# Patient Record
Sex: Female | Born: 1950
Health system: Southern US, Community
[De-identification: ages and names within clinical notes are randomized; demographics above are authoritative.]

## PROBLEM LIST (undated history)

## (undated) DIAGNOSIS — M810 Age-related osteoporosis without current pathological fracture: Secondary | ICD-10-CM

## (undated) DIAGNOSIS — M7740 Metatarsalgia, unspecified foot: Secondary | ICD-10-CM

## (undated) DIAGNOSIS — K219 Gastro-esophageal reflux disease without esophagitis: Secondary | ICD-10-CM

## (undated) DIAGNOSIS — M25559 Pain in unspecified hip: Secondary | ICD-10-CM

## (undated) DIAGNOSIS — G4733 Obstructive sleep apnea (adult) (pediatric): Secondary | ICD-10-CM

## (undated) DIAGNOSIS — Z9889 Other specified postprocedural states: Secondary | ICD-10-CM

## (undated) DIAGNOSIS — F32A Depression, unspecified: Secondary | ICD-10-CM

## (undated) DIAGNOSIS — F329 Major depressive disorder, single episode, unspecified: Secondary | ICD-10-CM

## (undated) DIAGNOSIS — L570 Actinic keratosis: Secondary | ICD-10-CM

## (undated) DIAGNOSIS — R0902 Hypoxemia: Secondary | ICD-10-CM

## (undated) DIAGNOSIS — F341 Dysthymic disorder: Secondary | ICD-10-CM

## (undated) DIAGNOSIS — R21 Rash and other nonspecific skin eruption: Secondary | ICD-10-CM

## (undated) DIAGNOSIS — Z9114 Patient's other noncompliance with medication regimen: Secondary | ICD-10-CM

## (undated) DIAGNOSIS — M25562 Pain in left knee: Secondary | ICD-10-CM

## (undated) DIAGNOSIS — M1990 Unspecified osteoarthritis, unspecified site: Secondary | ICD-10-CM

## (undated) DIAGNOSIS — M21612 Bunion of left foot: Secondary | ICD-10-CM

## (undated) DIAGNOSIS — R269 Unspecified abnormalities of gait and mobility: Secondary | ICD-10-CM

## (undated) DIAGNOSIS — M25561 Pain in right knee: Secondary | ICD-10-CM

## (undated) DIAGNOSIS — I499 Cardiac arrhythmia, unspecified: Secondary | ICD-10-CM

## (undated) DIAGNOSIS — I471 Supraventricular tachycardia: Secondary | ICD-10-CM

## (undated) DIAGNOSIS — F419 Anxiety disorder, unspecified: Secondary | ICD-10-CM

## (undated) DIAGNOSIS — C73 Malignant neoplasm of thyroid gland: Secondary | ICD-10-CM

## (undated) DIAGNOSIS — Z0000 Encounter for general adult medical examination without abnormal findings: Secondary | ICD-10-CM

## (undated) DIAGNOSIS — M5126 Other intervertebral disc displacement, lumbar region: Secondary | ICD-10-CM

## (undated) DIAGNOSIS — E079 Disorder of thyroid, unspecified: Secondary | ICD-10-CM

## (undated) DIAGNOSIS — R05 Cough: Secondary | ICD-10-CM

## (undated) DIAGNOSIS — C801 Malignant (primary) neoplasm, unspecified: Secondary | ICD-10-CM

## (undated) DIAGNOSIS — J309 Allergic rhinitis, unspecified: Secondary | ICD-10-CM

## (undated) DIAGNOSIS — R0683 Snoring: Secondary | ICD-10-CM

## (undated) DIAGNOSIS — Z833 Family history of diabetes mellitus: Secondary | ICD-10-CM

## (undated) DIAGNOSIS — R4189 Other symptoms and signs involving cognitive functions and awareness: Secondary | ICD-10-CM

## (undated) DIAGNOSIS — M999 Biomechanical lesion, unspecified: Secondary | ICD-10-CM

## (undated) DIAGNOSIS — Z9989 Dependence on other enabling machines and devices: Secondary | ICD-10-CM

## (undated) DIAGNOSIS — R112 Nausea with vomiting, unspecified: Secondary | ICD-10-CM

## (undated) DIAGNOSIS — T4145XA Adverse effect of unspecified anesthetic, initial encounter: Secondary | ICD-10-CM

## (undated) DIAGNOSIS — G8929 Other chronic pain: Secondary | ICD-10-CM

## (undated) DIAGNOSIS — R51 Headache: Secondary | ICD-10-CM

## (undated) DIAGNOSIS — L700 Acne vulgaris: Secondary | ICD-10-CM

## (undated) DIAGNOSIS — I498 Other specified cardiac arrhythmias: Secondary | ICD-10-CM

## (undated) DIAGNOSIS — M797 Fibromyalgia: Secondary | ICD-10-CM

## (undated) DIAGNOSIS — R519 Headache, unspecified: Secondary | ICD-10-CM

## (undated) DIAGNOSIS — R413 Other amnesia: Secondary | ICD-10-CM

## (undated) DIAGNOSIS — M87059 Idiopathic aseptic necrosis of unspecified femur: Secondary | ICD-10-CM

## (undated) DIAGNOSIS — R351 Nocturia: Secondary | ICD-10-CM

## (undated) DIAGNOSIS — T8859XA Other complications of anesthesia, initial encounter: Secondary | ICD-10-CM

## (undated) DIAGNOSIS — M21611 Bunion of right foot: Secondary | ICD-10-CM

## (undated) DIAGNOSIS — G4730 Sleep apnea, unspecified: Secondary | ICD-10-CM

## (undated) HISTORY — DX: Age-related osteoporosis without current pathological fracture: M81.0

## (undated) HISTORY — DX: Sleep apnea, unspecified: G47.30

## (undated) HISTORY — DX: Pain in left knee: M25.562

## (undated) HISTORY — DX: Bunion of left foot: M21.612

## (undated) HISTORY — PX: CATARACT EXTRACTION: SUR2

## (undated) HISTORY — DX: Malignant neoplasm of thyroid gland: C73

## (undated) HISTORY — DX: Obstructive sleep apnea (adult) (pediatric): G47.33

## (undated) HISTORY — PX: ABDOMINAL HYSTERECTOMY: SHX81

## (undated) HISTORY — DX: Other symptoms and signs involving cognitive functions and awareness: R41.89

## (undated) HISTORY — DX: Anxiety disorder, unspecified: F41.9

## (undated) HISTORY — DX: Biomechanical lesion, unspecified: M99.9

## (undated) HISTORY — DX: Hypoxemia: R09.02

## (undated) HISTORY — DX: Malignant (primary) neoplasm, unspecified: C80.1

## (undated) HISTORY — DX: Bunion of right foot: M21.611

## (undated) HISTORY — DX: Actinic keratosis: L57.0

## (undated) HISTORY — DX: Cough: R05

## (undated) HISTORY — DX: Idiopathic aseptic necrosis of unspecified femur: M87.059

## (undated) HISTORY — DX: Disorder of thyroid, unspecified: E07.9

## (undated) HISTORY — DX: Fibromyalgia: M79.7

## (undated) HISTORY — DX: Headache: R51

## (undated) HISTORY — DX: Nocturia: R35.1

## (undated) HISTORY — DX: Unspecified osteoarthritis, unspecified site: M19.90

## (undated) HISTORY — DX: Other specified postprocedural states: Z98.890

## (undated) HISTORY — DX: Pain in unspecified hip: M25.559

## (undated) HISTORY — DX: Major depressive disorder, single episode, unspecified: F32.9

## (undated) HISTORY — DX: Other amnesia: R41.3

## (undated) HISTORY — DX: Pain in right knee: M25.561

## (undated) HISTORY — DX: Depression, unspecified: F32.A

## (undated) HISTORY — PX: TOE SURGERY: SHX1073

## (undated) HISTORY — DX: Rash and other nonspecific skin eruption: R21

## (undated) HISTORY — PX: TOTAL HIP ARTHROPLASTY: SHX124

## (undated) HISTORY — DX: Patient's other noncompliance with medication regimen: Z91.14

## (undated) HISTORY — PX: CHOLECYSTECTOMY: SHX55

## (undated) HISTORY — DX: Encounter for general adult medical examination without abnormal findings: Z00.00

## (undated) HISTORY — DX: Dependence on other enabling machines and devices: Z99.89

## (undated) HISTORY — DX: Acne vulgaris: L70.0

## (undated) HISTORY — DX: Snoring: R06.83

## (undated) HISTORY — DX: Other chronic pain: G89.29

## (undated) HISTORY — DX: Dysthymic disorder: F34.1

## (undated) HISTORY — DX: Gastro-esophageal reflux disease without esophagitis: K21.9

## (undated) HISTORY — DX: Unspecified abnormalities of gait and mobility: R26.9

## (undated) HISTORY — PX: TUBAL LIGATION: SHX77

## (undated) HISTORY — DX: Headache, unspecified: R51.9

## (undated) HISTORY — PX: BREAST EXCISIONAL BIOPSY: SUR124

## (undated) HISTORY — DX: Other specified cardiac arrhythmias: I49.8

## (undated) HISTORY — DX: Nausea with vomiting, unspecified: R11.2

## (undated) HISTORY — DX: Supraventricular tachycardia: I47.1

## (undated) HISTORY — DX: Metatarsalgia, unspecified foot: M77.40

## (undated) HISTORY — DX: Other intervertebral disc displacement, lumbar region: M51.26

## (undated) HISTORY — DX: Family history of diabetes mellitus: Z83.3

## (undated) HISTORY — DX: Morbid (severe) obesity due to excess calories: E66.01

## (undated) HISTORY — DX: Allergic rhinitis, unspecified: J30.9

---

## 1998-08-25 ENCOUNTER — Ambulatory Visit (HOSPITAL_BASED_OUTPATIENT_CLINIC_OR_DEPARTMENT_OTHER): Admission: RE | Admit: 1998-08-25 | Discharge: 1998-08-25 | Payer: Self-pay | Admitting: Surgery

## 1998-08-25 ENCOUNTER — Encounter: Payer: Self-pay | Admitting: Surgery

## 1999-06-21 ENCOUNTER — Ambulatory Visit (HOSPITAL_BASED_OUTPATIENT_CLINIC_OR_DEPARTMENT_OTHER): Admission: RE | Admit: 1999-06-21 | Discharge: 1999-06-21 | Payer: Self-pay | Admitting: Orthopaedic Surgery

## 2001-09-14 ENCOUNTER — Other Ambulatory Visit: Admission: RE | Admit: 2001-09-14 | Discharge: 2001-09-14 | Payer: Self-pay | Admitting: *Deleted

## 2006-01-30 ENCOUNTER — Ambulatory Visit: Payer: Self-pay | Admitting: Family Medicine

## 2006-03-10 ENCOUNTER — Encounter: Admission: RE | Admit: 2006-03-10 | Discharge: 2006-03-10 | Payer: Self-pay | Admitting: Sports Medicine

## 2006-03-18 ENCOUNTER — Ambulatory Visit: Payer: Self-pay | Admitting: Family Medicine

## 2006-08-26 ENCOUNTER — Ambulatory Visit: Payer: Self-pay | Admitting: Sports Medicine

## 2006-10-16 ENCOUNTER — Ambulatory Visit: Payer: Self-pay | Admitting: Sports Medicine

## 2007-03-03 ENCOUNTER — Ambulatory Visit: Payer: Self-pay | Admitting: Sports Medicine

## 2007-04-10 ENCOUNTER — Encounter: Payer: Self-pay | Admitting: Internal Medicine

## 2007-04-15 ENCOUNTER — Encounter: Payer: Self-pay | Admitting: Internal Medicine

## 2007-04-30 ENCOUNTER — Encounter: Payer: Self-pay | Admitting: Internal Medicine

## 2007-05-28 ENCOUNTER — Encounter: Payer: Self-pay | Admitting: Family Medicine

## 2007-07-14 ENCOUNTER — Ambulatory Visit: Payer: Self-pay | Admitting: Family Medicine

## 2007-07-14 DIAGNOSIS — K219 Gastro-esophageal reflux disease without esophagitis: Secondary | ICD-10-CM

## 2007-07-14 DIAGNOSIS — IMO0001 Reserved for inherently not codable concepts without codable children: Secondary | ICD-10-CM | POA: Insufficient documentation

## 2007-07-14 HISTORY — DX: Gastro-esophageal reflux disease without esophagitis: K21.9

## 2007-07-16 LAB — CONVERTED CEMR LAB
ALT: 26 units/L (ref 0–35)
AST: 26 units/L (ref 0–37)
Albumin: 4.2 g/dL (ref 3.5–5.2)
Alkaline Phosphatase: 63 units/L (ref 39–117)
BUN: 13 mg/dL (ref 6–23)
CO2: 25 meq/L (ref 19–32)
Calcium: 8.9 mg/dL (ref 8.4–10.5)
Chloride: 104 meq/L (ref 96–112)
Creatinine, Ser: 0.69 mg/dL (ref 0.40–1.20)
Digitoxin Lvl: 0.9 ng/mL (ref 0.8–2.0)
Glucose, Bld: 97 mg/dL (ref 70–99)
HCT: 43.5 % (ref 36.0–46.0)
Hemoglobin: 13.7 g/dL (ref 12.0–15.0)
MCHC: 31.5 g/dL (ref 30.0–36.0)
MCV: 95 fL (ref 78.0–100.0)
Platelets: 304 10*3/uL (ref 150–400)
Potassium: 4.1 meq/L (ref 3.5–5.3)
RBC: 4.58 M/uL (ref 3.87–5.11)
RDW: 14.2 % — ABNORMAL HIGH (ref 11.5–14.0)
Sed Rate: 8 mm/hr (ref 0–22)
Sodium: 140 meq/L (ref 135–145)
TSH: 1.893 microintl units/mL (ref 0.350–5.50)
Total Bilirubin: 0.5 mg/dL (ref 0.3–1.2)
Total Protein: 6.9 g/dL (ref 6.0–8.3)
Vitamin B-12: 647 pg/mL (ref 211–911)
WBC: 6.8 10*3/uL (ref 4.0–10.5)

## 2007-08-03 ENCOUNTER — Encounter: Payer: Self-pay | Admitting: Family Medicine

## 2007-08-11 ENCOUNTER — Encounter (INDEPENDENT_AMBULATORY_CARE_PROVIDER_SITE_OTHER): Payer: Self-pay | Admitting: *Deleted

## 2007-08-11 ENCOUNTER — Encounter: Admission: RE | Admit: 2007-08-11 | Discharge: 2007-08-11 | Payer: Self-pay | Admitting: Family Medicine

## 2007-08-11 DIAGNOSIS — M87059 Idiopathic aseptic necrosis of unspecified femur: Secondary | ICD-10-CM

## 2007-08-11 HISTORY — DX: Idiopathic aseptic necrosis of unspecified femur: M87.059

## 2007-08-13 ENCOUNTER — Encounter: Admission: RE | Admit: 2007-08-13 | Discharge: 2007-08-13 | Payer: Self-pay | Admitting: Sports Medicine

## 2007-08-14 ENCOUNTER — Encounter: Payer: Self-pay | Admitting: Family Medicine

## 2007-08-14 ENCOUNTER — Encounter: Admission: RE | Admit: 2007-08-14 | Discharge: 2007-08-14 | Payer: Self-pay | Admitting: Internal Medicine

## 2007-08-14 ENCOUNTER — Encounter (INDEPENDENT_AMBULATORY_CARE_PROVIDER_SITE_OTHER): Payer: Self-pay | Admitting: Internal Medicine

## 2007-09-08 ENCOUNTER — Ambulatory Visit: Payer: Self-pay | Admitting: Sports Medicine

## 2007-09-30 ENCOUNTER — Encounter: Payer: Self-pay | Admitting: Sports Medicine

## 2007-10-28 ENCOUNTER — Encounter: Payer: Self-pay | Admitting: Family Medicine

## 2007-11-06 ENCOUNTER — Encounter: Admission: RE | Admit: 2007-11-06 | Discharge: 2007-11-06 | Payer: Self-pay | Admitting: Family Medicine

## 2007-11-09 ENCOUNTER — Encounter: Payer: Self-pay | Admitting: Family Medicine

## 2007-12-22 ENCOUNTER — Ambulatory Visit: Payer: Self-pay | Admitting: Sports Medicine

## 2008-01-20 ENCOUNTER — Encounter: Payer: Self-pay | Admitting: *Deleted

## 2008-02-02 ENCOUNTER — Encounter: Payer: Self-pay | Admitting: Family Medicine

## 2008-02-17 ENCOUNTER — Encounter: Payer: Self-pay | Admitting: Family Medicine

## 2008-03-07 ENCOUNTER — Encounter: Payer: Self-pay | Admitting: Family Medicine

## 2008-03-17 ENCOUNTER — Encounter: Payer: Self-pay | Admitting: Family Medicine

## 2008-03-21 ENCOUNTER — Encounter: Payer: Self-pay | Admitting: Family Medicine

## 2008-04-04 ENCOUNTER — Encounter: Payer: Self-pay | Admitting: *Deleted

## 2008-04-11 ENCOUNTER — Ambulatory Visit (HOSPITAL_COMMUNITY): Admission: RE | Admit: 2008-04-11 | Discharge: 2008-04-11 | Payer: Self-pay | Admitting: Family Medicine

## 2008-04-12 ENCOUNTER — Encounter: Payer: Self-pay | Admitting: Family Medicine

## 2008-04-14 ENCOUNTER — Encounter: Payer: Self-pay | Admitting: Family Medicine

## 2008-04-20 ENCOUNTER — Ambulatory Visit (HOSPITAL_COMMUNITY): Admission: RE | Admit: 2008-04-20 | Discharge: 2008-04-20 | Payer: Self-pay | Admitting: Orthopedic Surgery

## 2008-06-21 ENCOUNTER — Encounter: Payer: Self-pay | Admitting: Family Medicine

## 2008-06-22 ENCOUNTER — Encounter: Payer: Self-pay | Admitting: Family Medicine

## 2008-06-27 ENCOUNTER — Inpatient Hospital Stay (HOSPITAL_COMMUNITY): Admission: RE | Admit: 2008-06-27 | Discharge: 2008-06-30 | Payer: Self-pay | Admitting: Orthopedic Surgery

## 2008-08-11 ENCOUNTER — Encounter: Admission: RE | Admit: 2008-08-11 | Discharge: 2008-08-11 | Payer: Self-pay | Admitting: Family Medicine

## 2008-09-19 ENCOUNTER — Encounter: Payer: Self-pay | Admitting: Family Medicine

## 2008-09-19 ENCOUNTER — Encounter: Admission: RE | Admit: 2008-09-19 | Discharge: 2008-09-19 | Payer: Self-pay | Admitting: Family Medicine

## 2008-10-04 ENCOUNTER — Ambulatory Visit: Payer: Self-pay | Admitting: Family Medicine

## 2008-10-13 ENCOUNTER — Encounter: Payer: Self-pay | Admitting: Family Medicine

## 2008-10-14 ENCOUNTER — Encounter: Payer: Self-pay | Admitting: *Deleted

## 2008-11-23 ENCOUNTER — Encounter: Payer: Self-pay | Admitting: Family Medicine

## 2009-01-30 ENCOUNTER — Ambulatory Visit: Payer: Self-pay | Admitting: Family Medicine

## 2009-01-30 DIAGNOSIS — J309 Allergic rhinitis, unspecified: Secondary | ICD-10-CM

## 2009-01-30 HISTORY — DX: Allergic rhinitis, unspecified: J30.9

## 2009-01-30 LAB — CONVERTED CEMR LAB
IgE (Immunoglobulin E), Serum: 58.9 intl units/mL (ref 0.0–180.0)
TSH: 0.624 microintl units/mL (ref 0.350–4.500)

## 2009-02-14 ENCOUNTER — Ambulatory Visit: Payer: Self-pay | Admitting: Family Medicine

## 2009-02-14 DIAGNOSIS — G4459 Other complicated headache syndrome: Secondary | ICD-10-CM | POA: Insufficient documentation

## 2009-02-17 ENCOUNTER — Ambulatory Visit (HOSPITAL_COMMUNITY): Admission: RE | Admit: 2009-02-17 | Discharge: 2009-02-17 | Payer: Self-pay | Admitting: Family Medicine

## 2009-02-21 ENCOUNTER — Encounter: Payer: Self-pay | Admitting: Family Medicine

## 2009-02-21 ENCOUNTER — Ambulatory Visit: Payer: Self-pay | Admitting: Family Medicine

## 2009-02-21 ENCOUNTER — Ambulatory Visit (HOSPITAL_COMMUNITY): Admission: RE | Admit: 2009-02-21 | Discharge: 2009-02-21 | Payer: Self-pay | Admitting: Family Medicine

## 2009-02-21 LAB — CONVERTED CEMR LAB
Basophils Absolute: 0 10*3/uL (ref 0.0–0.1)
Basophils Relative: 0 % (ref 0–1)
Eosinophils Absolute: 0.1 10*3/uL (ref 0.0–0.7)
Eosinophils Relative: 2 % (ref 0–5)
HCT: 37.3 % (ref 36.0–46.0)
Hemoglobin: 12.7 g/dL (ref 12.0–15.0)
Lymphocytes Relative: 28 % (ref 12–46)
Lymphs Abs: 1.4 10*3/uL (ref 0.7–4.0)
MCHC: 34 g/dL (ref 30.0–36.0)
MCV: 87.1 fL (ref 78.0–100.0)
Monocytes Absolute: 0.8 10*3/uL (ref 0.1–1.0)
Monocytes Relative: 17 % — ABNORMAL HIGH (ref 3–12)
Neutro Abs: 2.7 10*3/uL (ref 1.7–7.7)
Neutrophils Relative %: 53 % (ref 43–77)
Platelets: 231 10*3/uL (ref 150–400)
RBC: 4.28 M/uL (ref 3.87–5.11)
RDW: 14.4 % (ref 11.5–15.5)
WBC: 5.1 10*3/uL (ref 4.0–10.5)

## 2009-02-22 ENCOUNTER — Ambulatory Visit: Payer: Self-pay | Admitting: Family Medicine

## 2009-02-22 ENCOUNTER — Encounter: Payer: Self-pay | Admitting: Family Medicine

## 2009-02-22 ENCOUNTER — Observation Stay (HOSPITAL_COMMUNITY): Admission: AD | Admit: 2009-02-22 | Discharge: 2009-02-23 | Payer: Self-pay | Admitting: Family Medicine

## 2009-02-28 ENCOUNTER — Encounter: Payer: Self-pay | Admitting: *Deleted

## 2009-03-03 ENCOUNTER — Encounter: Payer: Self-pay | Admitting: Family Medicine

## 2009-03-08 ENCOUNTER — Ambulatory Visit (HOSPITAL_COMMUNITY): Admission: RE | Admit: 2009-03-08 | Discharge: 2009-03-08 | Payer: Self-pay | Admitting: Otolaryngology

## 2009-03-15 ENCOUNTER — Ambulatory Visit (HOSPITAL_COMMUNITY): Admission: RE | Admit: 2009-03-15 | Discharge: 2009-03-15 | Payer: Self-pay | Admitting: Otolaryngology

## 2009-03-15 ENCOUNTER — Encounter (INDEPENDENT_AMBULATORY_CARE_PROVIDER_SITE_OTHER): Payer: Self-pay | Admitting: Diagnostic Radiology

## 2009-04-11 ENCOUNTER — Encounter: Payer: Self-pay | Admitting: Family Medicine

## 2009-04-23 HISTORY — PX: THYROIDECTOMY: SHX17

## 2009-04-26 ENCOUNTER — Encounter (INDEPENDENT_AMBULATORY_CARE_PROVIDER_SITE_OTHER): Payer: Self-pay | Admitting: Otolaryngology

## 2009-04-26 ENCOUNTER — Ambulatory Visit (HOSPITAL_COMMUNITY): Admission: RE | Admit: 2009-04-26 | Discharge: 2009-04-26 | Payer: Self-pay | Admitting: Otolaryngology

## 2009-04-27 ENCOUNTER — Encounter: Payer: Self-pay | Admitting: Family Medicine

## 2009-05-03 ENCOUNTER — Encounter: Payer: Self-pay | Admitting: Family Medicine

## 2009-05-08 ENCOUNTER — Encounter: Payer: Self-pay | Admitting: Family Medicine

## 2009-05-11 ENCOUNTER — Ambulatory Visit (HOSPITAL_COMMUNITY): Admission: RE | Admit: 2009-05-11 | Discharge: 2009-05-12 | Payer: Self-pay | Admitting: Otolaryngology

## 2009-05-11 ENCOUNTER — Encounter (INDEPENDENT_AMBULATORY_CARE_PROVIDER_SITE_OTHER): Payer: Self-pay | Admitting: Otolaryngology

## 2009-05-16 ENCOUNTER — Encounter: Payer: Self-pay | Admitting: Family Medicine

## 2009-05-17 DIAGNOSIS — C73 Malignant neoplasm of thyroid gland: Secondary | ICD-10-CM

## 2009-05-17 HISTORY — DX: Malignant neoplasm of thyroid gland: C73

## 2009-05-19 ENCOUNTER — Encounter: Payer: Self-pay | Admitting: Family Medicine

## 2009-06-12 ENCOUNTER — Encounter: Payer: Self-pay | Admitting: Family Medicine

## 2009-06-14 ENCOUNTER — Encounter: Payer: Self-pay | Admitting: Family Medicine

## 2009-06-28 ENCOUNTER — Encounter (HOSPITAL_COMMUNITY): Admission: RE | Admit: 2009-06-28 | Discharge: 2009-09-19 | Payer: Self-pay | Admitting: Internal Medicine

## 2009-07-28 ENCOUNTER — Ambulatory Visit (HOSPITAL_COMMUNITY): Admission: RE | Admit: 2009-07-28 | Discharge: 2009-07-28 | Payer: Self-pay | Admitting: Orthopedic Surgery

## 2009-08-08 ENCOUNTER — Ambulatory Visit (HOSPITAL_COMMUNITY): Admission: RE | Admit: 2009-08-08 | Discharge: 2009-08-08 | Payer: Self-pay | Admitting: Orthopedic Surgery

## 2009-08-23 ENCOUNTER — Encounter: Admission: RE | Admit: 2009-08-23 | Discharge: 2009-08-23 | Payer: Self-pay | Admitting: Family Medicine

## 2009-08-24 ENCOUNTER — Encounter: Payer: Self-pay | Admitting: Family Medicine

## 2009-08-25 ENCOUNTER — Encounter: Payer: Self-pay | Admitting: Family Medicine

## 2009-08-28 ENCOUNTER — Encounter: Payer: Self-pay | Admitting: Family Medicine

## 2009-08-28 LAB — CONVERTED CEMR LAB: CRP, High Sensitivity: 3.2 — ABNORMAL HIGH

## 2009-09-14 ENCOUNTER — Ambulatory Visit: Payer: Self-pay | Admitting: Family Medicine

## 2009-09-14 LAB — CONVERTED CEMR LAB: Digitoxin Lvl: 1.1 ng/mL (ref 0.8–2.0)

## 2009-09-20 ENCOUNTER — Encounter: Payer: Self-pay | Admitting: Internal Medicine

## 2009-09-20 ENCOUNTER — Encounter: Payer: Self-pay | Admitting: Family Medicine

## 2009-09-20 ENCOUNTER — Ambulatory Visit: Payer: Self-pay | Admitting: Family Medicine

## 2009-09-20 ENCOUNTER — Ambulatory Visit (HOSPITAL_COMMUNITY): Admission: RE | Admit: 2009-09-20 | Discharge: 2009-09-20 | Payer: Self-pay | Admitting: Family Medicine

## 2009-09-20 LAB — CONVERTED CEMR LAB
ALT: 16 units/L (ref 0–35)
AST: 19 units/L (ref 0–37)
Albumin: 4.4 g/dL (ref 3.5–5.2)
Alkaline Phosphatase: 70 units/L (ref 39–117)
BUN: 15 mg/dL (ref 6–23)
CO2: 26 meq/L (ref 19–32)
Calcium: 8.5 mg/dL (ref 8.4–10.5)
Chloride: 104 meq/L (ref 96–112)
Creatinine, Ser: 0.83 mg/dL (ref 0.40–1.20)
Glucose, Bld: 115 mg/dL — ABNORMAL HIGH (ref 70–99)
Potassium: 4.8 meq/L (ref 3.5–5.3)
Sodium: 141 meq/L (ref 135–145)
TSH: 0.185 microintl units/mL — ABNORMAL LOW (ref 0.350–4.500)
Total Bilirubin: 0.4 mg/dL (ref 0.3–1.2)
Total Protein: 6.9 g/dL (ref 6.0–8.3)

## 2009-09-29 ENCOUNTER — Encounter: Payer: Self-pay | Admitting: Family Medicine

## 2009-10-04 ENCOUNTER — Ambulatory Visit: Payer: Self-pay | Admitting: Internal Medicine

## 2009-10-04 ENCOUNTER — Ambulatory Visit: Payer: Self-pay | Admitting: Family Medicine

## 2009-10-11 ENCOUNTER — Encounter: Payer: Self-pay | Admitting: Family Medicine

## 2009-10-12 ENCOUNTER — Encounter: Payer: Self-pay | Admitting: Family Medicine

## 2009-10-12 LAB — CONVERTED CEMR LAB: Cortisol - AM: 6.4 ug/dL (ref 4.3–22.4)

## 2009-10-31 ENCOUNTER — Encounter: Payer: Self-pay | Admitting: Family Medicine

## 2009-11-01 ENCOUNTER — Encounter: Payer: Self-pay | Admitting: Family Medicine

## 2009-11-09 ENCOUNTER — Encounter: Payer: Self-pay | Admitting: Family Medicine

## 2009-11-09 LAB — CONVERTED CEMR LAB
TSH: 0.382 microintl units/mL (ref 0.350–4.500)
Thyroglobulin Ab: 30.8 (ref 0.0–60.0)

## 2009-11-13 ENCOUNTER — Encounter: Payer: Self-pay | Admitting: Family Medicine

## 2009-11-14 ENCOUNTER — Encounter: Payer: Self-pay | Admitting: Family Medicine

## 2009-11-14 ENCOUNTER — Ambulatory Visit: Payer: Self-pay | Admitting: Internal Medicine

## 2009-11-17 ENCOUNTER — Ambulatory Visit (HOSPITAL_COMMUNITY): Admission: RE | Admit: 2009-11-17 | Discharge: 2009-11-17 | Payer: Self-pay | Admitting: Endocrinology

## 2009-12-07 ENCOUNTER — Telehealth: Payer: Self-pay | Admitting: Internal Medicine

## 2009-12-11 ENCOUNTER — Encounter: Payer: Self-pay | Admitting: Internal Medicine

## 2010-01-03 ENCOUNTER — Ambulatory Visit: Payer: Self-pay | Admitting: Family Medicine

## 2010-01-08 LAB — CONVERTED CEMR LAB
TSH: 0.44 microintl units/mL (ref 0.350–4.500)
Thyroglobulin Ab: <30 (ref 0.0–60.0)
Thyroperoxidase Ab SerPl-aCnc: 287.5 — ABNORMAL HIGH (ref 0.0–60.0)

## 2010-01-09 ENCOUNTER — Encounter: Payer: Self-pay | Admitting: Family Medicine

## 2010-01-11 ENCOUNTER — Ambulatory Visit: Payer: Self-pay | Admitting: Internal Medicine

## 2010-01-15 ENCOUNTER — Telehealth (INDEPENDENT_AMBULATORY_CARE_PROVIDER_SITE_OTHER): Payer: Self-pay

## 2010-01-16 ENCOUNTER — Ambulatory Visit: Payer: Self-pay

## 2010-01-16 ENCOUNTER — Ambulatory Visit: Payer: Self-pay | Admitting: Cardiology

## 2010-01-16 ENCOUNTER — Encounter (INDEPENDENT_AMBULATORY_CARE_PROVIDER_SITE_OTHER): Payer: Self-pay | Admitting: *Deleted

## 2010-01-16 ENCOUNTER — Encounter (HOSPITAL_COMMUNITY): Admission: RE | Admit: 2010-01-16 | Discharge: 2010-03-23 | Payer: Self-pay | Admitting: Internal Medicine

## 2010-01-18 ENCOUNTER — Telehealth: Payer: Self-pay | Admitting: Internal Medicine

## 2010-01-18 ENCOUNTER — Encounter: Payer: Self-pay | Admitting: Internal Medicine

## 2010-01-18 ENCOUNTER — Ambulatory Visit: Payer: Self-pay | Admitting: Family Medicine

## 2010-01-18 LAB — CONVERTED CEMR LAB
HCT: 39.8 % (ref 36.0–46.0)
Hemoglobin: 12.9 g/dL (ref 12.0–15.0)
MCHC: 32.4 g/dL (ref 30.0–36.0)
MCV: 91.5 fL (ref 78.0–100.0)
Platelets: 233 10*3/uL (ref 150–400)
RBC: 4.35 M/uL (ref 3.87–5.11)
RDW: 14 % (ref 11.5–15.5)
Vitamin B-12: 612 pg/mL (ref 211–911)
WBC: 4.6 10*3/uL (ref 4.0–10.5)

## 2010-02-20 ENCOUNTER — Encounter (INDEPENDENT_AMBULATORY_CARE_PROVIDER_SITE_OTHER): Payer: Self-pay | Admitting: Family Medicine

## 2010-02-23 ENCOUNTER — Ambulatory Visit: Payer: Self-pay | Admitting: Family Medicine

## 2010-03-08 ENCOUNTER — Ambulatory Visit (HOSPITAL_COMMUNITY): Admission: RE | Admit: 2010-03-08 | Discharge: 2010-03-08 | Payer: Self-pay | Admitting: Orthopedic Surgery

## 2010-04-18 ENCOUNTER — Ambulatory Visit (HOSPITAL_COMMUNITY): Admission: RE | Admit: 2010-04-18 | Discharge: 2010-04-18 | Payer: Self-pay | Admitting: Orthopedic Surgery

## 2010-06-19 ENCOUNTER — Encounter: Payer: Self-pay | Admitting: Family Medicine

## 2010-06-25 ENCOUNTER — Encounter: Payer: Self-pay | Admitting: Family Medicine

## 2010-07-04 ENCOUNTER — Ambulatory Visit: Payer: Self-pay | Admitting: Family Medicine

## 2010-07-05 LAB — CONVERTED CEMR LAB
ALT: 17 units/L (ref 0–35)
AST: 19 units/L (ref 0–37)
Albumin: 4.1 g/dL (ref 3.5–5.2)
Alkaline Phosphatase: 51 units/L (ref 39–117)
BUN: 26 mg/dL — ABNORMAL HIGH (ref 6–23)
CO2: 27 meq/L (ref 19–32)
Calcium: 8.6 mg/dL (ref 8.4–10.5)
Chloride: 103 meq/L (ref 96–112)
Creatinine, Ser: 0.76 mg/dL (ref 0.40–1.20)
Direct LDL: 121 mg/dL — ABNORMAL HIGH
Glucose, Bld: 87 mg/dL (ref 70–99)
HCT: 34.8 % — ABNORMAL LOW (ref 36.0–46.0)
Hemoglobin: 11.2 g/dL — ABNORMAL LOW (ref 12.0–15.0)
MCHC: 32.2 g/dL (ref 30.0–36.0)
MCV: 92.6 fL (ref 78.0–100.0)
Platelets: 248 10*3/uL (ref 150–400)
Potassium: 5 meq/L (ref 3.5–5.3)
RBC: 3.76 M/uL — ABNORMAL LOW (ref 3.87–5.11)
RDW: 15.1 % (ref 11.5–15.5)
Sodium: 140 meq/L (ref 135–145)
TSH: 13.882 microintl units/mL — ABNORMAL HIGH (ref 0.350–4.500)
Total Bilirubin: 0.3 mg/dL (ref 0.3–1.2)
Total Protein: 6.3 g/dL (ref 6.0–8.3)
WBC: 4.9 10*3/uL (ref 4.0–10.5)

## 2010-07-09 ENCOUNTER — Encounter: Payer: Self-pay | Admitting: Family Medicine

## 2010-07-09 ENCOUNTER — Telehealth (INDEPENDENT_AMBULATORY_CARE_PROVIDER_SITE_OTHER): Payer: Self-pay | Admitting: *Deleted

## 2010-07-13 ENCOUNTER — Inpatient Hospital Stay (HOSPITAL_COMMUNITY): Admission: RE | Admit: 2010-07-13 | Discharge: 2010-07-17 | Payer: Self-pay | Admitting: Orthopedic Surgery

## 2010-07-13 ENCOUNTER — Encounter (INDEPENDENT_AMBULATORY_CARE_PROVIDER_SITE_OTHER): Payer: Self-pay | Admitting: Orthopedic Surgery

## 2010-08-28 ENCOUNTER — Encounter
Admission: RE | Admit: 2010-08-28 | Discharge: 2010-08-28 | Payer: Self-pay | Source: Home / Self Care | Admitting: Family Medicine

## 2010-09-03 ENCOUNTER — Ambulatory Visit: Payer: Self-pay | Admitting: Family Medicine

## 2010-09-03 DIAGNOSIS — F341 Dysthymic disorder: Secondary | ICD-10-CM

## 2010-09-03 HISTORY — DX: Dysthymic disorder: F34.1

## 2010-09-21 ENCOUNTER — Encounter: Payer: Self-pay | Admitting: Family Medicine

## 2010-09-21 ENCOUNTER — Ambulatory Visit (HOSPITAL_COMMUNITY)
Admission: RE | Admit: 2010-09-21 | Discharge: 2010-09-21 | Payer: Self-pay | Source: Home / Self Care | Attending: Family Medicine | Admitting: Family Medicine

## 2010-10-11 ENCOUNTER — Encounter: Payer: Self-pay | Admitting: Family Medicine

## 2010-10-14 ENCOUNTER — Encounter: Payer: Self-pay | Admitting: Orthopedic Surgery

## 2010-10-14 ENCOUNTER — Encounter: Payer: Self-pay | Admitting: Internal Medicine

## 2010-10-15 ENCOUNTER — Encounter: Payer: Self-pay | Admitting: Otolaryngology

## 2010-10-15 ENCOUNTER — Encounter: Payer: Self-pay | Admitting: Family Medicine

## 2010-10-23 ENCOUNTER — Encounter: Payer: Self-pay | Admitting: Family Medicine

## 2010-10-25 NOTE — Consult Note (Signed)
Summary: [facility]   Imported By: De Nurse 07/27/2009 11:40:39  _____________________________________________________________________  External Attachment:    Type:   Image     Comment:   External Document

## 2010-10-25 NOTE — Consult Note (Signed)
Summary: [facility]   Imported By: Clydell Hakim 12/12/2009 14:41:24  _____________________________________________________________________  External Attachment:    Type:   Image     Comment:   External Document

## 2010-10-25 NOTE — Miscellaneous (Signed)
  Clinical Lists Changes  Orders: Added new Test order of Digoxin-[facility] [phone]) - Signed    Had pre-syncopal episode at breakfast this am--was reading newspaper, seated, got dizzy and fell out of chair. Boyfriend  had difficulty finding pulse initially, then found one and it seemed irregular by report, maybe slow. She recovered in a few minutes and has no sequelae. Will get dig level, her TSH checked 2 weeks ago. She will call her cardiologist for appt asap. Precautions given--she did have oms short aura so I think Ok to drive but only as needed.  Complete Medication List: 1)  Allegra 180 Mg Tabs (Fexofenadine hcl) .... Take 1 tablet by mouth once a day 2)  Nexium 40 Mg Cpdr (Esomeprazole magnesium) .... Take 1 capsule once a day 3)  Lanoxin 0.25 Mg Tabs (Digoxin) .Marland Kitchen.. 1 by mouth once daily 4)  Celexa 40 Mg Tabs (Citalopram hydrobromide) .Marland Kitchen.. 1 by mouth qd 5)  Flexeril 10 Mg Tabs (Cyclobenzaprine hcl) .Marland Kitchen.. 1 by mouth two times a day or three times a day 6)  Trazodone Hcl 100 Mg Tabs (Trazodone hcl) .... 2 by mouth at bedtime 7)  Bayer Childrens Aspirin 81 Mg Chew (Aspirin) .... Once daily 8)  Relpax 40 Mg Tabs (Eletriptan hydrobromide) .Marland Kitchen.. 1 at beginniing of headache and may repeat once in 2 hours, max 80 mg in 24 hours 9)  Fosamax 70 Mg Tabs (Alendronate sodium) .Marland Kitchen.. 1 by mouth qweek 10)  Miralax Powd (Polyethylene glycol 3350) .Marland Kitchen.. 1 scoop once daily disp 1 month or 3 months 11)  Wellbutrin Xl 150 Mg Xr24h-tab (Bupropion hcl) .Marland Kitchen.. 1 by mouth qd

## 2010-10-25 NOTE — Consult Note (Signed)
Summary: [facility]  [facility]   Imported By: Clydell Hakim 03/06/2009 15:31:37  _____________________________________________________________________  External Attachment:    Type:   Image     Comment:   External Document

## 2010-10-25 NOTE — Consult Note (Signed)
Summary: [facility]  [facility]   Imported By: Clydell Hakim 04/28/2009 16:14:04  _____________________________________________________________________  External Attachment:    Type:   Image     Comment:   External Document

## 2010-10-25 NOTE — Assessment & Plan Note (Signed)
Summary: shortness of breath/ls    Complete Medication List: 1)  Allegra 180 Mg Tabs (Fexofenadine hcl) .... Take 1 tablet by mouth once a day 2)  Nexium 40 Mg Cpdr (Esomeprazole magnesium) .... Take 1 capsule once a day 3)  Lanoxin 0.25 Mg Tabs (Digoxin) .Marland Kitchen.. 1 by mouth once daily 4)  Celexa 40 Mg Tabs (Citalopram hydrobromide) .Marland Kitchen.. 1 by mouth qd 5)  Flexeril 10 Mg Tabs (Cyclobenzaprine hcl) .Marland Kitchen.. 1 by mouth two times a day or three times a day 6)  Trazodone Hcl 100 Mg Tabs (Trazodone hcl) .... 2 by mouth at bedtime 7)  Bayer Childrens Aspirin 81 Mg Chew (Aspirin) .... Once daily 8)  Relpax 40 Mg Tabs (Eletriptan hydrobromide) .Marland Kitchen.. 1 at beginniing of headache and may repeat once in 2 hours, max 80 mg in 24 hours 9)  Fosamax 70 Mg Tabs (Alendronate sodium) .Marland Kitchen.. 1 by mouth qweek 10)  Miralax Powd (Polyethylene glycol 3350) .Marland Kitchen.. 1 scoop once daily disp 1 month or 3 months 11)  Ventolin Hfa 108 (90 Base) Mcg/act Aers (Albuterol sulfate) .... 2 puffs three times a day as needed disp 1 mdi 12)  Azithromycin 250 Mg Tabs (Azithromycin) .Marland Kitchen.. 1 by mouth once daily for four days start june 2 13)  Prednisone 50 Mg Tabs (Prednisone) .Marland Kitchen.. 1 by mouth once daily for five days  Other Orders: Morphine Sulfate inj 10 mg (J2270) Lorazepam 2mg  Injection (E4540) Albuterol Sulfate Sol 1mg  unit dose (J8119) Atrovent 1mg  (Neb) (J4782)   Medication Administration  Injection # 1:    Medication: Morphine Sulfate inj 10 mg    Diagnosis: ACUTE BRONCHOSPASM (ICD-519.11)    Route: IM    Site: RUOQ gluteus    Exp Date: 04/23/2010    Lot #: [id number]    Mfr: hospira    Comments: pt given 1 mg    Patient tolerated injection without complications    Given by: Alphia Kava (February 22, 2009 3:37 PM)  Injection # 2:    Medication: Lorazepam 2mg  Injection    Diagnosis: ACUTE BRONCHOSPASM (ICD-519.11)    Route: IM    Site: LUOQ gluteus    Exp Date: 08/2010    Lot #: [id number]    Mfr: novaplus    Comments: pt given 1  mg    Patient tolerated injection without complications    Given by: Alphia Kava (February 22, 2009 3:38 PM)  Medication # 1:    Medication: Albuterol Sulfate Sol 1mg  unit dose    Diagnosis: ACUTE BRONCHOSPASM (ICD-519.11)    Dose: 2.5 mg    Route: inhaled    Exp Date: 07/2010    Lot #: [id number]    Mfr: nephron    Patient tolerated medication without complications    Given by: Alphia Kava (February 22, 2009 3:43 PM)  Medication # 2:    Medication: Atrovent 1mg  (Neb)    Diagnosis: ACUTE BRONCHOSPASM (ICD-519.11)    Dose: 0.02/0.5    Route: inhaled    Exp Date: 12/23/1999    Lot #: [id number]    Mfr: nephron    Patient tolerated medication without complications    Given by: Alphia Kava (February 22, 2009 3:45 PM)  Orders Added: 1)  Morphine Sulfate inj 10 mg [J2270] 2)  Lorazepam 2mg  Injection [J2060] 3)  Albuterol Sulfate Sol 1mg  unit dose [J7613] 4)  Atrovent 1mg  (Neb) [G4010]

## 2010-10-25 NOTE — Letter (Signed)
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  She has had progressive left hip pain and functional limitation. CT shows impinging spurs from acetabulum.  Procedures Requested: Evaluate as to whether she is a candidate for any hip remodeling or FAI type of surgery versus hip replacement.  Current Medical Problems: 1)  METATARSALGIA (ICD-726.70) 2)  HIP PAIN, LEFT (ICD-719.45) 3)  WELL ADULT EXAM (ICD-V70.0) 4)  GERD (ICD-530.81) 5)  DYSRHYTHMIA, CARDIAC NEC (ICD-427.89) 6)  FIBROMYALGIA (ICD-729.1) 7)  AFTERCARE, LONG-TERM USE, MEDICATIONS NEC (ICD-V58.69)   Current Medications: 1)  ALLEGRA 180 MG TABS (FEXOFENADINE HCL) Take 1 tablet by mouth once a day 2)  NEXIUM 40 MG CPDR (ESOMEPRAZOLE MAGNESIUM) Take 1 capsule once a day 3)  LANOXIN 0.25 MG  TABS (DIGOXIN) 1 by mouth once daily 4)  LEXAPRO 20 MG  TABS (ESCITALOPRAM OXALATE) 1 by mouth once daily 5)  FLEXERIL 10 MG  TABS (CYCLOBENZAPRINE HCL) 1 by mouth at bedtime 6)  TRAZODONE HCL 100 MG  TABS (TRAZODONE HCL) 2 by mouth at bedtime 7)  BAYER CHILDRENS ASPIRIN 81 MG  CHEW (ASPIRIN) once daily   Past Medical History: 1)  ulcer as teenager 2)  atrial dysrhythmia followed by cards---on digoxin 3)  fibromyalgia well controlled on flexeril, trazodone and lexapro   Thank you again for agreeing to see our patient; please contact us if you have any further questions or need additional information.  Sincerely, Vincent Gros MD

## 2010-10-25 NOTE — Assessment & Plan Note (Signed)
Summary: stomach pain, nausea, diarrhea x 5 days    Chief Complaint:  n/v/diarrhea; stomach pain.  History of Present Illness: 4 days ago had acute onset abdominal pain, diarrhea nausea and vomiting. Initially had some sweats. Sx contiue and now abd pain which is mostly epigastric is 8/10. Feels washed out. Multiple loose stools bwon and without blood or excessive mucous daily. Able to take Po liquids only.  Was well until this started. No tobacco,  only occ alcohol. Had stomach ulcer as teena ger but no problems in recent years w gerd sx. no new meds    Past Medical History:    ulcer as teenager  Past Surgical History:    Cholecystectomy    Hysterectomy      Physical Exam  Eyes:     No corneal or conjunctival inflammation noted. EOMI. Perrla. Funduscopic exam benign, without hemorrhages, exudates or papilledema. Vision grossly normal. Neck:     No deformities, masses, or tenderness noted. Lungs:     Normal respiratory effort, chest expands symmetrically. Lungs are clear to auscultation, no crackles or wheezes. Heart:     Normal rate and regular rhythm. S1 and S2 normal without gallop, murmur, click, rub or other extra sounds. Abdomen:     soft and bowel sounds hypoactive.  Mildly diffusely tender, no masses. Some mild to moderate guarding diffusely. No rebound BACK no CVA tenderness    Impression & Recommendations:  Problem # 1:  GASTRITIS, ACUTE (ICD-535.00) Assessment: New  Orders: [facility]- Est Level  3 ([address])    Patient Instructions: 1)  could have food poisoning component--will tx w cipro 250 two times a day 7 days. For sx relief  24 hrs of divided dose immodium, add sucralfate 1 gm qid for next 1-2 weeks as well as two times a day nexium 40 mg for 5 days. return if not improving significantly or if new or worsening sx 2)  Drink as much fluid as you can tolerate for the next few days.

## 2010-10-25 NOTE — Consult Note (Signed)
[facility]   Imported By: De Nurse 07/27/2009 11:39:50  _____________________________________________________________________  External Attachment:    Type:   Image     Comment:   External Document

## 2010-10-25 NOTE — Miscellaneous (Signed)
Summary: [facility]  [facility]   Imported By: Clydell Hakim 06/13/2009 15:43:18  _____________________________________________________________________  External Attachment:    Type:   Image     Comment:   External Document

## 2010-10-25 NOTE — Assessment & Plan Note (Signed)
Summary: syncope   Vital Signs:  Patient profile:   60 year old female Menstrual status:  hysterectomy Height:      68 inches Weight:      186 pounds BMI:     28.38 Temp:     98.1 degrees F Pulse rate:   80 / minute BP sitting:   103 / 64  Vitals Entered By: Gladstone Pih (September 20, 2009 9:43 AM)  Serial Vital Signs/Assessments:  Comments: 10:21 AM Ortho statics done laying 104/70 sitting 112/80 standing 108/80 By: Gladstone Pih   CC: syncope Is Patient Diabetic? No Pain Assessment Patient in pain? no        CC:  syncope.  History of Present Illness: Syncope Has has intermittent syncopal episodes for several years.  Underwent work up at [facility] cardiology with Dr Reyes Ivan approximately 3 years ago with what sounds to be echo, holters and stress test.  Was placed on digoxin.  Was symptom free for several years but had sudden syncopal episode last week without prodrome.  Today felt lightheadedness and slid down the wall with perhaps brief loss of consciosness.   Felt like her pulse was very slow after each episode.  Chronically has symptoms of lightheadedness when stands quickly and has always had low blood pressure.  Marland Kitchen  Has no chest pain or symptoms with exertion even with recent snow shoveling.    No incontinence or tongue biting with any episodes  No recent changes in medication.  Is on thyroid replacement after recent thyroid surgery for cancer.    Has chronic R leg swelling after hip replacement.   No orthopnea PND focal muscle weakness visual changes history of seizures   ROS - as above PMH - Medications reviewed and updated in medication list.  Smoking Status noted in VS form    Habits & Providers  Alcohol-Tobacco-Diet     Tobacco Status: never  Current Medications (verified): 1)  Nexium 40 Mg Cpdr (Esomeprazole Magnesium) .... Take 1 Capsule Once A Day 2)  Lanoxin 0.25 Mg  Tabs (Digoxin) .Marland Kitchen.. 1 By Mouth Once Daily 3)  Celexa 40 Mg  Tabs (Citalopram  Hydrobromide) .Marland Kitchen.. 1 By Mouth Qd 4)  Flexeril 10 Mg  Tabs (Cyclobenzaprine Hcl) .Marland Kitchen.. 1 By Mouth Two Times A Day or Three Times A Day Prn 5)  Trazodone Hcl 100 Mg  Tabs (Trazodone Hcl) .... 2 By Mouth At Bedtime 6)  Relpax 40 Mg  Tabs (Eletriptan Hydrobromide) .Marland Kitchen.. 1 At Beginniing of Headache and May Repeat Once in 2 Hours, Max 80 Mg in 24 Hours Prn 7)  Miralax  Powd (Polyethylene Glycol 3350) .Marland Kitchen.. 1 Scoop Once Daily Disp 1 Month or 3 Months 8)  Wellbutrin Xl 150 Mg Xr24h-Tab (Bupropion Hcl) .Marland Kitchen.. 1 By Mouth Qd 9)  Levothyroxine Sodium 150 Mcg Tabs (Levothyroxine Sodium) .... Per Dr Talmage Nap  Past History:  Past Medical History: ulcer as teenager atrial dysrhythmia followed by cards---on digoxin fibromyalgia well controlled on flexeril, trazodone and lexapro Hip Replacement Thyroid Cancer status post  resection on replacement Dr Talmage Nap  Social History: No  tobacco, illegal drugs. Drinks socially only. Exercises daily most of the time works as Engineer, civil (consulting) at [employer]Smoking Status:  never  Physical Exam  General:  Well-developed,well-nourished,in no acute distress; alert,appropriate and cooperative throughout examination Lungs:  Normal respiratory effort, chest expands symmetrically. Lungs are clear to auscultation, no crackles or wheezes. Heart:  Normal rate and regular rhythm. S1 and S2 normal without gallop, murmur, click, rub or other extra  sounds.   Orthostatic BP do not show significant changes Extremities:  1+ edema R lower leg  Neurologic:  alert & oriented X3.     Impression & Recommendations:  Problem # 1:  SYNCOPE (ICD-780.2) her symptoms seem most consistent with rhythm disturbance perhaps tachy-brady.  No symptoms/sx  of ischemia or neurological disease.  The episode of sudden syncope without prodrome is concerning.    Will check TSH given recent thyroid surgery and chemistries.  Recent dig level normal.  Will refer for cardiology evaluation.  She would prefer not to return to [facility]  cardiology since Dr Reyes Ivan has left and requests Dr Graciela Husbands if available.   Advised her not to engage in activities that would be dangerous if had another episode   ECG - NSR rate 67 without significant ST changes. Orders: EKG- [facility] (EKG) Comp Met-[facility] ([id number]) TSH-[facility] ([id number]) Cardiology Referral (Cardiology) [facility]- Est  Level 4 ([address])  Complete Medication List: 1)  Nexium 40 Mg Cpdr (Esomeprazole magnesium) .... Take 1 capsule once a day 2)  Lanoxin 0.25 Mg Tabs (Digoxin) .Marland Kitchen.. 1 by mouth once daily 3)  Celexa 40 Mg Tabs (Citalopram hydrobromide) .Marland Kitchen.. 1 by mouth qd 4)  Flexeril 10 Mg Tabs (Cyclobenzaprine hcl) .Marland Kitchen.. 1 by mouth two times a day or three times a day prn 5)  Trazodone Hcl 100 Mg Tabs (Trazodone hcl) .... 2 by mouth at bedtime 6)  Relpax 40 Mg Tabs (Eletriptan hydrobromide) .Marland Kitchen.. 1 at beginniing of headache and may repeat once in 2 hours, max 80 mg in 24 hours prn 7)  Miralax Powd (Polyethylene glycol 3350) .Marland Kitchen.. 1 scoop once daily disp 1 month or 3 months 8)  Wellbutrin Xl 150 Mg Xr24h-tab (Bupropion hcl) .Marland Kitchen.. 1 by mouth qd 9)  Levothyroxine Sodium 150 Mcg Tabs (Levothyroxine sodium) .... Per dr balan  Patient Instructions: 1)  Will make you an appointment with [facility] Cardiology 2)  If have sudden syncope again call 911 3)  Request your previous cardiologist records to be faxed to [place] 4)  Call if feeling worse [phone]

## 2010-10-25 NOTE — Miscellaneous (Signed)
Summary: flu vaccine given  Clinical Lists Changes       Influenza Vaccine    Vaccine Type: Fluvax 3+    Site: right deltoid    Mfr: GlaxoSmithKline    Dose: 0.5 ml    Route: IM    Given by: Theresia Lo RN    Exp. Date: 03/20/2011    Lot #: [id number]    VIS given: 04/17/10 version given June 25, 2010.  Flu Vaccine Consent Questions    Do you have a history of severe allergic reactions to this vaccine? no    Any prior history of allergic reactions to egg and/or gelatin? no    Do you have a sensitivity to the preservative Thimersol? no    Do you have a past history of Guillan-Barre Syndrome? no    Do you currently have an acute febrile illness? no    Have you ever had a severe reaction to latex? no    Vaccine information given and explained to patient? yes    Are you currently pregnant? no

## 2010-10-25 NOTE — Consult Note (Signed)
Summary: [facility]  [facility]   Imported By: Clydell Hakim 04/18/2009 16:39:31  _____________________________________________________________________  External Attachment:    Type:   Image     Comment:   External Document

## 2010-10-25 NOTE — Letter (Signed)
Summary: Clearance Letter  [facility], Main Office  [address]   [place], [place] [address]   Phone: [phone]  Fax: [phone]    January 18, 2010  Re:     [place] [address] Address:   [address]     [place], [place]  [address] DOB:     1951-01-12 MRN:     [id number]   Dear Dr Jennette Kettle,     Patient is at acceptable cardiac risk for surgery.  Please call with questions.         Sincerely,  Sherryl Manges, MD, [facility] Gypsy Balsam RN BSN

## 2010-10-25 NOTE — Consult Note (Signed)
Summary: [facility]  [facility]   Imported By: Knox Royalty 02/05/2008 09:00:21  _____________________________________________________________________  External Attachment:    Type:   Image     Comment:   External Document

## 2010-10-25 NOTE — Consult Note (Signed)
[facility]: [facility]   Imported By: De Nurse 06/15/2009 14:51:55  _____________________________________________________________________  External Attachment:    Type:   Image     Comment:   External Document

## 2010-10-25 NOTE — Miscellaneous (Signed)
Summary: [facility]  [facility]   Imported By: Clydell Hakim 06/13/2009 15:44:02  _____________________________________________________________________  External Attachment:    Type:   Image     Comment:   External Document

## 2010-10-25 NOTE — Letter (Signed)
Summary: Dexa Scan  Hardcopy in MD box.  Bone Density  Procedure date:  08/24/2007  Findings:       Lumbar Spine:  T Score > -1.0 Spine.  Location:  [facility].    Hip Total: T Score -2.5 to -1.0 Hip.      Comments:      very slight osteopenia at hip, normal lumbar   Bone Density  Procedure date:  08/24/2007  Findings:       Lumbar Spine:  T Score > -1.0 Spine.  Location:  [facility].    Hip Total: T Score -2.5 to -1.0 Hip.      Comments:      very slight osteopenia at hip, normal lumbar

## 2010-10-25 NOTE — Letter (Signed)
Summary: [facility] Office Note  [facility] Office Note   Imported By: Roderic Ovens 11/03/2009 15:05:40  _____________________________________________________________________  External Attachment:    Type:   Image     Comment:   External Document

## 2010-10-25 NOTE — Assessment & Plan Note (Signed)
Summary: hip & orthotics adjustment   Vitals Entered By: Lillia Pauls CMA (September 08, 2007 2:54 PM)                 Chief Complaint:  L HIP PAIN AND ADJ ORTHOTICS.  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 presents for f/u of L hip pain and OA changes with continued pain and also with pain on her L foot that has been ongoing for last 2 weeks. Foot pain on the forefoot and in the region of 2nd metatarsal head.  L hip is significantly altered her exercise pattern, now diminished from previously able to exercise 5 times a week walking vigorously.  denies specific trauma  Her for f/u on x-ray and Ct of hip.  PMH significant for fibromyalgia       Review of Systems       For overall ROS, please see HPI. patient denies fevers, chills, myalgias, chest pain, shortness of breath, and otherwise has no complaints other than those noted.    Physical Exam  General:     Well-developed,well-nourished,in no acute distress; alert,appropriate and cooperative throughout examination Head:     Normocephalic and atraumatic without obvious abnormalities. No apparent alopecia or balding. Lungs:     normal respiratory effort.   Msk:     Marked limitation on internal ROM at L hip, with approx. 15 degree difference on external rotation compared with right hip. pain with terminal internal and external ROM. Abduction at L hip wnl and addcution limited and causes pain.  Str 4/5 on L hip abduction and 4/5 on R hip flexion, otherwise all LE 5/5  Evidence of bilateral transverse arch collapse with 2nd MT heads dropped. L mildly TTP with squeeze test.  grossly non-antalgic gait.  Pulses:     R and L dorsalis pedis and posterior tibial pulses are full and equal bilaterally Neurologic:     Sensory and coordinative functions appear intact.   Hip Exam  General:    alert.       Impression & Recommendations:  Problem # 1:  HIP PAIN, LEFT (ICD-719.45) Moderate degenerative  changes noted with significant spurring seen on CT of hip on bone films.   Discussed exercise with patient and encouraged activitiy, no stress, light walking approx 3 times a week or bike, elliptical, swimming.  work on L hip abduction weakness and R hip flexion weakness with exercises shown in clinic.  consider surgical intervention when for degenerative joint disease of hip.  Also, patient taking Tramadol as needed.  Her updated medication list for this problem includes:    Flexeril 10 Mg Tabs (Cyclobenzaprine hcl) .Marland Kitchen... 1 by mouth at bedtime    Bayer Childrens Aspirin 81 Mg Chew (Aspirin) ..... Once daily  Orders: [facility]- Est Level  3 ([address])   Problem # 2:  METATARSALGIA (ICD-726.70) Placed a metataral pad on patient's orthotic, now patient with bilateral MT pads on her orthotics. Felt well to patient after placement. Orders: [facility]- Est Level  3 ([address]) Metatarsal pads- [facility] (U9811)   Complete Medication List: 1)  Allegra 180 Mg Tabs (Fexofenadine hcl) .... Take 1 tablet by mouth once a day 2)  Nexium 40 Mg Cpdr (Esomeprazole magnesium) .... Take 1 capsule once a day 3)  Lanoxin 0.25 Mg Tabs (Digoxin) .Marland Kitchen.. 1 by mouth once daily 4)  Lexapro 20 Mg Tabs (Escitalopram oxalate) .Marland Kitchen.. 1 by mouth once daily 5)  Flexeril 10 Mg Tabs (Cyclobenzaprine hcl) .Marland Kitchen.. 1 by mouth  at bedtime 6)  Trazodone Hcl 100 Mg Tabs (Trazodone hcl) .... 2 by mouth at bedtime 7)  Bayer Childrens Aspirin 81 Mg Chew (Aspirin) .... Once daily     ]

## 2010-10-25 NOTE — Letter (Signed)
Summary: [facility] and Rebecca Marshall  [facility] Rebecca Marshall,Rebecca Marshall   Imported By: Marily Memos 10/11/2010 11:58:55  _____________________________________________________________________  External Attachment:    Type:   Image     Comment:   External Document

## 2010-10-25 NOTE — Miscellaneous (Signed)
  Clinical Lists Changes Endocrinologist told Rebecca Marshall her labs looked great. Has f/u w her q 6 m. [name] having some fatigue and feels cold a lot. Will check B 12 (endocrines suggestion) and cbc. Problems: Added new problem of FATIGUE (ICD-780.79) Orders: Added new Test order of CBC w/Diff-[facility] [phone]) - Signed Added new Test order of B12-[facility] ([id number]) - Signed      Complete Medication List: 1)  Nexium 40 Mg Cpdr (Esomeprazole magnesium) .Marland Kitchen.. 1 tab two times a day 2)  Celexa 40 Mg Tabs (Citalopram hydrobromide) .Marland Kitchen.. 1 by mouth qd 3)  Flexeril 10 Mg Tabs (Cyclobenzaprine hcl) .... Take one tablet at bedtime 4)  Trazodone Hcl 100 Mg Tabs (Trazodone hcl) .... 2 by mouth at bedtime 5)  Relpax 40 Mg Tabs (Eletriptan hydrobromide) .Marland Kitchen.. 1 at beginniing of headache and may repeat once in 2 hours, max 80 mg in 24 hours prn 6)  Miralax Powd (Polyethylene glycol 3350) .Marland Kitchen.. 1 scoop once daily disp 1 month or 3 months 7)  Wellbutrin Xl 150 Mg Xr24h-tab (Bupropion hcl) .Marland Kitchen.. 1 by mouth qd 8)  Levothroid 137 Mcg Tabs (Levothyroxine sodium) .... Take one tablet once daily 9)  Valium 10 Mg Tabs (Diazepam) .... Soig 1/2 -1 tab by mouth once daily or two times a day as needed anxiety or back spasm 10)  Estrace 0.1 Mg/gm Crea (Estradiol) .Marland Kitchen.. 1 applicator full per vagina 3-4 x per week disp qs 3 m 11)  Tramadol Hcl 100 Mg Xr24h-tab (Tramadol hcl) .Marland Kitchen.. 1 tab two times a day 12)  Aleve 220 Mg Tabs (Naproxen sodium) .... As needed 13)  Flecainide Acetate 100 Mg Tabs (Flecainide acetate) .Marland Kitchen.. 1 tablet 2 times per day

## 2010-10-25 NOTE — Miscellaneous (Signed)
Summary: Solumedrol injection  Clinical Lists Changes  Verbal order received from Dr. Jennette Kettle for Solumedrol 40  mg IM stat for allergic reaction in eyes. Theresia Lo RN  February 23, 2010 5:32 PM     Medication Administration  Injection # 1:    Medication: Solumedrol up to 40mg     Diagnosis: ALLERGIC REACTION (ICD-995.3)    Route: IM    Site: L deltoid    Exp Date: 04/2011    Lot #: [id number]    Mfr: Pharmacia    Comments: Solumedrol 40 mg given IM    Patient tolerated injection without complications    Given by: Theresia Lo RN (February 23, 2010 5:31 PM)  Orders Added: 1)  Solumedrol up to 40mg  [[id number]]   Complete Medication List: 1)  Nexium 40 Mg Cpdr (Esomeprazole magnesium) .Marland Kitchen.. 1 tab two times a day 2)  Celexa 40 Mg Tabs (Citalopram hydrobromide) .Marland Kitchen.. 1 by mouth qd 3)  Flexeril 10 Mg Tabs (Cyclobenzaprine hcl) .... Take one tablet at bedtime 4)  Trazodone Hcl 100 Mg Tabs (Trazodone hcl) .... 2 by mouth at bedtime 5)  Relpax 40 Mg Tabs (Eletriptan hydrobromide) .Marland Kitchen.. 1 at beginniing of headache and may repeat once in 2 hours, max 80 mg in 24 hours prn 6)  Miralax Powd (Polyethylene glycol 3350) .Marland Kitchen.. 1 scoop once daily disp 1 month or 3 months 7)  Wellbutrin Xl 150 Mg Xr24h-tab (Bupropion hcl) .Marland Kitchen.. 1 by mouth qd 8)  Levothroid 137 Mcg Tabs (Levothyroxine sodium) .... Take one tablet once daily 9)  Valium 10 Mg Tabs (Diazepam) .... Soig 1/2 -1 tab by mouth once daily or two times a day as needed anxiety or back spasm 10)  Estrace 0.1 Mg/gm Crea (Estradiol) .Marland Kitchen.. 1 applicator full per vagina 3-4 x per week disp qs 3 m 11)  Tramadol Hcl 50 Mg Tabs (Tramadol hcl) .Marland Kitchen.. 1-2 tabs  q 4-6 hrs as needed hip pain with max of 8 tabs a day. 12)  Flecainide Acetate 100 Mg Tabs (Flecainide acetate) .Marland Kitchen.. 1 tablet 2 times per day

## 2010-10-25 NOTE — Progress Notes (Signed)
Summary: c/o fluttering --ask Dr.  Graciela Husbands 12-11-09  Phone Note Call from Patient Call back at Home Phone [phone]   Caller: Patient Reason for Call: Talk to Nurse Details for Reason: Per pt calling, still having fluttering , should she increase her dosage of FLECAINIDE ACETATE 50 MG TABS two times a day. Initial call taken by: Lorne Skeens,  December 07, 2009 2:51 PM  Follow-up for Phone Call        [facility] [id number] Katina Dung, RN, BSN  December 07, 2009 3:06 PM  talked with patient--started on Flecanide 50mg  twice a day 11-14-09--for the first few days she was on Flecanide she continued to have palpitations and then they improved--recently the palpitations have been more frequent(about every 2-3 days) although better than before she was started on Flecanide--she is asking if she should increase the Flecanide to three times a day-- next appt with Dr Graciela Husbands 01-11-10 -I will forward to Dr Graciela Husbands  for review--pt aware Dr Graciela Husbands out of office until 12-11-09     Appended Document: c/o fluttering --ask Dr.  Graciela Husbands 12-11-09 Discussed above with Dr.Jonavon Trieu...ok to increase Flecanide to 100mg  2 times per day.Marland KitchenMarland KitchenPatient is aware of this. She will call back if this does not work for her.

## 2010-10-25 NOTE — Miscellaneous (Signed)
  Clinical Lists Changes muscle spasm  neck hip pain will try toradol inj Orders: Added new Service order of EMR Misc Charge Code [facility]) - Signed      Complete Medication List: 1)  Nexium 40 Mg Cpdr (Esomeprazole magnesium) .... Take 1 capsule once a day 2)  Lanoxin 0.25 Mg Tabs (Digoxin) .Marland Kitchen.. 1 by mouth once daily 3)  Celexa 40 Mg Tabs (Citalopram hydrobromide) .Marland Kitchen.. 1 by mouth qd 4)  Flexeril 10 Mg Tabs (Cyclobenzaprine hcl) .Marland Kitchen.. 1 by mouth two times a day or three times a day prn 5)  Trazodone Hcl 100 Mg Tabs (Trazodone hcl) .... 2 by mouth at bedtime 6)  Relpax 40 Mg Tabs (Eletriptan hydrobromide) .Marland Kitchen.. 1 at beginniing of headache and may repeat once in 2 hours, max 80 mg in 24 hours prn 7)  Miralax Powd (Polyethylene glycol 3350) .Marland Kitchen.. 1 scoop once daily disp 1 month or 3 months 8)  Wellbutrin Xl 150 Mg Xr24h-tab (Bupropion hcl) .Marland Kitchen.. 1 by mouth qd 9)  Levothyroxine Sodium 150 Mcg Tabs (Levothyroxine sodium) .... Per dr Talmage Nap  Appended Document: toradol injection given    Clinical Lists Changes  Orders: Added new Service order of Ketorolac-Toradol 15mg  [phone]) - Signed       Medication Administration  Injection # 1:    Medication: Ketorolac-Toradol 15mg     Diagnosis: FIBROMYALGIA (ICD-729.1)    Route: IM    Site: RUOQ gluteus    Exp Date: 04/24/2011    Lot #: [id number]    Mfr: Novaplus    Comments: Toradol 60 mg given IM    Patient tolerated injection without complications    Given by: Theresia Lo RN (October 04, 2009 10:36 AM)  Orders Added: 1)  Ketorolac-Toradol 15mg  [[id number]]

## 2010-10-25 NOTE — Miscellaneous (Signed)
  Clinical Lists Changes  Medications: Removed medication of FLECAINIDE ACETATE 50 MG TABS (FLECAINIDE ACETATE) two times a day Added new medication of FLECAINIDE ACETATE 100 MG TABS (FLECAINIDE ACETATE) 1 tablet 2 times per day - Signed Rx of FLECAINIDE ACETATE 100 MG TABS (FLECAINIDE ACETATE) 1 tablet 2 times per day;  #60 x 6;  Signed;  Entered by: Layne Benton, RN, BSN;  Authorized by: Nathen May, MD, [facility];  Method used: Electronically to [facility]*, [address], [address] Shipping/mailing, [place], [place]  [address], Ph: [phone], Fax: [phone]    Prescriptions: FLECAINIDE ACETATE 100 MG TABS (FLECAINIDE ACETATE) 1 tablet 2 times per day  #60 x 6   Entered by:   Layne Benton, RN, BSN   Authorized by:   Nathen May, MD, [facility]   Signed by:   Layne Benton, RN, BSN on 12/11/2009   Method used:   Electronically to        [facility]* (retail)       [address]       [address] Shipping/mailing       [place], [place]  [address]       Ph: [phone]       Fax: [phone]   RxID:   [id number]

## 2010-10-25 NOTE — Assessment & Plan Note (Signed)
Summary: nutrition class,df            Complete Medication List: 1)  Allegra 180 Mg Tabs (Fexofenadine hcl) .... Take 1 tablet by mouth once a day 2)  Nexium 40 Mg Cpdr (Esomeprazole magnesium) .... Take 1 capsule once a day 3)  Lanoxin 0.25 Mg Tabs (Digoxin) .Marland Kitchen.. 1 by mouth once daily 4)  Celexa 40 Mg Tabs (Citalopram hydrobromide) .Marland Kitchen.. 1 by mouth qd 5)  Flexeril 10 Mg Tabs (Cyclobenzaprine hcl) .Marland Kitchen.. 1 by mouth two times a day or three times a day 6)  Trazodone Hcl 100 Mg Tabs (Trazodone hcl) .... 2 by mouth at bedtime 7)  Bayer Childrens Aspirin 81 Mg Chew (Aspirin) .... Once daily 8)  Relpax 40 Mg Tabs (Eletriptan hydrobromide) .Marland Kitchen.. 1 at beginniing of headache and may repeat once in 2 hours, max 80 mg in 24 hours 9)  Ultram 50 Mg Tabs (Tramadol hcl) .Marland Kitchen.. 1-2 by mouth three times a day as needed pain 10)  Fosamax 70 Mg Tabs (Alendronate sodium) .Marland Kitchen.. 1 by mouth qweek 11)  Miralax Powd (Polyethylene glycol 3350) .Marland Kitchen.. 1 scoop once daily disp 1 month or 3 months

## 2010-10-25 NOTE — Miscellaneous (Signed)
  Clinical Lists Changes recent surgery for thyroid mass now has to be repeated--frozen sections of mass did show cancer cells. Surgery sched this week. has started having some crying jags, feeling a little more depressed. No SI/HI. we discussed. She still has some valium left from a previous rx--will use as needed and for a few nights at bedtime for sleep--will also restart low dose wellbutrin and she will f/u w me latter part of this week, Medications: Added new medication of WELLBUTRIN XL 150 MG XR24H-TAB (BUPROPION HCL) 1 by mouth qd - Signed Rx of WELLBUTRIN XL 150 MG XR24H-TAB (BUPROPION HCL) 1 by mouth qd;  #90 x 1;  Signed;  Entered by: Denny Levy MD;  Authorized by: Denny Levy MD;  Method used: Electronically to [facility]*, [address], [address] Shipping/mailing, [place], [place]  [address], Ph: [phone], Fax: [phone]    Prescriptions: WELLBUTRIN XL 150 MG XR24H-TAB (BUPROPION HCL) 1 by mouth qd  #90 x 1   Entered and Authorized by:   Denny Levy MD   Signed by:   Denny Levy MD on 05/08/2009   Method used:   Electronically to        [facility]* (retail)       [address]       [address] Shipping/mailing       [place], [place]  [address]       Ph: [phone]       Fax: [phone]   RxID:   [id number]

## 2010-10-25 NOTE — Assessment & Plan Note (Signed)
Summary: per check out/sf   Primary Provider:  Jennette Kettle  CC:  follow up.  Pt needs letter of medical clearance for hip replacement.  .  History of Present Illness: [name] is seen in followup for palpitations in the setting of chronic hypotension recurrent presyncope associated with her palpitations.  we identified atrial tachycardia as a potential trigger and undertook therapy with flecainide initially at 50 and 100 mg twice daily. Since up titration she has been largely asymptomatic with out either palpitations or dizziness.  She is anticipating hip replacement surgery as she has one of the recalled hits. She is not able to walk on the treadmill at this time.  Current Medications (verified): 1)  Nexium 40 Mg Cpdr (Esomeprazole Magnesium) .Marland Kitchen.. 1 Tab Two Times A Day 2)  Celexa 40 Mg  Tabs (Citalopram Hydrobromide) .Marland Kitchen.. 1 By Mouth Qd 3)  Flexeril 10 Mg  Tabs (Cyclobenzaprine Hcl) .... Take One Tablet At Bedtime 4)  Trazodone Hcl 100 Mg  Tabs (Trazodone Hcl) .... 2 By Mouth At Bedtime 5)  Relpax 40 Mg  Tabs (Eletriptan Hydrobromide) .Marland Kitchen.. 1 At Beginniing of Headache and May Repeat Once in 2 Hours, Max 80 Mg in 24 Hours Prn 6)  Miralax  Powd (Polyethylene Glycol 3350) .Marland Kitchen.. 1 Scoop Once Daily Disp 1 Month or 3 Months 7)  Wellbutrin Xl 150 Mg Xr24h-Tab (Bupropion Hcl) .Marland Kitchen.. 1 By Mouth Qd 8)  Levothroid 137 Mcg Tabs (Levothyroxine Sodium) .... Take One Tablet Once Daily 9)  Valium 10 Mg Tabs (Diazepam) .... Soig 1/2 -1 Tab By Mouth Once Daily or Two Times A Day As Needed Anxiety or Back Spasm 10)  Estrace 0.1 Mg/gm Crea (Estradiol) .Marland Kitchen.. 1 Applicator Full Per Vagina 3-4 X Per Week Disp Qs 3 M 11)  Tramadol Hcl 100 Mg Xr24h-Tab (Tramadol Hcl) .Marland Kitchen.. 1 Tab Two Times A Day 12)  Flecainide Acetate 100 Mg Tabs (Flecainide Acetate) .Marland Kitchen.. 1 Tablet 2 Times Per Day  Allergies (verified): No Known Drug Allergies  Past History:  Past Medical History: Last updated: 10/04/2009 ulcer as teenager atrial  dysrhythmia followed by cards---on digoxin fibromyalgia well controlled on flexeril, trazodone and lexapro Hip Replacement Thyroid Cancer status post  resection on replacement Dr Talmage Nap Anxiety/depression GE reflux disease Arthritis  Past Surgical History: Last updated: 10/04/2009 Cholecystectomy Hysterectomy Thyroidectomy--papillary carcimao Dr Lazarus Salines 04/2009 Left hip replacement with question recall hip prosthesis Breast biopsy Tubal ligation Bone spur right great toe  Family History: Last updated: 10/04/2009 Family History of Cancer:   Social History: Last updated: 09/20/2009 No  tobacco, illegal drugs. Drinks socially only. Exercises daily most of the time works as Engineer, civil (consulting) at [employer]  Vital Signs:  Patient profile:   60 year old female Menstrual status:  hysterectomy Height:      68 inches Weight:      195 pounds BMI:     29.76 Pulse rate:   81 / minute Pulse rhythm:   regular BP sitting:   108 / 80  (left arm) Cuff size:   regular  Vitals Entered By: Judithe Modest CMA (January 11, 2010 1:32 PM)  Physical Exam  General:  The patient was alert and oriented in no acute distress. HEENT Normal.  Neck veins were flat, carotids were brisk.  Lungs were clear.  Heart sounds were regular without murmurs or gallops.  Abdomen was soft with active bowel sounds. There is no clubbing cyanosis or edema. Skin Warm and dry    EKG  Procedure date:  01/11/2010  Findings:      nsr 66 .20/.10/.42 rsr o/w normal   Impression & Recommendations:  Problem # 1:  ATRIAL TACHYCARDIA-NONSUSTAINED (ICD-427.0) For atrial arrhythmias and palpitations are largely quiet and suppressed by her flecainide. She is tolerating the medication without side effects. Her last stress test with some years ago. Assessment of coronary perfusion is essential to minimize the risk of pro arrhythmia. As she is unable to ambulate given her hip issues we'll need to undertake Myoview scanning.  Her  updated medication list for this problem includes:    Flecainide Acetate 100 Mg Tabs (Flecainide acetate) .Marland Kitchen... 1 tablet 2 times per day  Problem # 2:  HYPOTENSION, CHRONIC (ICD-458.1) her hypotension and her presyncope is largely abated. It seems that in large part it was being triggered by her atrial arrhythmia. That being suppressed by the flecainide has a limited to the secondary effects.  Problem # 3:  SYNCOPE (ICD-780.2) no recurrent syncope Her updated medication list for this problem includes:    Flecainide Acetate 100 Mg Tabs (Flecainide acetate) .Marland Kitchen... 1 tablet 2 times per day  Problem # 4:  ASEPTIC NECROSIS OF HEAD AND NECK OF FEMUR (ICD-733.42) she has anticipated need for hip replacement surgery. We will plan to undertake Myoview scanning for the flexion assessment this will also allow [place] to easily cleared her for her surgery.periprocedural data blockers might be of value.  Other Orders: Nuclear Stress Test (Nuc Stress Test)  Patient Instructions: 1)  Your physician has requested that you have an adenosine myoview.  For further information please visit [email or web address]  Please follow instruction sheet, as given.

## 2010-10-25 NOTE — Miscellaneous (Signed)
  Clinical Lists Changes  Medications: Added new medication of RELPAX 40 MG  TABS (ELETRIPTAN HYDROBROMIDE) 1 at beginniing of headache and may repeat once in 2 hours, max 80 mg in 24 hours - Signed Rx of RELPAX 40 MG  TABS (ELETRIPTAN HYDROBROMIDE) 1 at beginniing of headache and may repeat once in 2 hours, max 80 mg in 24 hours;  #18 x 3;  Signed;  Entered by: Denny Levy MD;  Authorized by: Denny Levy MD;  Method used: Print then Give to Patient    Prescriptions: RELPAX 40 MG  TABS (ELETRIPTAN HYDROBROMIDE) 1 at beginniing of headache and may repeat once in 2 hours, max 80 mg in 24 hours  #18 x 3   Entered and Authorized by:   Denny Levy MD   Signed by:   Denny Levy MD on 02/17/2008   Method used:   Print then Give to Patient   RxID:   [id number]

## 2010-10-25 NOTE — Assessment & Plan Note (Signed)
Summary: nep/syncope/jml notes in emr   Primary Provider:  Jennette Kettle  CC:  nep/sycope and palpitations.  Pt reports these happen every couple of weeks for the last 2 years..  History of Present Illness: Mrs. Elijah Birk is seen at the request of Dr. Jennette Kettle for recurrent syncope associated with and without palpitations. 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  She awakens on the floor with modest residual orthostatic intolerance, but unaccompanied by nausea. palps, diaphoresis or sweats.  These have occurred at work and her HR has been documented as low as 19 and as high as 200.  these have occurred both from the standing as well as the sitting position.  She has hurt herself on a number of occasions. Most of these spells occur in the a.m.Marland Kitchen  She showers in the morning, but denies significant symptoms in the shower.  She eats breakfast daily, though just peanut butter sandwich and yoghurt.    She was seen by [place] cards and workup included an echo-normal, holter-normal which were reviewed and a stress test reported as normal.   The symptoms have worsened of late.  She has a history of chronic depression which recently has been worsened by chronic pain related to hip surgery which is now apparently in need of re do because of "recall."  She also has a history of chronic low BP and has syncope when her BP falls below 85.  These spells are distinctly different,  characterized by nausea, diaphoresis pallor.     Current Medications (verified): 1)  Nexium 40 Mg Cpdr (Esomeprazole Magnesium) .... Take 1 Capsule Once A Day 2)  Lanoxin 0.25 Mg  Tabs (Digoxin) .Marland Kitchen.. 1 By Mouth Once Daily 3)  Celexa 40 Mg  Tabs (Citalopram Hydrobromide) .Marland Kitchen.. 1 By Mouth Qd 4)  Flexeril 10 Mg  Tabs (Cyclobenzaprine Hcl) .... Take One Tablet At Bedtime 5)  Trazodone Hcl 100 Mg  Tabs (Trazodone Hcl) .... 2 By Mouth  At Bedtime 6)  Relpax 40 Mg  Tabs (Eletriptan Hydrobromide) .Marland Kitchen.. 1 At Beginniing of Headache and May Repeat Once in 2 Hours, Max 80 Mg in 24 Hours Prn 7)  Miralax  Powd (Polyethylene Glycol 3350) .Marland Kitchen.. 1 Scoop Once Daily Disp 1 Month or 3 Months 8)  Wellbutrin Xl 150 Mg Xr24h-Tab (Bupropion Hcl) .Marland Kitchen.. 1 By Mouth Qd 9)  Levothroid 137 Mcg Tabs (Levothyroxine Sodium) .... Take One Tablet Once Daily 10)  Ibuprofen 600 Mg Tabs (Ibuprofen) .... Every 8 Hours As Needed 11)  Valium 5 Mg Tabs (Diazepam) .... Take One Tablet Once Daily  Allergies (verified): No Known Drug Allergies  Past History:  Family History: Last updated: 10/04/2009 Family History of Cancer:   Social History: Last updated: 09/20/2009 No  tobacco, illegal drugs. Drinks socially only. Exercises daily most of the time works as Engineer, civil (consulting) at [employer]  Past Medical History: ulcer as teenager atrial dysrhythmia followed by cards---on digoxin fibromyalgia well controlled on flexeril, trazodone and lexapro Hip Replacement Thyroid Cancer status post  resection on replacement Dr Talmage Nap Anxiety/depression GE reflux disease Arthritis  Past Surgical History: Cholecystectomy Hysterectomy Thyroidectomy--papillary carcimao Dr Lazarus Salines 04/2009 Left hip replacement with question recall hip prosthesis Breast biopsy Tubal ligation Bone spur right great toe  Family History: Family History of Cancer:   Review of Systems       full review of systems was negative apart  from a history of present illness and past medical history. \par Vital Signs:  Patient profile:   61 year old female Menstrual status:  hysterectomy Height:      68 inches Weight:      190 pounds BMI:     28.99 Pulse rate:   78 / minute Pulse (ortho):   83 / minute Pulse rhythm:   regular BP sitting:   118 / 74  (right arm) BP standing:   119 / 79 Cuff size:   regular  Vitals Entered By: Judithe Modest CMA (October 04, 2009 11:35 AM)  Serial Vital  Signs/Assessments:  Time      Position  BP       Pulse  Resp  Temp     By 12:07 PM  Lying RA  118/77   79                    Amanda Trulove CMA 12:07 PM  Sitting   125/85   82                    Amanda Trulove CMA 12:07 PM  Standing  119/79   83                    Amanda Trulove CMA  Comments: 12:07 PM 2 minutes-121/86 HR 85 3 minutes-116/81 HR 83 By: Judithe Modest CMA    Physical Exam  General:  Alert and oriented well-developed and well-nourished Caucasian female appearing her stated age in no acute distress. HEENT  normal . Neck veins were flat; carotids brisk and full without bruits. No lymphadenopathy.There is a thyroidectomy scar Back without kyphosis. Lungs clear. Heart sounds regular without murmurs or gallops. PMI nondisplaced. Abdomen soft with active bowel sounds without midline pulsation or hepatomegaly. Femoral pulses and distal pulses intact. Extremities were without clubbing cyanosis or edemaSkin warm and dry. Neurological exam grossly normal. affect was engagiung    Impression & Recommendations:  Problem # 1:  SYNCOPE (ICD-780.2) The patient has recurrent syncope of 2 types  the first occurs in the context of her chronic hypotension. She consents her blood pressure falling; when it gets below about 85 she is prone to passing out associated with a prodrome consistent with neurally mediated hypotension.  She also has a history of abrupt syncope. There is very little warning. These are occasionally accompanied by palpitations. She is herself a number of occasions. In the context of her left cardiogram be essentially normal( relative to conduction abnormalities) and her ultrasound being normal it is most likely that this represents a neurally mediated episode also. The fact that she has other symptoms consistent with this diagnosis is further reinforcing. The abruptness of the onset is however not dissuading. The question is, given her palpitations, is there any triggering  arrhythmia which might be amenable to primary therapy. I suspect that the answer to this is going to be no need to be clarified. Given the frequency of her episodes as well as the frequency of her palpitations an event recorder will be useful in this regard. We'll plan to see her back in 4-5 weeks' time to review the above data.  We also discussed the role of tilt table testing. I think would add relatively little to the clinical impression. It is important however given her chronic hypotension to make sure that she has had her cortisol assessed.  The other contributing factor is her anxiety/depression which is clearly worse of late.  There is clear interaction between depression and aggravation of neurally mediated symptoms. I think it is important that she take a very aggressive approach to remain deviation of this problem. I also note that she is on 5 or 6 psychotropic drugs. I wonder if there may not be some interaction. There has been some reports in the literature remotely of worsening of syncope with SSRIs. Orders: Event (Event)  Problem # 2:  PALPITATIONS, RECURRENT (ICD-785.1) please see the discussion above  Problem # 3:  HYPOTENSION, CHRONIC (ICD-458.1) please see the discussion above  Problem # 4:  ABNORMAL ELECTROCARDIOGRAM (ICD-794.31) I am inclined to consider Myoview scanning at her next visit given the change in her left cardiogram; we will repeat it at her visit.  Patient Instructions: 1)  Your physician has recommended that you wear an event monitor.  Event monitors are medical devices that record the heart's electrical activity. Doctors most often use these monitors to diagnose arrhythmias. Arrhythmias are problems with the speed or rhythm of the heartbeat. The monitor is a small, portable device. You can wear one while you do your normal daily activities. This is usually used to diagnose what is causing palpitations/syncope (passing out). 2)  Your physician has recommended you  make the following change in your medication: STOP DIGOXIN 3)  Your physician recommends that you schedule a follow-up appointment in: 5 WEEKS

## 2010-10-25 NOTE — Miscellaneous (Signed)
  Clinical Lists Changes  Orders: Added new Test order of Miscellaneous Lab Charge-[facility] [id number]) - Signed      Complete Medication List: 1)  Nexium 40 Mg Cpdr (Esomeprazole magnesium) .... Take 1 capsule once a day 2)  Celexa 40 Mg Tabs (Citalopram hydrobromide) .Marland Kitchen.. 1 by mouth qd 3)  Flexeril 10 Mg Tabs (Cyclobenzaprine hcl) .... Take one tablet at bedtime 4)  Trazodone Hcl 100 Mg Tabs (Trazodone hcl) .... 2 by mouth at bedtime 5)  Relpax 40 Mg Tabs (Eletriptan hydrobromide) .Marland Kitchen.. 1 at beginniing of headache and may repeat once in 2 hours, max 80 mg in 24 hours prn 6)  Miralax Powd (Polyethylene glycol 3350) .Marland Kitchen.. 1 scoop once daily disp 1 month or 3 months 7)  Wellbutrin Xl 150 Mg Xr24h-tab (Bupropion hcl) .Marland Kitchen.. 1 by mouth qd 8)  Levothroid 137 Mcg Tabs (Levothyroxine sodium) .... Take one tablet once daily 9)  Ibuprofen 600 Mg Tabs (Ibuprofen) .... Every 8 hours as needed 10)  Valium 5 Mg Tabs (Diazepam) .... Take one tablet once daily

## 2010-10-25 NOTE — Assessment & Plan Note (Signed)
Primary Care Provider:  Jennette Kettle   History of Present Illness: 1) f/u antidepressant change--we decreased celexa by half and she noticed more anxiety in about 3-4 days so she went back on original dose. back to baseline now.  2) cough--several weeks of cough, occasional fever. Mildly productive cough --tan sputum. Has been taking tessalon perles with little relief. Feels fatigued.  Had been feeling really good after her recent hip surgery  Current Medications (verified): 1)  Nexium 40 Mg Cpdr (Esomeprazole Magnesium) .Marland Kitchen.. 1 Tab Two Times A Day 2)  Celexa 40 Mg  Tabs (Citalopram Hydrobromide) .Marland Kitchen.. 1 By Mouth Qd 3)  Flexeril 10 Mg  Tabs (Cyclobenzaprine Hcl) .... Take One Tablet At Bedtime 4)  Trazodone Hcl 100 Mg  Tabs (Trazodone Hcl) .... 2 By Mouth At Bedtime 5)  Relpax 40 Mg  Tabs (Eletriptan Hydrobromide) .Marland Kitchen.. 1 At Beginniing of Headache and May Repeat Once in 2 Hours, Max 80 Mg in 24 Hours Prn 6)  Miralax  Powd (Polyethylene Glycol 3350) .Marland Kitchen.. 1 Scoop Once Daily Disp 1 Month or 3 Months 7)  Wellbutrin Xl 150 Mg Xr24h-Tab (Bupropion Hcl) .Marland Kitchen.. 1 By Mouth Qd 8)  Levothroid 137 Mcg Tabs (Levothyroxine Sodium) .... Take One Tablet Once Daily 9)  Valium 10 Mg Tabs (Diazepam) .... Sig 1/2 -1 Tab By Mouth Once Daily or Two Times A Day As Needed Anxiety or Back Spasm 10)  Tramadol Hcl 50 Mg Tabs (Tramadol Hcl) .Marland Kitchen.. 1-2 Tabs  Q 4-6 Hrs As Needed Hip Pain With Max of 8 Tabs A Day. 11)  Flecainide Acetate 100 Mg Tabs (Flecainide Acetate) .Marland Kitchen.. 1 Tablet 2 Times Per Day 12)  Avelox 400 Mg Tabs (Moxifloxacin Hcl) .Marland Kitchen.. 1 By Mouth Once Daily For 7 Days  Allergies: 1)  * Lexiscan (Technetium )  Physical Exam  General:  alert, well-developed, well-nourished, and well-hydrated.   Chest Wall:  nontender Lungs:  CTA B no wheeze Heart:  RRR   Impression & Recommendations:  Problem # 1:  COUGH (ICD-786.2)  Orders: CXR- 2view (CXR) recent surgery ---will do cxr. she is very worried so will  emoirically tx abx  Problem # 2:  ANXIETY DEPRESSION (ICD-300.4) agree w return to previous dose antidepressant  Complete Medication List: 1)  Nexium 40 Mg Cpdr (Esomeprazole magnesium) .Marland Kitchen.. 1 tab two times a day 2)  Celexa 40 Mg Tabs (Citalopram hydrobromide) .Marland Kitchen.. 1 by mouth qd 3)  Flexeril 10 Mg Tabs (Cyclobenzaprine hcl) .... Take one tablet at bedtime 4)  Trazodone Hcl 100 Mg Tabs (Trazodone hcl) .... 2 by mouth at bedtime 5)  Relpax 40 Mg Tabs (Eletriptan hydrobromide) .Marland Kitchen.. 1 at beginniing of headache and may repeat once in 2 hours, max 80 mg in 24 hours prn 6)  Miralax Powd (Polyethylene glycol 3350) .Marland Kitchen.. 1 scoop once daily disp 1 month or 3 months 7)  Wellbutrin Xl 150 Mg Xr24h-tab (Bupropion hcl) .Marland Kitchen.. 1 by mouth qd 8)  Levothroid 137 Mcg Tabs (Levothyroxine sodium) .... Take one tablet once daily 9)  Valium 10 Mg Tabs (Diazepam) .... Sig 1/2 -1 tab by mouth once daily or two times a day as needed anxiety or back spasm 10)  Tramadol Hcl 50 Mg Tabs (Tramadol hcl) .Marland Kitchen.. 1-2 tabs  q 4-6 hrs as needed hip pain with max of 8 tabs a day. 11)  Flecainide Acetate 100 Mg Tabs (Flecainide acetate) .Marland Kitchen.. 1 tablet 2 times per day 12)  Avelox 400 Mg Tabs (Moxifloxacin hcl) .Marland Kitchen.. 1 by mouth once  daily for 7 days CXR- 2view (CXR) Prescriptions: AVELOX 400 MG TABS (MOXIFLOXACIN HCL) 1 by mouth once daily for 7 days  #7 x 0   Entered and Authorized by:   Denny Levy MD   Signed by:   Denny Levy MD on 09/21/2010   Method used:   Electronically to        [facility]* (retail)       [address]       [address] Shipping/mailing       [place], [place]  [address]       Ph: [phone]       Fax: [phone]   RxID:   [id number]    Orders Added: 1)  CXR- 2view [CXR]     Impression & Recommendations:  Orders: CXR- 2view (CXR)   Complete Medication List: 1)  Nexium 40 Mg Cpdr (Esomeprazole magnesium) .Marland Kitchen.. 1 tab two times a day 2)  Celexa 40 Mg Tabs (Citalopram  hydrobromide) .Marland Kitchen.. 1 by mouth qd 3)  Flexeril 10 Mg Tabs (Cyclobenzaprine hcl) .... Take one tablet at bedtime 4)  Trazodone Hcl 100 Mg Tabs (Trazodone hcl) .... 2 by mouth at bedtime 5)  Relpax 40 Mg Tabs (Eletriptan hydrobromide) .Marland Kitchen.. 1 at beginniing of headache and may repeat once in 2 hours, max 80 mg in 24 hours prn 6)  Miralax Powd (Polyethylene glycol 3350) .Marland Kitchen.. 1 scoop once daily disp 1 month or 3 months 7)  Wellbutrin Xl 150 Mg Xr24h-tab (Bupropion hcl) .Marland Kitchen.. 1 by mouth qd 8)  Levothroid 137 Mcg Tabs (Levothyroxine sodium) .... Take one tablet once daily 9)  Valium 10 Mg Tabs (Diazepam) .... Sig 1/2 -1 tab by mouth once daily or two times a day as needed anxiety or back spasm 10)  Tramadol Hcl 50 Mg Tabs (Tramadol hcl) .Marland Kitchen.. 1-2 tabs  q 4-6 hrs as needed hip pain with max of 8 tabs a day. 11)  Flecainide Acetate 100 Mg Tabs (Flecainide acetate) .Marland Kitchen.. 1 tablet 2 times per day 12)  Avelox 400 Mg Tabs (Moxifloxacin hcl) .Marland Kitchen.. 1 by mouth once daily for 7 days     Prevention & Chronic Care Immunizations   Influenza vaccine: Not documented    Tetanus booster: Not documented    Pneumococcal vaccine: Not documented  Colorectal Screening   Hemoccult: Not documented    Colonoscopy: Not documented  Other Screening   Pap smear: Not documented    Mammogram: ASSESSMENT: Negative - BI-RADS 1^MM DIGITAL SCREENING  (08/28/2010)   Smoking status: never  (09/20/2009)  Lipids   Total Cholesterol: Not documented   LDL: Not documented   LDL Direct: 121  (07/04/2010)   HDL: Not documented   Triglycerides: Not documented

## 2010-10-25 NOTE — Letter (Signed)
Summary: Generic Letter  [facility]  [address]   [place], [place] [address]   Phone: [phone]  Fax: [phone]    06/19/2010  [facility] [address] [place], [place]  [address]  Dear Ms. CALDWELL,  Ms caldwell has a total left hip replacement. Please take this into consideration during airport screening procedures.         Sincerely,   Denny Levy MD

## 2010-10-25 NOTE — Progress Notes (Signed)
Summary: Nuc. Pre-Procedure  Phone Note Outgoing Call Call back at [facility] Phone [phone]   Call placed by: Irean Hong, RN,  January 15, 2010 2:01 PM Summary of Call: Reviewed information on Myoview Information Sheet (see scanned document for further details).  Spoke with patient.     Nuclear Med Background Indications for Stress Test: Evaluation for Ischemia, Surgical Clearance  Indications Comments: Pending THR (aseptic necrosis) by   History Comments: Hx.Atrial Tachycardia, and chronic hypotension.( symptoms improved on flecainide).  Symptoms: Palpitations    Nuclear Pre-Procedure Height (in): 68

## 2010-10-25 NOTE — Miscellaneous (Signed)
  Clinical Lists Changes  Medications: Changed medication from PREMARIN 0.625 MG/GM CREA (ESTROGENS, CONJUGATED) 1 applicator full per vagina 3-4 nigts a week as needed vaginal dryness disp 1 qs 1 month to ESTRACE 0.1 MG/GM CREA (ESTRADIOL) 1 applicator full per vagina 3-4 x per week disp qs 3 m - Signed Rx of ESTRACE 0.1 MG/GM CREA (ESTRADIOL) 1 applicator full per vagina 3-4 x per week disp qs 3 m;  #3 x 3;  Signed;  Entered by: Denny Levy MD;  Authorized by: Denny Levy MD;  Method used: Electronically to [facility]*, [address] Shipping/mailing, [place], [place]  [address], Ph: [phone], Fax: [phone] Rx of WELLBUTRIN XL 150 MG XR24H-TAB (BUPROPION HCL) 1 by mouth qd;  #90 Tablet x 3;  Signed;  Entered by: Denny Levy MD;  Authorized by: Denny Levy MD;  Method used: Electronically to [facility]*, [address] Shipping/mailing, [place], [place]  [address], Ph: [phone], Fax: [phone]    Prescriptions: WELLBUTRIN XL 150 MG XR24H-TAB (BUPROPION HCL) 1 by mouth qd  #90 Tablet x 3   Entered and Authorized by:   Denny Levy MD   Signed by:   Denny Levy MD on 11/01/2009   Method used:   Electronically to        [facility]* (retail)       [address]       [address] Shipping/mailing       [place], [place]  [address]       Ph: [phone]       Fax: [phone]   RxID:   [id number] ESTRACE 0.1 MG/GM CREA (ESTRADIOL) 1 applicator full per vagina 3-4 x per week disp qs 3 m  #3 x 3   Entered and Authorized by:   Denny Levy MD   Signed by:   Denny Levy MD on 11/01/2009   Method used:   Electronically to        [facility]* (retail)       [address]       [address] Shipping/mailing       [place], [place]  [address]       Ph: [phone]       Fax: [phone]   RxID:   [id number]

## 2010-10-25 NOTE — Assessment & Plan Note (Signed)
Summary: PER [facility]    History of Present Illness: c/o 2 1/2 weeks of fatigue with muscles feeling crampy and tired,particularly her lower extremity and buttock musc les. No fevers sweats or chills. Only new med in last few months is digoxin and has been on that for afluttr-er or afib followed by cards. They have not changed her dose or checked level recently. No other new meds or OTC or herbals or vitamins. Had some back pain a few months ago and was on neurontin for a while--that seemed to really help--she has tapered off that. Still has intermittent sciatic pain down her buttock but not into her leg. Has been able to get back to daily exercise program but last 2 weeks she has not felt like doing that, but continued. Muscles feel a little sore.  No recent stressors--things going really well at home and work. Sleeps Ok and appetite is normal--she is trying to eat a little better and  has lost 4-5 lbs last 2 m. No chest pains or SOB; pulse was in low 60s when she first started her digoxin, no she thinks it is running in 80-90 range.    Past Medical History:    ulcer as teenager    atrial dysrhythmia followed by cards---on digoxin    fibromyalgia well controlled on flexeril, trazodone and lexapro  Past Surgical History:    Reviewed history from 03/03/2007 and no changes required:       Cholecystectomy       Hysterectomy   Social History:    denies tobaccor, illegal drugs. Drinks  socially only.    Exercises daily most of the time    works as Engineer, civil (consulting) at [employer]   Risk Factors:  Exercise:  yes    Physical Exam  General:     alert.   Neck:     supple, full ROM, and no masses.   Lungs:     normal respiratory effort.   Heart:     normal rate, regular rhythm, and no murmur.   Abdomen:     soft.   Msk:     normal ROM, no joint tenderness, and no joint swelling.   Neurologic:     alert & oriented X3, strength normal in all extremities, and gait normal.      Impression &  Recommendations:  Problem # 1:  FATIGUE, ACUTE (ICD-780.79) Assessment: New  Orders: [facility]- Est Level  3 (81191) Comp Met-[facility] (47829-56213) Digoxin-[facility] (08657-84696) B12-[facility] (29528-41324) TSH-[facility] (40102-72536) Sed Rate (ESR)-[facility] (64403) CBC-[facility] (47425)   Problem # 2:  MYALGIA/MYOSITIS NOS (ICD-729.1) Assessment: New  Orders: [facility]- Est Level  3 (95638)  Her updated medication list for this problem includes:    Flexeril 10 Mg Tabs (Cyclobenzaprine hcl) .Marland Kitchen... 1 by mouth at bedtime    Bayer Childrens Aspirin 81 Mg Chew (Aspirin) ..... Once daily   Complete Medication List: 1)  Allegra 180 Mg Tabs (Fexofenadine hcl) .... Take 1 tablet by mouth once a day 2)  Nexium 40 Mg Cpdr (Esomeprazole magnesium) .... Take 1 capsule once a day 3)  Lanoxin 0.25 Mg Tabs (Digoxin) .Marland Kitchen.. 1 by mouth once daily 4)  Lexapro 20 Mg Tabs (Escitalopram oxalate) .Marland Kitchen.. 1 by mouth once daily 5)  Flexeril 10 Mg Tabs (Cyclobenzaprine hcl) .Marland Kitchen.. 1 by mouth at bedtime 6)  Trazodone Hcl 100 Mg Tabs (Trazodone hcl) .... 2 by mouth at bedtime 7)  Bayer Childrens Aspirin 81 Mg Chew (Aspirin) .... Once daily     ]  Appended  Document: PER [facility]     Vital Signs:  Patient Profile:   60 Years Old Female Weight:      185 pounds Pulse rate:   80 / minute Pulse rhythm:   regular BP sitting:   105 / 70                         Complete Medication List: 1)  Allegra 180 Mg Tabs (Fexofenadine hcl) .... Take 1 tablet by mouth once a day 2)  Nexium 40 Mg Cpdr (Esomeprazole magnesium) .... Take 1 capsule once a day 3)  Lanoxin 0.25 Mg Tabs (Digoxin) .Marland Kitchen.. 1 by mouth once daily 4)  Lexapro 20 Mg Tabs (Escitalopram oxalate) .Marland Kitchen.. 1 by mouth once daily 5)  Flexeril 10 Mg Tabs (Cyclobenzaprine hcl) .Marland Kitchen.. 1 by mouth at bedtime 6)  Trazodone Hcl 100 Mg Tabs (Trazodone hcl) .... 2 by mouth at bedtime 7)  Bayer Childrens Aspirin 81 Mg Chew (Aspirin) .... Once daily     ]  Appended Document: PER  [facility] also c/o some left hip pain radiating to groin. will follow

## 2010-10-25 NOTE — Letter (Signed)
Summary: Outpatient Coinsurance Notice  Outpatient Coinsurance Notice   Imported By: Marylou Mccoy 01/19/2010 16:17:26  _____________________________________________________________________  External Attachment:    Type:   Image     Comment:   External Document

## 2010-10-25 NOTE — Consult Note (Signed)
Summary: [facility]  [facility]   Imported By: Knox Royalty 07/27/2010 09:35:22  _____________________________________________________________________  External Attachment:    Type:   Image     Comment:   External Document

## 2010-10-25 NOTE — Miscellaneous (Signed)
  Clinical Lists Changes labs for Dr [name] fax to [phone] Orders: Added new Test order of TSH-[facility] [id number]) - Signed Added new Test order of Miscellaneous Lab Charge-[facility] [id number]) - Signed      Complete Medication List: 1)  Allegra 180 Mg Tabs (Fexofenadine hcl) .... Take 1 tablet by mouth once a day 2)  Nexium 40 Mg Cpdr (Esomeprazole magnesium) .... Take 1 capsule once a day 3)  Lanoxin 0.25 Mg Tabs (Digoxin) .Marland Kitchen.. 1 by mouth once daily 4)  Celexa 40 Mg Tabs (Citalopram hydrobromide) .Marland Kitchen.. 1 by mouth qd 5)  Flexeril 10 Mg Tabs (Cyclobenzaprine hcl) .Marland Kitchen.. 1 by mouth two times a day or three times a day 6)  Trazodone Hcl 100 Mg Tabs (Trazodone hcl) .... 2 by mouth at bedtime 7)  Bayer Childrens Aspirin 81 Mg Chew (Aspirin) .... Once daily 8)  Relpax 40 Mg Tabs (Eletriptan hydrobromide) .Marland Kitchen.. 1 at beginniing of headache and may repeat once in 2 hours, max 80 mg in 24 hours 9)  Fosamax 70 Mg Tabs (Alendronate sodium) .Marland Kitchen.. 1 by mouth qweek 10)  Miralax Powd (Polyethylene glycol 3350) .Marland Kitchen.. 1 scoop once daily disp 1 month or 3 months 11)  Wellbutrin Xl 150 Mg Xr24h-tab (Bupropion hcl) .Marland Kitchen.. 1 by mouth qd

## 2010-10-25 NOTE — Letter (Signed)
Summary: [facility] New Pt Evaluation  [facility] New Pt Evaluation   Imported By: Roderic Ovens 11/03/2009 15:06:45  _____________________________________________________________________  External Attachment:    Type:   Image     Comment:   External Document

## 2010-10-25 NOTE — Miscellaneous (Signed)
  Clinical Lists Changes Long discussion w Devony today re her multiple issues--she is still having horrible hip pain but cannot successfullly take narcotics much as they give her a terrible headache and she does not want to take them at work anyway. She has started 800 ibuprofen but not always taking it q 8, so we will either move her to 2 otc naprosyn two times a day or schedule 800 ibu every 8. May be easier to do the two times a day dose of naprosyn. Will add 50-100 mg toradol on top of that--a dose at least at at bedtime and in am. Will continue her current antidepressants and advise 1/2 -1 diazepam most nights as she is terribly anxious and having a lot of muscle spasm in her hip and this will help both. She does admit to occasional suicidal thoughts, never any sincee desire to follow through---moe a helpless feeling taht she is going to have to deal with all of these things chronically and never get better. She contracts for safety with me, will let me know if any worsening of this or the beginning of intent or plan.  Is being followed by endocrine for her thyroid resection for cancer. Is holding on doing anything about replacing her THR as she just does NOT want another big surgery right now. Has appt with cardiology today re her recent syncope (new cardiologist).

## 2010-10-25 NOTE — Miscellaneous (Signed)
Summary: Sinusitis symptoms  Clinical Lists Changes Reign and I discussed cold symptoms for 9 days.  Cough, facial pain.  Hx of allergies and takes daily zyrtec.  Afebrile.  Lungs clear.  TMs normal.  Throat injected.  No significant nodes.  Does have fullness of thyroid on Lt wing.  Discussed possible allergy testing.  Will rx as sinusitis. Problems: Added new problem of ALLERGIC RHINITIS (ICD-477.9) - Signed Added new problem of SINUSITIS, ACUTE (ICD-461.9) - Signed Added new problem of FATIGUE (ICD-780.79) - Signed Medications: Added new medication of AMOXICILLIN-POT CLAVULANATE 875-125 MG TABS (AMOXICILLIN-POT CLAVULANATE) one by mouth two times a day - Signed Rx of AMOXICILLIN-POT CLAVULANATE 875-125 MG TABS (AMOXICILLIN-POT CLAVULANATE) one by mouth two times a day;  #14 x 0;  Signed;  Entered by: Doralee Albino MD;  Authorized by: Doralee Albino MD;  Method used: Electronically to [facility]*, [address], [address] Shipping/mailing, [place], [place]  [address], Ph: [phone], Fax: [phone] Orders: Added new Test order of TSH-[facility] [phone]) - Signed Added new Test order of Miscellaneous Lab Charge-[facility] [id number]) - Signed    Prescriptions: AMOXICILLIN-POT CLAVULANATE 875-125 MG TABS (AMOXICILLIN-POT CLAVULANATE) one by mouth two times a day  #14 x 0   Entered and Authorized by:   Doralee Albino MD   Signed by:   Doralee Albino MD on 01/30/2009   Method used:   Electronically to        [facility]* (retail)       [address]       [address] [place] [address]       [place], [place]  [address]       Ph: [phone]       Fax: [phone]   RxID:   [id number]

## 2010-10-25 NOTE — Assessment & Plan Note (Signed)
Summary: Cardiology Nuclear Study  Nuclear Med Background Indications for Stress Test: Evaluation for Ischemia, Surgical Clearance  Indications Comments: Pending THR (aseptic necrosis) by   History Comments: Hx.Atrial Tachycardia, and chronic hypotension.( symptoms improved on flecainide).  Symptoms: DOE, Near Syncope, Palpitations, SOB    Nuclear Pre-Procedure Caffeine/Decaff Intake: None NPO After: 8:00 PM Lungs: clear IV 0.9% NS with Angio Cath: 20g     IV Site: (R) AC IV Started by: Irean Hong RN Chest Size (in) 38     Cup Size D     Height (in): 68 Weight (lb): 194 BMI: 29.60 Tech Comments: Tylenol requested by patient, 500mg  by mouth given for headache, and herbal tea post lexiscan.  Nuclear Med Study 1 or 2 day study:  1 day     Stress Test Type:  Eugenie Birks Reading MD:  Marca Ancona, MD     Referring MD:  S.Klein Resting Radionuclide:  Technetium 84m Tetrofosmin     Resting Radionuclide Dose:  11 mCi  Stress Radionuclide:  Technetium 38m Tetrofosmin     Stress Radionuclide Dose:  33 mCi   Stress Protocol   Lexiscan: 0.4 mg   Stress Test Technologist:  Milana Na EMT-P     Nuclear Technologist:  Burna Mortimer Deal RT-N  Rest Procedure  Myocardial perfusion imaging was performed at rest 45 minutes following the intravenous administration of Myoview Technetium 7m Tetrofosmin.  Stress Procedure  The patient received IV Lexiscan 0.4 mg over 15-seconds.  Myoview injected at 30-seconds.  There were no significant changes with infusion.  Quantitative spect images were obtained after a 45 minute delay.  QPS Raw Data Images:  Normal; no motion artifact; normal heart/lung ratio. Stress Images:  Small apical perfusion defect.  Rest Images:  Small apical perfusion defect.  Subtraction (SDS):  No evidence of ischemia. Transient Ischemic Dilatation:  .95  (Normal <1.22)  Lung/Heart Ratio:  .32  (Normal <0.45)  Quantitative Gated Spect Images QGS EDV:  100 ml QGS ESV:   25 ml QGS EF:  75 % QGS cine images:  Normal wall motion.    Overall Impression  Exercise Capacity: Lexiscan study  BP Response: Normal blood pressure response. Clinical Symptoms: Chest pressure ECG Impression: No significant ST segment change suggestive of ischemia. Overall Impression: Small fixed apical defect is likely apical thinning.  No evidence for ischemia.  Normal study.

## 2010-10-25 NOTE — Miscellaneous (Signed)
  Clinical Lists Changes pt having increased anxiety re upcoming thr--her family is making this worse--she ha living will and hpoa all fixed up. Feeling sob and chest tightness at times esp in evening--sometimes can talk herself out of it other times kind of stays low level anxiety we discussd and I wil give her samll amt valium--take only when needed and not at work. Medications: Added new medication of VALIUM 5 MG TABS (DIAZEPAM) 1 by mouth two times a day prn - Signed Rx of VALIUM 5 MG TABS (DIAZEPAM) 1 by mouth two times a day prn;  #20 x 0;  Signed;  Entered by: Denny Levy MD;  Authorized by: Denny Levy MD;  Method used: Handwritten    Prescriptions: VALIUM 5 MG TABS (DIAZEPAM) 1 by mouth two times a day prn  #20 x 0   Entered and Authorized by:   Denny Levy MD   Signed by:   Denny Levy MD on 06/21/2008   Method used:   Handwritten   RxID:   [id number]

## 2010-10-25 NOTE — Miscellaneous (Signed)
Summary: PNA vaccine  Clinical Lists Changes given pneumoccal vaccine before discharge from hospital on 02/23/09.Golden Circle RN  February 28, 2009 11:57 AM

## 2010-10-25 NOTE — Miscellaneous (Signed)
  Clinical Lists Changes  Orders: Added new Test order of MRI (MRI) - Signed  continued worsening of hip pain.Ready to move forward w MRI and second opinion. Given the susp[icion of labral tear, will order hip arthrogram.  DEAR RED TEAM:  please set up as above. Note this is an MRI-ARTHROGRAM not a plain MRI Thanks  SARA NEAL MD  April 04, 2008 10:08 AM  appt 7/17 at 2:45pm..Nadea CALDWELL RN  April 04, 2008 10:57 AM

## 2010-10-25 NOTE — Assessment & Plan Note (Signed)
Summary: ? atshma    Complete Medication List: 1)  Allegra 180 Mg Tabs (Fexofenadine hcl) .... Take 1 tablet by mouth once a day 2)  Nexium 40 Mg Cpdr (Esomeprazole magnesium) .... Take 1 capsule once a day 3)  Lanoxin 0.25 Mg Tabs (Digoxin) .Marland Kitchen.. 1 by mouth once daily 4)  Celexa 40 Mg Tabs (Citalopram hydrobromide) .Marland Kitchen.. 1 by mouth qd 5)  Flexeril 10 Mg Tabs (Cyclobenzaprine hcl) .Marland Kitchen.. 1 by mouth two times a day or three times a day 6)  Trazodone Hcl 100 Mg Tabs (Trazodone hcl) .... 2 by mouth at bedtime 7)  Bayer Childrens Aspirin 81 Mg Chew (Aspirin) .... Once daily 8)  Relpax 40 Mg Tabs (Eletriptan hydrobromide) .Marland Kitchen.. 1 at beginniing of headache and may repeat once in 2 hours, max 80 mg in 24 hours 9)  Fosamax 70 Mg Tabs (Alendronate sodium) .Marland Kitchen.. 1 by mouth qweek 10)  Miralax Powd (Polyethylene glycol 3350) .Marland Kitchen.. 1 scoop once daily disp 1 month or 3 months 11)  Ventolin Hfa 108 (90 Base) Mcg/act Aers (Albuterol sulfate) .... 2 puffs three times a day as needed disp 1 mdi 12)  Azithromycin 250 Mg Tabs (Azithromycin) .Marland Kitchen.. 1 by mouth once daily for four days start june 2 13)  Prednisone 50 Mg Tabs (Prednisone) .Marland Kitchen.. 1 by mouth once daily for five days  Other Orders: ENT Referral (ENT)

## 2010-10-25 NOTE — Miscellaneous (Signed)
Summary: serum cobalt and chromium  Clinical Lists Changes  Observations: Added new observation of RESULTS MISC: chromium 9.0 (1.0) ppb cobalt 11 (normal 1.0 ppb) serum levels following hip replacement followed by Dr Luiz Blare (10/04/2009 11:30)      MISC. Report  Procedure date:  10/04/2009  Findings:      chromium 9.0 (1.0) ppb cobalt 11 (normal 1.0 ppb) serum levels following hip replacement followed by Dr Luiz Blare

## 2010-10-25 NOTE — Letter (Signed)
Summary: Generic Letter  [facility]  [address]   [place], [place] [address]   Phone: [phone]  Fax: [phone]    03/21/2008 TO: Sung Amab[facility] [address] [place], [place] [address]   AMPARO DONALSON [address] [place], [place]  [address]  Dear Ms Omelia Blackwater,  MS Elijah Birk has developed arthritis in her hip with a possible labral tear. She also has a right rotator cuff injury in her shoulder.She will be unable to participate in weight training and in any cardiovascular exercise requiring lower extremity work or shoulder motion. The injury is most likely permanent and the start date was March 10,2009. I send you this information at her request as I understand she is withdrawing her membership from your fitness center. Her withdrawal is for medical reasons.         Sincerely,   Denny Levy MD [facility]

## 2010-10-25 NOTE — Miscellaneous (Signed)
  Clinical Lists Changes left lobe thyroidectomy--follicular cyst and thyroiditis. Neg for malignancy. Medications: Removed medication of PREDNISONE 50 MG TABS (PREDNISONE) 1 by mouth once daily for five days Removed medication of AZITHROMYCIN 250 MG TABS (AZITHROMYCIN) 1 by mouth once daily for four days start June 2 Removed medication of VENTOLIN HFA 108 (90 BASE) MCG/ACT AERS (ALBUTEROL SULFATE) 2 puffs three times a day as needed disp 1 mdi Observations: Added new observation of PAST SURG HX: Cholecystectomy Hysterectomy Left lobe thyroidectomy--follicular cyst/thyroiditis--no malignancy 04/2009 Dr Lazarus Salines (04/27/2009 8:52)      Complete Medication List: 1)  Allegra 180 Mg Tabs (Fexofenadine hcl) .... Take 1 tablet by mouth once a day 2)  Nexium 40 Mg Cpdr (Esomeprazole magnesium) .... Take 1 capsule once a day 3)  Lanoxin 0.25 Mg Tabs (Digoxin) .Marland Kitchen.. 1 by mouth once daily 4)  Celexa 40 Mg Tabs (Citalopram hydrobromide) .Marland Kitchen.. 1 by mouth qd 5)  Flexeril 10 Mg Tabs (Cyclobenzaprine hcl) .Marland Kitchen.. 1 by mouth two times a day or three times a day 6)  Trazodone Hcl 100 Mg Tabs (Trazodone hcl) .... 2 by mouth at bedtime 7)  Bayer Childrens Aspirin 81 Mg Chew (Aspirin) .... Once daily 8)  Relpax 40 Mg Tabs (Eletriptan hydrobromide) .Marland Kitchen.. 1 at beginniing of headache and may repeat once in 2 hours, max 80 mg in 24 hours 9)  Fosamax 70 Mg Tabs (Alendronate sodium) .Marland Kitchen.. 1 by mouth qweek 10)  Miralax Powd (Polyethylene glycol 3350) .Marland Kitchen.. 1 scoop once daily disp 1 month or 3 months   Past History:  Past Surgical History: Cholecystectomy Hysterectomy Left lobe thyroidectomy--follicular cyst/thyroiditis--no malignancy 04/2009 Dr Lazarus Salines

## 2010-10-25 NOTE — Miscellaneous (Signed)
  Clinical Lists Changes  Medications: Removed medication of ESTRACE 0.1 MG/GM CREA (ESTRADIOL) 1 applicator full per vagina 3-4 x per week disp qs 3 m Changed medication from VALIUM 10 MG TABS (DIAZEPAM) soig 1/2 -1 tab by mouth once daily or two times a day as needed anxiety or back spasm to VALIUM 10 MG TABS (DIAZEPAM) sig 1/2 -1 tab by mouth once daily or two times a day as needed anxiety or back spasm Orders: Added new Test order of Comp Met-[facility] [phone]) - Signed Added new Test order of CBC-[facility] ([id number]) - Signed Added new Test order of TSH-[facility] [id number]) - Signed Added new Test order of Direct LDL-[facility] ([id number]) - Signed Observations: Added new observation of DM PROGRESS: N/A (07/04/2010 14:56) Added new observation of DM FSREVIEW: N/A (07/04/2010 14:56) Added new observation of HTN PROGRESS: N/A (07/04/2010 14:56) Added new observation of HTN FSREVIEW: N/A (07/04/2010 14:56) Added new observation of LIPID PROGRS: N/A (07/04/2010 14:56) Added new observation of LIPID FSREVW: N/A (07/04/2010 14:56)      Complete Medication List: 1)  Nexium 40 Mg Cpdr (Esomeprazole magnesium) .Marland Kitchen.. 1 tab two times a day 2)  Celexa 40 Mg Tabs (Citalopram hydrobromide) .Marland Kitchen.. 1 by mouth qd 3)  Flexeril 10 Mg Tabs (Cyclobenzaprine hcl) .... Take one tablet at bedtime 4)  Trazodone Hcl 100 Mg Tabs (Trazodone hcl) .... 2 by mouth at bedtime 5)  Relpax 40 Mg Tabs (Eletriptan hydrobromide) .Marland Kitchen.. 1 at beginniing of headache and may repeat once in 2 hours, max 80 mg in 24 hours prn 6)  Miralax Powd (Polyethylene glycol 3350) .Marland Kitchen.. 1 scoop once daily disp 1 month or 3 months 7)  Wellbutrin Xl 150 Mg Xr24h-tab (Bupropion hcl) .Marland Kitchen.. 1 by mouth qd 8)  Levothroid 137 Mcg Tabs (Levothyroxine sodium) .... Take one tablet once daily 9)  Valium 10 Mg Tabs (Diazepam) .... Sig 1/2 -1 tab by mouth once daily or two times a day as needed anxiety or back spasm 10)  Tramadol Hcl 50 Mg Tabs (Tramadol hcl) .Marland Kitchen.. 1-2 tabs   q 4-6 hrs as needed hip pain with max of 8 tabs a day. 11)  Flecainide Acetate 100 Mg Tabs (Flecainide acetate) .Marland Kitchen.. 1 tablet 2 times per day       Prevention & Chronic Care Immunizations   Influenza vaccine: Not documented    Tetanus booster: Not documented    Pneumococcal vaccine: Not documented  Colorectal Screening   Hemoccult: Not documented    Colonoscopy: Not documented  Other Screening   Pap smear: Not documented    Mammogram: ASSESSMENT: Negative - BI-RADS 1^MM DIGITAL SCREENING  (08/23/2009)   Smoking status: never  (09/20/2009)  Lipids   Total Cholesterol: Not documented   LDL: Not documented   LDL Direct: Not documented   HDL: Not documented   Triglycerides: Not documented

## 2010-10-25 NOTE — Assessment & Plan Note (Signed)
Summary: foot pain   Vital Signs:  Patient Profile:   61 Years Old Female BP sitting:   100 / 62  Vitals Entered By: Lillia Pauls CMA (December 22, 2007 11:25 AM)                 Chief Complaint:  R FOOT PAIN UNDER BALLS OF FEET.  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 [employer] c/o increase in Rt ant foot pain orhthotic made in fall 2008 these did give significant relief of foot pain adjusted with MT pads in 12/08 and this helped with increased left forefoot pain and metatarsalgia that would flare with minor exercise more recently RT foot shows more tenderness over MT heads 3 and 4 pain under MT head 2 is gone/ toes are lossing ability to flex easily comes to see if we can adjust orthotics to lessen pain        Physical Exam  General:     Well-developed,well-nourished,in no acute distress; alert,appropriate and cooperative throughout examination Head:     Normocephalic and atraumatic without obvious abnormalities. No apparent alopecia or balding. Eyes:     wears glasses Msk:     Bilat transverse arch collapse On RT foot there is callusing over MT heads 2, 3 and 4 MT 3 and 4 appear to be ruptured through capsule so that toes 3 and 4 are flexed at MTP joint slight splaying  pad on rt orthotic is small MT pad and is worn down left reveals small MT pad better shape - no pain on this foot though  RT 3rd and 4th MT shafts somewhat tender    Impression & Recommendations:  Problem # 1:  METATARSALGIA (ICD-726.70) Assessment: Deteriorated removed MT pad on Rt orthotic replaced with a customized MT cookie to give broader support feels better at least initially wear this and we can adjust the padding if this is not fully relieving pain issues Orders: [facility]- Est Level  2 ([id number])   Complete Medication List: 1)  Allegra 180 Mg Tabs (Fexofenadine hcl) .... Take 1 tablet by mouth once a day 2)  Nexium 40 Mg Cpdr (Esomeprazole magnesium) .... Take 1 capsule once a  day 3)  Lanoxin 0.25 Mg Tabs (Digoxin) .Marland Kitchen.. 1 by mouth once daily 4)  Lexapro 20 Mg Tabs (Escitalopram oxalate) .Marland Kitchen.. 1 by mouth once daily 5)  Flexeril 10 Mg Tabs (Cyclobenzaprine hcl) .Marland Kitchen.. 1 by mouth at bedtime 6)  Trazodone Hcl 100 Mg Tabs (Trazodone hcl) .... 2 by mouth at bedtime 7)  Bayer Childrens Aspirin 81 Mg Chew (Aspirin) .... Once daily     ]

## 2010-10-25 NOTE — Miscellaneous (Signed)
  Clinical Lists Changes  Medications: Changed medication from TRAMADOL HCL 100 MG XR24H-TAB (TRAMADOL HCL) 1 tab three or four times a day as needed hip pain to TRAMADOL HCL 50 MG TABS (TRAMADOL HCL) 1-2 tabs  q 4-6 hrs as needed hip pain with max of 8 tabs a day. - Signed Rx of TRAMADOL HCL 50 MG TABS (TRAMADOL HCL) 1-2 tabs  q 4-6 hrs as needed hip pain with max of 8 tabs a day.;  #240 x 5;  Signed;  Entered by: Denny Levy MD;  Authorized by: Denny Levy MD;  Method used: Telephoned to [facility]*, [address] Shipping/mailing, [place], [place]  [address], Ph: [phone], Fax: [phone]    Prescriptions: TRAMADOL HCL 50 MG TABS (TRAMADOL HCL) 1-2 tabs  q 4-6 hrs as needed hip pain with max of 8 tabs a day.  #240 x 5   Entered and Authorized by:   Denny Levy MD   Signed by:   Denny Levy MD on 02/20/2010   Method used:   Telephoned to ...       [facility]* (retail)       [address]       [address] [place] [address]       [place], [place]  [address]       Ph: [phone]       Fax: [phone]   RxID:   [id number]   DEAR Shade Rivenbark TEAM Ok I wrote this wrong the first time--I sent the xr which is NOT what I wanted to do---can u call this and in and clarify Thanks!  Denny Levy MD  Feb 20, 2010 3:56 PM  Rx called into pharm and clarified changes from Rx called in earlier.Gladstone Pih  Feb 20, 2010 4:20 PM

## 2010-10-25 NOTE — Progress Notes (Signed)
Summary: myoview results  Phone Note Outgoing Call Call back at [facility] Phone [phone]   Call placed by: Gypsy Balsam RN BSN,  January 18, 2010 9:58 AM Summary of Call: Called patient and left message on machine to call about myoview results.  Was normal, would be at acceptable cardiac risk for surgery.  Need to see who orthopedic surgeon is to fax clearance letter to. Gypsy Balsam RN BSN  January 18, 2010 9:58 AM   Follow-up for Phone Call        PLEASE CALL PT AT [phone] THIS IS HER WORK NUMBER BUT HAVE THEM FIND HER Jamie Little  January 18, 2010 10:21 AM  Additional Follow-up for Phone Call Additional follow up Details #1::        Pt notified.  Clearance letter sent to Dr Denny Levy. Gypsy Balsam RN BSN  January 18, 2010 10:46 AM

## 2010-10-25 NOTE — Consult Note (Signed)
Summary: [facility]  [facility]   Imported By: Clydell Hakim 05/04/2009 16:24:43  _____________________________________________________________________  External Attachment:    Type:   Image     Comment:   External Document

## 2010-10-25 NOTE — Miscellaneous (Signed)
Summary: MECLIZINE/TS  Clinical Lists Changes CALLED IN MECLIZINE 25MG  three times a day as needed VERTIGO TO [facility] PER S.SAXON .Gloriajean Okun CMA,  October 14, 2008 2:29 PM   Appended Document: MECLIZINE/TS c/o vertigo when turning head to right.  Attempted Epply positioning but had problems on table with new hip and muscles going into spasm.  Instructed to do on own at home on bigger surface, meclizine as needed.

## 2010-10-25 NOTE — Letter (Signed)
Summary: *Referral Letter: ORTHOPEDICS  [facility]  [address]   [place], [place] [address]   Phone: [phone]  Fax: [phone]    04/12/2008 Dr Jodi Geralds [facility] Thank you in advance for agreeing to see my patient:  Clemmie Buelna [address] [place], [place]  [address]  Phone: [phone]  Reason for Referral: LEFT HIP PAIN   Fairly acute increase in hip pain over last 6-8 months. Has had multiple imaging studies , reports included. My concern is teh rather rapid progression to severe hip pain  also with MRI suggestive of AVN although they read more likely oasteoporosis. Appreciate eval/tx   Current Medical Problems: 1)  BREAST MASS, RIGHT (ICD-611.72) 2)  METATARSALGIA (ICD-726.70) 3)  HIP PAIN, LEFT (ICD-719.45) 4)  WELL ADULT EXAM (ICD-V70.0) 5)  GERD (ICD-530.81) 6)  DYSRHYTHMIA, CARDIAC NEC (ICD-427.89) 7)  FIBROMYALGIA (ICD-729.1) 8)  AFTERCARE, LONG-TERM USE, MEDICATIONS NEC (ICD-V58.69)   Current Medications: 1)  ALLEGRA 180 MG TABS (FEXOFENADINE HCL) Take 1 tablet by mouth once a day 2)  NEXIUM 40 MG CPDR (ESOMEPRAZOLE MAGNESIUM) Take 1 capsule once a day 3)  LANOXIN 0.25 MG  TABS (DIGOXIN) 1 by mouth once daily 4)  LEXAPRO 20 MG  TABS (ESCITALOPRAM OXALATE) 1 by mouth once daily 5)  FLEXERIL 10 MG  TABS (CYCLOBENZAPRINE HCL) 1 by mouth at bedtime 6)  TRAZODONE HCL 100 MG  TABS (TRAZODONE HCL) 2 by mouth at bedtime 7)  BAYER CHILDRENS ASPIRIN 81 MG  CHEW (ASPIRIN) once daily 8)  RELPAX 40 MG  TABS (ELETRIPTAN HYDROBROMIDE) 1 at beginniing of headache and may repeat once in 2 hours, max 80 mg in 24 hours 9)  ULTRAM 50 MG  TABS (TRAMADOL HCL) 1-2 by mouth three times a day as needed pain 10)  WELLBUTRIN XL 150 MG  TB24 (BUPROPION HCL) 1 by mouth once daily   Past Medical History: 1)  ulcer as teenager 2)  atrial dysrhythmia followed by cards---on digoxin 3)  fibromyalgia well controlled on flexeril, trazodone and  lexapro      Pertinent Labs: dexxa attached   Thank you again for agreeing to see our patient; please contact us if you have any further questions or need additional information.  Sincerely,  Denny Levy MD

## 2010-10-25 NOTE — Consult Note (Signed)
Summary: [facility]   Imported By: Knox Royalty 07/06/2008 14:58:23  _____________________________________________________________________  External Attachment:    Type:   Image     Comment:   External Document

## 2010-10-25 NOTE — Progress Notes (Signed)
Summary: Records Request  Faxed EKG, Stress & OV to Delice Bison at [facility] ([phone]). Debby Freiberg  July 09, 2010 12:32 PM

## 2010-10-25 NOTE — Assessment & Plan Note (Signed)
Summary: 1 month rov/sl   Visit Type:  Follow-up Primary Provider:  Jennette Kettle  CC:  pt contiues to have palps.  History of Present Illness: [name] is seen in followup for palpitations in the setting of chronic hypotension recurrent presyncope associated with her palpitations. Isn't recording was undertaken. She had some symptoms during the event recorder and some of the recordings were obtained when she was asymptomatic.  She continues to struggle with orthostatic intolerance with blood pressures in the 90 range. She continues salt and water repletion.  Current Medications (verified): 1)  Nexium 40 Mg Cpdr (Esomeprazole Magnesium) .Marland Kitchen.. 1 Tab Two Times A Day 2)  Celexa 40 Mg  Tabs (Citalopram Hydrobromide) .Marland Kitchen.. 1 By Mouth Qd 3)  Flexeril 10 Mg  Tabs (Cyclobenzaprine Hcl) .... Take One Tablet At Bedtime 4)  Trazodone Hcl 100 Mg  Tabs (Trazodone Hcl) .... 2 By Mouth At Bedtime 5)  Relpax 40 Mg  Tabs (Eletriptan Hydrobromide) .Marland Kitchen.. 1 At Beginniing of Headache and May Repeat Once in 2 Hours, Max 80 Mg in 24 Hours Prn 6)  Miralax  Powd (Polyethylene Glycol 3350) .Marland Kitchen.. 1 Scoop Once Daily Disp 1 Month or 3 Months 7)  Wellbutrin Xl 150 Mg Xr24h-Tab (Bupropion Hcl) .Marland Kitchen.. 1 By Mouth Qd 8)  Levothroid 137 Mcg Tabs (Levothyroxine Sodium) .... Take One Tablet Once Daily 9)  Valium 5 Mg Tabs (Diazepam) .... Take One Tablet Once Daily and May Take Second Tablet Once Daily On Occasion 10)  Estrace 0.1 Mg/gm Crea (Estradiol) .Marland Kitchen.. 1 Applicator Full Per Vagina 3-4 X Per Week Disp Qs 3 M 11)  Tramadol Hcl 100 Mg Xr24h-Tab (Tramadol Hcl) .Marland Kitchen.. 1 Tab Two Times A Day 12)  Aleve 220 Mg Tabs (Naproxen Sodium) .... As Needed  Allergies (verified): No Known Drug Allergies  Past History:  Past Medical History: Last updated: 10/04/2009 ulcer as teenager atrial dysrhythmia followed by cards---on digoxin fibromyalgia well controlled on flexeril, trazodone and lexapro Hip Replacement Thyroid Cancer status post   resection on replacement Dr Talmage Nap Anxiety/depression GE reflux disease Arthritis  Past Surgical History: Last updated: 10/04/2009 Cholecystectomy Hysterectomy Thyroidectomy--papillary carcimao Dr Lazarus Salines 04/2009 Left hip replacement with question recall hip prosthesis Breast biopsy Tubal ligation Bone spur right great toe  Family History: Last updated: 10/04/2009 Family History of Cancer:   Social History: Last updated: 09/20/2009 No  tobacco, illegal drugs. Drinks socially only. Exercises daily most of the time works as Engineer, civil (consulting) at [employer]  Risk Factors: Alcohol Use: <1 (02/14/2009) Exercise: yes (02/14/2009)  Risk Factors: Smoking Status: never (09/20/2009)  Vital Signs:  Patient profile:   60 year old female Menstrual status:  hysterectomy Height:      68 inches Weight:      194 pounds BMI:     29.60 Pulse rate:   83 / minute BP sitting:   100 / 64  (left arm) Cuff size:   regular  Vitals Entered By: Burnett Kanaris, CNA (November 14, 2009 9:12 AM)  Physical Exam  General:  The patient was alert and oriented in no acute distress. HEENT Normal.  Neck veins were flat, carotids were brisk.  Lungs were clear.  Heart sounds were regular without murmurs or gallops.  Abdomen was soft with active bowel sounds. There is no clubbing cyanosis or edema. Skin Warm and dry    Event Monitor  Procedure date:  11/14/2009  Findings:      shas 3 episodes recorded. He has a number of atrial tachycardias identified on her monitoring.  One on 2/11 was asymptomatic one on 1/22 preceded activation of the monitor that was associated with presyncope.  Impression & Recommendations:  Problem # 1:  HYPOTENSION, CHRONIC (ICD-458.1) Orthostatic hypotension he remains an issue. We described the use of pressure support stockings. She is uninterested in doing that. She is also a burst to trying calcium blockers and/or beta blockers for her rhythm as she is concerned that they will  aggravate her hypotension.  Problem # 2:  PALPITATIONS, RECURRENT (ICD-785.1) recurrent palpitations seem to correlate with atrial tachycardia. This may also be a trigger for neurally mediated reflexes. We have discussed the use of flecainide as an antiarrhythmic drug with its potential for proarrhythmia notwithstanding we will utilize it to see if we can't control palpitations as well as decrease the likelihood of recurrent syncope and or presyncope. We will review this in 3 months. Her updated medication list for this problem includes:    Flecainide Acetate 50 Mg Tabs (Flecainide acetate) .Marland Kitchen..Marland Kitchen Two times a day  Problem # 3:  SYNCOPE (ICD-780.2) C. the above Her updated medication list for this problem includes:    Flecainide Acetate 50 Mg Tabs (Flecainide acetate) .Marland Kitchen..Marland Kitchen Two times a day  Patient Instructions: 1)  Your physician has recommended you make the following change in your medication: START FLECAINIDE 50MG  two times a day  2)  Your physician recommends that you schedule a follow-up appointment in: 3 MONTHS Prescriptions: FLECAINIDE ACETATE 50 MG TABS (FLECAINIDE ACETATE) two times a day  #60 x 6   Entered by:   Duncan Dull, RN, BSN   Authorized by:   Nathen May, MD, [facility]   Signed by:   Duncan Dull, RN, BSN on 11/14/2009   Method used:   Electronically to        [facility]* (retail)       [address]       [address] Shipping/mailing       [place], [place]  [address]       Ph: [phone]       Fax: [phone]   RxID:   [id number]

## 2010-10-25 NOTE — Miscellaneous (Signed)
  Clinical Lists Changes Saw ortho--she has AVN of hip. they want her on crutches, will start fossamax and ultimately will need THR fairly soon.  The dilaudid we tried made her goofy and sort of hallucinate but did really NOTHINg for pain. Dr Luiz Blare gave gher some percosette 7.5 / 500 but the pharmacy is out of that. Iwil write percocette 7.5/325 #30 no refill and see how that does. She has f/u w Dr Luiz Blare in 1 m. Problems: Changed problem from HIP PAIN, LEFT (ICD-719.45) to ASEPTIC NECROSIS OF HEAD AND NECK OF FEMUR (ICD-733.42) Medications: Changed medication from DILAUDID 2 MG  TABS (HYDROMORPHONE HCL) 1-2 by mouth at bedtime as needed hip pain to PERCOCET 7.5-325 MG  TABS (OXYCODONE-ACETAMINOPHEN) 1-2 by mouth q 6 hrs as needed hip pain Added new medication of FOSAMAX 70 MG  TABS (ALENDRONATE SODIUM) 1 by mouth qweek

## 2010-10-25 NOTE — Consult Note (Signed)
Summary: ENDOCRINE  [facility]   Imported By: Clydell Hakim 09/04/2009 10:45:23  _____________________________________________________________________  External Attachment:    Type:   Image     Comment:   External Document

## 2010-10-25 NOTE — Miscellaneous (Signed)
Summary: Living Will  Living Will   Imported By: Denny Peon LEVAN 08/14/2007 10:42:41  _____________________________________________________________________  External Attachment:    Type:   Image     Comment:   External Document

## 2010-10-25 NOTE — Letter (Signed)
Summary: Medical release from fitness center. Letter  [facility]  [address]   [place], [place] [address]   Phone: [phone]  Fax: [phone]    03/21/2008 TO: [facility] [address] [place], [place] [address]   MW:UXLKG [address] [place], [place]  [address]  Dear Rebecca Blackwater,  MS Rebecca Marshall has developed arthritis in her hip with a possible labral tear. She will be unable to participate in weight training and in any cardiovascular exercise requiring lower extremity work. This injury is most likely permanent and the start date was March 10,2009. I send you this information at her request as I understand she is withdrawing her membership from your fitness center. Her withdrawal is for medical reasons.           Sincerely,   Denny Levy MD [facility]

## 2010-10-25 NOTE — Consult Note (Signed)
Summary: [facility]  [facility]   Imported By: Haydee Salter 10/19/2008 10:08:51  _____________________________________________________________________  External Attachment:    Type:   Image     Comment:   External Document

## 2010-10-25 NOTE — Consult Note (Signed)
Summary: [facility]  [facility]   Imported By: Clydell Hakim 09/13/2009 12:30:29  _____________________________________________________________________  External Attachment:    Type:   Image     Comment:   External Document

## 2010-10-25 NOTE — Consult Note (Signed)
Summary: [facility]  [facility]   Imported By: Clydell Hakim 06/07/2009 12:15:32  _____________________________________________________________________  External Attachment:    Type:   Image     Comment:   External Document

## 2010-10-25 NOTE — Assessment & Plan Note (Signed)
    History of Present Illness: r breast lump 1 month --at site of excosion of mass (fibroadenoma) years ago. no nipple d/c. not really painful.        Physical Exam  Breasts:     Left breast normal with some mild fibrocystic changes. R breast has mod severe fibrocystic change with u=increased fullnedd r upper outer quaddrant although a definitive mass can not be described as no edges are palpable.Mobile, noon tender. Breast look symmetrical, no skin or nipple change. Well healed small scar r breast ruo quadrant    Impression & Recommendations:  Problem # 1:  BREAST MASS, RIGHT (ICD-611.72)  Orders: Mammogram (Diagnostic) (Mammo)   Complete Medication List: 1)  Allegra 180 Mg Tabs (Fexofenadine hcl) .... Take 1 tablet by mouth once a day 2)  Nexium 40 Mg Cpdr (Esomeprazole magnesium) .... Take 1 capsule once a day 3)  Lanoxin 0.25 Mg Tabs (Digoxin) .Marland Kitchen.. 1 by mouth once daily 4)  Lexapro 20 Mg Tabs (Escitalopram oxalate) .Marland Kitchen.. 1 by mouth once daily 5)  Flexeril 10 Mg Tabs (Cyclobenzaprine hcl) .Marland Kitchen.. 1 by mouth at bedtime 6)  Trazodone Hcl 100 Mg Tabs (Trazodone hcl) .... 2 by mouth at bedtime 7)  Bayer Childrens Aspirin 81 Mg Chew (Aspirin) .... Once daily     ]

## 2010-10-25 NOTE — Miscellaneous (Signed)
  Clinical Lists Changes  Medications: Added new medication of PREMARIN 0.625 MG/GM CREA (ESTROGENS, CONJUGATED) 1 applicator full per vagina 3-4 nigts a week as needed vaginal dryness disp 1 qs 1 month - Signed Rx of PREMARIN 0.625 MG/GM CREA (ESTROGENS, CONJUGATED) 1 applicator full per vagina 3-4 nigts a week as needed vaginal dryness disp 1 qs 1 month;  #1 x 12;  Signed;  Entered by: Denny Levy MD;  Authorized by: Denny Levy MD;  Method used: Electronically to [facility]*, [address] Shipping/mailing, [place], [place]  [address], Ph: [phone], Fax: [phone]    Prescriptions: PREMARIN 0.625 MG/GM CREA (ESTROGENS, CONJUGATED) 1 applicator full per vagina 3-4 nigts a week as needed vaginal dryness disp 1 qs 1 month  #1 x 12   Entered and Authorized by:   Denny Levy MD   Signed by:   Denny Levy MD on 10/31/2009   Method used:   Electronically to        [facility]* (retail)       [address]       [address] Shipping/mailing       [place], [place]  [address]       Ph: [phone]       Fax: [phone]   RxID:   [id number]

## 2010-10-25 NOTE — Miscellaneous (Signed)
Summary: ORDER:  DEXXA  Clinical Lists Changes  Problems: Added new problem of WELL ADULT EXAM (ICD-V70.0) Assessed WELL ADULT EXAM as unchanged Orders: Added new Test order of Dexa scan (Dexa scan) - Signed       Impression & Recommendations:  Problem # 1:  WELL ADULT EXAM (ICD-V70.0) Assessment: Unchanged  Orders: Dexa scan (Dexa scan)   Complete Medication List: 1)  Allegra 180 Mg Tabs (Fexofenadine hcl) .... Take 1 tablet by mouth once a day 2)  Nexium 40 Mg Cpdr (Esomeprazole magnesium) .... Take 1 capsule once a day 3)  Lanoxin 0.25 Mg Tabs (Digoxin) .Marland Kitchen.. 1 by mouth once daily 4)  Lexapro 20 Mg Tabs (Escitalopram oxalate) .Marland Kitchen.. 1 by mouth once daily 5)  Flexeril 10 Mg Tabs (Cyclobenzaprine hcl) .Marland Kitchen.. 1 by mouth at bedtime 6)  Trazodone Hcl 100 Mg Tabs (Trazodone hcl) .... 2 by mouth at bedtime 7)  Bayer Childrens Aspirin 81 Mg Chew (Aspirin) .... Once daily

## 2010-10-25 NOTE — Assessment & Plan Note (Signed)
Vital Signs:  Patient profile:   60 year old female Menstrual status:  hysterectomy O2 Sat:      95 % on Room air Temp:     98.3 degrees F oral Pulse rate:   85 / minute Pulse rhythm:   regular BP sitting:   117 / 72  (left arm) Cuff size:   large  Vitals Entered By: Arlyss Repress CMA, (February 21, 2009 9:12 AM)  O2 Flow:  Room air  History of Present Illness: cough worsened over last 3 days--had some fever at home w some sweats. taking ibuprofen for that. Cough not productive but having coughing "spasms" where she has difficulty stopping. Feels very fatigued. Feels like she has Pneumonia--just like when she had it several years ago. Difficulty catching her breath if trying to get dressed or walking.  Denies sore throat, abdominal pain.  Physical Exam  General:  ill appearing Mouth:  pharynx pink and moist.   Lungs:  rhonchourous coarse breath sounds heard throughout all lung fields. no wheeze --no area of dullness, no crackles or wheeze. Heart:  normal rate and no murmur.   Abdomen:  soft and non-tender.     Impression & Recommendations:  Problem # 1:  ACUTE BRONCHOSPASM (ICD-519.11)  Orders: CXR- 2view (CXR) Albuterol Sulfate Sol 1mg  unit dose (Z6109) Atrovent 1mg  (Neb) [phone]) Rocephin  250mg  (U9811) concern for atypical pneumonia  breathing tx seemed to help. will start steroids, coverinitiallly w rocephin although this does not appear to be bacterial, tx for atypicals w azithromycin first dose given in clinic, Steroid burst as this may be just bronchospasm--she did improve some from teh breathing tx but the mdi I gac=ve her last week has not seemed to make much difference. She will f/u by phone in am. Go to ED w worseing signs if after hours.  Complete Medication List: 1)  Allegra 180 Mg Tabs (Fexofenadine hcl) .... Take 1 tablet by mouth once a day 2)  Nexium 40 Mg Cpdr (Esomeprazole magnesium) .... Take 1 capsule once a day 3)  Lanoxin 0.25 Mg Tabs (Digoxin) .Marland Kitchen.. 1  by mouth once daily 4)  Celexa 40 Mg Tabs (Citalopram hydrobromide) .Marland Kitchen.. 1 by mouth qd 5)  Flexeril 10 Mg Tabs (Cyclobenzaprine hcl) .Marland Kitchen.. 1 by mouth two times a day or three times a day 6)  Trazodone Hcl 100 Mg Tabs (Trazodone hcl) .... 2 by mouth at bedtime 7)  Bayer Childrens Aspirin 81 Mg Chew (Aspirin) .... Once daily 8)  Relpax 40 Mg Tabs (Eletriptan hydrobromide) .Marland Kitchen.. 1 at beginniing of headache and may repeat once in 2 hours, max 80 mg in 24 hours 9)  Fosamax 70 Mg Tabs (Alendronate sodium) .Marland Kitchen.. 1 by mouth qweek 10)  Miralax Powd (Polyethylene glycol 3350) .Marland Kitchen.. 1 scoop once daily disp 1 month or 3 months 11)  Ventolin Hfa 108 (90 Base) Mcg/act Aers (Albuterol sulfate) .... 2 puffs three times a day as needed disp 1 mdi 12)  Azithromycin 250 Mg Tabs (Azithromycin) .Marland Kitchen.. 1 by mouth once daily for four days start june 2 13)  Prednisone 50 Mg Tabs (Prednisone) .Marland Kitchen.. 1 by mouth once daily for five days  Other Orders: CBC w/Diff-[facility] (91478) Miscellaneous Lab Charge-[facility] (29562) Solumedrol up to 40mg  (Z3086) Azithromycin oral (Q0144) [facility]- Est  Level 4 (57846)  Prescriptions: PREDNISONE 50 MG TABS (PREDNISONE) 1 by mouth once daily for five days  #5 x 0   Entered and Authorized by:   Denny Levy MD   Signed  by:   Denny Levy MD on 02/21/2009   Method used:   Electronically to        [facility]* (retail)       [address]       [address] Shipping/mailing       [place], [place]  [address]       Ph: [phone]       Fax: [phone]   RxID:   [id number] AZITHROMYCIN 250 MG TABS (AZITHROMYCIN) 1 by mouth once daily for four days start June 2  #4 x 0   Entered and Authorized by:   Denny Levy MD   Signed by:   Denny Levy MD on 02/21/2009   Method used:   Electronically to        [facility]* (retail)       [address]       [address] Shipping/mailing       [place], [place]  [address]       Ph: [phone]       Fax: [phone]   RxID:    [id number]    Medication Administration  Injection # 1:    Medication: Rocephin  250mg     Diagnosis: ACUTE BRONCHOSPASM (ICD-519.11)    Route: IM    Site: RUOQ gluteus    Exp Date: 04/24/2011    Lot #: [id number]    Mfr: Sandoz    Comments: Rocephin 1 gram given per order Dr. Jennette Kettle.    Patient tolerated injection without complications    Given by: Modesta Messing LPN (February 21, 629 9:17 AM)  Injection # 2:    Medication: Solumedrol up to 40mg     Diagnosis: ACUTE BRONCHOSPASM (ICD-519.11)    Route: IM    Site: LUOQ gluteus    Exp Date: 01/2010    Lot #: [id number]    Mfr: Pharmacia    Patient tolerated injection without complications    Given by: Alphia Kava (February 21, 2009 10:09 AM)  Medication # 1:    Medication: Albuterol Sulfate Sol 1mg  unit dose    Diagnosis: ACUTE BRONCHOSPASM (ICD-519.11)    Dose: 2.5mg /28ml    Route: inhaled    Exp Date: 07/24/2010    Lot #: [id number]    Mfr: nephron    Patient tolerated medication without complications    Given by: Arlyss Repress CMA, (February 21, 2009 9:13 AM)  Medication # 2:    Medication: Atrovent 1mg  (Neb)    Diagnosis: ACUTE BRONCHOSPASM (ICD-519.11)    Dose: 0.02%/0.5mg     Route: inhaled    Exp Date: 12/22/2009    Lot #: [id number]    Mfr: nephron    Patient tolerated medication without complications    Given by: Arlyss Repress CMA, (February 21, 2009 9:15 AM)  Medication # 3:    Medication: Azithromycin oral    Diagnosis: ACUTE BRONCHOSPASM (ICD-519.11)    Dose: 7.5 mg    Route: po    Exp Date: 03/11    Lot #: [id number]    Mfr: greenstone    Patient tolerated medication without complications    Given by: Alphia Kava (February 21, 2009 10:10 AM)  Orders Added: 1)  CXR- 2view [CXR] 2)  Albuterol Sulfate Sol 1mg  unit dose [J7613] 3)  Atrovent 1mg  (Neb) [Y7062] 4)  CBC w/Diff-[facility] [85025] 5)  Rocephin  250mg  [J0696] 6)  Miscellaneous Lab Charge-[facility] [99999] 7)  Solumedrol up to 40mg  [J2920]  8)  Azithromycin oral [Q0144] 9)  [facility]- Est   Level 4 [[address]]   Medication Administration  Injection # 1:    Medication: Rocephin  250mg     Diagnosis: ACUTE BRONCHOSPASM (ICD-519.11)    Route: IM    Site: RUOQ gluteus    Exp Date: 04/24/2011    Lot #: [id number]    Mfr: Sandoz    Comments: Rocephin 1 gram given per order Dr. Jennette Kettle.    Patient tolerated injection without complications    Given by: Modesta Messing LPN (February 21, 9810 9:17 AM)  Injection # 2:    Medication: Solumedrol up to 40mg     Diagnosis: ACUTE BRONCHOSPASM (ICD-519.11)    Route: IM    Site: LUOQ gluteus    Exp Date: 01/2010    Lot #: [id number]    Mfr: Pharmacia    Patient tolerated injection without complications    Given by: Alphia Kava (February 21, 2009 10:09 AM)  Medication # 1:    Medication: Albuterol Sulfate Sol 1mg  unit dose    Diagnosis: ACUTE BRONCHOSPASM (ICD-519.11)    Dose: 2.5mg /1ml    Route: inhaled    Exp Date: 07/24/2010    Lot #: [id number]    Mfr: nephron    Patient tolerated medication without complications    Given by: Arlyss Repress CMA, (February 21, 2009 9:13 AM)  Medication # 2:    Medication: Atrovent 1mg  (Neb)    Diagnosis: ACUTE BRONCHOSPASM (ICD-519.11)    Dose: 0.02%/0.5mg     Route: inhaled    Exp Date: 12/22/2009    Lot #: [id number]    Mfr: nephron    Patient tolerated medication without complications    Given by: Arlyss Repress CMA, (February 21, 2009 9:15 AM)  Medication # 3:    Medication: Azithromycin oral    Diagnosis: ACUTE BRONCHOSPASM (ICD-519.11)    Dose: 7.5 mg    Route: po    Exp Date: 03/11    Lot #: [id number]    Mfr: greenstone    Patient tolerated medication without complications    Given by: Alphia Kava (February 21, 2009 10:10 AM)  Orders Added: 1)  CXR- 2view [CXR] 2)  Albuterol Sulfate Sol 1mg  unit dose [J7613] 3)  Atrovent 1mg  (Neb) [O9629] 4)  CBC w/Diff-[facility] [85025] 5)  Rocephin  250mg  [J0696] 6)  Miscellaneous Lab Charge-[facility] [99999] 7)  Solumedrol up to 40mg  [J2920] 8)  Azithromycin oral [Q0144] 9)  [facility]- Est   Level 4 [52841]

## 2010-10-25 NOTE — Consult Note (Signed)
Summary: [facility]  [facility]   Imported By: Rebecca Marshall 05/23/2009 16:32:04  _____________________________________________________________________  External Attachment:    Type:   Image     Comment:   External Document

## 2010-10-25 NOTE — Miscellaneous (Signed)
  Clinical Lists Changes Reviewed MRI w Caeli--her course of hip pain has been rapidly progressive.m The MRI does show some labral fraying but no tear. Some edema also noted in femoral head, read by radiology as most likely consistent with osteoarthritis although AVN was mentioned. She is havong severe pain that awakens her several times a night---the ultram helps but does not treat it adequately enough for her to be comfortable. She gets headaches and nausea from the vicodin--really wants to try something different, something stronger. Also ready for second opinion. will try her on dilaudid at night. Set up second ortho opinion--she did not like the last doctor at all. Medications: Added new medication of DILAUDID 2 MG  TABS (HYDROMORPHONE HCL) 1-2 by mouth at bedtime as needed hip pain - Signed Rx of DILAUDID 2 MG  TABS (HYDROMORPHONE HCL) 1-2 by mouth at bedtime as needed hip pain;  #60 x 0;  Signed;  Entered by: Denny Levy MD;  Authorized by: Denny Levy MD;  Method used: Handwritten    Prescriptions: DILAUDID 2 MG  TABS (HYDROMORPHONE HCL) 1-2 by mouth at bedtime as needed hip pain  #60 x 0   Entered and Authorized by:   Denny Levy MD   Signed by:   Denny Levy MD on 04/12/2008   Method used:   Handwritten   RxID:   [id number]   Appended Document:     Clinical Lists Changes  Orders: Added new Referral order of Orthopedic Surgeon Referral (Ortho Surgeon) - Signed

## 2010-10-25 NOTE — Miscellaneous (Signed)
  Clinical Lists Changes  Orders: Added new Test order of TSH-[facility] [phone]) - Signed Added new Test order of Miscellaneous Lab Charge-[facility] [phone]) - Signed      Complete Medication List: 1)  Nexium 40 Mg Cpdr (Esomeprazole magnesium) .Marland Kitchen.. 1 tab two times a day 2)  Celexa 40 Mg Tabs (Citalopram hydrobromide) .Marland Kitchen.. 1 by mouth qd 3)  Flexeril 10 Mg Tabs (Cyclobenzaprine hcl) .... Take one tablet at bedtime 4)  Trazodone Hcl 100 Mg Tabs (Trazodone hcl) .... 2 by mouth at bedtime 5)  Relpax 40 Mg Tabs (Eletriptan hydrobromide) .Marland Kitchen.. 1 at beginniing of headache and may repeat once in 2 hours, max 80 mg in 24 hours prn 6)  Miralax Powd (Polyethylene glycol 3350) .Marland Kitchen.. 1 scoop once daily disp 1 month or 3 months 7)  Wellbutrin Xl 150 Mg Xr24h-tab (Bupropion hcl) .Marland Kitchen.. 1 by mouth qd 8)  Levothroid 137 Mcg Tabs (Levothyroxine sodium) .... Take one tablet once daily 9)  Valium 5 Mg Tabs (Diazepam) .... Take one tablet once daily and may take second tablet once daily on occasion 10)  Estrace 0.1 Mg/gm Crea (Estradiol) .Marland Kitchen.. 1 applicator full per vagina 3-4 x per week disp qs 3 m 11)  Tramadol Hcl 100 Mg Xr24h-tab (Tramadol hcl) .Marland Kitchen.. 1 tab two times a day 12)  Aleve 220 Mg Tabs (Naproxen sodium) .... As needed 13)  Flecainide Acetate 100 Mg Tabs (Flecainide acetate) .Marland Kitchen.. 1 tablet 2 times per day

## 2010-10-25 NOTE — Assessment & Plan Note (Signed)
Summary: ATTENDING HOSPITAL ADMISSION   History of Present Illness: worsening bronchospasm--feels like she cannot get any air in at all. Feels like she is going to "die".   Was seen yesterday for similar issues and we started her on abx (azithromycin and one dose ceftriaxone) as well as steroids (5 day burst 50 once daily), received 2 breathing tx in office yest and felt some better. Checked in w me via phone this am and was about 20% better. As afternoon moved on she became more short of breath and ultimately very panicky. Came o office where sat was 97% on RA, pulse was 110.     Past History:  Past Medical History: Last updated: 07/14/2007 ulcer as teenager atrial dysrhythmia followed by cards---on digoxin fibromyalgia well controlled on flexeril, trazodone and lexapro  Past Surgical History: Last updated: 03/03/2007 Cholecystectomy Hysterectomy  Social History: Last updated: 07/14/2007 denies tobaccor, illegal drugs. Drinks  socially only. Exercises daily most of the time works as Engineer, civil (consulting) at [employer]  Review of Systems       The patient complains of dyspnea on exertion and headaches.  The patient denies anorexia, weight loss, weight gain, chest pain, syncope, peripheral edema, abdominal pain, severe indigestion/heartburn, muscle weakness, suspicious skin lesions, difficulty walking, and depression.    Physical Exam  General:  anxious, pale with rapid respiratory rate Eyes:  vision grossly intact, pupils equal, pupils round, and pupils reactive to light.   Neck:  supple, full ROM, no masses, no thyromegaly, and no JVD.   Lungs:  increased work of breathing, hyperventillating. coarse breath sounds but no crackles, no rhonchi and no wheeze. her repiratory effort improved significantly after 1 mg im morphine, a neb tx and 1 mg ativan. Heart:  initially tachy, now rrr, no murmur Abdomen:  soft, non-tender, normal bowel sounds, and no distention.   Msk:  normal ROM.   Extremities:  no  edema Neurologic:  alert & oriented X3, strength normal in all extremities, and gait normal.   Skin:  no rash Psych:  Oriented X3 and memory intact for recent and remote.     Impression & Recommendations:  Problem # 1:  ACUTE BRONCHOSPASM (ICD-519.11) Assessment Deteriorated will admit for observation overnight, Oxygen support if needed. Have already given her low dose of morphine 1mg  and ativan 1 mg as well as neb here in office. Will monitor on telemtry and use nebs as needed. This may be vocal cord dysfuntion--possibly from recent viral infection followed by prolonged (3 week) post infectious tussive syndrome.  Complete Medication List: 1)  Allegra 180 Mg Tabs (Fexofenadine hcl) .... Take 1 tablet by mouth once a day 2)  Nexium 40 Mg Cpdr (Esomeprazole magnesium) .... Take 1 capsule once a day 3)  Lanoxin 0.25 Mg Tabs (Digoxin) .Marland Kitchen.. 1 by mouth once daily 4)  Celexa 40 Mg Tabs (Citalopram hydrobromide) .Marland Kitchen.. 1 by mouth qd 5)  Flexeril 10 Mg Tabs (Cyclobenzaprine hcl) .Marland Kitchen.. 1 by mouth two times a day or three times a day 6)  Trazodone Hcl 100 Mg Tabs (Trazodone hcl) .... 2 by mouth at bedtime 7)  Bayer Childrens Aspirin 81 Mg Chew (Aspirin) .... Once daily 8)  Relpax 40 Mg Tabs (Eletriptan hydrobromide) .Marland Kitchen.. 1 at beginniing of headache and may repeat once in 2 hours, max 80 mg in 24 hours 9)  Fosamax 70 Mg Tabs (Alendronate sodium) .Marland Kitchen.. 1 by mouth qweek 10)  Miralax Powd (Polyethylene glycol 3350) .Marland Kitchen.. 1 scoop once daily disp 1 month or 3 months  11)  Ventolin Hfa 108 (90 Base) Mcg/act Aers (Albuterol sulfate) .... 2 puffs three times a day as needed disp 1 mdi 12)  Azithromycin 250 Mg Tabs (Azithromycin) .Marland Kitchen.. 1 by mouth once daily for four days start june 2 13)  Prednisone 50 Mg Tabs (Prednisone) .Marland Kitchen.. 1 by mouth once daily for five days

## 2010-10-25 NOTE — Assessment & Plan Note (Signed)
Summary: cough x 1 month & HA   Vital Signs:  Patient profile:   60 year old female Menstrual status:  hysterectomy Height:      68 inches Weight:      181 pounds Temp:     98.7 degrees F oral Pulse rate:   81 / minute BP sitting:   110 / 73  Vitals Entered By: Alphia Kava (Feb 14, 2009 11:04 AM)  History of Present Illness: 1: occiital headacjhe on awakening about 3 days a week in last 3-4 weeks. Initially it was only a time or two and now more frequent. "worst headache I have ever had" type of HA--she has to take ibuprofen and sit still for about an  hour and then it improves, taking several hours to totally go away. Not similar in location or symptoms to her typical migraines but this is occasionally associated w nause--no emesis. Has also noted in last few weeks that she will have  episodes where she smelss a strog odor--like oranges, or pipe tobacco or spicy food--and there will be no source for that.   2: was tx for sinusitis/bronchitis a few weeks ago and felt initially some better. Now she is having a lot of non-productive coughing with an intermittent sore throat. Cough particularly triggered by perfumes, scented candles etc.Sore throat worse in am and then at bedtime. No fever sweats or chills  Habits & Providers     Alcohol drinks/day: <1     Alcohol Counseling: not indicated; use of alcohol is not excessive or problematic     Alcohol type: wine     Tobacco Status: quit > 6 months     Diet Counseling: not indicated; diet is assessed to be healthy     Does Patient Exercise: yes     Exercise (avg: min/session): 30-60     Times/week: 5     Have you felt down or hopeless? no     Have you felt little pleasure in things? no     Depression Counseling: not indicated; screening negative for depression     STD Risk: never     Drug Use: never     Seat Belt Use: always     Sun Exposure: rarely  Social History:    Smoking Status:  quit > 6 months    STD Risk:  never    Drug Use:   never    Risk analyst Use:  always    Sun Exposure-Excessive:  rarely  Review of Systems       The patient complains of hoarseness, prolonged cough, and headaches.  The patient denies anorexia, fever, weight loss, weight gain, chest pain, syncope, dyspnea on exertion, peripheral edema, abdominal pain, melena, severe indigestion/heartburn, muscle weakness, suspicious skin lesions, transient blindness, difficulty walking, depression, and abnormal bleeding.    Physical Exam  General:  alert.   Eyes:  vision grossly intact, pupils equal, pupils round, and pupils reactive to light.   Mouth:  good dentition and pharynx pink and moist.   Neck:  supple, full ROM, no masses, and no carotid bruits.   Lungs:  normal respiratory effort, normal breath sounds, no dullness, and no wheezes.   Heart:  normal rate, regular rhythm, and no murmur.   Neurologic:  alert & oriented X3, cranial nerves II-XII intact, strength normal in all extremities, and gait normal.     Impression & Recommendations:  Problem # 1:  OTHER COMPLICATED HEADACHE SYNDROME (ICD-339.44)  Orders: MRI (MRI) [facility]- Est  Level 4 (99214) odd symptom combination of new occipital headache that is severe coupled with the "smelss"--will get MRI. f/u after  Problem # 2:  ACUTE BRONCHOSPASM (ICD-519.11)  Orders: [facility]- Est  Level 4 ([address]) this could be some post infectious bronchospasm but vocal cord dysfuntion also a possibilityu. Will try albuterol MDI--also discussed vical cord 'rest"  rtc if not improving in next 2 weeks  Complete Medication List: 1)  Allegra 180 Mg Tabs (Fexofenadine hcl) .... Take 1 tablet by mouth once a day 2)  Nexium 40 Mg Cpdr (Esomeprazole magnesium) .... Take 1 capsule once a day 3)  Lanoxin 0.25 Mg Tabs (Digoxin) .Marland Kitchen.. 1 by mouth once daily 4)  Celexa 40 Mg Tabs (Citalopram hydrobromide) .Marland Kitchen.. 1 by mouth qd 5)  Flexeril 10 Mg Tabs (Cyclobenzaprine hcl) .Marland Kitchen.. 1 by mouth two times a day or three times a day 6)   Trazodone Hcl 100 Mg Tabs (Trazodone hcl) .... 2 by mouth at bedtime 7)  Bayer Childrens Aspirin 81 Mg Chew (Aspirin) .... Once daily 8)  Relpax 40 Mg Tabs (Eletriptan hydrobromide) .Marland Kitchen.. 1 at beginniing of headache and may repeat once in 2 hours, max 80 mg in 24 hours 9)  Fosamax 70 Mg Tabs (Alendronate sodium) .Marland Kitchen.. 1 by mouth qweek 10)  Miralax Powd (Polyethylene glycol 3350) .Marland Kitchen.. 1 scoop once daily disp 1 month or 3 months 11)  Ventolin Hfa 108 (90 Base) Mcg/act Aers (Albuterol sulfate) .... 2 puffs three times a day as needed disp 1 mdi Prescriptions: VENTOLIN HFA 108 (90 BASE) MCG/ACT AERS (ALBUTEROL SULFATE) 2 puffs three times a day as needed disp 1 mdi  #1 x 2   Entered and Authorized by:   Denny Levy MD   Signed by:   Denny Levy MD on 02/14/2009   Method used:   Electronically to        [facility]* (retail)       [address]       [address] Shipping/mailing       [place], [place]  [address]       Ph: [phone]       Fax: [phone]   RxID:   [id number]

## 2010-10-25 NOTE — Miscellaneous (Signed)
Summary: BRIEF OV  Medications Added ALLEGRA 180 MG TABS (FEXOFENADINE HCL) Take 1 tablet by mouth once a day NEXIUM 40 MG CPDR (ESOMEPRAZOLE MAGNESIUM) Take 1 capsule once a day NEURONTIN 300 MG  CAPS (GABAPENTIN) 1 by mouth at bedtime for 2 weeks then 1 by mouth two times a day       Clinical Lists Changes Rebecca Marshall saw me briefly for acute onset low back pain w radiation to buttock and burning sensation of her sole of her right foot. Started when she was brushing her teeth, leaned forward to spit and felt pop w acute onset pain. Will start her on gabapentin for her burning sole as I suspect she has irritated her nerve root. She has some left over pain meds at home. Rec she go home and take it easy rest of today and let me know how she is tommorrow. Medications: Added new medication of ALLEGRA 180 MG TABS (FEXOFENADINE HCL) Take 1 tablet by mouth once a day Added new medication of NEXIUM 40 MG CPDR (ESOMEPRAZOLE MAGNESIUM) Take 1 capsule once a day Added new medication of NEURONTIN 300 MG  CAPS (GABAPENTIN) 1 by mouth at bedtime for 2 weeks then 1 by mouth two times a day - Signed Rx of NEURONTIN 300 MG  CAPS (GABAPENTIN) 1 by mouth at bedtime for 2 weeks then 1 by mouth two times a day;  #60 x 2;  Signed;  Entered by: Denny Levy MD;  Authorized by: Denny Levy MD;  Method used: Historical Observations: Added new observation of LLIMPORTMEDS: completed (05/28/2007 10:44)    Prescriptions: NEURONTIN 300 MG  CAPS (GABAPENTIN) 1 by mouth at bedtime for 2 weeks then 1 by mouth two times a day  #60 x 2   Entered and Authorized by:   Denny Levy MD   Signed by:   Denny Levy MD on 05/28/2007   Method used:   Historical   RxID:   [id number]

## 2010-10-25 NOTE — Assessment & Plan Note (Signed)
Summary: RT SHOULDER PAIN,MC   Vital Signs:  Patient profile:   60 year old female Menstrual status:  hysterectomy BP sitting:   112 / 74  Primary Care Provider:  Jennette Kettle   History of Present Illness: RIGHT shoulder pain. had an injection about a year ago and has had no problem untillast few weeks. Notably she is going through reghab for her redo of her THR. Hip is doing extremenly well, much betterthan 1sttime.  Shoudler pain with alteral raise above shoulder level, any attempt to sleep on it and it is awakening her from sleep. No numbness or tinglin in her handor fingers, noa rm weakness. Pain is sharp and at times aching, centered in shoulder area.  Current Medications (verified): 1)  Nexium 40 Mg Cpdr (Esomeprazole Magnesium) .Marland Kitchen.. 1 Tab Two Times A Day 2)  Celexa 40 Mg  Tabs (Citalopram Hydrobromide) .Marland Kitchen.. 1 By Mouth Qd 3)  Flexeril 10 Mg  Tabs (Cyclobenzaprine Hcl) .... Take One Tablet At Bedtime 4)  Trazodone Hcl 100 Mg  Tabs (Trazodone Hcl) .... 2 By Mouth At Bedtime 5)  Relpax 40 Mg  Tabs (Eletriptan Hydrobromide) .Marland Kitchen.. 1 At Beginniing of Headache and May Repeat Once in 2 Hours, Max 80 Mg in 24 Hours Prn 6)  Miralax  Powd (Polyethylene Glycol 3350) .Marland Kitchen.. 1 Scoop Once Daily Disp 1 Month or 3 Months 7)  Wellbutrin Xl 150 Mg Xr24h-Tab (Bupropion Hcl) .Marland Kitchen.. 1 By Mouth Qd 8)  Levothroid 137 Mcg Tabs (Levothyroxine Sodium) .... Take One Tablet Once Daily 9)  Valium 10 Mg Tabs (Diazepam) .... Sig 1/2 -1 Tab By Mouth Once Daily or Two Times A Day As Needed Anxiety or Back Spasm 10)  Tramadol Hcl 50 Mg Tabs (Tramadol Hcl) .Marland Kitchen.. 1-2 Tabs  Q 4-6 Hrs As Needed Hip Pain With Max of 8 Tabs A Day. 11)  Flecainide Acetate 100 Mg Tabs (Flecainide Acetate) .Marland Kitchen.. 1 Tablet 2 Times Per Day  Allergies: 1)  * Lexiscan (Technetium )  Past History:  Past Surgical History: Cholecystectomy Hysterectomy Thyroidectomy--papillary carcimao Dr Lazarus Salines 04/2009--2 surgeries Left hip replacement with question  recall hip prosthesis: re don of THR 07/2010 Dr Luiz Blare. Breast biopsy Tubal ligation Bone spur right great toe  Review of Systems  The patient denies fever, weight loss, and weight gain.         Please see HPI for additional ROS.   Physical Exam  General:  alert, well-developed, well-nourished, and well-hydrated.   Msk:  RIGHT shoulder FROM, intact strength in allplanes rotator cuff. pain with supraspinatus testing and with IR. Additional Exam:  Patient given informed consent for injection. Discussed possible complications of infection, bleeding or skin atrophy at site of injection. Possible side effect of avascular necrosis (focal area of bone death) due to steroid use.Appropriate verbal time out taken Are cleaned and prepped in usual sterile fashion. A --1-- cc kennalog plus ---4-cc 1% lidocaine without epinephrine was injected into the-right subacromial bursa--. Patient tolerated procedure well with no complications.     Impression & Recommendations:  Problem # 1:  SHOULDER PAIN, RIGHT (ICD-719.41)  injection today gave her handout and theraband for [facility] HEP explained rtc prn  Orders: Joint Aspirate / Injection, Large (20610) Kenalog 10 mg inj (Z6109)  Problem # 2:  ANXIETY DEPRESSION (ICD-300.4) she is feeling so much better after her hip surgery she wants to try and taper off some of her antidepressants. We will decrease SSRI by half dose, continue wellbutrin at same dose and f/u 3-4 weeks.  Complete Medication List: 1)  Nexium 40 Mg Cpdr (Esomeprazole magnesium) .Marland Kitchen.. 1 tab two times a day 2)  Celexa 40 Mg Tabs (Citalopram hydrobromide) .... 1/2 by mouth once daily 3)  Flexeril 10 Mg Tabs (Cyclobenzaprine hcl) .... Take one tablet at bedtime 4)  Trazodone Hcl 100 Mg Tabs (Trazodone hcl) .... 2 by mouth at bedtime 5)  Relpax 40 Mg Tabs (Eletriptan hydrobromide) .Marland Kitchen.. 1 at beginniing of headache and may repeat once in 2 hours, max 80 mg in 24 hours prn 6)  Miralax Powd  (Polyethylene glycol 3350) .Marland Kitchen.. 1 scoop once daily disp 1 month or 3 months 7)  Wellbutrin Xl 150 Mg Xr24h-tab (Bupropion hcl) .Marland Kitchen.. 1 by mouth qd 8)  Levothroid 137 Mcg Tabs (Levothyroxine sodium) .... Take one tablet once daily 9)  Valium 10 Mg Tabs (Diazepam) .... Sig 1/2 -1 tab by mouth once daily or two times a day as needed anxiety or back spasm 10)  Tramadol Hcl 50 Mg Tabs (Tramadol hcl) .Marland Kitchen.. 1-2 tabs  q 4-6 hrs as needed hip pain with max of 8 tabs a day. 11)  Flecainide Acetate 100 Mg Tabs (Flecainide acetate) .Marland Kitchen.. 1 tablet 2 times per day   Orders Added: 1)  Est. Patient Level III [[address]] 2)  Joint Aspirate / Injection, Large [20610] 3)  Kenalog 10 mg inj [J3301]

## 2010-10-25 NOTE — Miscellaneous (Signed)
Summary: DIAGNOSTIC MAMMO/US  Clinical Lists Changes Pt had diagnostic mammo and [place] of R breat which showed cyst. Reverts back to original 1 y f/u of mammo for B breasts Observations: Added new observation of MAMMOGRAM: R breast diagnostic mammo and US--normal w breast cyst (11/09/2007 14:13)       Mammogram  Procedure date:  11/09/2007  Findings:      R breast diagnostic mammo and US--normal w breast cyst   Mammogram  Procedure date:  11/09/2007  Findings:      R breast diagnostic mammo and US--normal w breast cyst

## 2010-10-25 NOTE — Miscellaneous (Signed)
Summary: AP FILMS ORDER  Clinical Lists Changes  Problems: Added new problem of HIP PAIN, LEFT (ICD-719.45) Orders: Added new Test order of Radiology other (Radiology Other) - Signed        Complete Medication List: 1)  Allegra 180 Mg Tabs (Fexofenadine hcl) .... Take 1 tablet by mouth once a day 2)  Nexium 40 Mg Cpdr (Esomeprazole magnesium) .... Take 1 capsule once a day 3)  Lanoxin 0.25 Mg Tabs (Digoxin) .Marland Kitchen.. 1 by mouth once daily 4)  Lexapro 20 Mg Tabs (Escitalopram oxalate) .Marland Kitchen.. 1 by mouth once daily 5)  Flexeril 10 Mg Tabs (Cyclobenzaprine hcl) .Marland Kitchen.. 1 by mouth at bedtime 6)  Trazodone Hcl 100 Mg Tabs (Trazodone hcl) .... 2 by mouth at bedtime 7)  Bayer Childrens Aspirin 81 Mg Chew (Aspirin) .... Once daily   Appended Document: AP FILMS ORDER    Complete Medication List: 1)  Allegra 180 Mg Tabs (Fexofenadine hcl) .... Take 1 tablet by mouth once a day 2)  Nexium 40 Mg Cpdr (Esomeprazole magnesium) .... Take 1 capsule once a day 3)  Lanoxin 0.25 Mg Tabs (Digoxin) .Marland Kitchen.. 1 by mouth once daily 4)  Lexapro 20 Mg Tabs (Escitalopram oxalate) .Marland Kitchen.. 1 by mouth once daily 5)  Flexeril 10 Mg Tabs (Cyclobenzaprine hcl) .Marland Kitchen.. 1 by mouth at bedtime 6)  Trazodone Hcl 100 Mg Tabs (Trazodone hcl) .... 2 by mouth at bedtime 7)  Bayer Childrens Aspirin 81 Mg Chew (Aspirin) .... Once daily

## 2010-10-25 NOTE — Miscellaneous (Signed)
Summary: Orders Update  Clinical Lists Changes  Orders: Added new Test order of CT (CT) - Signed

## 2010-10-25 NOTE — Miscellaneous (Signed)
  Clinical Lists Changes  Medications: Changed medication from [facility] ER 100 MG  TB24 (TRAMADOL HCL) 1/2 -1 by mouth q 8 hrs as needed pain to ULTRAM 50 MG  TABS (TRAMADOL HCL) 1-2 by mouth three times a day as needed pain - Signed Rx of ULTRAM 50 MG  TABS (TRAMADOL HCL) 1-2 by mouth three times a day as needed pain;  #540 x 1;  Signed;  Entered by: Denny Levy MD;  Authorized by: Denny Levy MD;  Method used: Telephoned to [facility]*, [address] Shipping/mailing, [place], [place]  [address], Ph: [phone], Fax: [phone]    Prescriptions: ULTRAM 50 MG  TABS (TRAMADOL HCL) 1-2 by mouth three times a day as needed pain  #540 x 1   Entered and Authorized by:   Denny Levy MD   Signed by:   Denny Levy MD on 03/17/2008   Method used:   Telephoned to ...       [facility]*       [address]       [address] Shipping/mailing       [place], [place]  [address]       Ph: [phone]       Fax: [phone]   RxID:   [id number]

## 2010-10-25 NOTE — Miscellaneous (Signed)
  Clinical Lists Changes  Medications: Added new medication of MIRALAX  POWD (POLYETHYLENE GLYCOL 3350) 1 scoop once daily disp 1 month or 3 months - Signed Rx of MIRALAX  POWD (POLYETHYLENE GLYCOL 3350) 1 scoop once daily disp 1 month or 3 months;  #1 x 12;  Signed;  Entered by: Denny Levy MD;  Authorized by: Denny Levy MD;  Method used: Electronically to [facility]*, [address] Shipping/mailing, [place], [place]  [address], Ph: [phone], Fax: [phone]    Prescriptions: MIRALAX  POWD (POLYETHYLENE GLYCOL 3350) 1 scoop once daily disp 1 month or 3 months  #1 x 12   Entered and Authorized by:   Denny Levy MD   Signed by:   Denny Levy MD on 06/22/2008   Method used:   Electronically to        [facility]* (retail)       [address]       [address] Shipping/mailing       [place], [place]  [address]       Ph: [phone]       Fax: [phone]   RxID:   [id number]

## 2010-10-31 NOTE — Miscellaneous (Signed)
Summary: ROI  ROI   Imported By: De Nurse 10/23/2010 19:18:35  _____________________________________________________________________  External Attachment:    Type:   Image     Comment:   External Document

## 2010-11-02 ENCOUNTER — Telehealth (INDEPENDENT_AMBULATORY_CARE_PROVIDER_SITE_OTHER): Payer: Self-pay | Admitting: *Deleted

## 2010-11-06 ENCOUNTER — Telehealth (INDEPENDENT_AMBULATORY_CARE_PROVIDER_SITE_OTHER): Payer: Self-pay | Admitting: *Deleted

## 2010-11-08 NOTE — Progress Notes (Signed)
  Request received from [facility] sent to [facility]  November 02, 2010 11:52 AM

## 2010-11-14 NOTE — Progress Notes (Signed)
  Phone Note Other Incoming   Request: Send information Summary of Call: Request for records received from [facility]Request forwarded to [facility].  Records/data for the past 10 yrs.Verified-No imaging data available.

## 2010-11-28 ENCOUNTER — Other Ambulatory Visit: Payer: Self-pay | Admitting: Family Medicine

## 2010-11-28 MED ORDER — LIDOCAINE 5 % EX PTCH: 1.0000 | MEDICATED_PATCH | Freq: Two times a day (BID) | CUTANEOUS | Status: DC

## 2010-11-30 ENCOUNTER — Other Ambulatory Visit: Payer: Self-pay | Admitting: Family Medicine

## 2010-11-30 MED ORDER — LIDOCAINE 5 % EX PTCH: 1.0000 | MEDICATED_PATCH | Freq: Two times a day (BID) | CUTANEOUS | Status: DC

## 2010-11-30 NOTE — Telephone Encounter (Signed)
Repeat Rx sent because recent one not received

## 2010-12-05 LAB — BASIC METABOLIC PANEL
BUN: 4 mg/dL — ABNORMAL LOW (ref 6–23)
BUN: 4 mg/dL — ABNORMAL LOW (ref 6–23)
BUN: 7 mg/dL (ref 6–23)
BUN: 7 mg/dL (ref 6–23)
CO2: 28 mEq/L (ref 19–32)
CO2: 29 mEq/L (ref 19–32)
CO2: 31 mEq/L (ref 19–32)
CO2: 31 mEq/L (ref 19–32)
Calcium: 7.4 mg/dL — ABNORMAL LOW (ref 8.4–10.5)
Calcium: 7.8 mg/dL — ABNORMAL LOW (ref 8.4–10.5)
Calcium: 7.9 mg/dL — ABNORMAL LOW (ref 8.4–10.5)
Calcium: 7.9 mg/dL — ABNORMAL LOW (ref 8.4–10.5)
Chloride: 102 mEq/L (ref 96–112)
Chloride: 106 mEq/L (ref 96–112)
Chloride: 106 mEq/L (ref 96–112)
Chloride: 106 mEq/L (ref 96–112)
Creatinine, Ser: 0.62 mg/dL (ref 0.4–1.2)
Creatinine, Ser: 0.65 mg/dL (ref 0.4–1.2)
Creatinine, Ser: 0.72 mg/dL (ref 0.4–1.2)
Creatinine, Ser: 0.75 mg/dL (ref 0.4–1.2)
GFR calc Af Amer: >60 mL/min (ref >60)
GFR calc Af Amer: >60 mL/min (ref >60)
GFR calc Af Amer: >60 mL/min (ref >60)
GFR calc Af Amer: >60 mL/min (ref >60)
GFR calc non Af Amer: >60 mL/min (ref >60)
GFR calc non Af Amer: >60 mL/min (ref >60)
GFR calc non Af Amer: >60 mL/min (ref >60)
GFR calc non Af Amer: >60 mL/min (ref >60)
Glucose, Bld: 107 mg/dL — ABNORMAL HIGH (ref 70–99)
Glucose, Bld: 119 mg/dL — ABNORMAL HIGH (ref 70–99)
Glucose, Bld: 95 mg/dL (ref 70–99)
Glucose, Bld: 98 mg/dL (ref 70–99)
Potassium: 3.3 mEq/L — ABNORMAL LOW (ref 3.5–5.1)
Potassium: 3.6 mEq/L (ref 3.5–5.1)
Potassium: 3.8 mEq/L (ref 3.5–5.1)
Potassium: 4.5 mEq/L (ref 3.5–5.1)
Sodium: 137 mEq/L (ref 135–145)
Sodium: 139 mEq/L (ref 135–145)
Sodium: 141 mEq/L (ref 135–145)
Sodium: 143 mEq/L (ref 135–145)

## 2010-12-05 LAB — TYPE AND SCREEN
ABO/RH(D): O POS
ABO/RH(D): O POS
Antibody Screen: POSITIVE
Antibody Screen: POSITIVE
DAT, IgG: NEGATIVE
Donor AG Type: NEGATIVE
Donor AG Type: NEGATIVE
Unit division: 0
Unit division: 0

## 2010-12-05 LAB — DIFFERENTIAL
Basophils Absolute: 0 10*3/uL (ref 0.0–0.1)
Basophils Absolute: 0 10*3/uL (ref 0.0–0.1)
Basophils Relative: 1 % (ref 0–1)
Basophils Relative: 1 % (ref 0–1)
Eosinophils Absolute: 0 10*3/uL (ref 0.0–0.7)
Eosinophils Absolute: 0.1 10*3/uL (ref 0.0–0.7)
Eosinophils Relative: 1 % (ref 0–5)
Eosinophils Relative: 2 % (ref 0–5)
Lymphocytes Relative: 21 % (ref 12–46)
Lymphocytes Relative: 30 % (ref 12–46)
Lymphs Abs: 1.1 10*3/uL (ref 0.7–4.0)
Lymphs Abs: 1.3 10*3/uL (ref 0.7–4.0)
Monocytes Absolute: 0.5 10*3/uL (ref 0.1–1.0)
Monocytes Absolute: 0.7 10*3/uL (ref 0.1–1.0)
Monocytes Relative: 12 % (ref 3–12)
Monocytes Relative: 13 % — ABNORMAL HIGH (ref 3–12)
Neutro Abs: 2.4 10*3/uL (ref 1.7–7.7)
Neutro Abs: 3.5 10*3/uL (ref 1.7–7.7)
Neutrophils Relative %: 55 % (ref 43–77)
Neutrophils Relative %: 65 % (ref 43–77)

## 2010-12-05 LAB — URINE MICROSCOPIC-ADD ON

## 2010-12-05 LAB — ANAEROBIC CULTURE: Gram Stain: NONE SEEN

## 2010-12-05 LAB — CBC
HCT: 25.5 % — ABNORMAL LOW (ref 36.0–46.0)
HCT: 27.5 % — ABNORMAL LOW (ref 36.0–46.0)
HCT: 29.3 % — ABNORMAL LOW (ref 36.0–46.0)
HCT: 35.5 % — ABNORMAL LOW (ref 36.0–46.0)
Hemoglobin: 11.8 g/dL — ABNORMAL LOW (ref 12.0–15.0)
Hemoglobin: 8.2 g/dL — ABNORMAL LOW (ref 12.0–15.0)
Hemoglobin: 9.1 g/dL — ABNORMAL LOW (ref 12.0–15.0)
Hemoglobin: 9.7 g/dL — ABNORMAL LOW (ref 12.0–15.0)
MCH: 29.7 pg (ref 26.0–34.0)
MCH: 29.9 pg (ref 26.0–34.0)
MCH: 30.5 pg (ref 26.0–34.0)
MCH: 30.8 pg (ref 26.0–34.0)
MCHC: 32.2 g/dL (ref 30.0–36.0)
MCHC: 33.1 g/dL (ref 30.0–36.0)
MCHC: 33.1 g/dL (ref 30.0–36.0)
MCHC: 33.2 g/dL (ref 30.0–36.0)
MCV: 90.5 fL (ref 78.0–100.0)
MCV: 92.1 fL (ref 78.0–100.0)
MCV: 92.4 fL (ref 78.0–100.0)
MCV: 92.7 fL (ref 78.0–100.0)
Platelets: 163 10*3/uL (ref 150–400)
Platelets: 168 10*3/uL (ref 150–400)
Platelets: 178 10*3/uL (ref 150–400)
Platelets: 234 10*3/uL (ref 150–400)
RBC: 2.76 MIL/uL — ABNORMAL LOW (ref 3.87–5.11)
RBC: 3.04 MIL/uL — ABNORMAL LOW (ref 3.87–5.11)
RBC: 3.18 MIL/uL — ABNORMAL LOW (ref 3.87–5.11)
RBC: 3.83 MIL/uL — ABNORMAL LOW (ref 3.87–5.11)
RDW: 14.4 % (ref 11.5–15.5)
RDW: 14.5 % (ref 11.5–15.5)
RDW: 14.5 % (ref 11.5–15.5)
RDW: 14.8 % (ref 11.5–15.5)
WBC: 4.4 10*3/uL (ref 4.0–10.5)
WBC: 5.4 10*3/uL (ref 4.0–10.5)
WBC: 5.4 10*3/uL (ref 4.0–10.5)
WBC: 5.6 10*3/uL (ref 4.0–10.5)

## 2010-12-05 LAB — COMPREHENSIVE METABOLIC PANEL
ALT: 21 U/L (ref 0–35)
AST: 27 U/L (ref 0–37)
Albumin: 3.9 g/dL (ref 3.5–5.2)
Alkaline Phosphatase: 56 U/L (ref 39–117)
BUN: 21 mg/dL (ref 6–23)
CO2: 32 mEq/L (ref 19–32)
Calcium: 8.6 mg/dL (ref 8.4–10.5)
Chloride: 103 mEq/L (ref 96–112)
Creatinine, Ser: 0.79 mg/dL (ref 0.4–1.2)
GFR calc Af Amer: >60 mL/min (ref >60)
GFR calc non Af Amer: >60 mL/min (ref >60)
Glucose, Bld: 116 mg/dL — ABNORMAL HIGH (ref 70–99)
Potassium: 4.2 mEq/L (ref 3.5–5.1)
Sodium: 139 mEq/L (ref 135–145)
Total Bilirubin: 0.7 mg/dL (ref 0.3–1.2)
Total Protein: 6.5 g/dL (ref 6.0–8.3)

## 2010-12-05 LAB — URINALYSIS, ROUTINE W REFLEX MICROSCOPIC
Bilirubin Urine: NEGATIVE
Glucose, UA: NEGATIVE mg/dL
Ketones, ur: NEGATIVE mg/dL
Leukocytes, UA: NEGATIVE
Nitrite: NEGATIVE
Protein, ur: NEGATIVE mg/dL
Specific Gravity, Urine: 1.023 (ref 1.005–1.030)
Urobilinogen, UA: 0.2 mg/dL (ref 0.0–1.0)
pH: 6 (ref 5.0–8.0)

## 2010-12-05 LAB — SURGICAL PCR SCREEN
MRSA, PCR: NEGATIVE
Staphylococcus aureus: NEGATIVE

## 2010-12-05 LAB — PROTIME-INR
INR: 1.04 (ref 0.00–1.49)
INR: 1.16 (ref 0.00–1.49)
INR: 1.3 (ref 0.00–1.49)
INR: 1.74 — ABNORMAL HIGH (ref 0.00–1.49)
INR: 1.93 — ABNORMAL HIGH (ref 0.00–1.49)
Prothrombin Time: 13.8 seconds (ref 11.6–15.2)
Prothrombin Time: 15 seconds (ref 11.6–15.2)
Prothrombin Time: 16.4 seconds — ABNORMAL HIGH (ref 11.6–15.2)
Prothrombin Time: 20.5 seconds — ABNORMAL HIGH (ref 11.6–15.2)
Prothrombin Time: 22.2 seconds — ABNORMAL HIGH (ref 11.6–15.2)

## 2010-12-05 LAB — BODY FLUID CULTURE
Culture: NO GROWTH
Gram Stain: NONE SEEN

## 2010-12-05 LAB — HEMOGLOBIN AND HEMATOCRIT, BLOOD
HCT: 30 % — ABNORMAL LOW (ref 36.0–46.0)
Hemoglobin: 10 g/dL — ABNORMAL LOW (ref 12.0–15.0)

## 2010-12-05 LAB — APTT: aPTT: 28 seconds (ref 24–37)

## 2010-12-11 ENCOUNTER — Encounter: Payer: Self-pay | Admitting: Family Medicine

## 2010-12-18 ENCOUNTER — Encounter: Payer: Self-pay | Admitting: Sports Medicine

## 2010-12-18 ENCOUNTER — Ambulatory Visit (INDEPENDENT_AMBULATORY_CARE_PROVIDER_SITE_OTHER): Payer: Commercial Managed Care - PPO | Admitting: Sports Medicine

## 2010-12-18 VITALS — BP 110/70 | Ht 68.0 in | Wt 190.0 lb

## 2010-12-18 DIAGNOSIS — M21611 Bunion of right foot: Secondary | ICD-10-CM | POA: Insufficient documentation

## 2010-12-18 DIAGNOSIS — M7740 Metatarsalgia, unspecified foot: Secondary | ICD-10-CM

## 2010-12-18 DIAGNOSIS — M21619 Bunion of unspecified foot: Secondary | ICD-10-CM

## 2010-12-18 DIAGNOSIS — R269 Unspecified abnormalities of gait and mobility: Secondary | ICD-10-CM

## 2010-12-18 DIAGNOSIS — M7750 Other enthesopathy of unspecified foot: Secondary | ICD-10-CM

## 2010-12-18 HISTORY — DX: Bunion of right foot: M21.611

## 2010-12-18 HISTORY — DX: Metatarsalgia, unspecified foot: M77.40

## 2010-12-18 HISTORY — DX: Unspecified abnormalities of gait and mobility: R26.9

## 2010-12-18 NOTE — Assessment & Plan Note (Addendum)
Patient was fitted for a standard, cushioned, semi-rigid orthotic.  The orthotic was heated and the patient stood on the orthotic blank positioned on the orthotic stand. The patient was positioned in subtalar neutral position and 10 degrees of ankle dorsiflexion in a weight bearing stance. After molding, a stable Fast-Tech EVA base was applied to the orthotic blank.   The blank was ground to a stable position for weight bearing. Size: 10 blue swirl base: white EVA posting: first MTP pad RT additional orthotic padding: MT  Time 35 mins  Use orthotics for all activity

## 2010-12-18 NOTE — Progress Notes (Signed)
  Subjective:    Patient ID: Rebecca Marshall, female    DOB: 1951-03-01, 60 y.o.   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 This pain is centered over the right great toe. But she also gets pain in both feet toward the forefoot if she does not have her orthotics in place. These were originally made for her 5 years ago. Since that time she has had 2 hip replacements. She had the metal Depuy  Hip N. Had a serious reaction to that on the first surgery. Before the second surgery she had a significant leg length difference and some scoliosis. Some correction for the leg length differences was made at surgery.   Review of Systems     Objective:   Physical Exam     Patient demonstrates flattening of the forefoot bilaterally. There is essentially complete loss of the transverse arch. There are bunions bilaterally. The great toe crosses  Under the second toes bilaterally there is some degenerative change at the first MTP on the right. Motion is limited bilaterally. She still gets about 30 of extension.   Gait reveals that she walks with a slight shift to the right but no true Trendelenburg today   Lying leg lengths are not equal. Standing she has slight scoliosis to the right which shiftsher trunk to the right of midline.    Assessment & Plan:

## 2010-12-18 NOTE — Assessment & Plan Note (Signed)
Try a supination shift in orthotic to lessen stress as well as floating 1st mtp RT

## 2010-12-18 NOTE — Assessment & Plan Note (Signed)
Small MT pads added bilat  Recheck prn

## 2010-12-20 NOTE — Miscellaneous (Signed)
  Clinical Lists Changes  Observations: Added new observation of DM PROGRESS: N/A (12/11/2010 16:23) Added new observation of DM FSREVIEW: N/A (12/11/2010 16:23) Added new observation of HTN PROGRESS: N/A (12/11/2010 16:23) Added new observation of HTN FSREVIEW: N/A (12/11/2010 16:23) Added new observation of LIPID PROGRS: N/A (12/11/2010 16:23) Added new observation of LIPID FSREVW: N/A (12/11/2010 16:23)      Complete Medication List: 1)  Nexium 40 Mg Cpdr (Esomeprazole magnesium) .Marland Kitchen.. 1 tab two times a day 2)  Celexa 40 Mg Tabs (Citalopram hydrobromide) .Marland Kitchen.. 1 by mouth qd 3)  Flexeril 10 Mg Tabs (Cyclobenzaprine hcl) .... Take one tablet at bedtime 4)  Trazodone Hcl 100 Mg Tabs (Trazodone hcl) .... 2 by mouth at bedtime 5)  Relpax 40 Mg Tabs (Eletriptan hydrobromide) .Marland Kitchen.. 1 at beginniing of headache and may repeat once in 2 hours, max 80 mg in 24 hours prn 6)  Miralax Powd (Polyethylene glycol 3350) .Marland Kitchen.. 1 scoop once daily disp 1 month or 3 months 7)  Wellbutrin Xl 150 Mg Xr24h-tab (Bupropion hcl) .Marland Kitchen.. 1 by mouth qd 8)  Levothroid 137 Mcg Tabs (Levothyroxine sodium) .... Take one tablet once daily 9)  Valium 10 Mg Tabs (Diazepam) .... Sig 1/2 -1 tab by mouth once daily or two times a day as needed anxiety or back spasm 10)  Tramadol Hcl 50 Mg Tabs (Tramadol hcl) .Marland Kitchen.. 1-2 tabs  q 4-6 hrs as needed hip pain with max of 8 tabs a day. 11)  Flecainide Acetate 100 Mg Tabs (Flecainide acetate) .Marland Kitchen.. 1 tablet 2 times per day 12)  Avelox 400 Mg Tabs (Moxifloxacin hcl) .Marland Kitchen.. 1 by mouth once daily for 7 days   Past History:  Past Medical History: Last updated: 10/04/2009 ulcer as teenager atrial dysrhythmia followed by cards---on digoxin fibromyalgia well controlled on flexeril, trazodone and lexapro Hip Replacement Thyroid Cancer status post  resection on replacement Dr Talmage Nap Anxiety/depression GE reflux disease Arthritis  Past Surgical History: Last updated:  09/03/2010 Cholecystectomy Hysterectomy Thyroidectomy--papillary carcimao Dr Lazarus Salines 04/2009--2 surgeries Left hip replacement with question recall hip prosthesis: re don of THR 07/2010 Dr Luiz Blare. Breast biopsy Tubal ligation Bone spur right great toe  Family History: Last updated: 10/04/2009 Family History of Cancer:   Social History: Last updated: 09/20/2009 No  tobacco, illegal drugs. Drinks socially only. Exercises daily most of the time works as Engineer, civil (consulting) at [employer]    Prevention & Chronic Care Immunizations   Influenza vaccine: Not documented    Tetanus booster: Not documented    Pneumococcal vaccine: Not documented  Colorectal Screening   Hemoccult: Not documented    Colonoscopy: Not documented  Other Screening   Pap smear: Not documented    Mammogram: ASSESSMENT: Negative - BI-RADS 1^MM DIGITAL SCREENING  (08/28/2010)   Smoking status: never  (09/20/2009)  Lipids   Total Cholesterol: Not documented   LDL: Not documented   LDL Direct: 121  (07/04/2010)   HDL: Not documented   Triglycerides: Not documented

## 2010-12-29 LAB — CBC
HCT: 36.4 % (ref 36.0–46.0)
Hemoglobin: 12.2 g/dL (ref 12.0–15.0)
MCHC: 33.6 g/dL (ref 30.0–36.0)
MCV: 86.7 fL (ref 78.0–100.0)
Platelets: 276 10*3/uL (ref 150–400)
RBC: 4.2 MIL/uL (ref 3.87–5.11)
RDW: 14.6 % (ref 11.5–15.5)
WBC: 6.4 10*3/uL (ref 4.0–10.5)

## 2010-12-29 LAB — COMPREHENSIVE METABOLIC PANEL
ALT: 26 U/L (ref 0–35)
AST: 28 U/L (ref 0–37)
Albumin: 3.8 g/dL (ref 3.5–5.2)
Alkaline Phosphatase: 60 U/L (ref 39–117)
BUN: 10 mg/dL (ref 6–23)
CO2: 28 mEq/L (ref 19–32)
Calcium: 8.9 mg/dL (ref 8.4–10.5)
Chloride: 106 mEq/L (ref 96–112)
Creatinine, Ser: 0.67 mg/dL (ref 0.4–1.2)
GFR calc Af Amer: >60 mL/min (ref >60)
GFR calc non Af Amer: >60 mL/min (ref >60)
Glucose, Bld: 105 mg/dL — ABNORMAL HIGH (ref 70–99)
Potassium: 4.1 mEq/L (ref 3.5–5.1)
Sodium: 140 mEq/L (ref 135–145)
Total Bilirubin: 0.6 mg/dL (ref 0.3–1.2)
Total Protein: 6.6 g/dL (ref 6.0–8.3)

## 2010-12-29 LAB — CALCIUM
Calcium: 8 mg/dL — ABNORMAL LOW (ref 8.4–10.5)
Calcium: 8.1 mg/dL — ABNORMAL LOW (ref 8.4–10.5)
Calcium: 8.7 mg/dL (ref 8.4–10.5)

## 2010-12-30 LAB — COMPREHENSIVE METABOLIC PANEL
ALT: 19 U/L (ref 0–35)
AST: 26 U/L (ref 0–37)
Albumin: 3.7 g/dL (ref 3.5–5.2)
Alkaline Phosphatase: 58 U/L (ref 39–117)
BUN: 10 mg/dL (ref 6–23)
CO2: 28 mEq/L (ref 19–32)
Calcium: 8.9 mg/dL (ref 8.4–10.5)
Chloride: 104 mEq/L (ref 96–112)
Creatinine, Ser: 0.63 mg/dL (ref 0.4–1.2)
GFR calc Af Amer: >60 mL/min (ref >60)
GFR calc non Af Amer: >60 mL/min (ref >60)
Glucose, Bld: 98 mg/dL (ref 70–99)
Potassium: 4.5 mEq/L (ref 3.5–5.1)
Sodium: 137 mEq/L (ref 135–145)
Total Bilirubin: 0.7 mg/dL (ref 0.3–1.2)
Total Protein: 6.4 g/dL (ref 6.0–8.3)

## 2010-12-30 LAB — CBC
HCT: 37 % (ref 36.0–46.0)
Hemoglobin: 12.6 g/dL (ref 12.0–15.0)
MCHC: 34.2 g/dL (ref 30.0–36.0)
MCV: 87.6 fL (ref 78.0–100.0)
Platelets: 232 10*3/uL (ref 150–400)
RBC: 4.23 MIL/uL (ref 3.87–5.11)
RDW: 15.7 % — ABNORMAL HIGH (ref 11.5–15.5)
WBC: 5.2 10*3/uL (ref 4.0–10.5)

## 2011-01-16 ENCOUNTER — Other Ambulatory Visit (HOSPITAL_COMMUNITY): Payer: Self-pay | Admitting: Endocrinology

## 2011-01-16 DIAGNOSIS — C73 Malignant neoplasm of thyroid gland: Secondary | ICD-10-CM

## 2011-02-01 ENCOUNTER — Other Ambulatory Visit: Payer: Self-pay | Admitting: Family Medicine

## 2011-02-01 ENCOUNTER — Encounter: Payer: Self-pay | Admitting: Family Medicine

## 2011-02-01 DIAGNOSIS — M25559 Pain in unspecified hip: Secondary | ICD-10-CM

## 2011-02-04 ENCOUNTER — Encounter: Payer: Self-pay | Admitting: Family Medicine

## 2011-02-04 ENCOUNTER — Encounter (HOSPITAL_COMMUNITY)
Admission: RE | Admit: 2011-02-04 | Discharge: 2011-02-04 | Disposition: A | Discharge status: Home / Self Care | Payer: 59 | Source: Ambulatory Visit | Attending: Endocrinology | Admitting: Endocrinology

## 2011-02-04 DIAGNOSIS — Z09 Encounter for follow-up examination after completed treatment for conditions other than malignant neoplasm: Secondary | ICD-10-CM | POA: Insufficient documentation

## 2011-02-04 DIAGNOSIS — C73 Malignant neoplasm of thyroid gland: Secondary | ICD-10-CM | POA: Insufficient documentation

## 2011-02-05 ENCOUNTER — Encounter (HOSPITAL_COMMUNITY)
Admission: RE | Admit: 2011-02-05 | Discharge: 2011-02-05 | Disposition: A | Discharge status: Home / Self Care | Payer: 59 | Source: Ambulatory Visit | Attending: Endocrinology | Admitting: Endocrinology

## 2011-02-05 NOTE — Discharge Summary (Signed)
[name], [name]              ACCOUNT NO.:  [id number]   MEDICAL RECORD NO.:  [id number]          PATIENT TYPE:  INP   LOCATION:  5005                         FACILITY:  [facility]   PHYSICIAN:  Harvie Junior, M.D.   DATE OF BIRTH:  Sep 08, 1951   DATE OF ADMISSION:  06/27/2008  DATE OF DISCHARGE:  06/30/2008                               DISCHARGE SUMMARY   ADMITTING DIAGNOSES:  1. End-stage degenerative joint disease, left hip.  2. Chronic fibromyalgia.  3. Chronic low back pain.  4. Depression.  5. Gastroesophageal reflux disease.   DISCHARGE DIAGNOSES:  1. End-stage degenerative joint disease, left hip.  2. Chronic fibromyalgia.  3. Chronic low back pain.  4. Depression.  5. Gastroesophageal reflux disease.  6. Acute blood loss anemia.   PROCEDURES IN THE HOSPITAL:  Left total hip arthroplasty, Jodi Geralds,  MD, June 27, 2008.   CONSULTATIONS IN HOSPITAL:  None.   BRIEF HISTORY:  Rebecca Marshall is a 60 year old female who has long standing  complaints of left hip pain and limited motion.  She had significant  pain and left hip to the point where she required significant narcotic  usage for management and modification of her activity.  She works as a  Engineer, civil (consulting) at the [employer] and has a significant  difficulty over time doing this job.  She got no relief with  conservative treatment including use of medication and modification of  activity.  Based upon her radiographic findings which showed that she  had bone-on-bone DJD of the left hip and the clinical findings, she is  felt to be a candidate for a left total hip arthroplasty.  She is  admitted for this.   PERTINENT LABORATORY STUDIES:  Her EKG on June 22, 2008, showed  normal sinus rhythm, normal EKG.  Postoperative x-ray of the pelvis and  left hip showed expected appearance after left total hip arthroplasty.  Hemoglobin on admission was 12.5 with hematocrit of 36.6, WBC 4.2,  indices within normal  limits.  On postop day #1, hemoglobin was 9.4.  Postop day #2, 9.4.  Postop day #3 9.2.  Pro time on admission was 13.5  seconds with an INR of 1.0 and a PTT of 25.  On the day of discharge on  Coumadin antithrombotic therapy, her pro time was 29 seconds with an INR  of 2.5.  Her CMET on admission showed no abnormalities other than very  slightly elevated glucose of 100.  On postop day #1, her BMET was within  normal limits.  Urinalysis on admission showed a small amount of  hemoglobin with rare bacteria and 0-2 wbc's per high-powered field.  As  of note that 1 unit of autologous blood was transfused on June 28, 2008.   HOSPITAL COURSE:  Rebecca Marshall was brought to the operating room after she had  been given 2 g of Ancef and 80 mg IV of gentamicin prophylactically.  She was brought to the operating room where she underwent left total hip  arthroplasty as well described in Dr. Luiz Blare' operative note.  Postoperatively, she was given 1 g  of Ancef q.8 h. x3 doses, started on  Coumadin antithrombotic therapy per pharmacy protocol and was given a  PCA morphine pump with only IV fluids.  Physical Therapy was ordered for  walker ambulation.  Weightbearing as tolerated on the left using hip  precautions.  Postop day #1, she complained of left hip pain.  She is  taking fluids without difficulty.  Her Foley catheter was in place, at  the time of surgery was intact.  Her O2 sats were 100% on 2 liters of  oxygen.  Her postoperative x-rays showed good position of her  prosthesis.  Her hemoglobin was 9.4 and her BMET was within normal  limits.  Her INR was 1.1 and 1 unit of autologous blood which was given  preoperatively was transfused as we felt this would increase her rehab  potential.  We felt that her hemoglobin would probably drift down if she  is not transfused.  She was continued on a PCA morphine pump, IV fluids,  and physical therapy got her out of the bed into the chair.  We used  Dilaudid  orally for pain as well.  Her Foley catheter was in place at  the time of surgery.  This was removed after her transfusion.  She made  good progress with physical therapy and did have significant pain.  On  postop day #2, her vital signs were stable.  She is afebrile.  Her  hemoglobin was 9.4.  Her BUN was stable.  INR was 1.8.  We did not feel  she was safe to be home at this point, but needed additional day  physical therapy.  PCA pump was discontinued and on postop day #3, she  was without complaints other than mild to moderate left hip pain.  She  is taking fluids and voiding without difficulty.  She had a fever of  100.1.  Her lungs were clear.  Her vital signs were stable.  Her left  hip dressing was changed.  Her wound was benign.  She had normal  neurovascular status of the  left lower extremity and a soft calf.  Her  hemoglobin was 9.2.  Her INR was 2.5.  She was discharged home in  improved condition on a regular diet.  She was given Rx for Dilaudid 2-  mg tablets one to two q.6 h. p.r.n. pain, Robaxin 750 mg one q.8 h.  p.r.n. spasm, and Coumadin for DVT prophylaxis x1 month postop.  She  will be weightbearing as tolerated on the left with a walker using hip  precautions.  Home health physical therapy and home health RN for pro  times and Coumadin management.  We will keep on Coumadin again for 1  month postop.  She will follow up with Dr. Luiz Blare in the office in 10-14  days or sooner if any problems occur.      Marshia Ly, P.A.      Harvie Junior, M.D.  Electronically Signed    JB/MEDQ  D:  09/28/2008  T:  09/29/2008  Job:  [id number]   cc:   [name], MD

## 2011-02-05 NOTE — Op Note (Signed)
Rebecca Marshall, Rebecca Marshall              ACCOUNT NO.:  [id number]   MEDICAL RECORD NO.:  [id number]          PATIENT TYPE:  OIB   LOCATION:  5123                         FACILITY:  [facility]   PHYSICIAN:  Zola Button T. Lazarus Salines, M.D. DATE OF BIRTH:  11/08/1950   DATE OF PROCEDURE:  04/26/2009  DATE OF DISCHARGE:  04/26/2009                               OPERATIVE REPORT   PREOPERATIVE DIAGNOSIS:  Left thyroid mass.   POSTOPERATIVE DIAGNOSIS:  Left thyroid follicular lesion.   PROCEDURE PERFORMED:  Left thyroid lobectomy.   SURGEON:  Gloris Manchester. Lazarus Salines, MD   ASSISTANT:  Suzanna Obey, MD   ANESTHESIA:  General orotracheal.   BLOOD LOSS:  Minimal.   COMPLICATIONS:  None.   FINDINGS:  A soft 4-cm nodule coming off the lower lobe of the left  thyroid gland.  Generalized nodularity of both thyroid lobes.  No  identified lymphadenopathy.  By frozen section, a follicular lesion of  the left thyroid with lymphocytic thyroiditis-type changes.   PROCEDURE:  With the patient in a comfortable supine position, general  orotracheal anesthesia was induced without difficulty.  At an  appropriate level, the patient was placed in a slight reverse  Trendelenburg.  A shoulder roll was placed and the neck was extended and  the head supported in the standard fashion.  The lower neck was palpated  with the findings as described above.  A previously identified skin  wrinkle was infiltrated with 1% Xylocaine with 1:100,000 epinephrine,  4.5 mL total for intraoperative hemostasis.  Several minutes were  allowed for this to take effect.  A sterile preparation and draping of  the low neck was accomplished in the standard fashion.   The orienting initials were identified.  Time-out was carried out in the  standard fashion.  An 8-cm transverse incision was marked in the  preexisting skin wrinkle and crosshatched in the midline.  This was  sharply executed and carried down through the skin and into the  subcutaneous fat.   Cautery was used for hemostasis.  The platysma muscle  was lysed on both sides and subplatysmal planes were raised superiorly  and inferiorly.  The Mahorner retractor was placed in the standard  fashion.   The midline raphe of the strap muscles was divided in several layers and  muscles were peeled away from the anterior surface of the thyroid gland.  There was a palpable nodule just to the right of the midline on the  lower aspect of the thyroid isthmus.  We elected to include this in the  surgical specimen.  Working above and below the isthmus, vessels were  controlled with clips and silk ligature.  The isthmus was raised just to  the right of midline and the thyroid was isolated between hemostats and  divided.  The right side was suture ligated.  The left side was  cauterized.   Working down the inferior edge of the thyroid gland, along the superior  pole, on medial surface, the thyroid was gradually raised.  Vessels were  controlled with silk ligature or Ligaclips.  Bovie cautery was used to  lyse soft  tissues.  Working around the inferior pole, the lobe was  mobilized medially and then the attention was turned to the posterior  surface of the superior pole and then finally anterointerior pole as the  gland was rolled medially.  The recurrent laryngeal nerve was identified  and not disturbed.  It was carefully dissected up to its entry point  including what appeared to be one external branch which was protected.  Upon identifying and protecting the nerve, the remainder of the gland  was dissected away from Lawn ligament sharply.  A small amount of  bipolar cautery was used for hemostasis in the vicinity of the nerve.  The specimen was sent for frozen section interpretation.  The wound was  thoroughly irrigated and hemostasis was observed.  Valsalva was applied  and again hemostasis was observed.   A 10-French Silastic rubber round drain was placed into the left thyroid  bed and  brought out to the midline of the incision and secured to the  skin with a 3-0 nylon stitch.  The raphe of the strap muscles was  reapproximated using interrupted 4-0 chromic sutures.  The platysma  plane was reapproximated using 4-0 chromic suture.  At this point, the  frozen section returned as a follicular lesion with lymphocytic  thyroiditis-type changes.  No evidence of cancer.  No completion  thyroidectomy was felt indicated.  Hemostasis was again observed.  The  skin was closed with a running subcuticular 5-0 Ethilon in a cosmetic  fashion.  Again, hemostasis was observed.  A drain was placed to suction  and observed to be functioning adequately.  At this point, the procedure  was completed.  The patient was undraped and the wound area was cleaned  and then a small amount of bacitracin ointment was applied to the wound  itself.  The patient was returned to Anesthesia, awakened, extubated,  and transferred to Recovery in stable condition.   COMMENTS:  A 60 year old white female with an incidental finding of a  thyroid mass led to a workup which included an ultrasound showing a 4.4-  cm mass and a fine-needle aspiration was suggestive some Hurthle cell-  type changes, hence indication for today's procedure.  Anticipated  routine postoperative recovery with attention to ice, elevation, suction  drainage x24 hours, and analgesia.      Gloris Manchester. Lazarus Salines, M.D.  Electronically Signed     KTW/MEDQ  D:  04/26/2009  T:  04/26/2009  Job:  [id number]   cc:   [name], MD

## 2011-02-05 NOTE — Discharge Summary (Signed)
NAMEJODETTE, Rebecca Marshall              ACCOUNT NO.:  [id number]   MEDICAL RECORD NO.:  [id number]          PATIENT TYPE:  OBV   LOCATION:  3715                         FACILITY:  [facility]   PHYSICIAN:  [name], MD        DATE OF BIRTH:  [birth year]   DATE OF ADMISSION:  02/22/2009  DATE OF DISCHARGE:  02/23/2009                               DISCHARGE SUMMARY   PRIMARY CARE PHYSICIAN:  [name], MD of the [facility].   DISCHARGE DIAGNOSES:  1. Postviral acute bronchospasm.  2. Chronic fibromyalgia.  3. Gastroesophageal reflux disease.   DISCHARGE MEDICATIONS:  1. Azithromycin 250 mg by mouth daily, take for 3 days.  2. Prednisone 10 mg daily, take for 3 days.  3. Ativan 1.0 mg by mouth twice daily as needed for      anxiety/bronchospasm.  4. Allegra 180 mg by mouth daily.  5. Nexium 40 mg by mouth daily.  6. Digoxin 0.25 mg by mouth daily.  7. Celexa 40  mg by mouth daily.  8. Flexeril 10 mg by mouth 2-3 times a day.   1. Aspirin 81 mg by mouth daily.  2. Relpax 40 mg with instructions for headache.  3. Fosamax 70 mg by mouth each week.  4. Albuterol 2 puffs every 4-6 hours as needed shortness of breath.   IMAGING:  Chest x-ray on February 23, 2009, impression, no active disease.   BRIEF HOSPITAL COURSE:  Ms. Rebecca Marshall is a very pleasant 60-year-  old lady that was admitted for an acute bronchospasm following a recent  viral infection.  Please see H and P for further details.  1. Acute bronchospasm.  Ms. Rebecca Marshall was admitted for observation      overnight.  She did have one episode during which time she had      short of breath and very panicky.  Her O2 saturations maintained      above 95% during this time.  She was treated with Ativan and      morphine with resolution of her shortness of breath.  She was also      monitored on telemetry with no events.  Albuterol and neb was      provided throughout her stay.  It is thought that the patient's      acute  shortness of breath is possibly transient vocal cord      dysfunction from her recent prolonged viral infection.  She was      discharged the following morning with instructions to continue      previously prescribed antibiotics, azithromycin for 2 more days,      and prednisone burst.  She was given a prescription for Ativan 1.0      mg by mouth twice daily as needed for anxiety and bronchospasm.      She will follow up with her primary care physician during the next      week.  Dr. Jennette Kettle, her primary care physician followed her throughout      the night and is aware of her entire course.  2. Fibromyalgia.  The patient's home medications were continued      throughout her stay.  Follow up the patient as the patient works at      [employer], she wished to make her own      followup appointment with Dr. Jennette Kettle.      Helane Rima, MD  Electronically Signed      [name], MD  Electronically Signed    EW/MEDQ  D:  02/26/2009  T:  02/27/2009  Job:  [id number]

## 2011-02-05 NOTE — Op Note (Signed)
NAMEMARINE, LEZOTTE              ACCOUNT NO.:  [id number]   MEDICAL RECORD NO.:  [id number]          PATIENT TYPE:  OIB   LOCATION:  5118                         FACILITY:  [facility]   PHYSICIAN:  Zola Button T. Lazarus Salines, M.D. DATE OF BIRTH:  Nov 09, 1950   DATE OF PROCEDURE:  05/11/2009  DATE OF DISCHARGE:                               OPERATIVE REPORT   PREOPERATIVE DIAGNOSIS:  Papillary carcinoma of the thyroid.   POSTOPERATIVE DIAGNOSIS:  Papillary carcinoma of the thyroid.   PROCEDURE PERFORMED:  Completion right thyroidectomy.   SURGEON:  Gloris Manchester. Lazarus Salines, MD   ASSISTANT:  Suzanna Obey, MD   ANESTHESIA:  General orotracheal.   BLOOD LOSS:  Minimal.   COMPLICATIONS:  None.   FINDINGS:  Fibrosis consistent with a prior surgery.  A moderately firm  but normal-excised right thyroid lobe with no discrete masses.  No  palpable lymphadenopathy in either side of the neck.  Recurrent  laryngeal nerve readily identified and preserved.   PROCEDURE:  With the patient in a comfortable supine position, general  orotracheal anesthesia was induced without difficulty.  At an  appropriate level, the patient was placed in a slight reverse  Trendelenburg position.  A shoulder roll was applied and the neck was  extended and the head supported in the standard fashion.  Low neck was  palpated with the findings as described above.  The previous incision  was identified and infiltrated with 1% Xylocaine with 1:100,000  epinephrine, 7 mL total.  Several minutes were allowed for this to take  effect.  A sterile preparation and draping of the low neck was  accomplished in the standard fashion.   The previous incision was identified.  Midline was marked and then  crosshatched.  Leaving approximately 1-2 mm margin, the previous  incision was excised with a total incision length of approximately 8 cm.  Dissection was carried down through the subcutaneous fat on platysma  layer.  The previous surgical sutures  were identified and the wound was  readily opened.  A subplatysmal planes were raised inferiorly and  superiorly.  The Mahorner retractor was applied in the standard fashion.   Fibrosis in the midline was identified and opened sharply and bluntly.  Dissection was carried down to the anterior face of the trachea.  There  was fibrosis of the sternothyroid muscle on top of the thyroid gland  part of which was allowed to remain with the gland.  Dissection was  finally carried out laterally to fresh undisturbed planes and the  dissection was worked around the inferior pole directly on the capsule  taking care to ligate vessels as identified either with silk ligature or  with Ligaclips.  Dissection was carried around the superior pole in the  same fashion.  The isthmus was elevated from the anterior face of the  trachea.  Working around the lateral aspect of the gland, the gland was  rolled medially and vessels were identified and controlled.  The  recurrent laryngeal nerve was readily identified and followed into the  insertion at the cricothyroid junction.  This was carefully protected.  The remainder  of the gland was dissected away from the nerve and finally  through very dense Berry ligament and the lobe was delivered.  A small  amount of bipolar cautery was required for hemostasis.  The lobe was  palpated with the findings as described above.  The nerve was easily  protected, but no parathyroid glands were directly identified.  The lobe  was sent for permanent interpretation.  At this point, hemostasis was  observed.  The wound was thoroughly irrigated.  The patient was given  Valsalva and no other bleeding was identified.   A 10-French round drain was placed in the right thyroid bed and secured  at the skin edge with a 3-0 Ethilon stitch.  The midline raphe of the  strap muscles was closed with interrupted 4-0 chromic suture.  The  platysma layer of the wound was closed with interrupted  3-0 chromic  suture.  Finally, the skin was closed in a cosmetic fashion with a  running subcuticular 5-0 Ethilon stitch.  The drain was observed to be  functional and hemostasis was observed.  At this point, the procedure  was completed.  A shoulder roll had been removed before closing the neck  incision.  The wound was cleaned and a small amount of antibiotic  ointment was applied, bacitracin.  The patient was returned to  Anesthesia, awakened, extubated, and transferred to the Recovery in  stable condition.   COMMENT:  A 60 year old white female status post a left thyroid  lobectomy for a 4-cm mass roughly 3 weeks ago.  The 4-cm mass was  benign, but a smaller lesion at the isthmus was identified as follicular  variant papillary carcinoma indicating the need for completion lobectomy  in anticipation of radioactive thyroid iodine ablation.  Anticipate  routine postoperative recovery with attention to ice, elevation,  analgesia, and suction drainage.  We will observe her calcium for 24  hours to make sure that this is stable.  We will keep her on Synthroid  be placed for several weeks and then discussed removing the Synthroid or  using Thyrogen stimulation for the radioactive iodine ablation.      Gloris Manchester. Lazarus Salines, M.D.  Electronically Signed     KTW/MEDQ  D:  05/11/2009  T:  05/11/2009  Job:  [id number]   cc:   [name], MD  Tonita Cong, M.D.

## 2011-02-05 NOTE — Op Note (Signed)
NAMESORCHA, [name]              ACCOUNT NO.:  [id number]   MEDICAL RECORD NO.:  [id number]          PATIENT TYPE:  INP   LOCATION:  5005                         FACILITY:  [facility]   PHYSICIAN:  Rebecca Marshall, M.D.   DATE OF BIRTH:  06/17/1951   DATE OF PROCEDURE:  06/27/2008  DATE OF DISCHARGE:                               OPERATIVE REPORT   PREOPERATIVE DIAGNOSIS:  End-stage degenerative joint disease, left hip.   POSTOPERATIVE DIAGNOSIS:  End-stage degenerative joint disease, left  hip.   PROCEDURE:  Left total hip replacement with a S-ROM system 20D small  cone, a 36 +8 offset stem with a +3 46-mm ball corresponding to a 52-mm  ASR cup.   SURGEON:  Rebecca Junior, MD   ASSISTANT:  Rebecca Ly, PA   ANESTHESIA:  General.   BRIEF HISTORY:  Rebecca Marshall is a 60 year old female with a long history  of having a significant left hip pain.  We treated conservatively for a  period of time with anti-inflammatory medication, activity modification.  MRI showed that she had vascular changes and plain x-rays showed  narrowing of the hip socket.  She had failed all conservative care  because of continued complaints of hip pain.  She was taken to the  operating room for a total hip replacement.   PROCEDURE:  The patient was taken to the operating room.  After adequate  anesthesia obtained with general anesthetic, the patient was placed  supine on the operating table.  The left hip was prepped and draped in  the usual sterile fashion.  Following this, an incision was made for  posterior approach to the hips.  Subcutaneous tissue was taken down to  the level of the tensor fascia.  First of all initially, the patient was  placed into the right lateral decubitus position, all bony prominences  were padded, and an axillary roll was put in place.  The patient was  then prepped and draped as outlined above, and once that was  accomplished, an incision was made for posterior approach to  the hips.  Subcutaneous tissue was taken down to the level of the abductors.  The  tensor fascia was divided in line with its fibers as well as the gluteus  maximus fascia, which was then finger fractured and a Charnley retractor  was put in place.  The piriformis and short external rotators were taken  down along with the posterior capsule in a group, and once this was  completed, the labrectomy was performed and the acetabulum was exposed  and sequentially reamed to a level of 51 mm.  A 52 ASR cup was then  hammered into place after trials were used to previously appropriate  size.  We put this in 45 degrees of the lateral opening with 30 degrees  of anteversion.  Once this was completed, attention was then turned  towards the stem side, where she was sequentially reamed to a level of  15 and a 15.5 reamer was taken half way down.  A 20 x 15 stem was then  used to drill for the cone,  which was a 18D small cone.  Once this was  completed, attention was turned towards the placement of the trial cone  20D small cone, which seated nicely.  The neck was then cut to this  level of stem.  Once this was completed, attention was turned towards  the placement of a trial stem.  We used a 36 +8 offset 20 x 15 stem,  neutral version with a +0 ball.  It really gave pretty reasonable range  of motion and alignment.  At this point, we did see a tiny bit of  subsidence with the small cone.  Took a large cone and trialed that, but  really that was too prominent, so the small cone we just allowed to go  down and then cut the bone to this level and then used a +3 ball and  that felt like excellent range of motion and stability.  Put 10 degrees  of anteversion and that seemed to really complete the range of motion  and stability.  At this point, the wound was copiously and thoroughly  irrigated, suctioned dry.  The trials were removed.  A 20D small cone  was placed, a 20 x 15 stem with 10 degrees of  anteversion was then  placed through this, and following this, a +3 ball was used.  Excellent  range of motion and stability was achieved, and at this point, the short  external rotators, piriformis were attached to the posterior  intertrochanteric line in a group, and then once that was completed,  again there was irrigation undertaken and then the tensor fascia was  closed with one Vicryl running and the skin with 0 and 2-0 Vicryl and  then ultimately skin staples.  A sterile compressive dressing was  applied, and the patient was taken to recovery room.  She was noted to  be in satisfactory condition.   ESTIMATED BLOOD LOSS:  400 mL.       Rebecca Marshall, M.D.  Electronically Signed     JLG/MEDQ  D:  06/27/2008  T:  06/28/2008  Job:  [id number]

## 2011-02-06 ENCOUNTER — Encounter (HOSPITAL_COMMUNITY)
Admission: RE | Admit: 2011-02-06 | Discharge: 2011-02-06 | Disposition: A | Discharge status: Home / Self Care | Payer: 59 | Source: Ambulatory Visit | Attending: Endocrinology | Admitting: Endocrinology

## 2011-02-08 ENCOUNTER — Encounter (HOSPITAL_COMMUNITY)
Admission: RE | Admit: 2011-02-08 | Discharge: 2011-02-08 | Disposition: A | Discharge status: Home / Self Care | Payer: 59 | Source: Ambulatory Visit | Attending: Endocrinology | Admitting: Endocrinology

## 2011-02-08 DIAGNOSIS — C73 Malignant neoplasm of thyroid gland: Secondary | ICD-10-CM | POA: Insufficient documentation

## 2011-02-08 MED ORDER — SODIUM IODIDE I 131 CAPSULE
4.0000 | Freq: Once | INTRAVENOUS | Status: AC | PRN
  Administered 2011-02-08: 4 via ORAL

## 2011-02-12 ENCOUNTER — Telehealth: Payer: Self-pay | Admitting: *Deleted

## 2011-02-12 NOTE — Telephone Encounter (Signed)
Dr. Jennette Kettle orders for  [facility] referral. Referral was sent 02/07/2011.  Appointment has been scheduled for 02/20/2011 and patient was notified.

## 2011-02-20 ENCOUNTER — Ambulatory Visit: Payer: Commercial Managed Care - PPO | Admitting: Physical Medicine & Rehabilitation

## 2011-03-29 ENCOUNTER — Other Ambulatory Visit (HOSPITAL_COMMUNITY): Payer: Self-pay | Admitting: Orthopedic Surgery

## 2011-03-29 DIAGNOSIS — M545 Low back pain, unspecified: Secondary | ICD-10-CM

## 2011-03-31 ENCOUNTER — Other Ambulatory Visit (HOSPITAL_COMMUNITY): Payer: Self-pay | Admitting: Orthopedic Surgery

## 2011-03-31 ENCOUNTER — Ambulatory Visit (HOSPITAL_COMMUNITY)
Admission: RE | Admit: 2011-03-31 | Discharge: 2011-03-31 | Disposition: A | Discharge status: Home / Self Care | Payer: 59 | Source: Ambulatory Visit | Attending: Orthopedic Surgery | Admitting: Orthopedic Surgery

## 2011-03-31 DIAGNOSIS — M545 Low back pain, unspecified: Secondary | ICD-10-CM

## 2011-03-31 DIAGNOSIS — Q619 Cystic kidney disease, unspecified: Secondary | ICD-10-CM | POA: Insufficient documentation

## 2011-03-31 DIAGNOSIS — M25559 Pain in unspecified hip: Secondary | ICD-10-CM | POA: Insufficient documentation

## 2011-03-31 DIAGNOSIS — M79609 Pain in unspecified limb: Secondary | ICD-10-CM | POA: Insufficient documentation

## 2011-04-24 HISTORY — PX: COLONOSCOPY: SHX174

## 2011-05-06 ENCOUNTER — Encounter: Payer: Self-pay | Admitting: Family Medicine

## 2011-05-06 ENCOUNTER — Other Ambulatory Visit: Payer: Self-pay | Admitting: Family Medicine

## 2011-05-06 ENCOUNTER — Ambulatory Visit (INDEPENDENT_AMBULATORY_CARE_PROVIDER_SITE_OTHER): Payer: 59 | Admitting: *Deleted

## 2011-05-06 DIAGNOSIS — IMO0002 Reserved for concepts with insufficient information to code with codable children: Secondary | ICD-10-CM

## 2011-05-06 DIAGNOSIS — T148XXA Other injury of unspecified body region, initial encounter: Secondary | ICD-10-CM

## 2011-05-06 MED ORDER — ESCITALOPRAM OXALATE 20 MG PO TABS: 20.0000 mg | ORAL_TABLET | Freq: Every day | ORAL | Status: DC

## 2011-05-06 MED ORDER — TETANUS-DIPHTH-ACELL PERTUSSIS 5-2-15.5 LF-MCG/0.5 IM SUSP: 0.5000 mL | Freq: Once | INTRAMUSCULAR | Status: DC

## 2011-05-06 NOTE — Progress Notes (Signed)
More depressed. Denies SI / HI but has some pretty down days--feels like she does not want to do anything, wonders why she is even still living. Increased appetite. We discussed--will switch to lexapro, continue wellbutrin, f/u 2-3 w Also she injured her foot w pitchfork--will get teteanus shot booster today--left heel--small wound, no sign of infection. Superficial

## 2011-05-06 NOTE — Progress Notes (Signed)
Patient stuck a pitch fork in her left outer heel this weekend.  Comes in to day for a Tdap.

## 2011-05-09 ENCOUNTER — Other Ambulatory Visit: Payer: Self-pay | Admitting: Family Medicine

## 2011-05-09 ENCOUNTER — Other Ambulatory Visit: Payer: Self-pay | Admitting: Adult Health

## 2011-05-09 DIAGNOSIS — G4459 Other complicated headache syndrome: Secondary | ICD-10-CM

## 2011-05-09 MED ORDER — ELETRIPTAN HYDROBROMIDE 40 MG PO TABS: 40.0000 mg | ORAL_TABLET | ORAL | Status: DC | PRN

## 2011-05-20 ENCOUNTER — Other Ambulatory Visit: Payer: Self-pay | Admitting: Gastroenterology

## 2011-05-24 ENCOUNTER — Other Ambulatory Visit: Payer: Self-pay | Admitting: Family Medicine

## 2011-05-24 MED ORDER — CYCLOBENZAPRINE HCL 10 MG PO TABS: ORAL_TABLET | ORAL | Status: DC

## 2011-05-24 MED ORDER — TRAZODONE HCL 100 MG PO TABS: ORAL_TABLET | ORAL | Status: DC

## 2011-05-29 ENCOUNTER — Other Ambulatory Visit: Payer: Self-pay | Admitting: Family Medicine

## 2011-05-29 MED ORDER — ONDANSETRON HCL 4 MG PO TABS: 4.0000 mg | ORAL_TABLET | Freq: Three times a day (TID) | ORAL | Status: AC | PRN

## 2011-05-29 MED ORDER — ESCITALOPRAM OXALATE 20 MG PO TABS: 20.0000 mg | ORAL_TABLET | Freq: Every day | ORAL | Status: DC

## 2011-05-29 NOTE — Telephone Encounter (Signed)
Recd request from pharmacy wanting 3 m lexapro--will fill  Dear Cliffton Asters Team Plz call Kennon Rounds--- I received request from pharmacy for zofran--  8 mg zofran daily--I do not know why Dellia would need that--plz tell her I sent in a rx for 320--if she is having daily nausea I need to see her. THANKS! Denny Levy

## 2011-05-30 NOTE — Telephone Encounter (Signed)
Spoke with Kennon Rounds, she is going to set up an appointment to see Dr. Jennette Kettle

## 2011-06-05 ENCOUNTER — Ambulatory Visit (INDEPENDENT_AMBULATORY_CARE_PROVIDER_SITE_OTHER): Payer: 59 | Admitting: Family Medicine

## 2011-06-05 ENCOUNTER — Ambulatory Visit: Payer: 59 | Admitting: Family Medicine

## 2011-06-05 ENCOUNTER — Telehealth: Payer: Self-pay | Admitting: *Deleted

## 2011-06-05 VITALS — BP 116/80 | HR 80 | Temp 98.3°F | Ht 68.0 in | Wt 205.0 lb

## 2011-06-05 DIAGNOSIS — I498 Other specified cardiac arrhythmias: Secondary | ICD-10-CM

## 2011-06-05 DIAGNOSIS — R519 Headache, unspecified: Secondary | ICD-10-CM

## 2011-06-05 DIAGNOSIS — I4719 Other supraventricular tachycardia: Secondary | ICD-10-CM

## 2011-06-05 DIAGNOSIS — Z0000 Encounter for general adult medical examination without abnormal findings: Secondary | ICD-10-CM | POA: Insufficient documentation

## 2011-06-05 DIAGNOSIS — I471 Supraventricular tachycardia: Secondary | ICD-10-CM

## 2011-06-05 DIAGNOSIS — C73 Malignant neoplasm of thyroid gland: Secondary | ICD-10-CM

## 2011-06-05 DIAGNOSIS — R112 Nausea with vomiting, unspecified: Secondary | ICD-10-CM

## 2011-06-05 DIAGNOSIS — R51 Headache: Secondary | ICD-10-CM

## 2011-06-05 HISTORY — DX: Supraventricular tachycardia: I47.1

## 2011-06-05 HISTORY — DX: Encounter for general adult medical examination without abnormal findings: Z00.00

## 2011-06-05 HISTORY — DX: Nausea with vomiting, unspecified: R11.2

## 2011-06-05 HISTORY — DX: Headache, unspecified: R51.9

## 2011-06-05 HISTORY — DX: Other supraventricular tachycardia: I47.19

## 2011-06-05 LAB — TSH: TSH: 0.383 u[IU]/mL (ref 0.350–4.500)

## 2011-06-05 LAB — COMPREHENSIVE METABOLIC PANEL
ALT: 13 U/L (ref 0–35)
AST: 18 U/L (ref 0–37)
Albumin: 4.2 g/dL (ref 3.5–5.2)
Alkaline Phosphatase: 63 U/L (ref 39–117)
BUN: 21 mg/dL (ref 6–23)
CO2: 25 mEq/L (ref 19–32)
Calcium: 9 mg/dL (ref 8.4–10.5)
Chloride: 105 mEq/L (ref 96–112)
Creat: 0.72 mg/dL (ref 0.50–1.10)
Glucose, Bld: 99 mg/dL (ref 70–99)
Potassium: 4.3 mEq/L (ref 3.5–5.3)
Sodium: 142 mEq/L (ref 135–145)
Total Bilirubin: 0.5 mg/dL (ref 0.3–1.2)
Total Protein: 6.7 g/dL (ref 6.0–8.3)

## 2011-06-05 LAB — CBC
HCT: 39.4 % (ref 36.0–46.0)
Hemoglobin: 13.1 g/dL (ref 12.0–15.0)
MCH: 30.9 pg (ref 26.0–34.0)
MCHC: 33.2 g/dL (ref 30.0–36.0)
MCV: 92.9 fL (ref 78.0–100.0)
Platelets: 247 10*3/uL (ref 150–400)
RBC: 4.24 MIL/uL (ref 3.87–5.11)
RDW: 14.2 % (ref 11.5–15.5)
WBC: 4.4 10*3/uL (ref 4.0–10.5)

## 2011-06-05 NOTE — Progress Notes (Signed)
appt made for pt to have Head CT done on Friday 9.14.2012 @ 7 AM at [facility] radiology.   appt made with Dr. Matthias Hughs for  Friday 9.14.2012 @ 1030 am @ [facility].

## 2011-06-05 NOTE — Telephone Encounter (Signed)
Called Dr. Buccini's ofc he has no openings until next month. Lurena Joiner is sending a note back to his nurse to see if pt can be seen any earlier.    appt made for Friday 9.14.2012 @ 7 am at [facility] for head CT.Laureen Ochs, Viann Shove

## 2011-06-05 NOTE — Telephone Encounter (Signed)
Spoke with nurse at Dr. Jaynie Crum[facility] ofc and appt has been made for Friday September 14,2012 @ 1030 AM.  Called and informed pt about appt she stated that she wasn't sure if she was going to be able to keep it. I informed her that she will need to call and reschedule this appt [phone], pt agreed.Laureen Ochs, Viann Shove

## 2011-06-07 ENCOUNTER — Encounter (HOSPITAL_COMMUNITY): Payer: Self-pay

## 2011-06-07 ENCOUNTER — Ambulatory Visit (HOSPITAL_COMMUNITY)
Admission: RE | Admit: 2011-06-07 | Discharge: 2011-06-07 | Disposition: A | Discharge status: Home / Self Care | Payer: 59 | Source: Ambulatory Visit | Attending: Family Medicine | Admitting: Family Medicine

## 2011-06-07 DIAGNOSIS — R42 Dizziness and giddiness: Secondary | ICD-10-CM | POA: Insufficient documentation

## 2011-06-07 DIAGNOSIS — R519 Headache, unspecified: Secondary | ICD-10-CM

## 2011-06-07 DIAGNOSIS — R112 Nausea with vomiting, unspecified: Secondary | ICD-10-CM | POA: Insufficient documentation

## 2011-06-07 DIAGNOSIS — R51 Headache: Secondary | ICD-10-CM | POA: Insufficient documentation

## 2011-06-07 NOTE — Progress Notes (Signed)
  Subjective:    Patient ID: Rebecca Marshall, female    DOB: [birth year], 60 y.o.   MRN: [id number]  HPI Initially scheduled for health maintenance check. She actually has several issues.   2 weeks of nausea with some vomiting. She wakes up about 4:30 in the morning with a headache and nausea and vomits. If she has nausea the rest of the day. She's been using some Zofran. She is under a lot of stress at work. Shows quite depressed. She denies suicidal or homicidal ideation. She thinks her depression is gradually getting better over the last month. She has a lot of episodes of anxiety mostly related to her job. She recently had her colonoscopy and was reportedly negative except for some polyps. She has gained weight in the last 6-12 months and she thinks it's related to her unhappiness with her job. She's not been exercising.  Denies stomach pain or heartburn. Appetite seems okay except when she is she just gets nauseated. Her bowel movements have been normal. She vomits in the morning there is no blood in it. She has some chronic fatigue but that seems essentially unchanged.  Review of Systems    see history of present illness. Objective:   Physical Exam   GENERAL: Well-developed female in no acute distress. HEENT: Pupils equal round reactive to light and accommodation the discs seem flat. NECK: No lymphadenopathy no thyromegaly. There is a well-healed surgical scar. No carotid bruits are noted. CV: Regular rate and rhythm no murmur gallop or rub. LUNGS: Clear to auscultation bilaterally. ABDOMEN: Soft. Positive bowel sounds in all 4 quadrants. No rebound or guarding. I note no mass although exam is mildly limited by her habitus. There is some very slight tenderness in the right upper epigastric area. There is a well-healed cholecystectomy scar.      Assessment & Plan:  #1. Headache on awakening. She says this is entirely different from her typical migraines. Will get CT scan. #2. Nausea  daily. CT scan will be helpful to rule out intracranial causes. I'll send her back to her gastroenterologist for further workup of her fairly recent onset nausea. I'll see her back in 4 weeks. #3. Anxiety depression. Her sugar this is contributing to her nausea. Again urged her to get  into counseling.

## 2011-06-10 ENCOUNTER — Encounter: Payer: Self-pay | Admitting: Family Medicine

## 2011-06-19 ENCOUNTER — Encounter: Payer: Self-pay | Admitting: Family Medicine

## 2011-06-24 LAB — TYPE AND SCREEN
ABO/RH(D): O POS
Antibody Screen: POSITIVE
DAT, IgG: NEGATIVE
Donor AG Type: NEGATIVE

## 2011-06-24 LAB — URINE MICROSCOPIC-ADD ON

## 2011-06-24 LAB — CBC
HCT: 36.6
Hemoglobin: 12.5
MCHC: 34.3
MCV: 90.7
Platelets: 232
RBC: 4.04
RDW: 14.4
WBC: 4.2

## 2011-06-24 LAB — PROTIME-INR
INR: 1
Prothrombin Time: 13.5

## 2011-06-24 LAB — COMPREHENSIVE METABOLIC PANEL
ALT: 35
AST: 31
Albumin: 3.8
Alkaline Phosphatase: 61
BUN: 12
CO2: 28
Calcium: 8.5
Chloride: 105
Creatinine, Ser: 0.69
GFR calc Af Amer: >60
GFR calc non Af Amer: >60
Glucose, Bld: 100 — ABNORMAL HIGH
Potassium: 4.5
Sodium: 137
Total Bilirubin: 0.8
Total Protein: 6.3

## 2011-06-24 LAB — URINALYSIS, ROUTINE W REFLEX MICROSCOPIC
Bilirubin Urine: NEGATIVE
Glucose, UA: NEGATIVE
Ketones, ur: NEGATIVE
Leukocytes, UA: NEGATIVE
Nitrite: NEGATIVE
Protein, ur: NEGATIVE
Specific Gravity, Urine: 1.015
Urobilinogen, UA: 0.2
pH: 7.5

## 2011-06-24 LAB — DIFFERENTIAL
Basophils Absolute: 0.1
Basophils Relative: 1
Eosinophils Absolute: 0.1
Eosinophils Relative: 2
Lymphocytes Relative: 23
Lymphs Abs: 1
Monocytes Absolute: 0.7
Monocytes Relative: 16 — ABNORMAL HIGH
Neutro Abs: 2.4
Neutrophils Relative %: 58

## 2011-06-24 LAB — APTT: aPTT: 25

## 2011-06-25 LAB — BASIC METABOLIC PANEL
BUN: 5 — ABNORMAL LOW
CO2: 26
Calcium: 7.2 — ABNORMAL LOW
Chloride: 100
Creatinine, Ser: 0.7
GFR calc Af Amer: >60
GFR calc non Af Amer: >60
Glucose, Bld: 145 — ABNORMAL HIGH
Potassium: 4.4
Sodium: 131 — ABNORMAL LOW

## 2011-06-25 LAB — CBC
HCT: 27.1 — ABNORMAL LOW
HCT: 27.5 — ABNORMAL LOW
HCT: 27.7 — ABNORMAL LOW
Hemoglobin: 9.2 — ABNORMAL LOW
Hemoglobin: 9.4 — ABNORMAL LOW
Hemoglobin: 9.4 — ABNORMAL LOW
MCHC: 33.8
MCHC: 33.9
MCHC: 34.1
MCV: 89.7
MCV: 91.4
MCV: 91.8
Platelets: 199
Platelets: 217
Platelets: 217
RBC: 2.97 — ABNORMAL LOW
RBC: 3.02 — ABNORMAL LOW
RBC: 3.06 — ABNORMAL LOW
RDW: 14.2
RDW: 14.7
RDW: 14.9
WBC: 10.1
WBC: 10.1
WBC: 11.2 — ABNORMAL HIGH

## 2011-06-25 LAB — PROTIME-INR
INR: 1.1
INR: 1.8 — ABNORMAL HIGH
INR: 2.5 — ABNORMAL HIGH
Prothrombin Time: 14.9
Prothrombin Time: 21.4 — ABNORMAL HIGH
Prothrombin Time: 29 — ABNORMAL HIGH

## 2011-06-25 LAB — TYPE AND SCREEN
ABO/RH(D): O POS
Antibody Screen: POSITIVE

## 2011-06-28 ENCOUNTER — Other Ambulatory Visit: Payer: Self-pay | Admitting: Family Medicine

## 2011-06-28 NOTE — Telephone Encounter (Signed)
Refill request

## 2011-07-15 ENCOUNTER — Other Ambulatory Visit: Payer: Self-pay | Admitting: Family Medicine

## 2011-07-15 DIAGNOSIS — Z1231 Encounter for screening mammogram for malignant neoplasm of breast: Secondary | ICD-10-CM

## 2011-07-29 ENCOUNTER — Encounter: Payer: Self-pay | Admitting: Family Medicine

## 2011-07-29 ENCOUNTER — Ambulatory Visit (INDEPENDENT_AMBULATORY_CARE_PROVIDER_SITE_OTHER): Payer: 59 | Admitting: Family Medicine

## 2011-07-29 VITALS — BP 122/84 | Temp 98.1°F | Ht 68.0 in | Wt 195.0 lb

## 2011-07-29 DIAGNOSIS — F341 Dysthymic disorder: Secondary | ICD-10-CM

## 2011-08-01 NOTE — Progress Notes (Signed)
Increased stressors at work. Worsening sadness and depression. No suicidal or homicidal ideation. Wants to discuss options for counseling with me. Psychiatry vs counseling etc. I rec EAP and she agreed to set up asap. Will f/u w me in next 2 weeks. Sooner with worsening of sx

## 2011-08-05 ENCOUNTER — Telehealth: Payer: Self-pay | Admitting: Internal Medicine

## 2011-08-05 MED ORDER — FLECAINIDE ACETATE 100 MG PO TABS: 100.0000 mg | ORAL_TABLET | Freq: Two times a day (BID) | ORAL | Status: DC

## 2011-08-05 NOTE — Telephone Encounter (Signed)
Pt wants refill of flecainide 90 day supply called to [facility] [phone] please call when done

## 2011-08-07 ENCOUNTER — Other Ambulatory Visit: Payer: Self-pay | Admitting: Family Medicine

## 2011-08-07 MED ORDER — CYCLOBENZAPRINE HCL 10 MG PO TABS: ORAL_TABLET | ORAL | Status: DC

## 2011-09-02 ENCOUNTER — Ambulatory Visit: Payer: 59

## 2011-09-12 ENCOUNTER — Ambulatory Visit
Admission: RE | Admit: 2011-09-12 | Discharge: 2011-09-12 | Disposition: A | Discharge status: Home / Self Care | Payer: 59 | Source: Ambulatory Visit | Attending: Family Medicine | Admitting: Family Medicine

## 2011-09-12 DIAGNOSIS — Z1231 Encounter for screening mammogram for malignant neoplasm of breast: Secondary | ICD-10-CM

## 2011-09-27 ENCOUNTER — Ambulatory Visit: Payer: 59

## 2011-10-28 ENCOUNTER — Other Ambulatory Visit: Payer: Self-pay | Admitting: Family Medicine

## 2011-10-28 NOTE — Telephone Encounter (Signed)
Refill request

## 2011-11-18 ENCOUNTER — Other Ambulatory Visit: Payer: Self-pay | Admitting: Infectious Disease

## 2011-11-19 LAB — OTHER SOLSTAS TEST
Anti Nuclear Antibody(ANA): NEGATIVE
Rhuematoid fact SerPl-aCnc: <10 IU/mL (ref <=14)
Sed Rate: 5 mm/hr (ref 0–22)
Uric Acid, Serum: 4.4 mg/dL (ref 2.4–7.0)

## 2011-11-26 ENCOUNTER — Ambulatory Visit (INDEPENDENT_AMBULATORY_CARE_PROVIDER_SITE_OTHER): Payer: 59 | Admitting: Family Medicine

## 2011-11-26 ENCOUNTER — Encounter: Payer: Self-pay | Admitting: Family Medicine

## 2011-11-26 VITALS — BP 96/52 | HR 66 | Temp 98.6°F | Ht 68.0 in | Wt 209.0 lb

## 2011-11-26 DIAGNOSIS — M999 Biomechanical lesion, unspecified: Secondary | ICD-10-CM | POA: Insufficient documentation

## 2011-11-26 DIAGNOSIS — L700 Acne vulgaris: Secondary | ICD-10-CM

## 2011-11-26 DIAGNOSIS — L708 Other acne: Secondary | ICD-10-CM

## 2011-11-26 DIAGNOSIS — M9981 Other biomechanical lesions of cervical region: Secondary | ICD-10-CM

## 2011-11-26 DIAGNOSIS — G4459 Other complicated headache syndrome: Secondary | ICD-10-CM

## 2011-11-26 HISTORY — DX: Acne vulgaris: L70.0

## 2011-11-26 HISTORY — DX: Biomechanical lesion, unspecified: M99.9

## 2011-11-26 MED ORDER — ELETRIPTAN HYDROBROMIDE 40 MG PO TABS: 40.0000 mg | ORAL_TABLET | ORAL | Status: DC | PRN

## 2011-11-26 MED ORDER — DOXYCYCLINE HYCLATE 100 MG PO TABS: 100.0000 mg | ORAL_TABLET | Freq: Two times a day (BID) | ORAL | Status: AC

## 2011-11-26 NOTE — Assessment & Plan Note (Signed)
After verbal consent pt did have HVLA, with marked improvement.  Gave side effects to look out for and can take anti inflammatories in the acute time frame.  Given exercises as well as taping 2 tennis balls together to help with muscle spasm of the occipital region.

## 2011-11-26 NOTE — Assessment & Plan Note (Signed)
After verbal consent pt did have HVLA, with marked improvement.  Gave side effects to look out for and can take anti inflammatories in the acute time frame.  Gave patient technique for proper lifting techniques.

## 2011-11-26 NOTE — Progress Notes (Signed)
  Subjective:    Patient ID: Rebecca Marshall, female    DOB: [birth year], 61 y.o.   MRN: [id number]  HPI 61 year old female coming in with acute onset of lower back pain and neck pain. Patient states approximately one week ago was lifting things in her garden bent down to pick something up and had severe pain in the right lower back. Patient states this started in the mid back radiating down to the sacral region. Patient states that the lower back has improved but still having some tenderness of the sacral region. Patient denies any radiation of pain down her leg or any bowel or bladder incontinence. Patient still able to do most activities of daily living without any impedance.  Patient is also complaining of neck pain she has had this vague pain that occurs from time to time. Patient states that she gets very bad migraines and usually starts in the occipital region on the right side and seems to stay there was some radiation towards the ice. Patient denies any numbness of the extremities denies any nausea or vomiting denies any aura. Patient has attempted to try to do some massage on it but it has not seemed to improved.  Patient is here today for manipulation therapy for the above mentioned muscle skeletal complaints.  In addition this patient also has had some bumps on her face. Patient states that they come up are very tender usually lasts about a week or 2 and then resolves on its own. Patient states that it is a fairly large but they never come to the skin and never had any discharge. No fevers or chills easily kroner face nowhere else on her body. No recent change in medications or travel history.   Review of Systems As stated above in history of present illness Past medical and surgical history reviewed    Objective:   Physical Exam General: No apparent distress Skin exam: On face patient does have a remnant of a likely a subcutaneous Comedem, resolving subcutaneous acne.  OMT  Findings: Cervical: CT who flexed rotated and side bent right, C4 rotated and side bent left in flexion Thoracic T5 rotated and side bent right and extension Lumbar: L2 flexed rotated and side bent right Sacrum: Left on left sacrum    Assessment & Plan:

## 2011-11-26 NOTE — Assessment & Plan Note (Signed)
Discussed with patient patient was wondering if antibiotics would be of use told her we can do doxycycline for a week when they do occur to see if that decreases inflammation and decreases the amount of time. Also discussed the potential of topicals which patient declined. Discussed wearing sunscreen when out in the sun.

## 2011-11-26 NOTE — Patient Instructions (Signed)
Patient given verbal instructions 

## 2012-01-10 ENCOUNTER — Ambulatory Visit: Payer: 59 | Admitting: Family Medicine

## 2012-02-13 ENCOUNTER — Other Ambulatory Visit: Payer: Self-pay | Admitting: Family Medicine

## 2012-02-13 DIAGNOSIS — K219 Gastro-esophageal reflux disease without esophagitis: Secondary | ICD-10-CM

## 2012-02-28 ENCOUNTER — Other Ambulatory Visit: Payer: 59

## 2012-02-28 ENCOUNTER — Encounter: Payer: Self-pay | Admitting: Family Medicine

## 2012-02-28 ENCOUNTER — Ambulatory Visit (INDEPENDENT_AMBULATORY_CARE_PROVIDER_SITE_OTHER): Payer: 59 | Admitting: Family Medicine

## 2012-02-28 VITALS — BP 123/80 | HR 93 | Ht 68.0 in | Wt 203.0 lb

## 2012-02-28 DIAGNOSIS — M87059 Idiopathic aseptic necrosis of unspecified femur: Secondary | ICD-10-CM

## 2012-02-28 DIAGNOSIS — I498 Other specified cardiac arrhythmias: Secondary | ICD-10-CM

## 2012-02-28 DIAGNOSIS — M25559 Pain in unspecified hip: Secondary | ICD-10-CM

## 2012-02-28 DIAGNOSIS — R5381 Other malaise: Secondary | ICD-10-CM

## 2012-02-28 DIAGNOSIS — F341 Dysthymic disorder: Secondary | ICD-10-CM

## 2012-02-28 DIAGNOSIS — C73 Malignant neoplasm of thyroid gland: Secondary | ICD-10-CM

## 2012-02-28 DIAGNOSIS — M25551 Pain in right hip: Secondary | ICD-10-CM

## 2012-02-28 DIAGNOSIS — R5383 Other fatigue: Secondary | ICD-10-CM

## 2012-02-28 LAB — TSH: TSH: 0.318 u[IU]/mL — ABNORMAL LOW (ref 0.350–4.500)

## 2012-02-28 NOTE — Assessment & Plan Note (Signed)
she is feeling somewhat fatigued and cpmplains of "being cold". I think this is related to multiple stressors but agree to check TSH. Due to f/u endocrine in 2 months.

## 2012-02-28 NOTE — Assessment & Plan Note (Signed)
Significant increase in stressors with the impending loss of her job. We discussed at some length. She remains hopeful that this will be ultimately a positive change for her. I'll see her in followup in 3 months, sooner with problems. Continue her current medications.

## 2012-02-28 NOTE — Assessment & Plan Note (Signed)
Right hip pain today c/w greater trochanteric bursitis CSI INJECTION: Patient was given informed consent, signed copy in the chart. Appropriate time out was taken. Area prepped and draped in usual sterile fashion. 1 cc of methylprednisolone 40 mg/ml plus  4 cc of 1% lidocaine without epinephrine was injected into the ruight greater trochanteric bursa using a(n) perpendicular approach. The patient tolerated the procedure well. There were no complications. Post procedure instructions were given.

## 2012-02-28 NOTE — Progress Notes (Signed)
  Subjective:    Patient ID: Rebecca Marshall, female    DOB: [birth year], 61 y.o.   MRN: [id number]  HPI  #1. Anxiety depression. Impending loss of her job which is rather unexpected. She will reside rather than be fired. She has been very unhappy in this position so she maintains a positive outlook that this will actually be a good thing in the long run. Denies suicidal or homicidal ideation. She is having intermittent tearfulness today. She has been having some problems sleeping at night and has had decrease in energy with some fatigue. She thinks this is probably related to stress but wants to check her thyroid given her history of thyroid cancer. #2. Right hip pain. Worsening of the last 2-3 weeks. She had a knee injury on the right which has since resolved. She thinks the abnormal gait probably cause the hip pain. She would like to be considered for steroid injection today. #3. Fatigue. She agrees this is likely related to her current stressors. She also is a little concerned that she feels called all the time. Denies any dry skin. Denies unusual weight change although she has generally gained weight in the last few months she thinks this is related to her inactivity. #4. Chronic left hip pain seems stable at this time. She is status post total hip replacement for avascular necrosis. The prosthetic hardware was recalls that she had a total hip revision in 2011. She's done fairly well since then.  Review of Systems See history of present illness above.    Objective:   Physical Exam  Vital signs reviewed. GENERAL: Well-developed, well-nourished, no acute distress. CARDIOVASCULAR: Regular rate and rhythm no murmur gallop or rub LUNGS: Clear to auscultation bilaterally, no rales or wheeze. ABDOMEN: Soft positive bowel sounds NEURO: No gross focal neurological deficits. MSK: Movement of extremity x 4. HIP: Right. Tender to palpation over the greater trochanteric area and this reproduces her  pain. Full range of motion in internal and external rotation, flexion. Bilaterally she has 5 out of 5 hip flexor strength. NECK: Well healed thyroid surgery scar. Thyroid is without masses or nodules.     Assessment & Plan:

## 2012-02-28 NOTE — Assessment & Plan Note (Addendum)
Currently no issues Has not had LDl checked in a while so we will draw today

## 2012-02-29 LAB — COMPREHENSIVE METABOLIC PANEL
ALT: 12 U/L (ref 0–35)
AST: 17 U/L (ref 0–37)
Albumin: 4.4 g/dL (ref 3.5–5.2)
Alkaline Phosphatase: 76 U/L (ref 39–117)
BUN: 14 mg/dL (ref 6–23)
CO2: 28 mEq/L (ref 19–32)
Calcium: 8.4 mg/dL (ref 8.4–10.5)
Chloride: 103 mEq/L (ref 96–112)
Creat: 0.62 mg/dL (ref 0.50–1.10)
Glucose, Bld: 96 mg/dL (ref 70–99)
Potassium: 4.3 mEq/L (ref 3.5–5.3)
Sodium: 141 mEq/L (ref 135–145)
Total Bilirubin: 0.4 mg/dL (ref 0.3–1.2)
Total Protein: 7 g/dL (ref 6.0–8.3)

## 2012-02-29 LAB — LDL CHOLESTEROL, DIRECT: Direct LDL: 122 mg/dL — ABNORMAL HIGH

## 2012-03-03 ENCOUNTER — Encounter: Payer: Self-pay | Admitting: Family Medicine

## 2012-03-06 ENCOUNTER — Telehealth: Payer: Self-pay | Admitting: *Deleted

## 2012-03-06 NOTE — Telephone Encounter (Signed)
Patient calling to request refill of steroid dosepak.  C/o "bronchitis"---has non-productive cough & "spasms."  Feels like previous time she had bronchitis. O2 sat---93% on RA.  Has had steroid dosepak in past and it has helped with bronchitis.  Discussed with Dr. Jennette Kettle.  Rip Harbour to call in Z-Pak #1--Take as directed with no refills.  Will need office visit if not better.  Patient informed.  Gaylene Brooks, RN

## 2012-03-27 ENCOUNTER — Ambulatory Visit (INDEPENDENT_AMBULATORY_CARE_PROVIDER_SITE_OTHER): Payer: 59 | Admitting: Family Medicine

## 2012-03-27 ENCOUNTER — Telehealth: Payer: Self-pay | Admitting: Family Medicine

## 2012-03-27 ENCOUNTER — Ambulatory Visit (HOSPITAL_COMMUNITY)
Admission: RE | Admit: 2012-03-27 | Discharge: 2012-03-27 | Disposition: A | Discharge status: Home / Self Care | Payer: 59 | Source: Ambulatory Visit | Attending: Family Medicine | Admitting: Family Medicine

## 2012-03-27 ENCOUNTER — Encounter: Payer: Self-pay | Admitting: Family Medicine

## 2012-03-27 VITALS — BP 120/75 | HR 80 | Temp 98.2°F | Ht 68.0 in | Wt 209.0 lb

## 2012-03-27 DIAGNOSIS — R052 Subacute cough: Secondary | ICD-10-CM

## 2012-03-27 DIAGNOSIS — R05 Cough: Secondary | ICD-10-CM | POA: Insufficient documentation

## 2012-03-27 DIAGNOSIS — R059 Cough, unspecified: Secondary | ICD-10-CM

## 2012-03-27 DIAGNOSIS — R053 Chronic cough: Secondary | ICD-10-CM

## 2012-03-27 DIAGNOSIS — J40 Bronchitis, not specified as acute or chronic: Secondary | ICD-10-CM | POA: Insufficient documentation

## 2012-03-27 DIAGNOSIS — R0602 Shortness of breath: Secondary | ICD-10-CM | POA: Insufficient documentation

## 2012-03-27 DIAGNOSIS — W19XXXA Unspecified fall, initial encounter: Secondary | ICD-10-CM | POA: Insufficient documentation

## 2012-03-27 HISTORY — DX: Subacute cough: R05.2

## 2012-03-27 MED ORDER — DIPHENHYDRAMINE HCL 50 MG PO TABS: 50.0000 mg | ORAL_TABLET | Freq: Every evening | ORAL | Status: DC | PRN

## 2012-03-27 NOTE — Progress Notes (Signed)
Subjective:     Patient ID: Rebecca Marshall, female   DOB: [birth year], 61 y.o.   MRN: [id number]  HPI 61 yo F non-smoker, GERD presents for a same day visit to evaluate non-productive cough and shortness of breath x 5 weeks. Patient was evaluated at the beginning or her illness and treated with azithromycin x 5 day course which did not improve her symptoms. She admits to coughing at night and feeling short of breath at night to the point where she has to sit in front of a fan. She admits to sick contacts in the form of her boyfriend with similar symptoms. She denies chest pain,  fever, night sweats, weigh gain, runny nose, itchy eyes, sneezing and reflux. She has taken Dextromethorphan and Guafenesin with temporary relief of symptoms.   When asked, she is most concerned about lung CA given her 61 yo second hand smoke exposure from her parents.  Review of Systems As per HPI  Objective:   Physical Exam BP 120/75  Pulse 80  Temp 98.2 F (36.8 C) (Oral)  Ht 5\' 8"  (1.727 m)  Wt 209 lb (94.802 kg)  BMI 31.78 kg/m2  SpO2 94% General appearance: alert, cooperative and no distress Head: Normocephalic, without obvious abnormality, atraumatic Eyes: conjunctivae/corneas clear. PERRL, EOM's intact.  Ears: normal TM's and external ear canals both ears with a small amount of cerumen in her R ear.  Nose: Nares normal. Septum midline. Mucosa normal. No drainage or sinus tenderness. Throat: lips, mucosa, and tongue normal; teeth and gums normal Neck: no adenopathy, no carotid bruit, no JVD, supple, symmetrical, trachea midline and thyroid not enlarged, symmetric, no tenderness/mass/nodules Lungs: clear to auscultation bilaterally Heart: regular rate and rhythm, S1, S2 normal, no murmur, click, rub or gallop Lymph nodes: Cervical, supraclavicular, and axillary nodes normal.  Assessment and Plan:

## 2012-03-27 NOTE — Patient Instructions (Addendum)
Rebecca Marshall,   Thank you for coming in today. I will call  your cell [phone] with your x-ray results.  If there is evidence of pneumonia or bronchitis on the x-ray I will retreat with a dose of antibiotics, likely doxycycline +/- steroids.   Please call and come in if your symptoms acute worsen, you feel lightheaded or develop chest pain.  Dr. Armen Pickup

## 2012-03-27 NOTE — Telephone Encounter (Signed)
Reviewed CXR findings (normal) and care plan for subacute cough with the patient.  Plan to start nightly benadryl.

## 2012-03-27 NOTE — Assessment & Plan Note (Addendum)
A: cough x 5 weeks. Intermittently improved with OTC cough suppressant. Afebrile, new evidence of allergic rhinitis/sinusitis on history or exam. Patient denies decline in GERD control. Hight suspect post-infections cough syndrome. Dicussed increasing symptomatic treatment with tessalon perles and tylenol with codeine and patient declined both. She is most concerned about possible lung cancer given her history of significant second hand smoke exposure.  P: -CXR: ordered and reviewed. Normal CXR. No evidence of pneumonia or bronchitis to warrant treatment with systemic antibiotics or steroids at this time. -reassurance  Step wise treatment: 1. treat for possible allergic component with nightly benadryl.  2. If no relief with benadryl increase PPI 3. If not relief with PPI, send for PFTs ? Reactive airway disease which is possible but less likely given symptoms are not responsive to albuterol.

## 2012-03-30 ENCOUNTER — Other Ambulatory Visit: Payer: Self-pay | Admitting: Endocrinology

## 2012-03-30 ENCOUNTER — Ambulatory Visit
Admission: RE | Admit: 2012-03-30 | Discharge: 2012-03-30 | Disposition: A | Discharge status: Home / Self Care | Payer: 59 | Source: Ambulatory Visit | Attending: Endocrinology | Admitting: Endocrinology

## 2012-03-30 DIAGNOSIS — M8580 Other specified disorders of bone density and structure, unspecified site: Secondary | ICD-10-CM

## 2012-04-01 ENCOUNTER — Telehealth: Payer: Self-pay | Admitting: Family Medicine

## 2012-04-01 ENCOUNTER — Encounter: Payer: Self-pay | Admitting: Family Medicine

## 2012-04-01 ENCOUNTER — Other Ambulatory Visit: Payer: 59

## 2012-04-01 NOTE — Telephone Encounter (Signed)
Rebecca Marshall needs a letter stating that she needs to wear tennis shoes on the job.  She would like to pick this up when completed.

## 2012-04-01 NOTE — Telephone Encounter (Signed)
Rebecca Marshall It is ready and I will bring up to you in a minute S

## 2012-04-16 ENCOUNTER — Ambulatory Visit (INDEPENDENT_AMBULATORY_CARE_PROVIDER_SITE_OTHER): Payer: 59 | Admitting: Family Medicine

## 2012-04-16 ENCOUNTER — Encounter: Payer: Self-pay | Admitting: Family Medicine

## 2012-04-16 VITALS — BP 113/77 | HR 78

## 2012-04-16 DIAGNOSIS — L570 Actinic keratosis: Secondary | ICD-10-CM

## 2012-04-16 DIAGNOSIS — F341 Dysthymic disorder: Secondary | ICD-10-CM

## 2012-04-16 DIAGNOSIS — K219 Gastro-esophageal reflux disease without esophagitis: Secondary | ICD-10-CM

## 2012-04-16 HISTORY — DX: Actinic keratosis: L57.0

## 2012-04-16 MED ORDER — RANITIDINE HCL 300 MG PO CAPS: 300.0000 mg | ORAL_CAPSULE | Freq: Two times a day (BID) | ORAL | Status: DC

## 2012-04-16 MED ORDER — TRAZODONE HCL 100 MG PO TABS: ORAL_TABLET | ORAL | Status: DC

## 2012-04-16 MED ORDER — CYCLOBENZAPRINE HCL 10 MG PO TABS: ORAL_TABLET | ORAL | Status: DC

## 2012-04-16 MED ORDER — ZOSTER VACCINE LIVE 19400 UNT/0.65ML ~~LOC~~ SOLR: 0.6500 mL | Freq: Once | SUBCUTANEOUS | Status: AC

## 2012-04-16 MED ORDER — FLUOXETINE HCL 20 MG PO TABS: 20.0000 mg | ORAL_TABLET | Freq: Every day | ORAL | Status: DC

## 2012-04-16 MED ORDER — NORTRIPTYLINE HCL 25 MG PO CAPS: 25.0000 mg | ORAL_CAPSULE | Freq: Every day | ORAL | Status: DC

## 2012-04-16 MED ORDER — FLECAINIDE ACETATE 100 MG PO TABS: 100.0000 mg | ORAL_TABLET | Freq: Two times a day (BID) | ORAL | Status: DC

## 2012-04-16 NOTE — Progress Notes (Signed)
Subjective:     Patient ID: Rebecca Marshall, female   DOB: 03-02-1951, 61 y.o.   MRN: [id number]  HPI 61 yo F comes for med review. She is switching jobs Education officer, [employer]. She would like all possible medications substituted for one on the [employer] $4 list.  1. Med review: compliant with all meds except relpax for migraines. Not helping.   Nexium---> Ranitidine  Esciltalopram ---> Citalopram   Bupropion --> Nortriptyline.   Endocrinology: handling synthroid.   Same: flexeril, trazodone and flecainide.  2. Health maintenance: due for zostavax in 4 months request script. 3. Chronic hip pain: handicap placard expires soon. request renewal.  4. Skin lesion: on R arm and chest. New. Patient is very fair and avoid the sun.    Past Medical History  Diagnosis Date  . Atrial dysrhythmia   . Fibromyalgia   . Cancer     thyroid-status post resection on replacement Dr. Talmage Nap  . Anxiety   . Depression   . Thyroid disease   . GE reflux   . Arthritis    Review of Systems As per HPI     Objective:   Physical Exam BP 113/77  Pulse 78 General appearance: alert, cooperative and no distress Skin:  Waxy hyperpigmented papule R shoulder. Lichenified papule center chest.   Central chest lesion consistent with AK: treated with cryotherapy.  R arm lesion Seb K, will observe for now.   Assessment and Plan:

## 2012-04-16 NOTE — Patient Instructions (Signed)
Arnita,  Thank you for coming in today.  For your antidepressant regimen: 1. Wean wellbutrin, by spacing by one every other day x 1 week, the every two days x 5 days, then every 3 days until out. 2. Wean celexa, same as above for wellbutrin. 3. The combo of tca and flecainide can cause QT prolongation so if you feel any palpitation please stop the medicine. Your Qt can been normal in the past. 4. The combo of tca and ssri can raise tca levels so your nortritylline dose will be kept towards the lower in.   To taper up on your new antidepressants please f/u with Dr. Jennette Kettle or myself two weeks after starting.  Dr. Armen Pickup

## 2012-04-16 NOTE — Assessment & Plan Note (Signed)
Switch to prozac and pamelor due to cost. Taper per AVS. F/u in 2 weeks following initiation of new medications.  Reviewed possible side effects and need for close f/u with myself or the pateint's PCP.

## 2012-04-16 NOTE — Assessment & Plan Note (Signed)
Switch to zantac.

## 2012-04-16 NOTE — Assessment & Plan Note (Signed)
Central chest. Treated with cryotherapy.

## 2012-07-03 ENCOUNTER — Other Ambulatory Visit: Payer: Self-pay | Admitting: Family Medicine

## 2012-07-03 MED ORDER — LEVOTHYROXINE SODIUM 137 MCG PO TABS: 137.0000 ug | ORAL_TABLET | Freq: Every day | ORAL | Status: DC

## 2012-07-03 NOTE — Telephone Encounter (Signed)
Patient did not realize that she didn't have enough of her medication to last the weekend.  She needes a refill on Levothyroxine to [facility] [place] [facility] [place].

## 2012-07-03 NOTE — Telephone Encounter (Signed)
Spoke with patient, informed her that 1 month supply was sent in. Patient has an appointment Wednesday with Dr. Jennette Kettle

## 2012-07-08 ENCOUNTER — Encounter: Payer: Self-pay | Admitting: Family Medicine

## 2012-07-08 ENCOUNTER — Ambulatory Visit (INDEPENDENT_AMBULATORY_CARE_PROVIDER_SITE_OTHER): Payer: BC Managed Care – PPO | Admitting: Family Medicine

## 2012-07-08 VITALS — BP 116/69 | HR 88 | Temp 99.0°F | Ht 67.25 in | Wt 218.0 lb

## 2012-07-08 DIAGNOSIS — J309 Allergic rhinitis, unspecified: Secondary | ICD-10-CM

## 2012-07-08 DIAGNOSIS — IMO0001 Reserved for inherently not codable concepts without codable children: Secondary | ICD-10-CM

## 2012-07-08 DIAGNOSIS — C73 Malignant neoplasm of thyroid gland: Secondary | ICD-10-CM

## 2012-07-08 DIAGNOSIS — F341 Dysthymic disorder: Secondary | ICD-10-CM

## 2012-07-08 MED ORDER — FLUTICASONE-SALMETEROL 100-50 MCG/DOSE IN AEPB: 1.0000 | INHALATION_SPRAY | Freq: Two times a day (BID) | RESPIRATORY_TRACT | Status: DC

## 2012-07-08 MED ORDER — DIAZEPAM 10 MG PO TABS: 10.0000 mg | ORAL_TABLET | Freq: Every evening | ORAL | Status: DC | PRN

## 2012-07-08 MED ORDER — LEVOTHYROXINE SODIUM 150 MCG PO TABS: 150.0000 ug | ORAL_TABLET | Freq: Every day | ORAL | Status: DC

## 2012-07-08 NOTE — Progress Notes (Signed)
  Subjective:    Patient ID: Rebecca Marshall, female    DOB: [birth year], 61 y.o.   MRN: [id number]  HPI  #1. Having some problems with coughing and a little bit of wheezing. She notices wheezing when she goes out into cold air or when she is around perfumes. Also having a nonproductive cough particularly at these times. No cough during the night. #2. Having episodic diarrhea versus constipation. She's also occasionally seen bright red blood per rectum it think this is related to hemorrhoids or fissure because it is scant in amount. She had a colonoscopy last year that was normal. #3. Anxiety and muscle spasms. She has used one value per day intermittently and this is been quite successful. She needs a refill #4. Depression. She's doing pretty well her current medications. She is in the job and that is working out well for her.  Review of Systems See history of present illness    Objective:   Physical Exam  Vital signs reviewed GENERALl: Well developed, well nourished, in no acute distress. NECK: Supple, FROM, without lymphadenopathy.  THYROID: Nonpalpable. Well-healed thyroidectomy scar CAROTID ARTERIES: without bruits LUNGS: clear to auscultation bilaterally. No wheezes or rales. HEART: Regular rate and rhythm, no murmurs ABDOMEN: soft with positive bowel sounds MSK: MOE x 4 SKIN no rash NEURO: no focal deficits       Assessment & Plan:  #1. Status post thyroid cancer with thyroidectomy. I will refill her levothyroxine. Her endocrinologist is following her TSH yearly and she's not due for another 4 months. #2. Cough and question of bronchospasm. Unclear what this is. I will give her one month of Advair and then have her followup. Today we did she flows and her average was 400. #3. Irritable bowel syndrome most likely. She will follow the right red blood per rectum if that worsens her continue she'll let me know. I feel somewhat reassured that she had a normal colonoscopy last  year. We'll start her back on fiber. #4. Anxiety and depression. Refill her medicines. She seems to be doing quite well. I'll see her back for regular followup around the first of the year.

## 2012-07-08 NOTE — Patient Instructions (Addendum)
I refilled your thyroid and anxiety meds.Marland Kitchenibuprofen am starting you on Advair---let's try it for a month---if not improving come back and see me. I would definitely start using the fiber supplement---start 1/4 scoop---use daily. Gradually taper up. GREAT TO SEE YOU AGAIN. See me in the new year!

## 2012-07-09 ENCOUNTER — Encounter: Payer: Self-pay | Admitting: Family Medicine

## 2012-08-10 ENCOUNTER — Other Ambulatory Visit: Payer: Self-pay | Admitting: Family Medicine

## 2012-08-10 DIAGNOSIS — Z1231 Encounter for screening mammogram for malignant neoplasm of breast: Secondary | ICD-10-CM

## 2012-09-14 ENCOUNTER — Ambulatory Visit
Admission: RE | Admit: 2012-09-14 | Discharge: 2012-09-14 | Disposition: A | Discharge status: Home / Self Care | Payer: BC Managed Care – PPO | Source: Ambulatory Visit | Attending: Family Medicine | Admitting: Family Medicine

## 2012-09-14 DIAGNOSIS — Z1231 Encounter for screening mammogram for malignant neoplasm of breast: Secondary | ICD-10-CM

## 2012-09-25 ENCOUNTER — Encounter (INDEPENDENT_AMBULATORY_CARE_PROVIDER_SITE_OTHER): Payer: Self-pay | Admitting: Surgery

## 2012-09-28 ENCOUNTER — Ambulatory Visit (INDEPENDENT_AMBULATORY_CARE_PROVIDER_SITE_OTHER): Payer: Self-pay | Admitting: Surgery

## 2012-09-28 ENCOUNTER — Encounter (INDEPENDENT_AMBULATORY_CARE_PROVIDER_SITE_OTHER): Payer: Self-pay | Admitting: Surgery

## 2012-09-28 ENCOUNTER — Ambulatory Visit (INDEPENDENT_AMBULATORY_CARE_PROVIDER_SITE_OTHER): Payer: BC Managed Care – PPO | Admitting: Surgery

## 2012-09-28 VITALS — BP 110/82 | HR 84 | Temp 98.1°F | Resp 16 | Ht 67.25 in | Wt 224.4 lb

## 2012-09-28 DIAGNOSIS — K648 Other hemorrhoids: Secondary | ICD-10-CM

## 2012-09-28 NOTE — Progress Notes (Signed)
Subjective:     Patient ID: Rebecca Marshall, female   DOB: 1950/11/15, 62 y.o.   MRN: [id number]  HPI She is a self-referral for bleeding internal hemorrhoids. These were confirmed on sigmoidoscopy last month. She has failed conservative management. She has no issues of incontinence.  Review of Systems     Objective:   Physical Exam Rectal exam was performed. This was normal. I performed endoscopy and saw small internal hemorrhoids. These were broad-based so I could not band them so I injected sclerosant    Assessment:     Bleeding internal hemorrhoids    Plan:     She will continue her high fiber diet. I will see her back in one month. If this does not work, she will need exam under anesthesia.

## 2012-11-17 ENCOUNTER — Telehealth: Payer: Self-pay | Admitting: *Deleted

## 2012-11-17 MED ORDER — ESOMEPRAZOLE MAGNESIUM 40 MG PO PACK: 40.0000 mg | PACK | Freq: Every day | ORAL | Status: DC

## 2012-11-17 NOTE — Telephone Encounter (Addendum)
Patient comes to office and states Ranitidine is not working for her and she wants to go back on Nexium. Will forward message to Dr. Jennette Kettle. Please send to [facility].  She is changing pharmacies.

## 2012-11-23 ENCOUNTER — Ambulatory Visit: Payer: BC Managed Care – PPO | Admitting: Family Medicine

## 2012-12-11 ENCOUNTER — Other Ambulatory Visit: Payer: Self-pay | Admitting: *Deleted

## 2012-12-11 MED ORDER — HYDROCODONE-ACETAMINOPHEN 5-325 MG PO TABS: 1.0000 | ORAL_TABLET | Freq: Four times a day (QID) | ORAL | Status: DC | PRN

## 2012-12-11 MED ORDER — HYDROCODONE-ACETAMINOPHEN 5-500 MG PO TABS: 1.0000 | ORAL_TABLET | Freq: Four times a day (QID) | ORAL | Status: DC | PRN

## 2012-12-14 ENCOUNTER — Ambulatory Visit (INDEPENDENT_AMBULATORY_CARE_PROVIDER_SITE_OTHER): Payer: BC Managed Care – PPO | Admitting: Family Medicine

## 2012-12-14 ENCOUNTER — Encounter: Payer: Self-pay | Admitting: Family Medicine

## 2012-12-14 VITALS — BP 128/86 | HR 80 | Ht 67.5 in | Wt 224.0 lb

## 2012-12-14 DIAGNOSIS — C73 Malignant neoplasm of thyroid gland: Secondary | ICD-10-CM

## 2012-12-14 DIAGNOSIS — F341 Dysthymic disorder: Secondary | ICD-10-CM

## 2012-12-14 DIAGNOSIS — M25569 Pain in unspecified knee: Secondary | ICD-10-CM

## 2012-12-14 DIAGNOSIS — M25562 Pain in left knee: Secondary | ICD-10-CM

## 2012-12-14 DIAGNOSIS — I498 Other specified cardiac arrhythmias: Secondary | ICD-10-CM

## 2012-12-14 MED ORDER — FLUOXETINE HCL 40 MG PO CAPS: 40.0000 mg | ORAL_CAPSULE | Freq: Every day | ORAL | Status: DC

## 2012-12-14 MED ORDER — DIAZEPAM 10 MG PO TABS: 10.0000 mg | ORAL_TABLET | Freq: Every evening | ORAL | Status: DC | PRN

## 2012-12-14 MED ORDER — PANTOPRAZOLE SODIUM 40 MG PO TBEC: 40.0000 mg | DELAYED_RELEASE_TABLET | Freq: Two times a day (BID) | ORAL | Status: DC

## 2012-12-15 NOTE — Assessment & Plan Note (Addendum)
More depression than she would like so we will increase her fluoxetine. They'll think theo nortriptyline is helping her so we'll discontinue that. Might also be contributing to some of her weight gain and sluggishness. I'll see her back in 4 weeks.

## 2012-12-15 NOTE — Progress Notes (Signed)
  Subjective:    Patient ID: Rebecca Marshall, female    DOB: [birth year], 62 y.o.   MRN: [id number]  HPI  #1. Left knee pain. 2 weeks. No specific injury. Pain has worsened over the 2 week period, constant now 9/10 at times. Sharp. Worse with weightbearing. Unable to go up steps w well and also hurts when she bends her knee. It feels like it's stiff and swollen. No redness. No complaints prior with this knee. #2. Followup depression. Does not feel she's doing as well on the current regimen ias on the old one. Like to chang medication regimen. Really likes her new job. Continues in her personal relationship and has some issues with that. Feels safe.prior history of problems #3. History of thyroid cancer. On a stable dose of replacement. She has not seen her endocrinologist in a while but has a followup next month. She does not need refills. #4. GERD: Doing well on pantoprazole. She has been taking it twice a day and needs refills. .  Review of Systems Denies fever, sweats, chills. No heat or cold intolerance. No unusual weight gain or loss although she is a higher weight than she would like. She is not exercising regularly. No appetite changes. No particular anhedonia or anxiety. Continues to slightly less than ideal mood but it is stable    Objective:   Physical Exam  vital signs are reviewed GENERAL: Well-developed female no acute distress ;; KNEE: Left. Full range of motion extension and flexion. Positive McMurray's. Mild effusion. Neck palpation diffusely across the anterior portion of the knee.Marland Kitchen NECK: No lymphadenopathy. No thyromegaly or nodularity. No JVD. PSYCHIATRIC: Alert and oriented x4. Affect is interactive. Asks answers questions appropriately. Speech is normal in content and fluency.       Assessment & Plan:   #1. Knee pain. Her orthopedist as her a set her up for MRI this week. Until then I would do nothing different except maybe had some icing.

## 2012-12-15 NOTE — Assessment & Plan Note (Signed)
Currently stable on her antiarrhythmic without problems.

## 2012-12-15 NOTE — Assessment & Plan Note (Signed)
Will follow up with her endocrinologist next month. Continue thyroid supplementation.

## 2012-12-28 ENCOUNTER — Other Ambulatory Visit: Payer: Self-pay | Admitting: *Deleted

## 2012-12-29 MED ORDER — FLECAINIDE ACETATE 100 MG PO TABS: 100.0000 mg | ORAL_TABLET | Freq: Two times a day (BID) | ORAL | Status: DC

## 2012-12-29 MED ORDER — TRAZODONE HCL 100 MG PO TABS: ORAL_TABLET | ORAL | Status: DC

## 2012-12-29 MED ORDER — LEVOTHYROXINE SODIUM 150 MCG PO TABS: 150.0000 ug | ORAL_TABLET | Freq: Every day | ORAL | Status: DC

## 2013-01-05 ENCOUNTER — Encounter (HOSPITAL_COMMUNITY): Payer: Self-pay | Admitting: *Deleted

## 2013-01-05 ENCOUNTER — Emergency Department (HOSPITAL_COMMUNITY)
Admission: EM | Admit: 2013-01-05 | Discharge: 2013-01-05 | Disposition: A | Discharge status: Home / Self Care | Payer: BC Managed Care – PPO | Attending: Emergency Medicine | Admitting: Emergency Medicine

## 2013-01-05 ENCOUNTER — Emergency Department (HOSPITAL_COMMUNITY): Payer: BC Managed Care – PPO

## 2013-01-05 DIAGNOSIS — F411 Generalized anxiety disorder: Secondary | ICD-10-CM | POA: Insufficient documentation

## 2013-01-05 DIAGNOSIS — H538 Other visual disturbances: Secondary | ICD-10-CM | POA: Insufficient documentation

## 2013-01-05 DIAGNOSIS — W07XXXA Fall from chair, initial encounter: Secondary | ICD-10-CM | POA: Insufficient documentation

## 2013-01-05 DIAGNOSIS — Z79899 Other long term (current) drug therapy: Secondary | ICD-10-CM | POA: Insufficient documentation

## 2013-01-05 DIAGNOSIS — Y9389 Activity, other specified: Secondary | ICD-10-CM | POA: Insufficient documentation

## 2013-01-05 DIAGNOSIS — S0990XA Unspecified injury of head, initial encounter: Secondary | ICD-10-CM | POA: Insufficient documentation

## 2013-01-05 DIAGNOSIS — F3289 Other specified depressive episodes: Secondary | ICD-10-CM | POA: Insufficient documentation

## 2013-01-05 DIAGNOSIS — F329 Major depressive disorder, single episode, unspecified: Secondary | ICD-10-CM | POA: Insufficient documentation

## 2013-01-05 DIAGNOSIS — R42 Dizziness and giddiness: Secondary | ICD-10-CM | POA: Insufficient documentation

## 2013-01-05 DIAGNOSIS — Y929 Unspecified place or not applicable: Secondary | ICD-10-CM | POA: Insufficient documentation

## 2013-01-05 DIAGNOSIS — Z8739 Personal history of other diseases of the musculoskeletal system and connective tissue: Secondary | ICD-10-CM | POA: Insufficient documentation

## 2013-01-05 DIAGNOSIS — K219 Gastro-esophageal reflux disease without esophagitis: Secondary | ICD-10-CM | POA: Insufficient documentation

## 2013-01-05 DIAGNOSIS — E079 Disorder of thyroid, unspecified: Secondary | ICD-10-CM | POA: Insufficient documentation

## 2013-01-05 DIAGNOSIS — W19XXXA Unspecified fall, initial encounter: Secondary | ICD-10-CM

## 2013-01-05 DIAGNOSIS — Z85850 Personal history of malignant neoplasm of thyroid: Secondary | ICD-10-CM | POA: Insufficient documentation

## 2013-01-05 DIAGNOSIS — Z8679 Personal history of other diseases of the circulatory system: Secondary | ICD-10-CM | POA: Insufficient documentation

## 2013-01-05 DIAGNOSIS — M810 Age-related osteoporosis without current pathological fracture: Secondary | ICD-10-CM | POA: Insufficient documentation

## 2013-01-05 LAB — POCT I-STAT, CHEM 8
BUN: 16 mg/dL (ref 6–23)
Calcium, Ion: 1.07 mmol/L — ABNORMAL LOW (ref 1.13–1.30)
Chloride: 105 mEq/L (ref 96–112)
Creatinine, Ser: 0.9 mg/dL (ref 0.50–1.10)
Glucose, Bld: 105 mg/dL — ABNORMAL HIGH (ref 70–99)
HCT: 44 % (ref 36.0–46.0)
Hemoglobin: 15 g/dL (ref 12.0–15.0)
Potassium: 3.5 mEq/L (ref 3.5–5.1)
Sodium: 141 mEq/L (ref 135–145)
TCO2: 26 mmol/L (ref 0–100)

## 2013-01-05 LAB — URINALYSIS, ROUTINE W REFLEX MICROSCOPIC
Bilirubin Urine: NEGATIVE
Glucose, UA: NEGATIVE mg/dL
Ketones, ur: NEGATIVE mg/dL
Nitrite: NEGATIVE
Protein, ur: NEGATIVE mg/dL
Specific Gravity, Urine: 1.015 (ref 1.005–1.030)
Urobilinogen, UA: 0.2 mg/dL (ref 0.0–1.0)
pH: 7.5 (ref 5.0–8.0)

## 2013-01-05 LAB — URINE MICROSCOPIC-ADD ON

## 2013-01-05 MED ORDER — ONDANSETRON 4 MG PO TBDP
4.0000 mg | ORAL_TABLET | Freq: Once | ORAL | Status: AC
  Administered 2013-01-05: 4 mg via ORAL
  Filled 2013-01-05: qty 1

## 2013-01-05 MED ORDER — IBUPROFEN 200 MG PO TABS
400.0000 mg | ORAL_TABLET | Freq: Once | ORAL | Status: DC
  Filled 2013-01-05: qty 2

## 2013-01-05 MED ORDER — NAPROXEN 375 MG PO TABS: 375.0000 mg | ORAL_TABLET | Freq: Two times a day (BID) | ORAL | Status: DC

## 2013-01-05 MED ORDER — OXYCODONE-ACETAMINOPHEN 5-325 MG PO TABS
1.0000 | ORAL_TABLET | Freq: Once | ORAL | Status: AC
  Administered 2013-01-05: 1 via ORAL
  Filled 2013-01-05: qty 1

## 2013-01-05 MED ORDER — HYDROCODONE-ACETAMINOPHEN 5-325 MG PO TABS: 1.0000 | ORAL_TABLET | Freq: Four times a day (QID) | ORAL | Status: DC | PRN
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
## 2013-01-06 ENCOUNTER — Ambulatory Visit (INDEPENDENT_AMBULATORY_CARE_PROVIDER_SITE_OTHER): Payer: BC Managed Care – PPO | Admitting: General Surgery

## 2013-01-06 ENCOUNTER — Encounter (INDEPENDENT_AMBULATORY_CARE_PROVIDER_SITE_OTHER): Payer: Self-pay | Admitting: General Surgery

## 2013-01-06 VITALS — BP 102/62 | HR 73 | Temp 97.8°F | Ht 68.0 in | Wt 221.8 lb

## 2013-01-06 DIAGNOSIS — K625 Hemorrhage of anus and rectum: Secondary | ICD-10-CM

## 2013-01-06 LAB — URINE CULTURE: Colony Count: >=100000

## 2013-01-06 NOTE — Patient Instructions (Signed)
We will refer you to Dr Matthias Hughs for further evaluation of your bleeding.

## 2013-01-06 NOTE — Progress Notes (Signed)
Chief Complaint  Patient presents with  . New Evaluation    eval bleeding hems  . Rectal Bleeding  . Rectal Problems    HISTORY: Rebecca Marshall is a 62 y.o. female who presents to the office with rectal bleeding.  Other symptoms include incontinence, abd cramping and diarrhea.  This has been occurring for a couple weeks.  It started with a large episode of melena.  Now she is having BRBPR.  BM's make the symptoms worse.   It is intermittent in nature.  her bowel habits are regular and her bowel movements are loose.  Her fiber intake is moderate.  Her last colonoscopy was in Aug of last year. She also had a sigmoidoscopy Jan of this year.  She reports that both of these were neg.  She denies prolapsing tissue.   She denies fevers or nausea.  She has a remote h/o of ulcer disease.  She does take NSAIDs regularly.  She takes protonix bid and says her GERD is worsening.   Past Medical History  Diagnosis Date  . Atrial dysrhythmia   . Fibromyalgia   . Cancer     thyroid-status post resection on replacement Dr. Talmage Nap  . Anxiety   . Depression   . Thyroid disease   . GE reflux   . Arthritis   . Osteoporosis       Past Surgical History  Procedure Laterality Date  . Cholecystectomy    . Thyroidectomy  04/2009    papillary carcimao Dr. Lazarus Salines  . Total hip arthroplasty  2009 and 2011    L hip   . Tubal ligation    . Abdominal hysterectomy    . Colonoscopy  04/2011  . Toe surgery      bone spur removed - rt great toe        Current Outpatient Prescriptions  Medication Sig Dispense Refill  . calcium-vitamin D (OSCAL WITH D) 500-200 MG-UNIT per tablet Take 1 tablet by mouth every morning.      . diazepam (VALIUM) 10 MG tablet Take 10 mg by mouth at bedtime as needed for anxiety (or for spasms).      . flecainide (TAMBOCOR) 100 MG tablet Take 1 tablet (100 mg total) by mouth 2 (two) times daily.  180 tablet  0  . FLUoxetine (PROZAC) 40 MG capsule Take 40 mg by mouth every morning.       Marland Kitchen ibuprofen (ADVIL,MOTRIN) 200 MG tablet Take 800 mg by mouth 2 (two) times daily as needed for pain.      Marland Kitchen levothyroxine (SYNTHROID, LEVOTHROID) 150 MCG tablet Take 150 mcg by mouth every morning.      . naproxen (NAPROSYN) 375 MG tablet Take 1 tablet (375 mg total) by mouth 2 (two) times daily.  20 tablet  0  . pantoprazole (PROTONIX) 40 MG tablet Take 1 tablet (40 mg total) by mouth 2 (two) times daily.  180 tablet  3  . traZODone (DESYREL) 100 MG tablet Take 200 mg by mouth.       No current facility-administered medications for this visit.      Allergies  Allergen Reactions  . Codeine Nausea And Vomiting  . Fentanyl Nausea And Vomiting      Family History  Problem Relation Age of Onset  . Cancer Other   . Alcohol abuse Mother   . Cancer Mother 25    Lung   . Alcohol abuse Father   . Cancer Sister 68    Breast  History   Social History  . Marital Status: Single    Spouse Name: N/A    Number of Children: N/A  . Years of Education: N/A   Social History Main Topics  . Smoking status: Never Smoker   . Smokeless tobacco: Never Used  . Alcohol Use: Yes     Comment: socially  . Drug Use: Yes  . Sexually Active: None   Other Topics Concern  . None   Social History Narrative  . None      REVIEW OF SYSTEMS - PERTINENT POSITIVES ONLY: Review of Systems - General ROS: negative for - chills, fever or weight loss Hematological and Lymphatic ROS: negative for - bleeding problems, blood clots or bruising Respiratory ROS: no cough, shortness of breath, or wheezing Cardiovascular ROS: no chest pain or dyspnea on exertion Gastrointestinal ROS: positive for - abdominal pain, blood in stools, change in bowel habits, diarrhea and stool incontinence negative for - gas/bloating, heartburn or nausea/vomiting Genito-Urinary ROS: no dysuria, trouble voiding, or hematuria  EXAM: Filed Vitals:   01/06/13 0949  BP: 102/62  Pulse: 73  Temp: 97.8 F (36.6 C)    General  appearance: alert and cooperative Resp: clear to auscultation bilaterally Cardio: regular rate and rhythm GI: soft, non-tender; bowel sounds normal; no masses,  no organomegaly   Procedure: Anoscopy Surgeon: Maisie Fus Diagnosis: rectal bleeding  Assistant: Christella Scheuermann After the risks and benefits were explained, verbal consent was obtained for above procedure  Anesthesia: none Findings: grade 1, non-bleeding int hemorrhoids, small ant anal skin tag, mild perianal irritation    ASSESSMENT AND PLAN: Rebecca Marshall is a 62 y.o. F with BRBPR.  On history and exam these symptoms do not appear to be coming from hemorrhoids.  We will refer her back to Dr Matthias Hughs for continued work up of her GI bleeding.  Her Hgb is 15 right now, so it is likely she is not having massive hemorrhage.     [name], MD Colon and Rectal Surgery / General Surgery [facility]      Visit Diagnoses: 1. Rectal bleeding     Primary Care Physician: Denny Levy, MD

## 2013-01-11 ENCOUNTER — Encounter: Payer: Self-pay | Admitting: Family Medicine

## 2013-01-11 ENCOUNTER — Telehealth: Payer: Self-pay | Admitting: *Deleted
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
## 2013-01-12 ENCOUNTER — Encounter: Payer: Self-pay | Admitting: Family Medicine

## 2013-01-12 NOTE — Telephone Encounter (Signed)
Amy Can u tell her I am mailing her copies of the Ct scans. I doubt seriously she would be eligible for disability but only a psychiatruist could make that determination for sure. THANKS! Denny Levy

## 2013-01-13 NOTE — Telephone Encounter (Signed)
Spoke with pt- gave her message from Dr. Jennette Kettle.

## 2013-02-10 ENCOUNTER — Telehealth: Payer: Self-pay | Admitting: Family Medicine

## 2013-02-10 MED ORDER — ELETRIPTAN HYDROBROMIDE 40 MG PO TABS: ORAL_TABLET | ORAL | Status: DC

## 2013-02-10 NOTE — Telephone Encounter (Signed)
Patient needs her migraine medicine but with her new insurance it needs to be order so that he can afford it.  Sumatriptan 50mg  10 tablets per month to [facility].  This is a change med based on cost from Relpax.

## 2013-02-10 NOTE — Telephone Encounter (Signed)
Will FWD to MD for approval.  Radene Ou, CMA

## 2013-02-17 ENCOUNTER — Telehealth: Payer: Self-pay | Admitting: *Deleted

## 2013-02-17 NOTE — Telephone Encounter (Signed)
Fax from [facility] outpatient pharm requesting to change relpax to generic imitrex due to high cost of copay. Please send new script to pharmacy if this is possible.  Wyatt Haste, RN-BSN

## 2013-02-18 MED ORDER — SUMATRIPTAN SUCCINATE 100 MG PO TABS: ORAL_TABLET | ORAL | Status: DC

## 2013-02-18 NOTE — Addendum Note (Signed)
Addended byDenny Levy L on: 02/18/2013 10:35 AM   Modules accepted: Orders, Medications

## 2013-03-24 ENCOUNTER — Other Ambulatory Visit: Payer: Self-pay | Admitting: Family Medicine

## 2013-03-24 NOTE — Telephone Encounter (Signed)
Pt is calling for request  That the Dr. Jennette Kettle would write a letter stating that because her back problems she would benefit from having the lift to her desk.  Her employer's stated that if she received an letter from  Dr. Jennette Kettle they would allow this and also provider her with this at work. JW

## 2013-03-25 ENCOUNTER — Other Ambulatory Visit: Payer: Self-pay | Admitting: *Deleted

## 2013-03-25 MED ORDER — TRAZODONE HCL 100 MG PO TABS: 200.0000 mg | ORAL_TABLET | Freq: Every day | ORAL | Status: DC

## 2013-03-25 MED ORDER — DIAZEPAM 10 MG PO TABS: 10.0000 mg | ORAL_TABLET | Freq: Every evening | ORAL | Status: DC | PRN

## 2013-03-25 NOTE — Telephone Encounter (Signed)
Dear [place] Asters Team See if you can find out WHAt kind of "lift" she is talking about. I will be out of town until the 14th so it will likely have to wait until then. I need all the specifics she can give me [facility]! Denny Levy

## 2013-03-29 NOTE — Telephone Encounter (Signed)
Pt informed rx called in,she voiced understanding. Edmar Blankenburg, Virgel Bouquet

## 2013-03-30 ENCOUNTER — Other Ambulatory Visit: Payer: Self-pay | Admitting: *Deleted

## 2013-03-31 ENCOUNTER — Other Ambulatory Visit: Payer: Self-pay | Admitting: *Deleted

## 2013-03-31 MED ORDER — FLECAINIDE ACETATE 100 MG PO TABS: 100.0000 mg | ORAL_TABLET | Freq: Two times a day (BID) | ORAL | Status: DC

## 2013-03-31 NOTE — Telephone Encounter (Signed)
Spoke to pharmacist.  She already has a refill authorized from 03/25/13.  Does not need this second refill on the same medication.

## 2013-04-01 ENCOUNTER — Encounter: Payer: Self-pay | Admitting: *Deleted

## 2013-04-01 ENCOUNTER — Other Ambulatory Visit: Payer: Self-pay | Admitting: Family Medicine

## 2013-04-01 ENCOUNTER — Encounter: Payer: Self-pay | Admitting: Family Medicine

## 2013-04-02 ENCOUNTER — Other Ambulatory Visit: Payer: Self-pay | Admitting: Family Medicine

## 2013-04-06 NOTE — Telephone Encounter (Signed)
Dear White Team Please call in THANKS! Antwione Picotte  

## 2013-04-08 ENCOUNTER — Encounter: Payer: Self-pay | Admitting: Family Medicine

## 2013-04-08 NOTE — Telephone Encounter (Signed)
Valium called in for pt per MD order.  Amyrie Illingworth, Darlyne Russian, CMA

## 2013-04-19 ENCOUNTER — Ambulatory Visit: Payer: BC Managed Care – PPO | Admitting: Family Medicine

## 2013-04-23 ENCOUNTER — Ambulatory Visit: Payer: BC Managed Care – PPO | Admitting: Family Medicine

## 2013-04-26 ENCOUNTER — Other Ambulatory Visit (HOSPITAL_COMMUNITY): Payer: Self-pay | Admitting: Endocrinology

## 2013-04-26 DIAGNOSIS — C73 Malignant neoplasm of thyroid gland: Secondary | ICD-10-CM

## 2013-05-05 ENCOUNTER — Encounter: Payer: Self-pay | Admitting: *Deleted

## 2013-05-07 ENCOUNTER — Encounter: Payer: Self-pay | Admitting: Internal Medicine

## 2013-05-07 ENCOUNTER — Ambulatory Visit (INDEPENDENT_AMBULATORY_CARE_PROVIDER_SITE_OTHER): Payer: BC Managed Care – PPO | Admitting: Internal Medicine

## 2013-05-07 ENCOUNTER — Encounter: Payer: Self-pay | Admitting: Cardiology

## 2013-05-07 VITALS — BP 118/80 | HR 89 | Ht 68.0 in | Wt 224.8 lb

## 2013-05-07 DIAGNOSIS — I498 Other specified cardiac arrhythmias: Secondary | ICD-10-CM

## 2013-05-07 DIAGNOSIS — I499 Cardiac arrhythmia, unspecified: Secondary | ICD-10-CM

## 2013-05-07 MED ORDER — FLECAINIDE ACETATE 100 MG PO TABS: 150.0000 mg | ORAL_TABLET | Freq: Two times a day (BID) | ORAL | Status: DC

## 2013-05-07 NOTE — Patient Instructions (Addendum)
Your physician has recommended you make the following change in your medication:    1. Increase Flecainide to 150 mg twice daily.  Your physician has requested that you have an exercise tolerance test. For further information please visit [email or web address] Please also follow instruction sheet, as given. IN ONE WEEK

## 2013-05-07 NOTE — Assessment & Plan Note (Addendum)
This has been symptomatic  She thinks that this is better which he takes an extra dose of flecainide. Her flecainide dose seems to have markedly increased notwithstanding the 100 mg tablet given the fact that a QRS duration for which she 10 ms. We'll increase it from 100--150 reassess QRS duration initially to undergo treadmill testing for pro arrhythmia assessment.  She declined Diagnostic testing  I see no problem with her returning to rehab

## 2013-05-07 NOTE — Progress Notes (Signed)
ELECTROPHYSIOLOGY CONSULT NOTE  Patient ID: Rebecca Marshall, MRN: [id number], DOB/AGE: 1951-06-01 62 y.o. Admit date: (Not on file) Date of Consult: 05/07/2013  Primary Physician: Denny Levy, MD Primary Cardiologist: SK  Chief Complaint: palpitations   HPI Rebecca Marshall is a 62 y.o. female  Seen more than 3 years ago for palpitations associated with atrial tachycardia, hypotension and syncope orthopnea triggered by the arrhythmia. She was treated with flecainide this was largely resolving of her symptoms. Over the last months she has had recurrent problems with palpitations. They're associated with some lightheadedness but there has been no recurrent syncope. She would occasionally experimented by taking an extra half dose of flecainide i.e. 150 mg. On both of these occasions she had no arrhythmia that day. Her arrhythmias have been daily.  Cardiac evaluation the past 2008 included an echo that was normal in 2011 a Myoview scan that was also normal.  She denies exercise intolerance. She does have shortness of breath associated with these palpitations. There is no chest discomfort. She has not had nocturnal dyspnea orthopnea or peripheral edema  Her history is also notable for chronic depression.  She recently had a problem with fecal incontinence which is treated to the birth of her twins 64 some years ago. She also had rectal bleeding attributed to hemorrhoids which have been treated ongoing.     Past Medical History  Diagnosis Date  . Atrial dysrhythmia   . Fibromyalgia   . Cancer     thyroid-status post resection on replacement Dr. Talmage Nap  . Anxiety   . Depression   . Thyroid disease   . GE reflux   . Arthritis   . Osteoporosis       Surgical History:  Past Surgical History  Procedure Laterality Date  . Cholecystectomy    . Thyroidectomy  04/2009    papillary carcimao Dr. Lazarus Salines  . Total hip arthroplasty  2009 and 2011    L hip   . Tubal ligation    . Abdominal  hysterectomy    . Colonoscopy  04/2011  . Toe surgery      bone spur removed - rt great toe     Home Meds: Prior to Admission medications   Medication Sig Start Date End Date Taking? Authorizing Provider  calcium-vitamin D (OSCAL WITH D) 500-200 MG-UNIT per tablet Take 1 tablet by mouth every morning.   Yes Historical Provider, MD  diazepam (VALIUM) 10 MG tablet TAKE 1 TABLET BY MOUTH EVERY NIGHT AT BEDTIME AS NEEDED FOR ANXIETY 04/01/13  Yes [name], MD  flecainide (TAMBOCOR) 100 MG tablet Take 1 tablet (100 mg total) by mouth 2 (two) times daily. 03/31/13  Yes Sanjuana Letters, MD  FLUoxetine (PROZAC) 40 MG capsule Take 40 mg by mouth every morning. 12/14/12  Yes [name], MD  levothyroxine (SYNTHROID, LEVOTHROID) 150 MCG tablet Take 150 mcg by mouth every morning. 12/28/12  Yes Macy Mis, MD  pantoprazole (PROTONIX) 40 MG tablet Take 1 tablet (40 mg total) by mouth 2 (two) times daily. 12/14/12  Yes [name], MD  SUMAtriptan (IMITREX) 100 MG tablet Take one by mouth at beginning of headache and may repeat ONCE in two hours if needed with a max of two tabs in 24 hours 02/18/13  Yes [name], MD  traZODone (DESYREL) 100 MG tablet TAKE 2 TABLETS BY MOUTH AT BEDTIME. 03/24/13  Yes [name], MD  ibuprofen (ADVIL,MOTRIN) 200 MG tablet Take 800 mg  by mouth 2 (two) times daily as needed for pain.    Historical Provider, MD  naproxen (NAPROSYN) 375 MG tablet Take 1 tablet (375 mg total) by mouth 2 (two) times daily. 01/05/13   Jaci Carrel, PA-C      Allergies:  Allergies  Allergen Reactions  . Codeine Nausea And Vomiting  . Fentanyl Nausea And Vomiting    History   Social History  . Marital Status: Single    Spouse Name: N/A    Number of Children: N/A  . Years of Education: N/A   Occupational History  . Not on file.   Social History Main Topics  . Smoking status: Never Smoker   . Smokeless tobacco: Never Used  . Alcohol Use: Yes     Comment: socially  . Drug Use:  Yes  . Sexual Activity: Not on file   Other Topics Concern  . Not on file   Social History Narrative  . No narrative on file     Family History  Problem Relation Age of Onset  . Cancer Other   . Alcohol abuse Mother   . Cancer Mother 48    Lung   . Alcohol abuse Father   . Cancer Sister 102    Breast     ROS:  Please see the history of present illness.     All other systems reviewed and negative.    Physical Exam: Blood pressure 118/80, pulse 89, height 5\' 8"  (1.727 m), weight 224 lb 12.8 oz (101.969 kg). General: Well developed, well nourished female in no acute distress. Head: Normocephalic, atraumatic, sclera non-icteric, no xanthomas, nares are without discharge. EENT: normal Lymph Nodes:  none Back: without scoliosis/kyphosis, no CVA tendersness Neck: Negative for carotid bruits. JVD not elevated. Lungs: Clear bilaterally to auscultation without wheezes, rales, or rhonchi. Breathing is unlabored. Heart: RRR with S1 S2. Novsystolic murmur , rubs, or gallops appreciated. Abdomen: Soft, non-tender, non-distended with normoactive bowel sounds. No hepatomegaly. No rebound/guarding. No obvious abdominal masses. Msk:  Strength and tone appear normal for age. Extremities: No clubbing or cyanosis no edema.  Distal pedal pulses are 2+ and equal bilaterally. Skin: Warm and Dry Neuro: Alert and oriented X 3. CN III-XII intact Grossly normal sensory and motor function . Psych:  Responds to questions appropriately with a normal affect.      Labs: Cardiac Enzymes No results found for this basename: CKTOTAL, CKMB, TROPONINI,  in the last 72 hours CBC Lab Results  Component Value Date   WBC 4.4 06/05/2011   HGB 15.0 01/05/2013   HCT 44.0 01/05/2013   MCV 92.9 06/05/2011   PLT 247 06/05/2011   PROTIME: No results found for this basename: LABPROT, INR,  in the last 72 hours Chemistry No results found for this basename: NA, K, CL, CO2, BUN, CREATININE, CALCIUM, LABALBU, PROT,  BILITOT, ALKPHOS, ALT, AST, GLUCOSE,  in the last 168 hours Lipids No results found for this basename: CHOL, HDL, LDLCALC, TRIG   BNP No results found for this basename: probnp   Miscellaneous No results found for this basename: DDIMER    Radiology/Studies:  No results found.  EKG:  Sinus rhythm at 89 Intervals 19/10/38 Axis I 4-19  Assessment and Plan:   Sherryl Manges

## 2013-05-11 ENCOUNTER — Encounter: Payer: BC Managed Care – PPO | Admitting: Internal Medicine

## 2013-05-12 ENCOUNTER — Encounter: Payer: Self-pay | Admitting: Physician Assistant

## 2013-05-12 ENCOUNTER — Ambulatory Visit (HOSPITAL_COMMUNITY)
Admission: RE | Admit: 2013-05-12 | Discharge: 2013-05-12 | Disposition: A | Discharge status: Home / Self Care | Payer: BC Managed Care – PPO | Source: Ambulatory Visit | Attending: Internal Medicine | Admitting: Internal Medicine

## 2013-05-12 DIAGNOSIS — I499 Cardiac arrhythmia, unspecified: Secondary | ICD-10-CM

## 2013-05-12 DIAGNOSIS — I498 Other specified cardiac arrhythmias: Secondary | ICD-10-CM | POA: Insufficient documentation

## 2013-05-12 NOTE — Progress Notes (Signed)
Pt here today for ETT to r/o arrhythmia while on flecainide.  Pre Test: EKG NSR TWI III, T wave flattening V2, QTc 436. VSS. No complaints. During Test: Early in test, pt c/o transient fluttering sensation in chest - consistently sinus tach low 100's at that time. Exercised to 6:40 and reached target HR 136 transiently, then test terminated at pt request due to dyspnea. Also had chest tightness at very end of test. No arrhythmia or ectopy noted. No acute EKG changes. BP elevated as expected in response to exercise. Post Test: Chest tightness quickly resolved. Dyspnea resolved. EKG showed NSR with slightly more rounded/pronounced TW throughout but no significant acute ST-T changes. QTc 461. VSS. Discussed tracings with Dr. Graciela Husbands who felt this was a benign test. Pt instructed to f/u 3 months.  Dayna Dunn PA-C

## 2013-05-13 ENCOUNTER — Encounter (HOSPITAL_COMMUNITY): Payer: BC Managed Care – PPO

## 2013-05-18 ENCOUNTER — Encounter: Payer: Self-pay | Admitting: Internal Medicine

## 2013-05-31 ENCOUNTER — Encounter (HOSPITAL_COMMUNITY)
Admission: RE | Admit: 2013-05-31 | Discharge: 2013-05-31 | Disposition: A | Discharge status: Home / Self Care | Payer: BC Managed Care – PPO | Source: Ambulatory Visit | Attending: Endocrinology | Admitting: Endocrinology

## 2013-05-31 DIAGNOSIS — Z9889 Other specified postprocedural states: Secondary | ICD-10-CM | POA: Insufficient documentation

## 2013-05-31 DIAGNOSIS — C73 Malignant neoplasm of thyroid gland: Secondary | ICD-10-CM | POA: Insufficient documentation

## 2013-05-31 MED ORDER — THYROTROPIN ALFA 1.1 MG IM SOLR
0.9000 mg | INTRAMUSCULAR | Status: AC
  Administered 2013-05-31 – 2013-06-01 (×2): 0.9 mg via INTRAMUSCULAR

## 2013-06-01 ENCOUNTER — Encounter (HOSPITAL_COMMUNITY)
Admission: RE | Admit: 2013-06-01 | Discharge: 2013-06-01 | Disposition: A | Discharge status: Home / Self Care | Payer: BC Managed Care – PPO | Source: Ambulatory Visit | Attending: Endocrinology | Admitting: Endocrinology

## 2013-06-01 MED ORDER — THYROTROPIN ALFA 1.1 MG IM SOLR: 0.9000 mg | INTRAMUSCULAR | Status: DC

## 2013-06-02 ENCOUNTER — Encounter (HOSPITAL_COMMUNITY)
Admission: RE | Admit: 2013-06-02 | Discharge: 2013-06-02 | Disposition: A | Discharge status: Home / Self Care | Payer: BC Managed Care – PPO | Source: Ambulatory Visit | Attending: Endocrinology | Admitting: Endocrinology

## 2013-06-02 MED ORDER — SODIUM IODIDE I 131 CAPSULE
4.0000 | Freq: Once | INTRAVENOUS | Status: AC | PRN
  Administered 2013-06-02: 4 via ORAL

## 2013-06-03 ENCOUNTER — Encounter: Payer: Self-pay | Admitting: Internal Medicine

## 2013-06-04 ENCOUNTER — Encounter (HOSPITAL_COMMUNITY)
Admission: RE | Admit: 2013-06-04 | Discharge: 2013-06-04 | Disposition: A | Discharge status: Home / Self Care | Payer: BC Managed Care – PPO | Source: Ambulatory Visit | Attending: Endocrinology | Admitting: Endocrinology

## 2013-06-07 ENCOUNTER — Telehealth: Payer: Self-pay | Admitting: *Deleted

## 2013-06-07 NOTE — Telephone Encounter (Signed)
Spoke with patient about stress test results she requested. Faxed results per her request.

## 2013-06-15 ENCOUNTER — Encounter: Payer: Self-pay | Admitting: Family Medicine

## 2013-06-16 ENCOUNTER — Ambulatory Visit (INDEPENDENT_AMBULATORY_CARE_PROVIDER_SITE_OTHER): Payer: BC Managed Care – PPO | Admitting: Family Medicine

## 2013-06-16 ENCOUNTER — Encounter: Payer: Self-pay | Admitting: Family Medicine

## 2013-06-16 VITALS — BP 120/74 | HR 86 | Temp 98.8°F | Ht 68.0 in | Wt 220.0 lb

## 2013-06-16 DIAGNOSIS — M25561 Pain in right knee: Secondary | ICD-10-CM

## 2013-06-16 DIAGNOSIS — F341 Dysthymic disorder: Secondary | ICD-10-CM

## 2013-06-16 DIAGNOSIS — M25569 Pain in unspecified knee: Secondary | ICD-10-CM

## 2013-06-16 DIAGNOSIS — K219 Gastro-esophageal reflux disease without esophagitis: Secondary | ICD-10-CM

## 2013-06-16 DIAGNOSIS — C73 Malignant neoplasm of thyroid gland: Secondary | ICD-10-CM

## 2013-06-16 MED ORDER — DIVALPROEX SODIUM 125 MG PO DR TAB: DELAYED_RELEASE_TABLET | ORAL | Status: DC

## 2013-06-16 MED ORDER — BENZONATATE 200 MG PO CAPS: 200.0000 mg | ORAL_CAPSULE | Freq: Two times a day (BID) | ORAL | Status: DC | PRN

## 2013-06-16 MED ORDER — METHYLPREDNISOLONE ACETATE 40 MG/ML IJ SUSP
40.0000 mg | Freq: Once | INTRAMUSCULAR | Status: AC
  Administered 2013-06-16: 40 mg via INTRAMUSCULAR

## 2013-06-16 NOTE — Progress Notes (Signed)
  Subjective:    Patient ID: Rebecca Marshall, female    DOB: [birth year], 62 y.o.   MRN: [id number]  HPI #1. Followup depression. Is having cycles of increased symptoms. These occur for 2-4 weeks and they occur about every 2 months. During this time she can have suicidal ideation, significantly decreased energy. She has been able to continue doing her ADLs and performing her work but is questionable. After the episode has passed she's back to fairly functional life he feels great. She denies any episodes of extremely good mood, no standing sprays, no while behavior or decisions that she regrets. #2. Right knee pain. Previously had a corticosteroid injection by the orthopedist about 3 or 4 months ago and that helped. Like to pursue once a day. #3. Small lesion on her right hip that is being irritated by her pain she long period she's previously been told it was a neurofibroma. She would like it removed. #4. Cough: Chronic cough that is nonproductive. Feels like she clears her throat a lot. Also has cough spells when she is around perfumes, inks et Karie Soda.  Recently I had her endoscopy. Her mammogram is due next month. Saw her endocrinologist for her followup of thyroid cancer, she is now 4 years out in her skin was clean.   Review of Systems  Constitutional: Negative for activity change, appetite change and unexpected weight change.  HENT: Positive for postnasal drip. Negative for sore throat.   Eyes: Negative for pain, itching and visual disturbance.  Respiratory: Positive for cough. Negative for choking, chest tightness, shortness of breath and wheezing.   Cardiovascular: Negative for chest pain.  Gastrointestinal: Positive for blood in stool. Negative for abdominal pain.  Endocrine: Negative for polydipsia, polyphagia and polyuria.  Genitourinary: Negative for dysuria and vaginal bleeding.  Musculoskeletal: Positive for arthralgias.  Psychiatric/Behavioral: Positive for suicidal ideas and  dysphoric mood. Negative for hallucinations, confusion, self-injury and decreased concentration. The patient is not nervous/anxious and is not hyperactive.        Objective:   Physical Exam  Constitutional: She is oriented to person, place, and time. She appears well-developed.  HENT:  Nose: Nose normal.  Mouth/Throat: Oropharynx is clear and moist.  Eyes: Conjunctivae and EOM are normal.  Neck: Normal range of motion. Neck supple. No thyromegaly present.  Cardiovascular: Normal heart sounds.   Pulmonary/Chest: Effort normal.  Lymphadenopathy:    She has no cervical adenopathy.  Neurological: She is alert and oriented to person, place, and time.  Psychiatric: She has a normal mood and affect. Her behavior is normal. Judgment and thought content normal.          Assessment & Plan:

## 2013-06-17 NOTE — Assessment & Plan Note (Signed)
Recent negative scan by her endocrinologist. She is now 4 years out to be cancer free.

## 2013-06-17 NOTE — Assessment & Plan Note (Signed)
Sounds like she's having some cyclic depressive it episodes. We will admit stabilizer Depakote and see her back in 4-6 weeks. We'll start her out at one tab increase her to 2 she tolerates it. She's also going to keep track of her moods on a calendar. We may have to tinker a bit to get the exact dose right. Continue fluoxetine.

## 2013-06-17 NOTE — Assessment & Plan Note (Signed)
Unclear for cough is from breakthrough GERD. We'll continue her PPI. Fall to cough up in a month after we try some treatment with cough suppressants.

## 2013-07-01 ENCOUNTER — Ambulatory Visit (INDEPENDENT_AMBULATORY_CARE_PROVIDER_SITE_OTHER): Payer: BC Managed Care – PPO | Admitting: Family Medicine

## 2013-07-01 VITALS — BP 113/67 | HR 75 | Temp 99.2°F | Ht 68.0 in | Wt 220.0 lb

## 2013-07-01 DIAGNOSIS — F341 Dysthymic disorder: Secondary | ICD-10-CM

## 2013-07-01 DIAGNOSIS — L909 Atrophic disorder of skin, unspecified: Secondary | ICD-10-CM

## 2013-07-01 DIAGNOSIS — L089 Local infection of the skin and subcutaneous tissue, unspecified: Secondary | ICD-10-CM

## 2013-07-01 DIAGNOSIS — L918 Other hypertrophic disorders of the skin: Secondary | ICD-10-CM

## 2013-07-01 MED ORDER — CLINDAMYCIN HCL 150 MG PO CAPS: ORAL_CAPSULE | ORAL | Status: DC

## 2013-07-01 NOTE — Addendum Note (Signed)
Addended byDenny Levy L on: 07/01/2013 04:50 PM   Modules accepted: Orders

## 2013-07-01 NOTE — Progress Notes (Signed)
  Subjective:    Patient ID: Rebecca Marshall, female    DOB: 29-Jul-1951, 62 y.o.   MRN: [id number]  HPI  Presents for removal of mass from her lateral right thigh. This is right adjacent to the area where her underwear elastic rubs and is becoming quite irritating. The lesion has been there many years but it has gotten a little bit bigger in the last year or so. It is similar to multiple other lesions she has had. She has had one very similar one from her arm removed by dermatologist and it was, by her report, a neurofibroma. This lesion has never bled or been sore other than when it's being rubbed by clothing. She has otherwise been well.  #2. Followup starting Depakote for mood stabilizing. She is still at the one pill a day dose and feels it is doing adnexal job. She has not felt depressed into the deep Slight she was experiencing and the only side effect she has noticed is a headache upon awakening most of the days of the last week. She's not sure if this is related to the Depakote they did seem to start about the same time. Headache is diffuse, generalized, no visual changes no nausea vomiting, no aura. It goes away while she's drinking her coffee. Notably when she was on vacation last week she drinks more coffee in the morning and did not have as much headache.  Review of Systems No fever. Headache per history of present illness.    Objective:   Physical Exam Vital signs are reviewed GENERAL: Well-developed female no acute distress PSYCHIATRIC/neuro: Alert and oriented x4. No gross focal neurological deficit. Affect is interactive. Normal conversation. SKIN: Right anterior lateral thigh there is a pedunculated mass that is moderately firm, squeezable, flesh-colored, nontender. It is 0.6 cm in diameter. The stalk is about 4 mm in diameter.  PROCEDURE note: Patient given informed consent for removal of the lesion. Signed copies in the chart. Area prepped and draped in usual sterile fashion.  2 cc of 1% lidocaine with epinephrine used for local anesthesia. After anesthesia was obtained, the mass was excised at the base. A small amount of undermining of the remaining... was done and then the incision was closed with 4 interrupted sutures of 4-0 nylon. Minimal bleeding. Antibiotic ointment and Band-Aids applied. Patient heart procedure well. No complications. Post procedure instructions given. 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 She has not cell phone number she needs to call for any worrisome signs. I gave her a suture removal kit. This is most likely an acrochordon. She thinks it's a neurofibroma. Either way it looks like a totally benign lesion she elects not to have pathology done on the material and I agree with that the       Assessment & Plan:  #1. Removal skin lesion: Acrochordon #2. Followup starting Depakote for mood stabilization: Doing quite well. His she's eating symptom relief with the one pill a day dose I would state that. If she starts having a little bit more lability she'll increase it to 2 pills a day. She'll follow the headache if it continues or she has other worsening symptoms she'll let us know.

## 2013-07-29 ENCOUNTER — Other Ambulatory Visit: Payer: Self-pay

## 2013-08-12 ENCOUNTER — Other Ambulatory Visit: Payer: Self-pay

## 2013-08-12 DIAGNOSIS — Z1231 Encounter for screening mammogram for malignant neoplasm of breast: Secondary | ICD-10-CM

## 2013-08-31 ENCOUNTER — Ambulatory Visit: Payer: BC Managed Care – PPO | Admitting: Cardiology

## 2013-09-17 ENCOUNTER — Ambulatory Visit: Payer: BC Managed Care – PPO

## 2013-09-19 ENCOUNTER — Other Ambulatory Visit: Payer: Self-pay | Admitting: Family Medicine

## 2013-09-28 ENCOUNTER — Other Ambulatory Visit: Payer: Self-pay | Admitting: Family Medicine

## 2013-09-28 MED ORDER — TRAZODONE HCL 100 MG PO TABS: ORAL_TABLET | ORAL | Status: DC

## 2013-10-11 ENCOUNTER — Ambulatory Visit
Admission: RE | Admit: 2013-10-11 | Discharge: 2013-10-11 | Disposition: A | Discharge status: Home / Self Care | Payer: BC Managed Care – PPO | Source: Ambulatory Visit

## 2013-10-11 ENCOUNTER — Telehealth: Payer: Self-pay | Admitting: Family Medicine

## 2013-10-11 DIAGNOSIS — Z1231 Encounter for screening mammogram for malignant neoplasm of breast: Secondary | ICD-10-CM

## 2013-10-11 NOTE — Telephone Encounter (Signed)
Pt called and would like a prescription of Tamiflu. jw

## 2013-10-12 MED ORDER — OSELTAMIVIR PHOSPHATE 75 MG PO CAPS: 75.0000 mg | ORAL_CAPSULE | Freq: Two times a day (BID) | ORAL | Status: DC

## 2013-10-13 ENCOUNTER — Telehealth: Payer: Self-pay | Admitting: Family Medicine

## 2013-10-13 NOTE — Telephone Encounter (Signed)
Patient was informed. Rebecca Marshall, Rebecca Marshall

## 2013-10-13 NOTE — Telephone Encounter (Signed)
Dear [name] Team plz call her and tell her rx was called in [facility]! Dorcas Mcmurray

## 2013-11-30 ENCOUNTER — Encounter: Payer: Self-pay | Admitting: Cardiology

## 2013-11-30 ENCOUNTER — Ambulatory Visit (INDEPENDENT_AMBULATORY_CARE_PROVIDER_SITE_OTHER): Payer: BC Managed Care – PPO | Admitting: Cardiology

## 2013-11-30 VITALS — BP 118/78 | HR 58 | Ht 68.0 in | Wt 210.0 lb

## 2013-11-30 DIAGNOSIS — I498 Other specified cardiac arrhythmias: Secondary | ICD-10-CM

## 2013-11-30 DIAGNOSIS — I471 Supraventricular tachycardia: Secondary | ICD-10-CM

## 2013-11-30 DIAGNOSIS — R002 Palpitations: Secondary | ICD-10-CM

## 2013-11-30 LAB — CBC WITH DIFFERENTIAL/PLATELET
Basophils Absolute: 0 10*3/uL (ref 0.0–0.1)
Basophils Relative: 0.8 % (ref 0.0–3.0)
Eosinophils Absolute: 0.1 10*3/uL (ref 0.0–0.7)
Eosinophils Relative: 2.1 % (ref 0.0–5.0)
HCT: 39.8 % (ref 36.0–46.0)
Hemoglobin: 13.3 g/dL (ref 12.0–15.0)
Lymphocytes Relative: 19.5 % (ref 12.0–46.0)
Lymphs Abs: 1.1 10*3/uL (ref 0.7–4.0)
MCHC: 33.5 g/dL (ref 30.0–36.0)
MCV: 91.4 fl (ref 78.0–100.0)
Monocytes Absolute: 0.6 10*3/uL (ref 0.1–1.0)
Monocytes Relative: 10.4 % (ref 3.0–12.0)
Neutro Abs: 3.9 10*3/uL (ref 1.4–7.7)
Neutrophils Relative %: 67.2 % (ref 43.0–77.0)
Platelets: 208 10*3/uL (ref 150.0–400.0)
RBC: 4.35 Mil/uL (ref 3.87–5.11)
RDW: 14.5 % (ref 11.5–14.6)
WBC: 5.9 10*3/uL (ref 4.5–10.5)

## 2013-11-30 LAB — BASIC METABOLIC PANEL
BUN: 19 mg/dL (ref 6–23)
CO2: 31 mEq/L (ref 19–32)
Calcium: 8.8 mg/dL (ref 8.4–10.5)
Chloride: 103 mEq/L (ref 96–112)
Creatinine, Ser: 0.7 mg/dL (ref 0.4–1.2)
GFR: 86.06 mL/min (ref >60.00)
Glucose, Bld: 91 mg/dL (ref 70–99)
Potassium: 4.1 mEq/L (ref 3.5–5.1)
Sodium: 141 mEq/L (ref 135–145)

## 2013-11-30 LAB — MAGNESIUM: Magnesium: 2.1 mg/dL (ref 1.5–2.5)

## 2013-11-30 LAB — TSH: TSH: 1.22 u[IU]/mL (ref 0.35–5.50)

## 2013-11-30 NOTE — Patient Instructions (Signed)
Your physician recommends that you have lab work today :CBC,BMET,MAGNESIUM, TSH

## 2013-11-30 NOTE — Progress Notes (Signed)
Patient ID: Rebecca Marshall MRN: [id number], DOB/AGE: 04/08/1951   Date of Visit: 11/30/2013  Primary Physician: Dorcas Mcmurray, MD Primary EP: Jolyn Nap, MD Reason for Visit: EP follow-up  History of Present Illness  Rebecca Marshall is a 63 y.o. female with paroxysmal atrial tachycardia who presents today for routine electrophysiology followup. Since last being seen in our clinic, she reports she was doing well on the increased dose of flecainide (150 mg twice daily Aug 2014) until about 6-8 weeks ago. She started having recurrent tachy-palpitations with dizziness. Similar to previous with atrial tachycardia so she recognized them right away. No CP or SOB. No syncope. Occurring 1-2 times weekly (previously occurring daily). She has no other complaints. She has chronic intermittent LE swelling but denies worsening from baseline. No orthopnea or PND. She is compliant with medications.  Past Medical History Past Medical History  Diagnosis Date  . Atrial dysrhythmia   . Fibromyalgia   . Cancer     thyroid-status post resection on replacement Dr. Chalmers Cater  . Anxiety   . Depression   . Thyroid disease   . GE reflux   . Arthritis   . Osteoporosis     Past Surgical History Past Surgical History  Procedure Laterality Date  . Cholecystectomy    . Thyroidectomy  04/2009    papillary carcimao Dr. Erik Obey  . Total hip arthroplasty  2009 and 2011    L hip   . Tubal ligation    . Abdominal hysterectomy    . Colonoscopy  04/2011  . Toe surgery      bone spur removed - rt great toe    Allergies/Intolerances Allergies  Allergen Reactions  . Codeine Nausea And Vomiting  . Fentanyl Nausea And Vomiting    Current Home Medications Current Outpatient Prescriptions  Medication Sig Dispense Refill  . calcium-vitamin D (OSCAL WITH D) 500-200 MG-UNIT per tablet Take 1 tablet by mouth every morning.      . clindamycin (CLEOCIN) 150 MG capsule Take 2 by mouth capsules on day of procedure prn       . diazepam (VALIUM) 10 MG tablet TAKE 1 TABLET BY MOUTH EVERY NIGHT AT BEDTIME AS NEEDED FOR ANXIETY  30 tablet  4  . divalproex (DEPAKOTE) 125 MG DR tablet TAKE 1 TABLET BY MOUTH AT BEDTIME AND TAPER UP TO 2 TABLETS AT BEDTIME  60 tablet  1  . flecainide (TAMBOCOR) 100 MG tablet Take 1.5 tablets (150 mg total) by mouth 2 (two) times daily.  180 tablet  3  . FLUoxetine (PROZAC) 40 MG capsule Take 40 mg by mouth every morning.      Marland Kitchen levothyroxine (SYNTHROID, LEVOTHROID) 150 MCG tablet TAKE 1 TABLET BY MOUTH DAILY.  90 tablet  PRN  . pantoprazole (PROTONIX) 40 MG tablet Take 1 tablet (40 mg total) by mouth 2 (two) times daily.  180 tablet  3  . traZODone (DESYREL) 100 MG tablet TAKE 2 TABLETS BY MOUTH AT BEDTIME.  180 tablet  3   No current facility-administered medications for this visit.    Social History History   Social History  . Marital Status: Single    Spouse Name: N/A    Number of Children: N/A  . Years of Education: N/A   Occupational History  . Not on file.   Social History Main Topics  . Smoking status: Never Smoker   . Smokeless tobacco: Never Used  . Alcohol Use: Yes     Comment: socially  .  Drug Use: Yes  . Sexual Activity: Not on file   Other Topics Concern  . Not on file   Social History Narrative  . No narrative on file     Review of Systems General: No chills, fever, night sweats or weight changes Cardiovascular: No chest pain, dyspnea on exertion, edema, orthopnea, palpitations, paroxysmal nocturnal dyspnea Dermatological: No rash, lesions or masses Respiratory: No cough, dyspnea Urologic: No hematuria, dysuria Abdominal: No nausea, vomiting, diarrhea, bright red blood per rectum, melena, or hematemesis Neurologic: No visual changes, weakness, changes in mental status All other systems reviewed and are otherwise negative except as noted above.  Physical Exam Vitals: Blood pressure 118/78, pulse 65, height 5\' 8"  (1.727 m), weight 210 lb (95.255  kg).  General: Well developed, well appearing 63 y.o. female in no acute distress. HEENT: Normocephalic, atraumatic. EOMs intact. Sclera nonicteric. Oropharynx clear.  Neck: Supple. No JVD. Lungs: Respirations regular and unlabored, CTA bilaterally. No wheezes, rales or rhonchi. Heart: RRR. S1, S2 present. No murmurs, rub, S3 or S4. Abdomen: Soft, non-distended.  Extremities: No clubbing, cyanosis or edema. PT/Radials 2+ and equal bilaterally. Psych: Normal affect. Neuro: Alert and oriented X 3. Moves all extremities spontaneously.   Diagnostics 12-lead ECG today - sinus bradycardia at 58 bpm with normal intervals; PR 196, QRS 102 (previously 98), QTc 445  Assessment and Plan 1. Paroxysmal atrial tachycardia - palpitations have returned but are less frequent and Ms. [name] feels it may be related to increased stressors; she is compliant with medications - will check CBC, BMET, Mg and TSH today - discussed with Dr. Caryl Comes in clinic today and will continue current flecainide dose and follow-up in 2 weeks  Signed, Adra Shepler, PA-C 11/30/2013, 9:13 AM

## 2013-12-01 ENCOUNTER — Telehealth: Payer: Self-pay | Admitting: Family Medicine

## 2013-12-01 ENCOUNTER — Telehealth: Payer: Self-pay | Admitting: *Deleted

## 2013-12-01 NOTE — Telephone Encounter (Signed)
She would like for you to call her at work when you get a moment.  [phone].

## 2013-12-01 NOTE — Telephone Encounter (Signed)
called pt to give lab results. pt showed understanding. Per pt she is feeling better and would like to cancel apopintment (12-21-13). Will call back if she gets her symptoms back.

## 2013-12-06 ENCOUNTER — Ambulatory Visit (INDEPENDENT_AMBULATORY_CARE_PROVIDER_SITE_OTHER): Payer: BC Managed Care – PPO | Admitting: Family Medicine

## 2013-12-06 ENCOUNTER — Encounter: Payer: Self-pay | Admitting: Family Medicine

## 2013-12-06 VITALS — BP 110/75 | Ht 68.0 in | Wt 205.0 lb

## 2013-12-06 DIAGNOSIS — C73 Malignant neoplasm of thyroid gland: Secondary | ICD-10-CM

## 2013-12-06 DIAGNOSIS — M5126 Other intervertebral disc displacement, lumbar region: Secondary | ICD-10-CM

## 2013-12-06 DIAGNOSIS — M87059 Idiopathic aseptic necrosis of unspecified femur: Secondary | ICD-10-CM

## 2013-12-06 DIAGNOSIS — F341 Dysthymic disorder: Secondary | ICD-10-CM

## 2013-12-09 ENCOUNTER — Ambulatory Visit: Payer: BC Managed Care – PPO

## 2013-12-10 DIAGNOSIS — M5126 Other intervertebral disc displacement, lumbar region: Secondary | ICD-10-CM

## 2013-12-10 HISTORY — DX: Other intervertebral disc displacement, lumbar region: M51.26

## 2013-12-10 NOTE — Assessment & Plan Note (Signed)
So far she is doing really well. She continues followed endocrinology regarding her thyroid replacement. Asymptomatic from that standpoint.

## 2013-12-10 NOTE — Progress Notes (Signed)
   Subjective:    Patient ID: Rebecca Marshall, female    DOB: October 23, 1950, 63 y.o.   MRN: [id number]  HPI Low back pain. She was found to have a herniated disc and neurosurgery did procedure on her which sounds like either a microdiscectomy oral laminectomy in December, 2014. He was evidently done at their own surgical Center and there are no notes available in [facility]. Since then she's had some relief of her pain is radiating down her leg but she still has diffuse pain in the low back itself. They have start her on gabapentin, it made her feel fatigued so she is trying to taper off that. She has questions about other options for pain management of her back.  Followup other medical issues which are mostly stable. She sees the endocrinologist once or twice a year for followup of her hyperthyroidism status post resection for thyroid cancer. She has been followed by the orthopedist for her hip replacement with revision and that seems to be going well currently.  #3. Depression. She feels a little frustrated in that she continues to be lacking in energy and excitement about life. She's not having any suicidal or homicidal ideation. Just does not feel like she has the Zestril life that she used to. She is continued on the current medications without problem she is thinking about getting back into behavioral therapy wonders if I have some recommendations. She continues to use the trazodone at night for insomnia which worked well for her. #4. GERD well managed on her current medication of Protonix.  #5. Sick sinus syndrome managed and followed by cardiology. She recently saw them and says her report was good.  #6. Recently got braces. She's not sure she's glad she did that but so far not having a lot of problems with that.  Social history: She still likes her job quite well.  Review of Systems See history of present illness above. Additionally per review of systems reveals no unusual weight change, no  chest pain, no shortness of breath with exertion that is different from her baseline. No abnormal pain, no bowel changes, no urinary frequency or burning. No dizziness.    Objective:   Physical Exam  Vital signs reviewed. GENERAL: Well-developed, well-nourished, no acute distress. CARDIOVASCULAR: Regular rate and rhythm no murmur gallop or rub LUNGS: Clear to auscultation bilaterally, no rales or wheeze. ABDOMEN: Soft positive bowel sounds NEURO: No gross focal neurological deficits. MSK: Movement of extremity x 4. BACK: Nontender to palpation. She has somewhat limited flexion hip secondary to low back muscle tightness as well as hamstring tightness. Hyperextension is mildly painful diffusely in the low back in the area of tenderness is mostly over the PSIS area the vertebra are nontender to percussion. There is no defect. Straight leg raise negative bilaterally. Lower strandy strength is 5 out of 5 hip flexor.        Assessment & Plan:

## 2013-12-10 NOTE — Assessment & Plan Note (Signed)
On discussion. They'll continue her current medication regimen. I definitely agree with her that some therapy might be a good idea I gave her the name of a couple of counselors. To followup with me when necessary regarding a sore for symptoms worsen in anyway.

## 2013-12-10 NOTE — Assessment & Plan Note (Signed)
Her hip revision seems to be doing extremely well.

## 2013-12-10 NOTE — Assessment & Plan Note (Signed)
Not exactly sure what surgery they did. He would be nice to get those notes. For now a letter to continue to followup with them regarding her taper down on gabapentin.

## 2013-12-13 ENCOUNTER — Other Ambulatory Visit: Payer: Self-pay | Admitting: *Deleted

## 2013-12-15 MED ORDER — FLUOXETINE HCL 40 MG PO CAPS: 40.0000 mg | ORAL_CAPSULE | Freq: Every morning | ORAL | Status: DC

## 2013-12-21 ENCOUNTER — Ambulatory Visit: Payer: BC Managed Care – PPO | Admitting: Cardiology

## 2013-12-29 ENCOUNTER — Encounter: Payer: Self-pay | Admitting: Family Medicine

## 2014-01-12 ENCOUNTER — Other Ambulatory Visit: Payer: Self-pay | Admitting: Family Medicine

## 2014-02-03 ENCOUNTER — Other Ambulatory Visit: Payer: Self-pay

## 2014-02-03 ENCOUNTER — Other Ambulatory Visit: Payer: Self-pay | Admitting: *Deleted

## 2014-02-03 MED ORDER — FLECAINIDE ACETATE 150 MG PO TABS: 150.0000 mg | ORAL_TABLET | Freq: Two times a day (BID) | ORAL | Status: DC

## 2014-02-03 MED ORDER — FLECAINIDE ACETATE 100 MG PO TABS: 150.0000 mg | ORAL_TABLET | Freq: Two times a day (BID) | ORAL | Status: DC

## 2014-02-07 ENCOUNTER — Other Ambulatory Visit: Payer: Self-pay | Admitting: Family Medicine

## 2014-03-07 ENCOUNTER — Telehealth: Payer: Self-pay | Admitting: Internal Medicine

## 2014-03-07 NOTE — Telephone Encounter (Signed)
New Prob   Pt states she recently lost her insurance and is requesting an alternative or generic for flecainide. Please call.

## 2014-03-08 NOTE — Telephone Encounter (Signed)
Explained that I would get with Lovett Sox on our medication team to discuss options.   Pt tells me that she only has 2 days of this medication left. Explained that we would take care of this as fast as we could. Pt agreeable to current plan.

## 2014-03-09 NOTE — Telephone Encounter (Signed)
Advised pt that I am still looking into this matter. Informed her that Maudie Mercury, medication resource, hasn't been in office the past few days, but should return tomorrow and I will have her investigate any resource options we have.  Also gave pt needymeds.org to check on available help. Pt thankful for this and agreeable to plan.

## 2014-04-22 ENCOUNTER — Telehealth: Payer: Self-pay | Admitting: Family Medicine

## 2014-04-22 NOTE — Telephone Encounter (Signed)
Rebecca Marshall needed to have all her prescriptions written for 3 mo supply good for 1 year so she can send to [facility] since she has now changed insurance companies

## 2014-04-25 MED ORDER — TRAZODONE HCL 100 MG PO TABS: ORAL_TABLET | ORAL | Status: DC

## 2014-04-25 MED ORDER — LEVOTHYROXINE SODIUM 150 MCG PO TABS: 150.0000 ug | ORAL_TABLET | Freq: Every day | ORAL | Status: DC

## 2014-04-25 MED ORDER — PANTOPRAZOLE SODIUM 40 MG PO TBEC: 40.0000 mg | DELAYED_RELEASE_TABLET | Freq: Two times a day (BID) | ORAL | Status: DC

## 2014-04-25 MED ORDER — DIAZEPAM 10 MG PO TABS: 5.0000 mg | ORAL_TABLET | Freq: Two times a day (BID) | ORAL | Status: DC | PRN

## 2014-04-25 MED ORDER — FLUOXETINE HCL 40 MG PO CAPS: 40.0000 mg | ORAL_CAPSULE | Freq: Every morning | ORAL | Status: DC

## 2014-04-25 MED ORDER — FLECAINIDE ACETATE 150 MG PO TABS: 150.0000 mg | ORAL_TABLET | Freq: Two times a day (BID) | ORAL | Status: DC

## 2014-04-25 NOTE — Telephone Encounter (Signed)
Left message on patients voicemail.Rebecca Marshall, Rebecca Marshall

## 2014-04-25 NOTE — Telephone Encounter (Signed)
I can send most of them directly to [facility]. I wil lmail her the diazepam rx as it cannot be sent that way [facility]! Dorcas Mcmurray

## 2014-04-27 ENCOUNTER — Telehealth: Payer: Self-pay | Admitting: *Deleted

## 2014-04-27 DIAGNOSIS — E039 Hypothyroidism, unspecified: Secondary | ICD-10-CM

## 2014-04-27 DIAGNOSIS — K219 Gastro-esophageal reflux disease without esophagitis: Secondary | ICD-10-CM

## 2014-04-27 DIAGNOSIS — F32A Depression, unspecified: Secondary | ICD-10-CM

## 2014-04-27 DIAGNOSIS — I4891 Unspecified atrial fibrillation: Secondary | ICD-10-CM

## 2014-04-27 DIAGNOSIS — F329 Major depressive disorder, single episode, unspecified: Secondary | ICD-10-CM

## 2014-04-27 MED ORDER — TRAZODONE HCL 100 MG PO TABS: ORAL_TABLET | ORAL | Status: DC

## 2014-04-27 MED ORDER — FLUOXETINE HCL 40 MG PO CAPS: 40.0000 mg | ORAL_CAPSULE | Freq: Every morning | ORAL | Status: DC

## 2014-04-27 MED ORDER — LEVOTHYROXINE SODIUM 150 MCG PO TABS: 150.0000 ug | ORAL_TABLET | Freq: Every day | ORAL | Status: DC

## 2014-04-27 MED ORDER — FLECAINIDE ACETATE 150 MG PO TABS: 150.0000 mg | ORAL_TABLET | Freq: Two times a day (BID) | ORAL | Status: DC

## 2014-04-27 MED ORDER — PANTOPRAZOLE SODIUM 40 MG PO TBEC: 40.0000 mg | DELAYED_RELEASE_TABLET | Freq: Two times a day (BID) | ORAL | Status: DC

## 2014-04-27 NOTE — Telephone Encounter (Signed)
Received fax from [place] stating they need to verify patients ID number, name, date of birth and current address/ phone number.  Pt was called regarding this matter and called [facility] to correct information.  Pt gave nurse new address and nurse consulted with Dr. Nori Riis regarding this matter.  Medications were sent electronically to [facility] again.  Rebecca Barrow, RN

## 2014-05-11 ENCOUNTER — Ambulatory Visit (INDEPENDENT_AMBULATORY_CARE_PROVIDER_SITE_OTHER): Payer: BC Managed Care – PPO | Admitting: Family Medicine

## 2014-05-11 ENCOUNTER — Encounter: Payer: Self-pay | Admitting: Family Medicine

## 2014-05-11 VITALS — BP 120/82 | HR 76 | Ht 68.0 in | Wt 208.5 lb

## 2014-05-11 DIAGNOSIS — Z833 Family history of diabetes mellitus: Secondary | ICD-10-CM

## 2014-05-11 DIAGNOSIS — C73 Malignant neoplasm of thyroid gland: Secondary | ICD-10-CM

## 2014-05-11 DIAGNOSIS — F341 Dysthymic disorder: Secondary | ICD-10-CM | POA: Diagnosis not present

## 2014-05-11 DIAGNOSIS — R413 Other amnesia: Secondary | ICD-10-CM | POA: Diagnosis not present

## 2014-05-11 HISTORY — DX: Family history of diabetes mellitus: Z83.3

## 2014-05-11 LAB — TSH: TSH: 1.445 u[IU]/mL (ref 0.350–4.500)

## 2014-05-11 LAB — POCT GLYCOSYLATED HEMOGLOBIN (HGB A1C): Hemoglobin A1C: 5.4

## 2014-05-11 MED ORDER — TRIAMCINOLONE ACETONIDE 0.1 % EX CREA: 1.0000 "application " | TOPICAL_CREAM | Freq: Two times a day (BID) | CUTANEOUS | Status: DC

## 2014-05-11 MED ORDER — OXYBUTYNIN CHLORIDE ER 5 MG PO TB24: 5.0000 mg | ORAL_TABLET | Freq: Every day | ORAL | Status: DC

## 2014-05-12 DIAGNOSIS — R413 Other amnesia: Secondary | ICD-10-CM | POA: Insufficient documentation

## 2014-05-12 HISTORY — DX: Other amnesia: R41.3

## 2014-05-12 NOTE — Assessment & Plan Note (Signed)
She reports doing very well currently on her medication is normal and make any changes

## 2014-05-12 NOTE — Assessment & Plan Note (Signed)
Much or if this is normal aging with recent changes including her marital status. She is contemplating applying for disability. We discussed how that system works. Should she desire for me to set up some neuropsychiatric testing in the future should get back with me.

## 2014-05-12 NOTE — Assessment & Plan Note (Signed)
We'll check A1c today.

## 2014-05-12 NOTE — Progress Notes (Signed)
   Subjective:    Patient ID: Rebecca Marshall, female    DOB: 1951-02-10, 63 y.o.   MRN: [id number]  HPI  #1. Increased incontinence of urine particularly with laughing or picking up heavy things. Also that she has not been to the restroom and while, when she has to go she will sometimes have leakage. #2. Has a rash on her arm that's been there intermittently for about the last 6 months. It's very small area and slightly itchy. #3. Complaining of difficulty remembering things. Was working part time 4 hours a day but was not able to keep up with the details of her job so she quit. She's worried that she is getting some type of dementia. The she really loses her focus and concentration ability in the last 6-12 months. #4. Also be checked for diabetes his there's a family history. She has continued to gain some weight and is unhappy about that. Normal wall areas of arthritis from being as active as she would like to be. #4. Recently got married and she is quite happy about that #5. Having some visual problems with pain but has scheduled him with her ophthalmologist tomorrow.  Review of Systems No fever, sweats, chills. She's had some mild gradual weight gain. No change in bowel  Habits. She has increased problems with urinary incontinence. Increased problems with multiple joint pains. Increased issues with focusing, concentration, short term memory.    Objective:   Physical Exam  Vital signs reviewed. GENERAL: Well-developed, well-nourished, no acute distress. CARDIOVASCULAR: Regular rate and rhythm no murmur gallop or rub LUNGS: Clear to auscultation bilaterally, no rales or wheeze. ABDOMEN: Soft positive bowel sounds NEURO: No gross focal neurological deficits. MSK: Movement of extremity x 4. PSYCHIATRIC: Alert and oriented x4. Affect is interactive. Speech is normal in content and fluency. She has good eye contact. Neatly dressed. No hallucinations. SKIN: Quarter-sized area with some red  macules on her forearm, volar.        Assessment & Plan:   Small area of eczema on her arm. We'll treat her with some topical steroids. That doesn't work we can try Dovonex

## 2014-05-16 ENCOUNTER — Encounter: Payer: Self-pay | Admitting: Family Medicine

## 2014-05-24 ENCOUNTER — Telehealth: Payer: Self-pay | Admitting: Family Medicine

## 2014-05-24 MED ORDER — OXYBUTYNIN CHLORIDE ER 10 MG PO TB24: 10.0000 mg | ORAL_TABLET | Freq: Every day | ORAL | Status: DC

## 2014-05-24 NOTE — Telephone Encounter (Signed)
Pt called and would like Dr. Nori Riis to increase her mg's on her Ditropan 5 mg to 10 mg. Blima Rich

## 2014-05-25 ENCOUNTER — Other Ambulatory Visit: Payer: Self-pay | Admitting: Family Medicine

## 2014-05-25 MED ORDER — OXYBUTYNIN CHLORIDE ER 10 MG PO TB24: 10.0000 mg | ORAL_TABLET | Freq: Every day | ORAL | Status: DC

## 2014-07-12 ENCOUNTER — Telehealth: Payer: Self-pay | Admitting: Internal Medicine

## 2014-07-12 DIAGNOSIS — R001 Bradycardia, unspecified: Secondary | ICD-10-CM

## 2014-07-12 DIAGNOSIS — R000 Tachycardia, unspecified: Secondary | ICD-10-CM

## 2014-07-12 NOTE — Telephone Encounter (Signed)
Patient explains this is not an acute event. These are same problems she spoke with Dr. Caryl Comes about in August. She states that last night HR went over 200, and this alarmed her. Then her HR will go down into the 20/30s and she passes out. These events occur 2-3 times per month. Will review with Dr. Caryl Comes. She is ok with this plan.

## 2014-07-12 NOTE — Telephone Encounter (Signed)
New Message  Pt called reports her Pulse is sttill going over 200 and below 50 and she keeps passing out.. Medication is not working please call back to disucss.

## 2014-07-13 NOTE — Telephone Encounter (Signed)
Recommend event monitor. Pt is agreeable to this and understands we will contact her to arrange this.

## 2014-08-01 ENCOUNTER — Other Ambulatory Visit: Payer: Self-pay | Admitting: *Deleted

## 2014-08-04 MED ORDER — DIAZEPAM 10 MG PO TABS: 5.0000 mg | ORAL_TABLET | Freq: Two times a day (BID) | ORAL | Status: DC | PRN

## 2014-08-04 NOTE — Telephone Encounter (Signed)
Dear [name] Team Please call in [facility]! Rebecca Marshall

## 2014-08-04 NOTE — Telephone Encounter (Signed)
rx called in for patient 

## 2014-08-09 ENCOUNTER — Other Ambulatory Visit: Payer: Self-pay | Admitting: Family Medicine

## 2014-09-28 ENCOUNTER — Ambulatory Visit: Payer: Self-pay | Admitting: Family Medicine

## 2014-09-28 ENCOUNTER — Encounter: Payer: Self-pay | Admitting: Family Medicine

## 2014-09-28 ENCOUNTER — Ambulatory Visit (INDEPENDENT_AMBULATORY_CARE_PROVIDER_SITE_OTHER): Payer: BLUE CROSS/BLUE SHIELD | Admitting: Family Medicine

## 2014-09-28 VITALS — BP 127/72 | HR 91 | Temp 98.2°F | Ht 68.0 in | Wt 219.0 lb

## 2014-09-28 DIAGNOSIS — K219 Gastro-esophageal reflux disease without esophagitis: Secondary | ICD-10-CM | POA: Diagnosis not present

## 2014-09-28 DIAGNOSIS — R251 Tremor, unspecified: Secondary | ICD-10-CM

## 2014-09-28 DIAGNOSIS — R4789 Other speech disturbances: Secondary | ICD-10-CM | POA: Diagnosis not present

## 2014-09-28 DIAGNOSIS — R269 Unspecified abnormalities of gait and mobility: Secondary | ICD-10-CM | POA: Diagnosis not present

## 2014-09-29 NOTE — Assessment & Plan Note (Signed)
She is truly having that much breakthrough symptoms while on twice a day Protonix and I think she needs to go back and see her gastroenterologist. She agrees to set this up and call me if there is any issue needing referral. For right now we'll continue her current therapy.

## 2014-09-29 NOTE — Progress Notes (Signed)
   Subjective:    Patient ID: Rebecca Marshall, female    DOB: 1951-01-18, 64 y.o.   MRN: [id number]  HPI Concern for multiple episodes of word finding difficulties over the last 36 months. Over the holidays her family met and told her how concerned they were with her ability to focus on tasks, remember things and to tell her they had noted multiple episodes of her having those unsteadiness of gait and were finding difficulties. She also notes that she's having more tremor in her hands with activity. She also says she's noted that she seems to be more clumsy when walking and at times feels like she is off balance. She does note that in the last few months she is started chronic opioid therapy through pain management for her chronic right hip and low back pain. She does not think this coincided exactly with her symptoms but agrees there is some overlap in the time periods. She is worried that she has early dementia or some other type of degenerative central nervous system disorder. She requests neurological evaluation and referral. #2. Says she's having breakthrough symptoms of reflux multiple times a day despite the fact that she's taking her Protonix twice a day. No emesis, no nausea just a lot of heartburn. No constipation no diarrhea. No unusual weight loss.  Review of Systems  Gastrointestinal: Negative for vomiting, abdominal pain, diarrhea and blood in stool.  Neurological: Positive for tremors and speech difficulty. Negative for dizziness, seizures, syncope, facial asymmetry, weakness, light-headedness, numbness and headaches.  Psychiatric/Behavioral: Positive for decreased concentration. Negative for hallucinations, confusion, sleep disturbance and agitation.   See history of present illness above.    Objective:   Physical Exam  Constitutional: She is oriented to person, place, and time. She appears well-developed and well-nourished.  Neck: Normal range of motion. Neck supple. No thyromegaly  present.  Musculoskeletal: Normal range of motion.  Neurological: She is alert and oriented to person, place, and time. She has normal reflexes. She displays normal reflexes. No cranial nerve deficit. She exhibits normal muscle tone. Coordination normal.  Intention tremor noted on left hand. Mild. No speech issues noted today. Gait appears normal.  Psychiatric: She has a normal mood and affect. Her behavior is normal. Judgment and thought content normal.          Assessment & Plan:  Patient concern for early dementia or other degenerative central nervous system disorder. I really think most of this is related to her recent initiation for treatment of her chronic pain. She had to have hip replacement and a total revision done in the last 10 years and was left with some fairly debilitating chronic right hip pain on top of some mild to moderate chronic low back pain. She tells me she started seeing someone at the pain clinic and was placed on Opana 7.5 mg twice daily in the last 2-4 months. She's not sure that the symptoms she's worried about exactly overlap with that time frame (starting opana). We discussed at some length. I really think is most likely related to opioid treatment. Some of her gait abnormality is probably from her failed hip replacement and subsequent revision. She seeks referral and I  think that she will not be re-assured without further evaluation so I wlll set her up with neurology evaluation.

## 2014-10-11 ENCOUNTER — Other Ambulatory Visit: Payer: Self-pay

## 2014-10-11 DIAGNOSIS — Z1231 Encounter for screening mammogram for malignant neoplasm of breast: Secondary | ICD-10-CM

## 2014-10-12 ENCOUNTER — Ambulatory Visit (INDEPENDENT_AMBULATORY_CARE_PROVIDER_SITE_OTHER): Payer: BLUE CROSS/BLUE SHIELD | Admitting: Neurology

## 2014-10-12 ENCOUNTER — Encounter: Payer: Self-pay | Admitting: Neurology

## 2014-10-12 VITALS — BP 109/69 | HR 66 | Ht 68.0 in | Wt 219.0 lb

## 2014-10-12 DIAGNOSIS — R404 Transient alteration of awareness: Secondary | ICD-10-CM

## 2014-10-12 DIAGNOSIS — R0681 Apnea, not elsewhere classified: Secondary | ICD-10-CM

## 2014-10-12 DIAGNOSIS — R0683 Snoring: Secondary | ICD-10-CM

## 2014-10-12 DIAGNOSIS — R4189 Other symptoms and signs involving cognitive functions and awareness: Secondary | ICD-10-CM | POA: Insufficient documentation

## 2014-10-12 DIAGNOSIS — G4710 Hypersomnia, unspecified: Secondary | ICD-10-CM

## 2014-10-12 HISTORY — DX: Other symptoms and signs involving cognitive functions and awareness: R41.89

## 2014-10-12 NOTE — Patient Instructions (Signed)
Overall you are doing fairly well but I do want to suggest a few things today:   Remember to drink plenty of fluid, eat healthy meals and do not skip any meals. Try to eat protein with a every meal and eat a healthy snack such as fruit or nuts in between meals. Try to keep a regular sleep-wake schedule and try to exercise daily, particularly in the form of walking, 20-30 minutes a day, if you can.   As far as diagnostic testing: MRI of the brain, labwork, neuropsychological testing  I would like to see you back in 3 months, sooner if we need to. Please call us with any interim questions, concerns, problems, updates or refill requests.   Please also call us for any test results so we can go over those with you on the phone.  My clinical assistant and will answer any of your questions and relay your messages to me and also relay most of my messages to you.   Our phone number is [phone]. We also have an after hours call service for urgent matters and there is a physician on-call for urgent questions. For any emergencies you know to call 911 or go to the nearest emergency room

## 2014-10-12 NOTE — Progress Notes (Addendum)
[id number]    Provider:  Dr Jaynee Eagles Referring Provider: Dickie La, MD Primary Care Physician:  Dorcas Mcmurray, MD   CC:  Memory loss  HPI:  Rebecca Marshall is a 64 y.o. female here as a referral from Dr. Nori Riis for word-finding difficulty, abnormality of gait, tremor. PMHx of Fibromyalgia, anxiety, depression, papillary adenocarcinoma of thyrpoid gland, arthritis, migraine.    She had a concussion 6 months and is having cognitive dysfunction, episodes of disorientation and altered awareness, symptoms worsened since then. She is having memory loss, not remembering what was recently said in a meeting or that she has asked a question. Asking the same question over and over again.  Family is noticing. Progressing, getting worse. She has shaking of her hands, left is worse than right. She has had left hip replaced right, has right hip problems, gets knee injection and so she falls and carries a cane. She was in PT. She is very off balance, has a hard time steadying herself. No paresthesias. She feels her quads are stiff and she is dragging her feet. 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 Her mood is good, she has had episodes of depression in her life and currently is in therapy. She also has migraines. She has episodes disorientation, sometimes she notices she is driving but doesn't know where she is.    Patient also reports that she snores heavily, stops breathing at night and is excessively tired during the day.  Reviewed notes, labs and imaging from outside physicians, which showed: April 2014 CT of the head showed No acute intracranial abnormalities including mass lesion or mass effect, hydrocephalus, extra-axial fluid collection, midline shift, hemorrhage, or acute infarction, large ischemic events (personally reviewed images). TSH nml. HgbA1c 5.4, cbc nml, bmp nml.     Review of Systems: Patient complains of symptoms per HPI as well as the following symptoms: weight gain, eye pain, palpitations, snoring, incontinence, trouble swallowing, blood in stool, comstipation, joint pain, aching muscles, memory loss, confusion, headache, weakness, difficulty swallowing, dizziness, tremor, snoring, decreased energy, disinterest in activities, was having suicidal thoughts but denies currently since seeing a therapist. Pertinent negatives per HPI. All others negative.   History   Social History  . Marital Status: Married    Spouse Name: [name]    Number of Children: 3  . Years of Education: 12+   Occupational History  . unemployed     Social History Main Topics  . Smoking status: Never Smoker   . Smokeless tobacco: Never Used  . Alcohol Use: Yes     Comment: socially  . Drug Use: Yes  . Sexual Activity: Not on file   Other Topics Concern  . Not on file   Social History Narrative   Patient lives at home alone    Patient is married    Patient is not working    Patient has 3 children    Patient has her BSN RN   Patient is right handed     Family History  Problem Relation Age of Onset  . Cancer Other   . Alcohol abuse Mother   . Cancer Mother 61    Lung   . Alcohol abuse Father   . Cancer Sister 90    Breast    Past Medical History  Diagnosis Date  . Atrial dysrhythmia   .  Fibromyalgia   . Cancer     thyroid-status post resection on replacement Dr. Chalmers Cater  . Anxiety   . Depression   . Thyroid disease   . GE reflux   . Arthritis   . Osteoporosis     Past Surgical History  Procedure Laterality Date  . Cholecystectomy    . Thyroidectomy  04/2009    papillary carcimao Dr. Erik Obey  . Total hip arthroplasty  2009 and 2011    L hip   . Tubal ligation    . Abdominal hysterectomy    . Colonoscopy  04/2011  . Toe surgery      bone spur removed - rt great toe    Current Outpatient Prescriptions  Medication Sig Dispense Refill  .  calcium-vitamin D (OSCAL WITH D) 500-200 MG-UNIT per tablet Take 1 tablet by mouth every morning.    . diazepam (VALIUM) 10 MG tablet Take 0.5 tablets (5 mg total) by mouth every 12 (twelve) hours as needed for anxiety. 30 tablet 4  . flecainide (TAMBOCOR) 150 MG tablet Take 1 tablet (150 mg total) by mouth 2 (two) times daily. 180 tablet 3  . FLUoxetine (PROZAC) 40 MG capsule Take 1 capsule (40 mg total) by mouth every morning. 90 capsule 3  . HYDROmorphone (DILAUDID) 2 MG tablet 2 mg every 4 (four) hours as needed.     Marland Kitchen levothyroxine (SYNTHROID, LEVOTHROID) 150 MCG tablet Take 1 tablet (150 mcg total) by mouth daily. 90 tablet 3  . oxybutynin (DITROPAN XL) 10 MG 24 hr tablet Take 1 tablet (10 mg total) by mouth at bedtime. 90 tablet 3  . pantoprazole (PROTONIX) 40 MG tablet Take 1 tablet (40 mg total) by mouth 2 (two) times daily. 180 tablet 3  . traZODone (DESYREL) 100 MG tablet TAKE 2 TABLETS BY MOUTH AT BEDTIME. 180 tablet 3  . triamcinolone cream (KENALOG) 0.1 % Apply 1 application topically 2 (two) times daily. 30 g 0  . polyethylene glycol powder (GLYCOLAX/MIRALAX) powder USE 1 CAPFUL ONCE DAILY (Patient not taking: Reported on 10/12/2014) 1581 g 3   No current facility-administered medications for this visit.    Allergies as of 10/12/2014 - Review Complete 10/12/2014  Allergen Reaction Noted  . Codeine Nausea And Vomiting 09/25/2012  . Fentanyl Nausea And Vomiting 09/25/2012    Vitals: BP 109/69 mmHg  Pulse 66  Ht 5\' 8"  (1.727 m)  Wt 219 lb (99.338 kg)  BMI 33.31 kg/m2 Last Weight:  Wt Readings from Last 1 Encounters:  10/12/14 219 lb (99.338 kg)   Last Height:   Ht Readings from Last 1 Encounters:  10/12/14 5\' 8"  (1.727 m)    Physical exam: Exam: Gen: NAD, conversant, well nourised                     CV: RRR, no MRG. No Carotid Bruits. No peripheral edema, warm, nontender Eyes: Conjunctivae clear without exudates or hemorrhage  Neuro: Detailed Neurologic  Exam  Speech:    Speech is normal; fluent and spontaneous with normal comprehension.  Cognition: MoCA 30/30    The patient is oriented to person, place, and time;     recent and remote memory intact;     language fluent;     normal attention, concentration,     fund of knowledge Cranial Nerves:    The pupils are equal, round, and reactive to light. The fundi are without edema. Visual fields are full to finger confrontation. Extraocular movements are intact. Trigeminal sensation  is intact and the muscles of mastication are normal. The face is symmetric. The palate elevates in the midline. Hearing intact. Voice is normal. Shoulder shrug is normal. The tongue has normal motion without fasciculations.   Coordination:    Normal finger to nose and heel to shin. Normal rapid alternating movements.   Gait:    Wide based due to hip pain and osteoarthritis  Motor Observation:    High freq postural tremor and intension, right > left  Tone:    Normal muscle tone.    Posture:    Posture is normal. normal erect    Strength: She has giveway on deltoid and hip flexion strength testing but otherwise strength is V/V in the upper and lower limbs.      Sensation: intact to LT     Reflex Exam:  DTR's:    Deep tendon reflexes in the upper and lower extremities are normal bilaterally.   Toes:    The toes are downgoing bilaterally.   Clonus:    Clonus is absent.     Assessment/Plan:  64 year old female with PMHx of Fibromyalgia, anxiety, depression, papillary adenocarcinoma of thyrpoid gland, arthritis, migraine who is here for evaluation of cognitive changes, episodes of orientation, tremor, gait dysfunction and multiple other systemic and neurologic complaints. Her MoCA is 30/30 in the office. Suspect psychiatric issues major contribution to her symptoms.  Suspect abnormality of gait is multifactorial including hip replacement and osteoarthritis of knees. No parkinsonism.  Patient also reports  that she snores heavily, stops breathing at night and is excessively tired during the day.   MRI of the brain w/wo contrast Neuropsycological evaluation.  Will order a dementia serum workup Will order sleep study and have the sleep team follow up and see if sleep study is warranted. She has a history of concussion.  Will order EEG for episodes of disorientation F/u after   Sarina Ill, MD  [facility] [address] [place], [place] [address]  Phone [phone] Fax [phone]

## 2014-10-13 ENCOUNTER — Other Ambulatory Visit: Payer: Self-pay | Admitting: *Deleted

## 2014-10-13 NOTE — Telephone Encounter (Signed)
90 day supply. Martin, Tamika L, RN  

## 2014-10-17 ENCOUNTER — Telehealth: Payer: Self-pay | Admitting: *Deleted

## 2014-10-17 ENCOUNTER — Other Ambulatory Visit (INDEPENDENT_AMBULATORY_CARE_PROVIDER_SITE_OTHER): Payer: Self-pay

## 2014-10-17 DIAGNOSIS — Z0289 Encounter for other administrative examinations: Secondary | ICD-10-CM

## 2014-10-17 MED ORDER — POLYETHYLENE GLYCOL 3350 17 GM/SCOOP PO POWD: ORAL | Status: DC

## 2014-10-17 MED ORDER — DIAZEPAM 10 MG PO TABS: 5.0000 mg | ORAL_TABLET | Freq: Two times a day (BID) | ORAL | Status: DC | PRN

## 2014-10-17 NOTE — Telephone Encounter (Signed)
I tried to call and left a message. Please call patient back and let her know that I ordered a sleep study for her. The sleep team will call to screen her and set up a sleep study if warranted. Also, she can call the office and schedule her EEG at her convenience. Thanks.

## 2014-10-17 NOTE — Telephone Encounter (Signed)
Patient calling back if she does not answer you can leave a detailed message. Stated to patient 24-48 hour turn around Patient understood process.

## 2014-10-17 NOTE — Addendum Note (Signed)
Addended by: Sarina Ill B on: 10/17/2014 04:02 PM   Modules accepted: Orders

## 2014-10-17 NOTE — Telephone Encounter (Signed)
Dear Rebecca Marshall Team Please call in her diazepam THANKS! Dorcas Mcmurray

## 2014-10-18 ENCOUNTER — Telehealth: Payer: Self-pay | Admitting: Neurology

## 2014-10-18 ENCOUNTER — Ambulatory Visit
Admission: RE | Admit: 2014-10-18 | Discharge: 2014-10-18 | Disposition: A | Discharge status: Home / Self Care | Payer: BLUE CROSS/BLUE SHIELD | Source: Ambulatory Visit

## 2014-10-18 DIAGNOSIS — G4730 Sleep apnea, unspecified: Secondary | ICD-10-CM

## 2014-10-18 DIAGNOSIS — E669 Obesity, unspecified: Secondary | ICD-10-CM

## 2014-10-18 DIAGNOSIS — G4710 Hypersomnia, unspecified: Secondary | ICD-10-CM

## 2014-10-18 DIAGNOSIS — Z1231 Encounter for screening mammogram for malignant neoplasm of breast: Secondary | ICD-10-CM

## 2014-10-18 DIAGNOSIS — R0683 Snoring: Secondary | ICD-10-CM

## 2014-10-18 NOTE — Telephone Encounter (Signed)
Rebecca Marshall,  I thank you for the detailed history  on this patient. I agree,she needs a split study based on obesity, snoring and witnessed apneas.

## 2014-10-18 NOTE — Telephone Encounter (Signed)
Dr. Sarina Ill, refers patient for attended sleep study.  Height: 5'8"  Weight: 219 lb  BMI: 33.31  Past Medical History:  Atrial dysrhythmia    . Fibromyalgia   . Cancer     thyroid-status post resection on replacement Dr. Chalmers Cater  . Anxiety   . Depression   . Thyroid disease   . GE reflux   . Arthritis   . Osteoporosis      Sleep Symptoms: snores heavily, stops breathing at night and is excessively tired during the day, headache, decreased energy   Epworth Score: Contacted patient and went over her ESS (13)   Medication:  Calcium Carbonate-Vitamin D (Tab) OSCAL WITH D 500-200 MG-UNIT Take 1 tablet by mouth every morning.       Diazepam (Tab) VALIUM 10 MG Take 0.5 tablets (5 mg total) by mouth every 12 (twelve) hours as needed for anxiety.      FLUoxetine HCl (Cap) PROZAC 40 MG Take 1 capsule (40 mg total) by mouth every morning.      Flecainide Acetate (Tab) TAMBOCOR 150 MG Take 1 tablet (150 mg total) by mouth 2 (two) times daily.      HYDROmorphone HCl (Tab) DILAUDID 2 MG 2 mg every 4 (four) hours as needed.       Levothyroxine Sodium (Tab) SYNTHROID, LEVOTHROID 150 MCG Take 1 tablet (150 mcg total) by mouth daily.      Oxybutynin Chloride (Tablet SR 24 hr) DITROPAN-XL 10 MG Take 1 tablet (10 mg total) by mouth at bedtime.      Pantoprazole Sodium (Tablet Delayed Response) PROTONIX 40 MG Take 1 tablet (40 mg total) by mouth 2 (two) times daily.      Polyethylene Glycol 3350 (Powder) GLYCOLAX/MIRALAX  USE 1 CAPFUL ONCE DAILY      TraZODone HCl (Tab) DESYREL 100 MG TAKE 2 TABLETS BY MOUTH AT BEDTIME.      Triamcinolone Acetonide (Cream) KENALOG 0.1 % Apply 1 application topically 2 (two) times daily.      .          Ins: [employer]   Assessment & Plan: 64 year old female with PMHx of Fibromyalgia, anxiety, depression, papillary adenocarcinoma of thyrpoid gland, arthritis, migraine who is here for evaluation of  cognitive changes, episodes of orientation, tremor, gait dysfunction and multiple other systemic and neurologic complaints. Her MoCA is 30/30 in the office. Suspect psychiatric issues major contribution to her symptoms. Suspect abnormality of gait is multifactorial including hip replacement and osteoarthritis of knees. No parkinsonism.  Patient also reports that she snores heavily, stops breathing at night and is excessively tired during the day.   MRI of the brain w/wo contrast Neuropsycological evaluation.  Will order a dementia serum workup Will order sleep study and have the sleep team follow up and see if sleep study is warranted. She has a history of concussion.  Will order EEG for episodes of disorientation F/u after   Sarina Ill, MD  Please review patient information and submit instructions for scheduling and orders for sleep technologist. Thank you.

## 2014-10-18 NOTE — Telephone Encounter (Signed)
Spoke with patient and she is aware of sleep study that is ordered. Patient will call to schedule EEG at a later date.

## 2014-10-18 NOTE — Telephone Encounter (Signed)
Refill request completed and signed by Dr. Nori Riis. Faxed to [facility] today.  Derl Barrow, RN

## 2014-10-19 ENCOUNTER — Ambulatory Visit (INDEPENDENT_AMBULATORY_CARE_PROVIDER_SITE_OTHER): Payer: BLUE CROSS/BLUE SHIELD

## 2014-10-19 DIAGNOSIS — R4189 Other symptoms and signs involving cognitive functions and awareness: Secondary | ICD-10-CM

## 2014-10-19 MED ORDER — GADOPENTETATE DIMEGLUMINE 469.01 MG/ML IV SOLN: 20.0000 mL | Freq: Once | INTRAVENOUS | Status: AC | PRN

## 2014-10-20 ENCOUNTER — Ambulatory Visit (INDEPENDENT_AMBULATORY_CARE_PROVIDER_SITE_OTHER): Payer: BLUE CROSS/BLUE SHIELD | Admitting: Neurology

## 2014-10-20 DIAGNOSIS — R404 Transient alteration of awareness: Secondary | ICD-10-CM

## 2014-10-20 NOTE — Procedures (Signed)
    History:  Rebecca Marshall is a 64 year old patient with a history of a concussion 6 months prior to this evaluation. She has had episodes of disorientation and altered awareness since that time. She is being evaluated for this issue.  This is a routine EEG. No skull defects are noted. Medications include calcium supplementation, diazepam, Tambocor, Prozac, Dilaudid, Synthroid, oxybutynin, Protonix, MiraLAX, trazodone, and Kenalog cream.   EEG classification: Normal awake  Description of the recording: The background rhythms of this recording consists of a fairly well modulated medium amplitude alpha rhythm of 10 Hz that is reactive to eye opening and closure. As the record progresses, the patient appears to remain in the waking state throughout the recording. Photic stimulation was performed, resulting in a bilateral and symmetric photic driving response. Hyperventilation was also performed, resulting in a minimal buildup of the background rhythm activities without significant slowing seen. At no time during the recording does there appear to be evidence of spike or spike wave discharges or evidence of focal slowing. EKG monitor shows no evidence of cardiac rhythm abnormalities with a heart rate of 66.  Impression: This is a normal EEG recording in the waking state. No evidence of ictal or interictal discharges are seen.

## 2014-10-21 ENCOUNTER — Telehealth: Payer: Self-pay | Admitting: Neurology

## 2014-10-21 LAB — HIV ANTIBODY (ROUTINE TESTING W REFLEX)
HIV 1/O/2 Abs-Index Value: <1 (ref <1.00)
HIV-1/HIV-2 Ab: NONREACTIVE

## 2014-10-21 LAB — RPR: RPR Ser Ql: NONREACTIVE

## 2014-10-21 LAB — SEDIMENTATION RATE: Sed Rate: 8 mm/hr (ref 0–40)

## 2014-10-21 LAB — B12 AND FOLATE PANEL
Folate: 12.3 ng/mL (ref >3.0)
Vitamin B-12: 531 pg/mL (ref 211–946)

## 2014-10-21 LAB — VITAMIN B1, WHOLE BLOOD: Thiamine: 114.4 nmol/L (ref 66.5–200.0)

## 2014-10-21 LAB — ANA W/REFLEX: Anti Nuclear Antibody(ANA): NEGATIVE

## 2014-10-21 LAB — METHYLMALONIC ACID, SERUM: Methylmalonic Acid: 139 nmol/L (ref 0–378)

## 2014-10-21 NOTE — Telephone Encounter (Signed)
Please advise 

## 2014-10-21 NOTE — Telephone Encounter (Signed)
Patient is calling to get MRI and EEG results. Please call. Thank you.

## 2014-10-23 ENCOUNTER — Ambulatory Visit (INDEPENDENT_AMBULATORY_CARE_PROVIDER_SITE_OTHER): Payer: BLUE CROSS/BLUE SHIELD | Admitting: Neurology

## 2014-10-23 DIAGNOSIS — G4730 Sleep apnea, unspecified: Principal | ICD-10-CM

## 2014-10-23 DIAGNOSIS — R0683 Snoring: Secondary | ICD-10-CM

## 2014-10-23 DIAGNOSIS — E669 Obesity, unspecified: Secondary | ICD-10-CM

## 2014-10-23 DIAGNOSIS — G4710 Hypersomnia, unspecified: Secondary | ICD-10-CM

## 2014-10-24 NOTE — Telephone Encounter (Signed)
Patient verbalized understanding of normal results. Patient stated she had a sleep study done on 1/31 and she needs a CPAP.

## 2014-10-24 NOTE — Telephone Encounter (Signed)
Rebecca Marshall - I would like Emma to call back this patient and give her results. She can see me about it before she calls. Thank you!

## 2014-10-24 NOTE — Sleep Study (Signed)
Please see the scanned sleep study interpretation located in the Procedure tab within the Chart Review section. 

## 2014-10-25 ENCOUNTER — Encounter: Payer: Self-pay | Admitting: Psychology

## 2014-10-26 NOTE — Telephone Encounter (Signed)
This study took place 1-31 to 10-24-14 , and is not yet ready to be read. Will read in AM. CD

## 2014-10-28 ENCOUNTER — Other Ambulatory Visit: Payer: Self-pay | Admitting: Neurology

## 2014-10-28 ENCOUNTER — Encounter: Payer: BLUE CROSS/BLUE SHIELD | Admitting: Neurology

## 2014-10-28 ENCOUNTER — Encounter: Payer: Self-pay | Admitting: *Deleted

## 2014-10-28 ENCOUNTER — Telehealth: Payer: Self-pay | Admitting: *Deleted

## 2014-10-28 DIAGNOSIS — G4733 Obstructive sleep apnea (adult) (pediatric): Secondary | ICD-10-CM

## 2014-10-28 NOTE — Telephone Encounter (Signed)
Study was read yesterday, interpretation faxed to referring physician and ordered CPAP as well. Patient was called by Larwance Sachs.

## 2014-10-28 NOTE — Telephone Encounter (Signed)
Thank you :)

## 2014-10-28 NOTE — Telephone Encounter (Signed)
The patient was contacted and provided the results of her split night sleep study that did reveal sleep disordered breathing.  Patient was informed that treatment in the form of CPAP therapy was recommended for home use and that it was considered effective in treatment.  Patient was excited to begin treatment and had already contacted her insurance company to discover that [facility] was a preferred provider.  Patient was referred to [facility] for CPAP set up.  Dr. Dorcas Mcmurray was routed a copy of the results.   Patient instructed to contact our office 6-8 weeks post set up to schedule a follow up appointment.  The patient gave verbal permission to mail a copy of her test results.

## 2014-11-01 ENCOUNTER — Encounter: Payer: Self-pay | Admitting: Family Medicine

## 2014-11-01 DIAGNOSIS — G4733 Obstructive sleep apnea (adult) (pediatric): Secondary | ICD-10-CM | POA: Insufficient documentation

## 2014-11-14 ENCOUNTER — Emergency Department (HOSPITAL_COMMUNITY): Payer: BLUE CROSS/BLUE SHIELD

## 2014-11-14 ENCOUNTER — Encounter (HOSPITAL_COMMUNITY): Payer: Self-pay | Admitting: Emergency Medicine

## 2014-11-14 ENCOUNTER — Emergency Department (HOSPITAL_COMMUNITY)
Admission: EM | Admit: 2014-11-14 | Discharge: 2014-11-14 | Disposition: A | Discharge status: Home / Self Care | Payer: BLUE CROSS/BLUE SHIELD | Attending: Emergency Medicine | Admitting: Emergency Medicine

## 2014-11-14 DIAGNOSIS — Z7952 Long term (current) use of systemic steroids: Secondary | ICD-10-CM | POA: Insufficient documentation

## 2014-11-14 DIAGNOSIS — E079 Disorder of thyroid, unspecified: Secondary | ICD-10-CM | POA: Insufficient documentation

## 2014-11-14 DIAGNOSIS — Y998 Other external cause status: Secondary | ICD-10-CM | POA: Diagnosis not present

## 2014-11-14 DIAGNOSIS — F419 Anxiety disorder, unspecified: Secondary | ICD-10-CM | POA: Insufficient documentation

## 2014-11-14 DIAGNOSIS — Z85850 Personal history of malignant neoplasm of thyroid: Secondary | ICD-10-CM | POA: Insufficient documentation

## 2014-11-14 DIAGNOSIS — K219 Gastro-esophageal reflux disease without esophagitis: Secondary | ICD-10-CM | POA: Insufficient documentation

## 2014-11-14 DIAGNOSIS — W19XXXA Unspecified fall, initial encounter: Secondary | ICD-10-CM

## 2014-11-14 DIAGNOSIS — Z79899 Other long term (current) drug therapy: Secondary | ICD-10-CM | POA: Insufficient documentation

## 2014-11-14 DIAGNOSIS — S8991XA Unspecified injury of right lower leg, initial encounter: Secondary | ICD-10-CM | POA: Insufficient documentation

## 2014-11-14 DIAGNOSIS — S20212A Contusion of left front wall of thorax, initial encounter: Secondary | ICD-10-CM | POA: Diagnosis not present

## 2014-11-14 DIAGNOSIS — M810 Age-related osteoporosis without current pathological fracture: Secondary | ICD-10-CM | POA: Insufficient documentation

## 2014-11-14 DIAGNOSIS — M25561 Pain in right knee: Secondary | ICD-10-CM

## 2014-11-14 DIAGNOSIS — Y9289 Other specified places as the place of occurrence of the external cause: Secondary | ICD-10-CM | POA: Insufficient documentation

## 2014-11-14 DIAGNOSIS — M797 Fibromyalgia: Secondary | ICD-10-CM | POA: Insufficient documentation

## 2014-11-14 DIAGNOSIS — Y9389 Activity, other specified: Secondary | ICD-10-CM | POA: Insufficient documentation

## 2014-11-14 DIAGNOSIS — R52 Pain, unspecified: Secondary | ICD-10-CM

## 2014-11-14 DIAGNOSIS — W010XXA Fall on same level from slipping, tripping and stumbling without subsequent striking against object, initial encounter: Secondary | ICD-10-CM | POA: Diagnosis not present

## 2014-11-14 DIAGNOSIS — F329 Major depressive disorder, single episode, unspecified: Secondary | ICD-10-CM | POA: Diagnosis not present

## 2014-11-14 DIAGNOSIS — S0990XA Unspecified injury of head, initial encounter: Secondary | ICD-10-CM | POA: Insufficient documentation

## 2014-11-14 LAB — CBC
HCT: 40.1 % (ref 36.0–46.0)
Hemoglobin: 13.2 g/dL (ref 12.0–15.0)
MCH: 30 pg (ref 26.0–34.0)
MCHC: 32.9 g/dL (ref 30.0–36.0)
MCV: 91.1 fL (ref 78.0–100.0)
Platelets: 218 10*3/uL (ref 150–400)
RBC: 4.4 MIL/uL (ref 3.87–5.11)
RDW: 13.5 % (ref 11.5–15.5)
WBC: 5.3 10*3/uL (ref 4.0–10.5)

## 2014-11-14 LAB — BASIC METABOLIC PANEL
Anion gap: 7 (ref 5–15)
BUN: 15 mg/dL (ref 6–23)
CO2: 29 mmol/L (ref 19–32)
Calcium: 8.9 mg/dL (ref 8.4–10.5)
Chloride: 103 mmol/L (ref 96–112)
Creatinine, Ser: 0.65 mg/dL (ref 0.50–1.10)
GFR calc Af Amer: >90 mL/min (ref >90)
GFR calc non Af Amer: >90 mL/min (ref >90)
Glucose, Bld: 113 mg/dL — ABNORMAL HIGH (ref 70–99)
Potassium: 4.3 mmol/L (ref 3.5–5.1)
Sodium: 139 mmol/L (ref 135–145)

## 2014-11-14 LAB — CBG MONITORING, ED: Glucose-Capillary: 101 mg/dL — ABNORMAL HIGH (ref 70–99)

## 2014-11-14 MED ORDER — HYDROMORPHONE HCL 1 MG/ML IJ SOLN
1.0000 mg | Freq: Once | INTRAMUSCULAR | Status: DC
  Filled 2014-11-14: qty 1

## 2014-11-14 MED ORDER — KETOROLAC TROMETHAMINE 30 MG/ML IJ SOLN
30.0000 mg | Freq: Once | INTRAMUSCULAR | Status: AC
  Administered 2014-11-14: 30 mg via INTRAVENOUS
  Filled 2014-11-14: qty 1

## 2014-11-14 NOTE — ED Notes (Signed)
Patient with Hx of thyroid cancer c/o fall today with loss of consciousness, head injury, and left rib injury. Pt states she is unsure of whether she fell due to LOC, or had LOC due to fall, patient does not remember fall. Pt states that when she pressed on her left lateral rib, she felt/heard a "snapping" sound. Neuro exam WDL. Pt denies use of blood thinners.

## 2014-11-14 NOTE — ED Notes (Signed)
Pt alert and oriented x4. rn helped adjust triage chair for comfort.

## 2014-11-14 NOTE — Discharge Instructions (Signed)
Return to the ED with any concerns including vomiting, seizure activity, difficulty breathing, fainting, decreased level of alertness/lethargy, or any other alarming symptoms

## 2014-11-14 NOTE — ED Provider Notes (Signed)
CSN: [id number]     Arrival date & time 11/14/14  1252 History   First MD Initiated Contact with Patient 11/14/14 1410     Chief Complaint  Patient presents with  . Loss of Consciousness  . Head Injury     (Consider location/radiation/quality/duration/timing/severity/associated sxs/prior Treatment) HPI  Pt presenting with c/o pain after fall earlier today.  She states she fell, remembers all events of the fall- states she tripped over something, on the way down to the floor grabbed her glasses so as not to break them- did strike her head and developed LOC.  No vomiting or seizure activity following.  She also c/o pain in her left ribcage.  Patient states she felt a pop when she pushed on her left rib.  No difficulty breathing.  Also c/o some pain in left knee.  Has chronic pain due to fibromyalgia and left hip pain at baseline.  Was able to ambulate prior to coming to the ED.  Movement and palpation makes pain worse.  She takes dilaudid 2mg  tabs- last dose at 12:30pm- this gave her mild improvement in pain.  There are no other associated systemic symptoms, there are no other alleviating or modifying factors.   Past Medical History  Diagnosis Date  . Atrial dysrhythmia   . Fibromyalgia   . Cancer     thyroid-status post resection on replacement Dr. Chalmers Cater  . Anxiety   . Depression   . Thyroid disease   . GE reflux   . Arthritis   . Osteoporosis    Past Surgical History  Procedure Laterality Date  . Cholecystectomy    . Thyroidectomy  04/2009    papillary [place] Dr. Erik Obey  . Total hip arthroplasty  2009 and 2011    L hip   . Tubal ligation    . Abdominal hysterectomy    . Colonoscopy  04/2011  . Toe surgery      bone spur removed - rt great toe   Family History  Problem Relation Age of Onset  . Cancer Other   . Alcohol abuse Mother   . Cancer Mother 43    Lung   . Alcohol abuse Father   . Cancer Sister 3    Breast   History  Substance Use Topics  . Smoking status:  Never Smoker   . Smokeless tobacco: Never Used  . Alcohol Use: Yes     Comment: socially   OB History    No data available     Review of Systems  ROS reviewed and all otherwise negative except for mentioned in HPI    Allergies  Fentanyl; Nsaids; and Codeine  Home Medications   Prior to Admission medications   Medication Sig Start Date End Date Taking? Authorizing Provider  calcium-vitamin D (OSCAL WITH D) 500-200 MG-UNIT per tablet Take 1 tablet by mouth every morning.   Yes Historical Provider, MD  diazepam (VALIUM) 10 MG tablet Take 0.5 tablets (5 mg total) by mouth every 12 (twelve) hours as needed for anxiety. 10/17/14  Yes Dickie La, MD  flecainide (TAMBOCOR) 150 MG tablet Take 1 tablet (150 mg total) by mouth 2 (two) times daily. 04/27/14  Yes Dickie La, MD  FLUoxetine (PROZAC) 40 MG capsule Take 1 capsule (40 mg total) by mouth every morning. 04/27/14  Yes Dickie La, MD  HYDROmorphone (DILAUDID) 2 MG tablet Take 2 mg by mouth every 4 (four) hours as needed (For pain.).  09/29/14  Yes Historical Provider, MD  levothyroxine (SYNTHROID, LEVOTHROID) 150 MCG tablet Take 1 tablet (150 mcg total) by mouth daily. 04/27/14  Yes Dickie La, MD  Multiple Vitamin (MULTIVITAMIN WITH MINERALS) TABS tablet Take 1 tablet by mouth daily.   Yes Historical Provider, MD  oxybutynin (DITROPAN XL) 10 MG 24 hr tablet Take 1 tablet (10 mg total) by mouth at bedtime. 05/25/14  Yes Dickie La, MD  pantoprazole (PROTONIX) 40 MG tablet Take 1 tablet (40 mg total) by mouth 2 (two) times daily. 04/27/14  Yes Dickie La, MD  polyethylene glycol [facility] / GLYCOLAX) packet Take 17 g by mouth daily as needed (For constipation.).   Yes Historical Provider, MD  polyethylene glycol powder (GLYCOLAX/MIRALAX) powder USE 1 [place] Patient taking differently: Take 0.5 Containers by mouth daily as needed (For constipation.).  10/17/14  Yes Dickie La, MD  sodium chloride (OCEAN) 0.65 % SOLN nasal spray Place 1  spray into both nostrils as needed (For nasal dryness.).   Yes Historical Provider, MD  traZODone (DESYREL) 100 MG tablet TAKE 2 TABLETS BY MOUTH AT BEDTIME. Patient taking differently: Take 200 mg by mouth at bedtime.  04/27/14  Yes Dickie La, MD  triamcinolone cream (KENALOG) 0.1 % Apply 1 application topically 2 (two) times daily. Patient not taking: Reported on 11/14/2014 05/11/14   Dickie La, MD   BP 147/79 mmHg  Pulse 65  Temp(Src) 98 F (36.7 C) (Oral)  Resp 17  SpO2 96%  Vitals reviewed Physical Exam  Physical Examination: General appearance - alert, well appearing, and in no distress Mental status - alert, oriented to person, place, and time Eyes - pupils equal and reactive, extraocular eye movements intact Head- NCAT Mouth - mucous membranes moist, pharynx normal without lesions Neck - no midline tenderness to palpation, mild ttp in right paracervical region Chest - clear to auscultation, no wheezes, rales or rhonchi, symmetric air entry, ttp over left rib cage at midaxillary line- no crepitus, no overlying bruising Heart - normal rate, regular rhythm, normal S1, S2, no murmurs, rubs, clicks or gallops Abdomen - soft, nontender, nondistended, no masses or organomegaly Back exam - full range of motion, no tenderness, palpable spasm or pain on motion Neurological - alert, oriented, normal speech, no focal findings or movement disorder noted Musculoskeletal - ttp over anterior left knee, some pain with ROm, otherwise no joint tenderness, deformity or swelling Extremities - peripheral pulses normal, no pedal edema, no clubbing or cyanosis Skin - normal coloration and turgor, no rashes  ED Course  Procedures (including critical care time) Labs Review Labs Reviewed  BASIC METABOLIC PANEL - Abnormal; Notable for the following:    Glucose, Bld 113 (*)    All other components within normal limits  CBG MONITORING, ED - Abnormal; Notable for the following:    Glucose-Capillary 101  (*)    All other components within normal limits  CBC    Imaging Review No results found.   EKG Interpretation   Date/Time:  Monday November 14 2014 13:03:02 EST Ventricular Rate:  67 PR Interval:  193 QRS Duration: 112 QT Interval:  461 QTC Calculation: 487 R Axis:   -32 Text Interpretation:  Sinus rhythm Ventricular premature complex  Borderline IVCD with LAD Low voltage, precordial leads Borderline  repolarization abnormality Borderline prolonged QT interval Baseline  wander in lead(s) I V3 V6 Since previous tracing QT is longer but  borderline lengthening overall Confirmed by Canary Brim  MD, MARTHA [phone]) on  11/14/2014 2:32:07 PM  MDM   Final diagnoses:  Fall, initial encounter  Head injury, initial encounter  Rib contusion, left, initial encounter  Knee pain, acute, right    Pt presenting after fall- pt sustained head injury with brief LOC- she is now back to her baseline.  Pain in left ribs, left knee.  Xrays and CT scan are reassuring.  Pt treated with additional dilaudid to her home dose.  Discharged with strict return precautions.  Pt agreeable with plan.    Threasa Beards, MD 11/16/14 [phone]

## 2014-11-15 ENCOUNTER — Telehealth: Payer: Self-pay | Admitting: Neurology

## 2014-11-15 NOTE — Telephone Encounter (Signed)
Pt is calling stating she was seen in the ER yesterday 11/14/14 due to falling and has a concussion.  She states she has been falling a lot.  She still has a lack of balance and she's not to come back in no time soon and she needs to know what she needs to do.  Please call and advise.

## 2014-11-16 ENCOUNTER — Other Ambulatory Visit: Payer: Self-pay | Admitting: Neurology

## 2014-11-16 DIAGNOSIS — R269 Unspecified abnormalities of gait and mobility: Secondary | ICD-10-CM

## 2014-11-16 DIAGNOSIS — W19XXXA Unspecified fall, initial encounter: Secondary | ICD-10-CM

## 2014-11-16 NOTE — Telephone Encounter (Signed)
Patient stopped by the office today upset that she did not get a call back yesterday.  Continues to fall.  Scheduled to see Dr. Jaynee Eagles tomorrow afternoon but would like a call back today.

## 2014-11-16 NOTE — Telephone Encounter (Signed)
Spoke to patient and have ordered physical therapy. She is happy, she doesn't need to come in tomorrow. Rebecca Marshall can you cancel the appointment? Thank you.

## 2014-11-17 ENCOUNTER — Ambulatory Visit: Payer: BLUE CROSS/BLUE SHIELD | Admitting: Neurology

## 2014-12-01 ENCOUNTER — Encounter: Payer: Self-pay | Admitting: Family Medicine

## 2014-12-01 ENCOUNTER — Telehealth: Payer: Self-pay | Admitting: Family Medicine

## 2014-12-01 NOTE — Telephone Encounter (Signed)
Is still having falls Should she get a referral to ENT or should she see Dr Nori Riis again? Please advise

## 2014-12-02 NOTE — Telephone Encounter (Signed)
 -----   Message from [place]. Laurance Flatten to Dickie La, MD sent at 12/01/2014 6:55 PM -----     I fell again today. Right on the L hip that has been replaced twice. knees hurt, small lump on head. I was in ED recently for another fall that gave me a concussion and bruised ribs. Another fall was late last year for concussion.    I have been to Dr. Lavell Anchors- neuro, & she says the MRI is fine. negative eeg.    I attend PT for balance exercises. I am still tipping over & using walls for support. I walk wide legged & shuffle to prevent falls. I have gotten rid of both pairs of slippers & plan to walk barefoot.        Do I need to see you or an ENT? maybe my ears have a problem. I have several near falls weekly and actual falls more & more frequently.         Since neuro has no more answers, what do you think?

## 2014-12-02 NOTE — Telephone Encounter (Signed)
Dear Rebecca Marshall Team I should probably see her back in my office See if you can make appt for her [facility]! Rebecca Marshall

## 2014-12-05 NOTE — Telephone Encounter (Signed)
LVM for patient to call back. ?

## 2014-12-07 ENCOUNTER — Encounter: Payer: Self-pay | Admitting: Family Medicine

## 2014-12-07 ENCOUNTER — Ambulatory Visit (INDEPENDENT_AMBULATORY_CARE_PROVIDER_SITE_OTHER): Payer: BLUE CROSS/BLUE SHIELD | Admitting: Family Medicine

## 2014-12-07 VITALS — BP 129/70 | HR 74 | Wt 221.0 lb

## 2014-12-07 DIAGNOSIS — R296 Repeated falls: Secondary | ICD-10-CM

## 2014-12-07 DIAGNOSIS — F32A Depression, unspecified: Secondary | ICD-10-CM

## 2014-12-07 DIAGNOSIS — F329 Major depressive disorder, single episode, unspecified: Secondary | ICD-10-CM

## 2014-12-07 NOTE — Telephone Encounter (Signed)
Patient seen today by Dr. Nori Riis and was referred back to Dr. Erik Obey

## 2014-12-09 ENCOUNTER — Encounter: Payer: Self-pay | Admitting: Neurology

## 2014-12-09 NOTE — Progress Notes (Signed)
   Subjective:    Patient ID: Rebecca Marshall, female    DOB: May 24, 1951, 64 y.o.   MRN: [id number]  HPI She is here to discuss possible referral to ENT. I had seen her a few weeks ago with complain of some cognitive fuzziness at the time she had a couple falls. Since then she's had a couple more. She feels somewhat unbalanced and occasionally will veer toward a wall when she's walking. We had sent her to the neurologist and she had essentially normal workup except for some diagnosis of obstructive sleep apnea. Since she started CPAP she's felt much better with her cognitive fuzziness the continues to have some issues with falls. She is undergoing gait training therapy with the physical therapist right now and they suggested she may have some this tubular issues and it they recommended she follow up with an ENT.  #2. She would like to discuss potentially weaning off her SSRI. Since she got married she says things have been white good in her life she's not sure she needs continued treatment for chronic depression. Review of Systems Unsteadiness as per history of present illness. She has chronic hip pain which is essentially unchanged. She's had no blurred vision or double vision. No fever, sweats, chills, unusual weight change. Her mood has been good for her without any sign of depression. Denies hallucination. No suicidal or homicidal ideation.    Objective:   Physical Exam Vital signs are reviewed GEN.: Well-developed female no acute distress  HEENT: Pupils equal round reactive to light accommodation extraocular muscles are intact. Sclerae without any sign of icterus or hemorrhage. Oropharynx is clear with a sign of exudate. Mucous membranes are very slightly dry. Neck has full range of motion. There is no lymphadenopathy in the neck. No carotid bruits. No thyromegaly. No JVD. PSYCH: Alert and oriented 4. Affect is interactive. She asks and answers questions appropriately. No agitation and no  psychomotor retardation.       Assessment & Plan:  #1. Falls. I really doubt there is a vestibular component to this but the issue has been raised by her physical therapist so we will send her to ENT. #2. Depression. I would like to taper her down to 20 mg of fluoxetine rather than to stop abruptly especially given her history of falls. I'll follow her up for this change in medication in 4-6 weeks.

## 2014-12-10 ENCOUNTER — Encounter: Payer: Self-pay | Admitting: Family Medicine

## 2014-12-12 NOTE — Telephone Encounter (Signed)
 -----   Message from [place]. Grabert to Dickie La, MD sent at 12/10/2014 3:56 PM -----     Thank you for the referral to Dr. Erik Obey. However you wrote that I drink- none since 11/15 since I started narcotics. You also wrote that I use illicit drugs. Not true. Last try of pot was in high school a few times. I have never tried anything else. Could you send him a note fixing these two things?        I saw him with my husband yesterday for Roy's issues. He told me he did not think there was anything he could do about my falls and advised to see neurologist again.        If my falls are from narcotics, I am willing to try non-narcotic means first. I will discuss this again at my next visit with Dr. Hardin Negus. I have not had good responses to meds other than narcotics. But I do not want to fall & break a bone.        When I am in a lot of pain, I cannot do anything. With the dilaudid 2mg  q4hr, I function and the pain is lower. I usually take it 3 times a day. Occasionally 4 times a day.        Thanks for your help.    Rebecca Marshall

## 2014-12-14 ENCOUNTER — Encounter: Payer: Self-pay | Admitting: Family Medicine

## 2014-12-26 ENCOUNTER — Ambulatory Visit: Payer: BLUE CROSS/BLUE SHIELD | Admitting: Psychology

## 2015-01-30 ENCOUNTER — Ambulatory Visit (INDEPENDENT_AMBULATORY_CARE_PROVIDER_SITE_OTHER): Payer: BLUE CROSS/BLUE SHIELD | Admitting: Neurology

## 2015-01-30 ENCOUNTER — Encounter: Payer: Self-pay | Admitting: Neurology

## 2015-01-30 VITALS — BP 102/74 | HR 72 | Resp 18

## 2015-01-30 DIAGNOSIS — R0902 Hypoxemia: Secondary | ICD-10-CM

## 2015-01-30 DIAGNOSIS — Z9989 Dependence on other enabling machines and devices: Secondary | ICD-10-CM

## 2015-01-30 DIAGNOSIS — R0683 Snoring: Secondary | ICD-10-CM | POA: Insufficient documentation

## 2015-01-30 DIAGNOSIS — G4733 Obstructive sleep apnea (adult) (pediatric): Secondary | ICD-10-CM | POA: Diagnosis not present

## 2015-01-30 DIAGNOSIS — R351 Nocturia: Secondary | ICD-10-CM | POA: Diagnosis not present

## 2015-01-30 HISTORY — DX: Hypoxemia: R09.02

## 2015-01-30 HISTORY — DX: Snoring: R06.83

## 2015-01-30 HISTORY — DX: Obstructive sleep apnea (adult) (pediatric): G47.33

## 2015-01-30 HISTORY — DX: Morbid (severe) obesity due to excess calories: E66.01

## 2015-01-30 HISTORY — DX: Dependence on other enabling machines and devices: Z99.89

## 2015-01-30 HISTORY — DX: Nocturia: R35.1

## 2015-01-30 NOTE — Progress Notes (Signed)
SLEEP MEDICINE CLINIC   Provider:  Larey Seat, M D  Referring Provider: Dickie La, MD Primary Care Physician:  Dorcas Mcmurray, MD  Chief Complaint  Patient presents with  . 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 Jaynee Eagles and Dr. Nori Riis for a sleep consultation,  Rebecca Marshall was seen on 1-30 1-16 upon referral of Dr. Read Drivers and mycotic Sarina Ill for possible sleep apnea. At the time she had endorsed a history of thyroid cancer, depression, cardiac arrhythmias, memory loss, dysarthria, GERD chronic pain and had reported sleep complaints. Trouble sleeping at night trouble sleeping through the night waking up this morning headaches nocturia and excessive daytime sleepiness all under the umbrella of witnessed loud snoring. She has also endorsed the Epworth score at 14 points at the time. During the sleep study and AHI of 20.2 was identified is moderate apnea was a however accentuated by significant desaturation. Her lowest oxygen level at night was 83% of normal but she remained in hypoxemia 419.4 minutes doing the first barely 2 hours of the study. The average heart rate by the way was regular in sinus rhythm at 67 bpm she did not have periodic limb movements. She was titrated to CPAP at 4 cm water advance to 8 cm water the total recording time of 271 minutes with a total sleep time of 197 minutes. Her RDI became 0 at 8 cm water was 1 cm flex. During the titration study in our lab, a pico respiratory Respironics mask was used,  in medium - large size.  humidity has been added. The patient is here for the first personal encounter with me, 01-30-15.  Patient greeted me stating that she has been never sleeping as well and she loves her machine. Her current Epworth sleepiness score was endorsed at one point and her fatigue score at 23 points the patient has used the machine compliantly a download over 30 days revealed 90%  compliance for 30 days the patient uses a machine 7 hours and 34 minutes on average set pressure is 8 cm water was 1 cm EPR and the events per hour residual AHI were 1.6 this is an excellent results.  Occasionally Rebecca Marshall reports having rhinitis or a stuffy nose which can hinder her to use her CPAP. At this has only affected her one night out of the last 30. Is taking Zyrtec daily. Rebecca Marshall also reports a regular sleep habits. She usually goes to bed around 9:30 PM and now falls asleep fairly promptly she also sleeps better through the night more soundly. She does dream but she does recall only some of them. She has still nocturia and enuresis and she read times at night and she is using a pad. This has not changed with the CPAP use. Had a back surgery December of last year and she is taking oxybutynin and she basically feels that some the nocturia is not getting better. She has woken up ; still snoring on her CPAP , if it gets dislodged or when she sleeping on her back. The supine sleep position is the only one she can resume due to her orthopedic problems. She feels she sleeps sound and has refreshing sleep for 8-10 hours. She wakes up spontaneously, takes breakfast at home, is disabled, she as one cup of non-caffeine Coffee at home. No soda, she adheres  to water and a vegetarian diet. She walks every day. She walks 6 miles.       Review of Systems: Out of a complete 14 system review, the patient complains of only the following symptoms, and all other reviewed systems are negative. The patient reports some residual snoring but her fatigue and her daytime sleepiness have been much reduced. Her pre-study Epworth sleepiness score was 14 points and is now 1 and her fatigue severity score is now 23. Her depression inventory was endorsed at 7 points prior to the sleep study  Epworth score 1 , Fatigue severity score 23  , depression score 3 , better when exercising.   History   Social History  .  Marital Status: Married    Spouse Name: [name]  . Number of Children: 3  . Years of Education: 12+   Occupational History  . unemployed     Social History Main Topics  . Smoking status: Never Smoker   . Smokeless tobacco: Never Used  . Alcohol Use: No     Comment: none  . Drug Use: No  . Sexual Activity: Not on file   Other Topics Concern  . Not on file   Social History Narrative   Patient lives at home alone    Patient is married    Patient is not working    Patient has 3 children    Patient has her BSN RN   Patient is right handed       Daily caffeine use 1 cup daily.    Family History  Problem Relation Age of Onset  . Cancer Other   . Alcohol abuse Mother   . Cancer Mother 56    Lung   . Alcohol abuse Father   . Cancer Sister 33    Breast    Past Medical History  Diagnosis Date  . Atrial dysrhythmia   . Fibromyalgia   . Cancer     thyroid-status post resection on replacement Dr. Chalmers Cater  . Anxiety   . Depression   . Thyroid disease   . GE reflux   . Arthritis   . Osteoporosis     Past Surgical History  Procedure Laterality Date  . Cholecystectomy    . Thyroidectomy  04/2009    papillary carcimao Dr. Erik Obey  . Total hip arthroplasty  2009 and 2011    L hip   . Tubal ligation    . Abdominal hysterectomy    . Colonoscopy  04/2011  . Toe surgery      bone spur removed - rt great toe    Current Outpatient Prescriptions  Medication Sig Dispense Refill  . calcium-vitamin D (OSCAL WITH D) 500-200 MG-UNIT per tablet Take 1 tablet by mouth every morning.    . diazepam (VALIUM) 10 MG tablet Take 0.5 tablets (5 mg total) by mouth every 12 (twelve) hours as needed for anxiety. 30 tablet 4  . flecainide (TAMBOCOR) 150 MG tablet Take 1 tablet (150 mg total) by mouth 2 (two) times daily. 180 tablet 3  . FLUoxetine (PROZAC) 20 MG tablet Take 1 tablet (20 mg total) by mouth daily. 30 tablet 3  . HYDROmorphone (DILAUDID) 2 MG tablet Take 2 mg by mouth every 4  (four) hours as needed. Per pain clinic    . levothyroxine (SYNTHROID, LEVOTHROID) 150 MCG tablet Take 1 tablet (150 mcg total) by mouth daily. 90 tablet 3  . Multiple Vitamin (MULTIVITAMIN WITH MINERALS) TABS tablet Take 1 tablet by mouth daily.    Marland Kitchen  oxybutynin (DITROPAN XL) 10 MG 24 hr tablet Take 1 tablet (10 mg total) by mouth at bedtime. 90 tablet 3  . pantoprazole (PROTONIX) 40 MG tablet Take 1 tablet (40 mg total) by mouth 2 (two) times daily. 180 tablet 3  . polyethylene glycol powder (GLYCOLAX/MIRALAX) powder USE 1 CAPFUL ONCE DAILY (Patient taking differently: Take 0.5 Containers by mouth daily as needed (For constipation.). ) 1581 g 3  . sodium chloride (OCEAN) 0.65 % SOLN nasal spray Place 1 spray into both nostrils as needed (For nasal dryness.).    Marland Kitchen traZODone (DESYREL) 100 MG tablet TAKE 2 TABLETS BY MOUTH AT BEDTIME. (Patient taking differently: Take 200 mg by mouth at bedtime. ) 180 tablet 3  . triamcinolone cream (KENALOG) 0.1 % Apply 1 application topically 2 (two) times daily. 30 g 0  . polyethylene glycol (MIRALAX / GLYCOLAX) packet Take 17 g by mouth daily as needed (For constipation.).     No current facility-administered medications for this visit.    Allergies as of 01/30/2015 - Review Complete 01/30/2015  Allergen Reaction Noted  . Fentanyl Anaphylaxis 09/25/2012  . Nsaids Other (See Comments) 11/14/2014  . Codeine Nausea And Vomiting 09/25/2012    Vitals: BP 102/74 mmHg  Pulse 72  Resp 18 Last Weight:  Wt Readings from Last 1 Encounters:  12/07/14 221 lb (100.245 kg)       Last Height:   Ht Readings from Last 1 Encounters:  10/12/14 5\' 8"  (1.727 m)    Physical exam:  General: The patient is awake, alert and appears not in acute distress. The patient is well groomed. Head: Normocephalic, atraumatic. Neck is supple. Mallampati3-4 ,  neck circumference:15 . Nasal airflow restricted  TMJ is  evident . Retrognathia is seen. She wears retainers.    Cardiovascular:  Regular rate and rhythm , without  murmurs or carotid bruit, and without distended neck veins. Respiratory: Lungs are clear to auscultation. Skin:  Without evidence of edema, or rash Trunk: BMI is  elevated and patient has normal posture.  Neurologic exam : The patient is awake and alert, oriented to place and time.   Memory subjective  described as intact. There is a normal attention span & concentration ability.  Speech is fluent with a lisp, dysarthria. Mood and affect are appropriate.  Cranial nerves: Pupils are equal and briskly reactive to light. Funduscopic exam without evidence of pallor or edema.  Extraocular movements  in vertical and horizontal planes intact and without nystagmus. Visual fields by finger perimetry are intact. Hearing to finger rub intact.  Facial sensation intact to fine touch. Facial motor strength is symmetric and tongue and uvula move midline.  Motor exam: Normal tone, muscle bulk and symmetric, strength in all extremities.  Sensory:  Fine touch, pinprick and vibration were tested in all extremities. Proprioception is tested in the upper extremities only. This was  normal.  Coordination: Rapid alternating movements in the fingers/hands is normal.  Finger-to-nose maneuver  normal with evidence of  tremor.  Gait and station: Patient walks without assistive device and is able unassisted to climb up to the exam table.  Strength within normal limits. Stance is stable and normal. Tandem gait is unfragmented.   Deep tendon reflexes: in the  upper and lower extremities are symmetric and intact. Babinski maneuver response is downgoing.   Assessment:  After physical and neurologic examination, review of laboratory studies, imaging, neurophysiology testing and pre-existing records, assessment is   1) essential tremor, runs in her family .  2) OSA moderate degree, well treated on CPAP.  Risk factor for the patient's obstructive sleep apnea are  partially her narrow upper airway she has a Mallampati grade 3 her borderline neck circumference of 16 inches but also her BMI. The patient struggles with weight because she is not exposed she was not able to exercise for a long time she sexually consolidating her weight and I think she has gained quite a bit of muscle mass in comparison to some of her previous physician's description. Based on this I think she is perfectly at the right spot was heard CPAP I would be offering her a higher pressure if she desires a little bit more air.    The patient was advised of the nature of the diagnosed sleep disorder , the treatment options and risks for general a health and wellness arising from not treating the condition. More than 50% of our visit today is for discussion information and explanation of sleep apnea its risk factors and the risk factors arising from the untreated condition. The patient's hypoxemia associated with a rather moderate degree of apnea made CPAP necessary. A dental device or ENT procedure would have not treated this particular form of apnea. Visit duration was 30 minutes.   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     Asencion Partridge Yaslin Kirtley MD  01/30/2015

## 2015-02-28 ENCOUNTER — Other Ambulatory Visit: Payer: Self-pay | Admitting: *Deleted

## 2015-02-28 MED ORDER — FLUOXETINE HCL 20 MG PO TABS: 20.0000 mg | ORAL_TABLET | Freq: Every day | ORAL | Status: DC

## 2015-03-02 ENCOUNTER — Telehealth: Payer: Self-pay | Admitting: Internal Medicine

## 2015-03-02 NOTE — Telephone Encounter (Signed)
New problem   Pt is having fatigue and low heart rate. Pt want to speak to nurse.

## 2015-03-09 NOTE — Telephone Encounter (Signed)
Started off conversation apologizing for no one following up with patient (as I have been out of the office sick).  She states that it was ok. Then we discussed what she called in about last week.  She tells me that she is having dizziness/light-headedness fairly often.  Reports HRs in the 50s. Patient agreeable to come in 7/01 at 8:15 a.m to see Dr. Caryl Comes to discuss issues.

## 2015-03-19 ENCOUNTER — Emergency Department (HOSPITAL_COMMUNITY)
Admission: EM | Admit: 2015-03-19 | Discharge: 2015-03-19 | Disposition: A | Discharge status: Home / Self Care | Payer: BLUE CROSS/BLUE SHIELD | Attending: Emergency Medicine | Admitting: Emergency Medicine

## 2015-03-19 ENCOUNTER — Encounter (HOSPITAL_COMMUNITY): Payer: Self-pay | Admitting: Family Medicine

## 2015-03-19 ENCOUNTER — Emergency Department (HOSPITAL_COMMUNITY): Payer: BLUE CROSS/BLUE SHIELD

## 2015-03-19 ENCOUNTER — Other Ambulatory Visit: Payer: Self-pay | Admitting: Family Medicine

## 2015-03-19 DIAGNOSIS — R197 Diarrhea, unspecified: Secondary | ICD-10-CM | POA: Diagnosis not present

## 2015-03-19 DIAGNOSIS — H538 Other visual disturbances: Secondary | ICD-10-CM | POA: Insufficient documentation

## 2015-03-19 DIAGNOSIS — R51 Headache: Secondary | ICD-10-CM | POA: Insufficient documentation

## 2015-03-19 DIAGNOSIS — M810 Age-related osteoporosis without current pathological fracture: Secondary | ICD-10-CM | POA: Insufficient documentation

## 2015-03-19 DIAGNOSIS — K219 Gastro-esophageal reflux disease without esophagitis: Secondary | ICD-10-CM | POA: Diagnosis not present

## 2015-03-19 DIAGNOSIS — H5710 Ocular pain, unspecified eye: Secondary | ICD-10-CM | POA: Insufficient documentation

## 2015-03-19 DIAGNOSIS — F419 Anxiety disorder, unspecified: Secondary | ICD-10-CM | POA: Insufficient documentation

## 2015-03-19 DIAGNOSIS — Z85850 Personal history of malignant neoplasm of thyroid: Secondary | ICD-10-CM | POA: Insufficient documentation

## 2015-03-19 DIAGNOSIS — Z79899 Other long term (current) drug therapy: Secondary | ICD-10-CM | POA: Diagnosis not present

## 2015-03-19 DIAGNOSIS — F329 Major depressive disorder, single episode, unspecified: Secondary | ICD-10-CM | POA: Diagnosis not present

## 2015-03-19 DIAGNOSIS — E079 Disorder of thyroid, unspecified: Secondary | ICD-10-CM | POA: Diagnosis not present

## 2015-03-19 DIAGNOSIS — R112 Nausea with vomiting, unspecified: Secondary | ICD-10-CM

## 2015-03-19 DIAGNOSIS — M797 Fibromyalgia: Secondary | ICD-10-CM | POA: Diagnosis not present

## 2015-03-19 DIAGNOSIS — Z7952 Long term (current) use of systemic steroids: Secondary | ICD-10-CM | POA: Diagnosis not present

## 2015-03-19 DIAGNOSIS — R519 Headache, unspecified: Secondary | ICD-10-CM

## 2015-03-19 LAB — COMPREHENSIVE METABOLIC PANEL
ALT: 21 U/L (ref 14–54)
AST: 41 U/L (ref 15–41)
Albumin: 4.3 g/dL (ref 3.5–5.0)
Alkaline Phosphatase: 69 U/L (ref 38–126)
Anion gap: 10 (ref 5–15)
BUN: 13 mg/dL (ref 6–20)
CO2: 22 mmol/L (ref 22–32)
Calcium: 9.1 mg/dL (ref 8.9–10.3)
Chloride: 104 mmol/L (ref 101–111)
Creatinine, Ser: 0.76 mg/dL (ref 0.44–1.00)
GFR calc Af Amer: >60 mL/min (ref >60)
GFR calc non Af Amer: >60 mL/min (ref >60)
Glucose, Bld: 120 mg/dL — ABNORMAL HIGH (ref 65–99)
Potassium: 4.3 mmol/L (ref 3.5–5.1)
Sodium: 136 mmol/L (ref 135–145)
Total Bilirubin: 1.1 mg/dL (ref 0.3–1.2)
Total Protein: 8 g/dL (ref 6.5–8.1)

## 2015-03-19 LAB — CBC WITH DIFFERENTIAL/PLATELET
Basophils Absolute: 0 10*3/uL (ref 0.0–0.1)
Basophils Relative: 0 % (ref 0–1)
Eosinophils Absolute: 0 10*3/uL (ref 0.0–0.7)
Eosinophils Relative: 0 % (ref 0–5)
HCT: 42.8 % (ref 36.0–46.0)
Hemoglobin: 14.7 g/dL (ref 12.0–15.0)
Lymphocytes Relative: 17 % (ref 12–46)
Lymphs Abs: 1.2 10*3/uL (ref 0.7–4.0)
MCH: 30.2 pg (ref 26.0–34.0)
MCHC: 34.3 g/dL (ref 30.0–36.0)
MCV: 87.9 fL (ref 78.0–100.0)
Monocytes Absolute: 0.4 10*3/uL (ref 0.1–1.0)
Monocytes Relative: 6 % (ref 3–12)
Neutro Abs: 5.4 10*3/uL (ref 1.7–7.7)
Neutrophils Relative %: 77 % (ref 43–77)
Platelets: 278 10*3/uL (ref 150–400)
RBC: 4.87 MIL/uL (ref 3.87–5.11)
RDW: 13.7 % (ref 11.5–15.5)
WBC: 6.9 10*3/uL (ref 4.0–10.5)

## 2015-03-19 LAB — URINALYSIS, ROUTINE W REFLEX MICROSCOPIC
Bilirubin Urine: NEGATIVE
Glucose, UA: NEGATIVE mg/dL
Ketones, ur: NEGATIVE mg/dL
Nitrite: NEGATIVE
Protein, ur: NEGATIVE mg/dL
Specific Gravity, Urine: 1.008 (ref 1.005–1.030)
Urobilinogen, UA: 0.2 mg/dL (ref 0.0–1.0)
pH: 8.5 — ABNORMAL HIGH (ref 5.0–8.0)

## 2015-03-19 LAB — URINE MICROSCOPIC-ADD ON

## 2015-03-19 MED ORDER — SODIUM CHLORIDE 0.9 % IV BOLUS (SEPSIS)
1000.0000 mL | Freq: Once | INTRAVENOUS | Status: AC
  Administered 2015-03-19: 1000 mL via INTRAVENOUS

## 2015-03-19 MED ORDER — DEXAMETHASONE SODIUM PHOSPHATE 10 MG/ML IJ SOLN
10.0000 mg | Freq: Once | INTRAMUSCULAR | Status: AC
  Administered 2015-03-19: 10 mg via INTRAVENOUS
  Filled 2015-03-19: qty 1

## 2015-03-19 MED ORDER — HYDROMORPHONE HCL 1 MG/ML IJ SOLN
1.0000 mg | Freq: Once | INTRAMUSCULAR | Status: AC
  Administered 2015-03-19: 1 mg via INTRAVENOUS
  Filled 2015-03-19: qty 1

## 2015-03-19 MED ORDER — DIPHENHYDRAMINE HCL 50 MG/ML IJ SOLN
25.0000 mg | Freq: Once | INTRAMUSCULAR | Status: AC
  Administered 2015-03-19: 25 mg via INTRAVENOUS
  Filled 2015-03-19: qty 1

## 2015-03-19 MED ORDER — ONDANSETRON 4 MG PO TBDP: ORAL_TABLET | ORAL | Status: DC

## 2015-03-19 MED ORDER — SODIUM CHLORIDE 0.9 % IV SOLN
Freq: Once | INTRAVENOUS | Status: AC
  Administered 2015-03-19: 22:00:00 via INTRAVENOUS

## 2015-03-19 MED ORDER — METOCLOPRAMIDE HCL 5 MG/ML IJ SOLN
10.0000 mg | Freq: Once | INTRAMUSCULAR | Status: AC
Start: 2015-03-19 — End: 2015-03-19
  Administered 2015-03-19: 10 mg via INTRAVENOUS
  Filled 2015-03-19: qty 2

## 2015-03-19 NOTE — ED Notes (Signed)
Patients reports she is complaining of right eye pain with a migraine, diarrhea, nausea, and vomiting. Pt states the eye pain started about 1 month ago but has got worse over the last 2 weeks. Pt states the diarrhea started last night and N/V started this morning. Pt took last dose of Hydromorphone 2mg  PO at 18:30 for pain.

## 2015-03-19 NOTE — ED Notes (Signed)
Urine sample collected

## 2015-03-19 NOTE — ED Provider Notes (Signed)
CSN: [id number]     Arrival date & time 03/19/15  1911 History   First MD Initiated Contact with Patient 03/19/15 2106     Chief Complaint  Patient presents with  . Diarrhea  . Emesis  . Headache  . Eye Pain     (Consider location/radiation/quality/duration/timing/severity/associated sxs/prior Treatment) Patient is a 64 y.o. female presenting with diarrhea, vomiting, headaches, and eye pain. The history is provided by the patient.  Diarrhea Quality:  Watery Severity:  Moderate Onset quality:  Sudden Duration:  1 day Timing:  Constant Progression:  Unchanged Relieved by:  Nothing Worsened by:  Nothing tried Ineffective treatments:  None tried Associated symptoms: headaches and vomiting   Associated symptoms: no abdominal pain and no fever   Emesis Associated symptoms: diarrhea and headaches   Associated symptoms: no abdominal pain   Headache Location: right retro-orbital. Quality:  Sharp Severity currently:  9/10 Severity at highest:  10/10 Onset quality:  Gradual Duration:  12 hours Timing:  Constant Progression:  Improving Chronicity:  Chronic Similar to prior headaches: yes   Context comment:  Awoke w/ ha Relieved by:  Nothing Worsened by:  Nothing Ineffective treatments: maxalt, dilaudid. Associated symptoms: diarrhea, eye pain, nausea and vomiting   Associated symptoms: no abdominal pain, no back pain, no congestion, no cough, no dizziness, no fatigue, no fever and no neck pain   Eye Pain Associated symptoms include headaches. Pertinent negatives include no chest pain, no abdominal pain and no shortness of breath.    Past Medical History  Diagnosis Date  . Atrial dysrhythmia   . Fibromyalgia   . Cancer     thyroid-status post resection on replacement Dr. Chalmers Cater  . Anxiety   . Depression   . Thyroid disease   . GE reflux   . Arthritis   . Osteoporosis    Past Surgical History  Procedure Laterality Date  . Cholecystectomy    . Thyroidectomy  04/2009     papillary carcimao Dr. Erik Obey  . Total hip arthroplasty  2009 and 2011    L hip   . Tubal ligation    . Abdominal hysterectomy    . Colonoscopy  04/2011  . Toe surgery      bone spur removed - rt great toe   Family History  Problem Relation Age of Onset  . Cancer Other   . Alcohol abuse Mother   . Cancer Mother 69    Lung   . Alcohol abuse Father   . Cancer Sister 8    Breast   History  Substance Use Topics  . Smoking status: Never Smoker   . Smokeless tobacco: Never Used  . Alcohol Use: No     Comment: none   OB History    No data available     Review of Systems  Constitutional: Negative for fever and fatigue.  HENT: Negative for congestion and drooling.   Eyes: Positive for pain and visual disturbance.  Respiratory: Negative for cough and shortness of breath.   Cardiovascular: Negative for chest pain.  Gastrointestinal: Positive for nausea, vomiting and diarrhea. Negative for abdominal pain.  Genitourinary: Negative for dysuria and hematuria.  Musculoskeletal: Negative for back pain, gait problem and neck pain.  Skin: Negative for color change.  Neurological: Positive for headaches. Negative for dizziness.  Hematological: Negative for adenopathy.  Psychiatric/Behavioral: Negative for behavioral problems.  All other systems reviewed and are negative.     Allergies  Fentanyl; Nsaids; and Codeine  Home Medications  Prior to Admission medications   Medication Sig Start Date End Date Taking? Authorizing Provider  calcium-vitamin D (OSCAL WITH D) 500-200 MG-UNIT per tablet Take 1 tablet by mouth every morning.   Yes Historical Provider, MD  diazepam (VALIUM) 10 MG tablet Take 0.5 tablets (5 mg total) by mouth every 12 (twelve) hours as needed for anxiety. 10/17/14  Yes Dickie La, MD  flecainide (TAMBOCOR) 150 MG tablet Take 1 tablet (150 mg total) by mouth 2 (two) times daily. 04/27/14  Yes Dickie La, MD  FLUoxetine (PROZAC) 20 MG tablet Take 1 tablet (20  mg total) by mouth daily. 02/28/15  Yes Dickie La, MD  HYDROmorphone (DILAUDID) 2 MG tablet Take 2 mg by mouth every 4 (four) hours as needed. Per pain clinic 09/29/14  Yes Historical Provider, MD  levothyroxine (SYNTHROID, LEVOTHROID) 150 MCG tablet Take 1 tablet (150 mcg total) by mouth daily. 04/27/14  Yes Dickie La, MD  oxybutynin (DITROPAN XL) 10 MG 24 hr tablet Take 1 tablet (10 mg total) by mouth at bedtime. 05/25/14  Yes Dickie La, MD  pantoprazole (PROTONIX) 40 MG tablet Take 1 tablet (40 mg total) by mouth 2 (two) times daily. 04/27/14  Yes Dickie La, MD  sodium chloride (OCEAN) 0.65 % SOLN nasal spray Place 1 spray into both nostrils as needed (For nasal dryness.).   Yes Historical Provider, MD  traZODone (DESYREL) 100 MG tablet TAKE 2 TABLETS BY MOUTH AT BEDTIME. Patient taking differently: Take 200 mg by mouth at bedtime.  04/27/14  Yes Dickie La, MD  polyethylene glycol powder [facility]) powder USE 1 CAPFUL ONCE DAILY Patient not taking: Reported on 03/19/2015 10/17/14   Dickie La, MD  triamcinolone cream (KENALOG) 0.1 % Apply 1 application topically 2 (two) times daily. Patient taking differently: Apply 1 application topically 2 (two) times daily as needed (ezcema).  05/11/14   Dickie La, MD   BP 136/79 mmHg  Pulse 72  Temp(Src) 99.5 F (37.5 C) (Oral)  Resp 24  Ht 5\' 8"  (1.727 m)  Wt 210 lb (95.255 kg)  BMI 31.94 kg/m2  SpO2 100% Physical Exam  Constitutional: She is oriented to person, place, and time. She appears well-developed and well-nourished.  HENT:  Head: Normocephalic.  Mouth/Throat: Oropharynx is clear and moist. No oropharyngeal exudate.  Eyes: Conjunctivae and EOM are normal. Pupils are equal, round, and reactive to light.  20/15 vision in the right eye, 20/20 vision in the left eye. (with glasses)  Neck: Normal range of motion. Neck supple.  Cardiovascular: Normal rate, regular rhythm, normal heart sounds and intact distal pulses.  Exam reveals no  gallop and no friction rub.   No murmur heard. Pulmonary/Chest: Effort normal and breath sounds normal. No respiratory distress. She has no wheezes.  Abdominal: Soft. Bowel sounds are normal. There is no tenderness. There is no rebound and no guarding.  Musculoskeletal: Normal range of motion. She exhibits no edema or tenderness.  Neurological: She is alert and oriented to person, place, and time.  alert, oriented x3 speech: normal in context and clarity memory: intact grossly cranial nerves II-XII: intact motor strength: full proximally and distally Mild intention tremor in UE's sensation: intact to light touch diffusely  cerebellar: finger-to-nose and heel-to-shin intact gait: normal forwards and backwards  Skin: Skin is warm and dry.  Psychiatric: She has a normal mood and affect. Her behavior is normal.  Nursing note and vitals reviewed.   ED Course  Procedures (  including critical care time) Labs Review Labs Reviewed  COMPREHENSIVE METABOLIC PANEL - Abnormal; Notable for the following:    Glucose, Bld 120 (*)    All other components within normal limits  URINALYSIS, ROUTINE W REFLEX MICROSCOPIC (NOT AT [facility]) - Abnormal; Notable for the following:    pH 8.5 (*)    Hgb urine dipstick SMALL (*)    Leukocytes, UA SMALL (*)    All other components within normal limits  URINE CULTURE  CBC WITH DIFFERENTIAL/PLATELET  URINE MICROSCOPIC-ADD ON    Imaging Review Ct Head Wo Contrast  03/19/2015   CLINICAL DATA:  Headache and right thigh pain for 1 month, worsening over the past 2 weeks. Nausea and vomiting.  EXAM: CT HEAD WITHOUT CONTRAST  TECHNIQUE: Contiguous axial images were obtained from the base of the skull through the vertex without intravenous contrast.  COMPARISON:  11/14/2014  FINDINGS: There is no intracranial hemorrhage, mass or evidence of acute infarction. There is no extra-axial fluid collection. Gray matter and white matter appear normal. Cerebral volume is normal for  age. Brainstem and posterior fossa are unremarkable. The CSF spaces appear normal.  The bony structures are intact. The visible portions of the paranasal sinuses are clear.  IMPRESSION: Normal brain   Electronically Signed   By: Andreas Newport M.D.   On: 03/19/2015 22:49     EKG Interpretation None      MDM   Final diagnoses:  Headache, unspecified headache type  Nausea and vomiting, vomiting of unspecified type  Diarrhea    9:42 PM 64 y.o. female w hx of fibromyalgia on po dilaudid, migraines who presents with a headache. She states she awoke this morning with a right-sided retro-orbital headache. Pain is sharp and 9 out of 10 pain. She states she has a long history of migraines and this although more intense seems consistent with that history. She denies any fevers or head injuries. She states that she has had some right eye pain which developed about one month ago. She also has some mild blurry vision in that eye which started with the eye pain. She is no pain with range of motion of the eye. She also notes she developed some diarrhea last night after eating [place] food and developed nausea and vomiting throughout the day. Vital signs unremarkable here. We'll get screening labs, imaging, IV fluids, and a HA cocktail.  11:04 PM: Pt feeling much better. Would like to go home. I interpreted/reviewed the labs and/or imaging which were non-contributory.   I have discussed the diagnosis/risks/treatment options with the patient and family and believe the pt to be eligible for discharge home to follow-up with her pcp as needed. We also discussed returning to the ED immediately if new or worsening sx occur. We discussed the sx which are most concerning (e.g., worsening HA, fever, intractable vomiting, inability to tolerate po) that necessitate immediate return. Medications administered to the patient during their visit and any new prescriptions provided to the patient are listed below.  Medications  given during this visit Medications  sodium chloride 0.9 % bolus 1,000 mL (0 mLs Intravenous Stopped 03/19/15 2139)  0.9 %  sodium chloride infusion ( Intravenous New Bag/Given 03/19/15 2139)  metoCLOPramide (REGLAN) injection 10 mg (10 mg Intravenous Given 03/19/15 2135)  diphenhydrAMINE (BENADRYL) injection 25 mg (25 mg Intravenous Given 03/19/15 2134)  dexamethasone (DECADRON) injection 10 mg (10 mg Intravenous Given 03/19/15 2135)  HYDROmorphone (DILAUDID) injection 1 mg (1 mg Intravenous Given 03/19/15 2139)  New Prescriptions   No medications on file     Pamella Pert, MD 03/21/15 [phone]

## 2015-03-20 ENCOUNTER — Other Ambulatory Visit: Payer: Self-pay | Admitting: Family Medicine

## 2015-03-21 LAB — URINE CULTURE

## 2015-03-24 ENCOUNTER — Ambulatory Visit (INDEPENDENT_AMBULATORY_CARE_PROVIDER_SITE_OTHER): Payer: BLUE CROSS/BLUE SHIELD | Admitting: Internal Medicine

## 2015-03-24 ENCOUNTER — Encounter: Payer: Self-pay | Admitting: Internal Medicine

## 2015-03-24 VITALS — BP 120/70 | HR 66 | Ht 68.0 in | Wt 225.0 lb

## 2015-03-24 DIAGNOSIS — R42 Dizziness and giddiness: Secondary | ICD-10-CM | POA: Diagnosis not present

## 2015-03-24 DIAGNOSIS — R001 Bradycardia, unspecified: Secondary | ICD-10-CM

## 2015-03-24 DIAGNOSIS — R002 Palpitations: Secondary | ICD-10-CM

## 2015-03-24 NOTE — Progress Notes (Signed)
Patient Care Team: Dickie La, MD as PCP - General   HPI  Rebecca Marshall is a 64 y.o. female Seen in follow-up for atrial tachycardia for which she has been treated with flecainide.  Over recent months she has noted more problems with palpitations. These are associated with lightheadedness and shortness of breath and some presyncope. There have been 3 distinct syndromes. The first is a hard rapid beat associated with lightheadedness. She also has "flutters" these are associated primarily with shortness of breath. In the third is that she has had sustained tachycardia; apparently she had a heart rate of 220 bpm month or 2 ago. This was the first sustained in a long time.  She has also had some issues with slow heart rates, i.e. in the 50s  She is also noted increasing problems with fatigue. She is now being treated with sleep apnea and this was associated with initial improvement. Fatigue has subsequently gotten worse. Concurrent with this sleep apnea therapy however has been the introduction of Dilaudid      Past Medical History  Diagnosis Date  . Atrial dysrhythmia   . Fibromyalgia   . Cancer     thyroid-status post resection on replacement Dr. Chalmers Cater  . Anxiety   . Depression   . Thyroid disease   . GE reflux   . Arthritis   . Osteoporosis     Past Surgical History  Procedure Laterality Date  . Cholecystectomy    . Thyroidectomy  04/2009    papillary carcimao Dr. Erik Obey  . Total hip arthroplasty  2009 and 2011    L hip   . Tubal ligation    . Abdominal hysterectomy    . Colonoscopy  04/2011  . Toe surgery      bone spur removed - rt great toe    Current Outpatient Prescriptions  Medication Sig Dispense Refill  . calcium-vitamin D (OSCAL WITH D) 500-200 MG-UNIT per tablet Take 1 tablet by mouth every morning.    . diazepam (VALIUM) 10 MG tablet Take 0.5 tablets (5 mg total) by mouth every 12 (twelve) hours as needed for anxiety. 30 tablet 4  . flecainide  (TAMBOCOR) 150 MG tablet Take 1 tablet (150 mg total) by mouth 2 (two) times daily. 180 tablet 3  . FLUoxetine (PROZAC) 20 MG tablet Take 1 tablet (20 mg total) by mouth daily. 30 tablet 3  . HYDROmorphone (DILAUDID) 2 MG tablet Take 2 mg by mouth every 4 (four) hours as needed. Per pain clinic    . levothyroxine (SYNTHROID, LEVOTHROID) 150 MCG tablet Take 150 mcg by mouth daily before breakfast.    . ondansetron (ZOFRAN ODT) 4 MG disintegrating tablet 4mg  ODT q4 hours prn nausea/vomit 15 tablet 0  . oxybutynin (DITROPAN XL) 10 MG 24 hr tablet Take 10 mg by mouth at bedtime.    . pantoprazole (PROTONIX) 40 MG tablet Take 1 tablet (40 mg total) by mouth 2 (two) times daily. 180 tablet 3  . sodium chloride (OCEAN) 0.65 % SOLN nasal spray Place 1 spray into both nostrils as needed (For nasal dryness.).    Marland Kitchen traZODone (DESYREL) 100 MG tablet TAKE 2 TABLETS BY MOUTH AT BEDTIME. 180 tablet 3  . triamcinolone cream (KENALOG) 0.1 % Apply 1 application topically 2 (two) times daily as needed (for ezcema).     No current facility-administered medications for this visit.    Allergies  Allergen Reactions  . Fentanyl Anaphylaxis  . Nsaids Other (  See Comments)    Rectal bleeding  . Codeine Nausea And Vomiting    Review of Systems negative except from HPI and PMH  Physical Exam BP 120/70 mmHg  Pulse 66  Ht 5\' 8"  (1.727 m)  Wt 225 lb (102.059 kg)  BMI 34.22 kg/m2 Well developed and well nourished in no acute distress HENT normal E scleral and icterus clear Neck Supple JVP flat; carotids brisk and full Clear to ausculation  *Regular rate and rhythm, no murmurs gallops or rub Soft with active bowel sounds No clubbing cyanosis  Edema Alert and oriented, grossly normal motor and sensory function Skin Warm and Dry  ECG was ordered and demonstrated sinus rhythm at 66 Intervals 21/11/46 Axis is left at -30 Poor R-wave progression  Outside records reviewed demonstrating normal laboratories  from emergency room visit 6/16 ER notes were also reviewed ECG 2/16 demonstrated sinus rhythm at 70  Last TSH 1.44   8/15 ; however, her endocrinologist follow these closely and had been done more recently.  Assessment and  Plan  Obstructive sleep apnea-treated  Fatigue  Bradycardia-reported  Atrial tachycardia/palpitations-recurrent  Chronic pain -diluadid   The patient has recurrent and worsening palpitations. In her mind they are similar to what was previously documented as an atrial tachycardia. We will discontinue her flecainide uncertain not sound like they're underlying triggering issues based on normal lab work reported by her endocrinologist  We will try her on propafenone at an intermediate dose, I will give her prescriptions for SR 325 twice a day and regular 300 twice a day and she will choose them based on cost. In the event that her symptoms abate, repeat monitoring will not be undertaken and we will see her in 6 months. If her symptoms don't abate, we will use a 30 day event recorder to try to clarify both heart rate excursion as well as cause of symptoms.  We discussed briefly the possibility of catheter ablation based on the results of the monitor.  Her fatigue may be related to the use of opiates over the last 6-8 months

## 2015-03-24 NOTE — Patient Instructions (Addendum)
Medication Instructions:  Your physician has recommended you make the following change in your medication:  1) STOP Flecainide  You have been given 2 different prescriptions to try. You may take them in any order. DO NOT take these medications at the same time.  - Propafenone 300 mg twice daily - Propafenone ER 325mg  twice daily  Labwork: None ordered  Testing/Procedures: None ordered  Follow-Up: To be determined in a couple of weeks.  Any Other Special Instructions Will Be Listed Below (If Applicable). Please call office in 2 week and let us know how you are doing with the new medication.  Thank you for choosing [facility]!!

## 2015-04-05 ENCOUNTER — Other Ambulatory Visit: Payer: Self-pay | Admitting: Family Medicine

## 2015-04-05 ENCOUNTER — Ambulatory Visit (INDEPENDENT_AMBULATORY_CARE_PROVIDER_SITE_OTHER): Payer: BLUE CROSS/BLUE SHIELD | Admitting: Family Medicine

## 2015-04-05 ENCOUNTER — Encounter: Payer: Self-pay | Admitting: Family Medicine

## 2015-04-05 VITALS — BP 121/67 | HR 78 | Temp 98.2°F | Ht 68.0 in | Wt 219.8 lb

## 2015-04-05 DIAGNOSIS — L720 Epidermal cyst: Secondary | ICD-10-CM

## 2015-04-05 DIAGNOSIS — M25559 Pain in unspecified hip: Secondary | ICD-10-CM

## 2015-04-05 DIAGNOSIS — F341 Dysthymic disorder: Secondary | ICD-10-CM | POA: Diagnosis not present

## 2015-04-05 DIAGNOSIS — G8929 Other chronic pain: Secondary | ICD-10-CM | POA: Diagnosis not present

## 2015-04-05 HISTORY — DX: Other chronic pain: G89.29

## 2015-04-05 MED ORDER — FLUOXETINE HCL 20 MG PO TABS: 20.0000 mg | ORAL_TABLET | Freq: Every day | ORAL | Status: DC

## 2015-04-05 MED ORDER — ELETRIPTAN HYDROBROMIDE 40 MG PO TABS: ORAL_TABLET | ORAL | Status: AC

## 2015-04-05 MED ORDER — CELECOXIB 200 MG PO CAPS: 200.0000 mg | ORAL_CAPSULE | Freq: Two times a day (BID) | ORAL | Status: DC

## 2015-04-05 NOTE — Assessment & Plan Note (Signed)
For right now continue her on the 20 mg a day maintenance dose of fluoxetine. At some point she would like to try going down to 10 mg a day or going totally off the medication. Given her family history and her personal history I think I would lean towards the side of chronic maintenance therapy but ultimately decision is hers. I did give her refill today.

## 2015-04-05 NOTE — Progress Notes (Signed)
   Subjective:    Patient ID: Rebecca Marshall, female    DOB: 10-Sep-1951, 64 y.o.   MRN: [id number]  HPI #1. Wants to consider restarting Celebrex. There are times she uses an NSAID she has some rectal bleeding. She has Artie had a colonoscopy and complete workup for her GI doctor for that. Essentially they find anything worrisome. She's been seeing the pain clinic and was placed on some narcotic therapy which really help her pain but that she really would like to not be on chronic narcotics. #2. Seeing her cardiologist regularly. Having some palpitations and some dizziness. They recently changed her medicine to propafenone. #3. Has a lesion on the side of her neck that gets really irritated when she wears necklaces. She's not sure if it's a skin tag or what but wants it gone.   Review of Systems No fever, sweats, unusual weight change. See history of present illness. Continues to have hip and low back pain    Objective:   Physical Exam Vital signs reviewed. GENERAL: Well-developed, well-nourished, no acute distress. CARDIOVASCULAR: Regular rate and rhythm no murmur gallop or rub LUNGS: Clear to auscultation bilaterally, no rales or wheeze. ABDOMEN: Soft positive bowel sounds NEURO: No gross focal neurological deficits. MSK: Movement of extremity x 4. SKIN: Left neck reveals a 3 or 4 mm inclusion cyst.          Assessment & Plan:

## 2015-04-05 NOTE — Assessment & Plan Note (Signed)
I gave her prescription for Celebrex. We'll try it once a day if that doesn't work she can move it to twice a day.

## 2015-04-10 ENCOUNTER — Telehealth: Payer: Self-pay | Admitting: Internal Medicine

## 2015-04-10 NOTE — Telephone Encounter (Signed)
Patient reports worsening palpitations since stopping Flecainide and starting Propafenone. Complains also of dizziness/light-headedness, SOB at rest and w/ activity.  States she cannot exercise or even walk without palpitations worsening. When asked she could not tell me recent BP. Pt saw her PCP last week and BP in office was normal at 121/67.  She states HRs have been in the 50s. She stopped taking Propafenone yesterday and restarted her Flecainide. Aware that will review with Dr. Caryl Comes and call her with recommendations.  Pt aware he will not be in office until tomorrow and this may not be addressed until then. Patient verbalized understanding and agreeable to plan.

## 2015-04-10 NOTE — Telephone Encounter (Signed)
New message      Pt c/o medication issue:  1. Name of Medication: propafenone 2. How are you currently taking this medication (dosage and times per day)? 325 bid  3. Are you having a reaction (difficulty breathing--STAT)?   4. What is your medication issue?  Medication is causing irr heart beat---worse that ever, sob and feeling fainty

## 2015-04-11 NOTE — Telephone Encounter (Signed)
Reviewed w/ Dr Caryl Comes - advised patient to come in 7/29 for OV. Patient verbalized understanding and agreeable to plan.

## 2015-04-21 ENCOUNTER — Ambulatory Visit (INDEPENDENT_AMBULATORY_CARE_PROVIDER_SITE_OTHER): Payer: BLUE CROSS/BLUE SHIELD | Admitting: Internal Medicine

## 2015-04-21 ENCOUNTER — Encounter: Payer: Self-pay | Admitting: Internal Medicine

## 2015-04-21 VITALS — BP 100/66 | HR 66 | Ht 68.0 in | Wt 226.2 lb

## 2015-04-21 DIAGNOSIS — R002 Palpitations: Secondary | ICD-10-CM

## 2015-04-21 DIAGNOSIS — I471 Supraventricular tachycardia: Secondary | ICD-10-CM | POA: Diagnosis not present

## 2015-04-21 DIAGNOSIS — R001 Bradycardia, unspecified: Secondary | ICD-10-CM | POA: Diagnosis not present

## 2015-04-21 DIAGNOSIS — R000 Tachycardia, unspecified: Secondary | ICD-10-CM | POA: Diagnosis not present

## 2015-04-21 MED ORDER — FLECAINIDE ACETATE 150 MG PO TABS: 150.0000 mg | ORAL_TABLET | Freq: Two times a day (BID) | ORAL | Status: DC

## 2015-04-21 NOTE — Patient Instructions (Addendum)
Medication Instructions:  Your physician has recommended you make the following change in your medication:  1) START Flecainide 150 mg tablet by mouth TWICE daily  Labwork: Your physician recommends that you return for lab work TODAY ([facility])   Testing/Procedures: Your physician has requested that you have an exercise tolerance test. For further information please visit [email or web address]. Please also follow instruction sheet, as given.  Follow-Up: Your follow up appointment will be setup based off of your treadmill test.  Any Other Special Instructions Will Be Listed Below (If Applicable).

## 2015-04-21 NOTE — Progress Notes (Signed)
Patient Care Team: Dickie La, MD as PCP - General   HPI  Rebecca Marshall. [name] is a 64 y.o. female Seen in follow-up for atrial tachycardia for which she has been treated with flecainide.  Over recent months she has noted more problems with palpitations. These are associated with lightheadedness and shortness of breath and some presyncope. There have been 3 distinct syndromes. The first is a hard rapid beat associated with lightheadedness. She also has "flutters" these are associated primarily with shortness of breath. In the third is that she has had sustained tachycardia; apparently she had a heart rate of 220 bpm month or 2 ago. This was the first sustained in a long time.  She has also had some issues with slow heart rates, i.e. in the 50s  We tried at last visit to switch her from flecainide--propafenone; this was poorly tolerated. She has resumed flecainide at 200 mg a day with now just a few symptoms.        Past Medical History  Diagnosis Date  . Atrial dysrhythmia   . Fibromyalgia   . Cancer     thyroid-status post resection on replacement Dr. Chalmers Cater  . Anxiety   . Depression   . Thyroid disease   . GE reflux   . Arthritis   . Osteoporosis     Past Surgical History  Procedure Laterality Date  . Cholecystectomy    . Thyroidectomy  04/2009    papillary carcimao Dr. Erik Obey  . Total hip arthroplasty  2009 and 2011    L hip   . Tubal ligation    . Abdominal hysterectomy    . Colonoscopy  04/2011  . Toe surgery      bone spur removed - rt great toe    Current Outpatient Prescriptions  Medication Sig Dispense Refill  . calcium-vitamin D (OSCAL WITH D) 500-200 MG-UNIT per tablet Take 1 tablet by mouth every morning.    . diazepam (VALIUM) 10 MG tablet Take 0.5 tablets (5 mg total) by mouth every 12 (twelve) hours as needed for anxiety. 30 tablet 4  . eletriptan (RELPAX) 40 MG tablet Take by mouth one at the beginning of headache and may repeat once in 2  Hours  for maximum of 2 tabs in 24hours 27 tablet 3  . FLUoxetine (PROZAC) 20 MG tablet Take 1 tablet (20 mg total) by mouth daily. 90 tablet 3  . HYDROmorphone (DILAUDID) 2 MG tablet Take 2 mg by mouth every 4 (four) hours as needed. Per pain clinic    . levothyroxine (SYNTHROID, LEVOTHROID) 150 MCG tablet Take 150 mcg by mouth daily before breakfast.    . ondansetron (ZOFRAN ODT) 4 MG disintegrating tablet 4mg  ODT q4 hours prn nausea/vomit 15 tablet 0  . oxybutynin (DITROPAN XL) 10 MG 24 hr tablet Take 10 mg by mouth at bedtime.    . pantoprazole (PROTONIX) 40 MG tablet Take 1 tablet (40 mg total) by mouth 2 (two) times daily. 180 tablet 3  . sodium chloride (OCEAN) 0.65 % SOLN nasal spray Place 1 spray into both nostrils as needed (For nasal dryness.).    Marland Kitchen traZODone (DESYREL) 100 MG tablet TAKE 2 TABLETS AT BEDTIME 180 tablet 2  . triamcinolone cream (KENALOG) 0.1 % Apply 1 application topically 2 (two) times daily as needed (for ezcema).     No current facility-administered medications for this visit.    Allergies  Allergen Reactions  . Fentanyl Anaphylaxis  . Nsaids Other (  See Comments)    Rectal bleeding  . Codeine Nausea And Vomiting    Review of Systems negative except from HPI and PMH  Physical Exam BP 100/66 mmHg  Pulse 66  Ht 5\' 8"  (1.727 m)  Wt 226 lb 3.2 oz (102.604 kg)  BMI 34.40 kg/m2 Well developed and well nourished in no acute distress HENT normal E scleral and icterus clear Neck Supple JVP flat; carotids brisk and full Clear to ausculation  *Regular rate and rhythm, no murmurs gallops or rub Soft with active bowel sounds No clubbing cyanosis  Edema Alert and oriented, grossly normal motor and sensory function Skin Warm and Dry  ECG was ordered and demonstrated sinus rhythm at 66 Intervals 21/11/46 Axis is left at -30 Poor R-wave progression  Outside records reviewed demonstrating normal laboratories from emergency room visit 6/16 ER notes were also  reviewed   ECG demonstrates sinus rhythm at 66 Intervals 21/12/43 Left axis deviation Poor R-wave progression  this is unchanged from 2015  Last TSH 1.44   8/15 ; however, her endocrinologist follow these closely and had been done more recently.  Assessment and  Plan  Obstructive sleep apnea-treated  Fatigue  Bradycardia-reported  Atrial tachycardia/palpitations-recurrent  Chronic pain -diluadid   The patient has recurrent and worsening palpitations;  she would like to consider catheter ablation. As it has been a long time since she has had clarification of the arrhythmia, we will need to recorded. She thinks that exercise might be useful. We'll try a treadmill to start with. We will hold her flecainide in anticipation.  If that does not work we will stop her medications and give her 48 hour monitor.   She has a known multitude of palpitation syndromes and so we will have to be careful as to make sure we are identifying as many of these is possible

## 2015-04-28 ENCOUNTER — Telehealth: Payer: Self-pay | Admitting: *Deleted

## 2015-04-28 DIAGNOSIS — R002 Palpitations: Secondary | ICD-10-CM

## 2015-04-28 DIAGNOSIS — R000 Tachycardia, unspecified: Secondary | ICD-10-CM

## 2015-04-28 DIAGNOSIS — R001 Bradycardia, unspecified: Secondary | ICD-10-CM

## 2015-04-28 MED ORDER — FLECAINIDE ACETATE 150 MG PO TABS: 150.0000 mg | ORAL_TABLET | Freq: Two times a day (BID) | ORAL | Status: DC

## 2015-04-28 NOTE — Telephone Encounter (Signed)
Reviewed with Dr. Caryl Comes.  Advised patient that I would send refills to [facility] per her request.   She thanks me for helping.

## 2015-04-28 NOTE — Telephone Encounter (Signed)
Patient called to cancel her event monitor and ett as she cannot afford them. She would like to know if she could have a long term supply of the flecainide sent to her mail order pharmacy. She also was to have a flecainide level the same day as she saw Dr Caryl Comes and I do not see where this was collected. I wanted to be sure that this was ok to send. Please advise. Thanks, MI

## 2015-05-02 ENCOUNTER — Encounter: Payer: BLUE CROSS/BLUE SHIELD | Admitting: Internal Medicine

## 2015-05-04 NOTE — Telephone Encounter (Signed)
Discussed with patient when she would feel most comfortable following up with Dr. Caryl Comes.  She prefers to see him in 6 months and will call office if palpitations/issues arise prior to follow up. Recall for 6 months placed in [facility].

## 2015-05-08 ENCOUNTER — Other Ambulatory Visit: Payer: Self-pay | Admitting: *Deleted

## 2015-05-09 MED ORDER — FLUOXETINE HCL 20 MG PO TABS: 20.0000 mg | ORAL_TABLET | Freq: Every day | ORAL | Status: DC

## 2015-06-05 ENCOUNTER — Ambulatory Visit: Payer: BLUE CROSS/BLUE SHIELD | Admitting: Family Medicine

## 2015-06-06 ENCOUNTER — Ambulatory Visit: Payer: BLUE CROSS/BLUE SHIELD | Admitting: Internal Medicine

## 2015-06-28 ENCOUNTER — Ambulatory Visit (INDEPENDENT_AMBULATORY_CARE_PROVIDER_SITE_OTHER): Payer: BLUE CROSS/BLUE SHIELD | Admitting: Family Medicine

## 2015-06-28 ENCOUNTER — Encounter: Payer: Self-pay | Admitting: Family Medicine

## 2015-06-28 VITALS — BP 107/74 | HR 78 | Ht 68.0 in | Wt 220.0 lb

## 2015-06-28 DIAGNOSIS — M25561 Pain in right knee: Secondary | ICD-10-CM | POA: Diagnosis not present

## 2015-06-28 DIAGNOSIS — M25562 Pain in left knee: Secondary | ICD-10-CM | POA: Diagnosis not present

## 2015-06-28 NOTE — Patient Instructions (Signed)
Your knee pain is due to arthritis. These are the different medicines you can take that may help with this: Take tylenol 500mg  1-2 tabs three times a day for pain. Glucosamine sulfate 750mg  twice a day is a supplement that may help. Capsaicin, aspercreme, or biofreeze topically up to four times a day may also help with pain. Cortisone injections are an option - you were given these today. If cortisone injections do not help, there are different types of shots that may help but they take longer to take effect. It's important that you continue to stay active. Straight leg raises, knee extensions 3 sets of 10 once a day (add ankle weight if these become too easy). Consider physical therapy to strengthen muscles around the joint that hurts to take pressure off of the joint itself. Shoe inserts with good arch support may be helpful. Walker or cane if needed. Heat or ice 15 minutes at a time 3-4 times a day as needed to help with pain. Water aerobics and cycling with low resistance are the best two types of exercise for arthritis. Follow up with me as needed.

## 2015-06-29 DIAGNOSIS — M25561 Pain in right knee: Secondary | ICD-10-CM

## 2015-06-29 DIAGNOSIS — M25562 Pain in left knee: Secondary | ICD-10-CM | POA: Diagnosis not present

## 2015-06-29 HISTORY — DX: Pain in right knee: M25.561

## 2015-06-29 MED ORDER — METHYLPREDNISOLONE ACETATE 40 MG/ML IJ SUSP
40.0000 mg | Freq: Once | INTRAMUSCULAR | Status: AC
  Administered 2015-06-29: 40 mg via INTRA_ARTICULAR

## 2015-06-29 NOTE — Progress Notes (Signed)
PCP: Dorcas Mcmurray, MD  Subjective:   HPI: Patient is a 64 y.o. female here for bilateral knee pain.  Patient has known history of bilateral knee DJD. Has had worsening pain over several years but worse past couple weeks. Worse with any ambulation. Lateral joint line mainly both knees. Pain level 7/10, described as achy. No swelling, redness, skin changes, fever. No catching, locking. Takes dilaudid with tylenol as needed. Last injections over 3-4 months ago.  Past Medical History  Diagnosis Date  . Atrial dysrhythmia   . Fibromyalgia   . Cancer [facility])     thyroid-status post resection on replacement Dr. Chalmers Cater  . Anxiety   . Depression   . Thyroid disease   . GE reflux   . Arthritis   . Osteoporosis     Current Outpatient Prescriptions on File Prior to Visit  Medication Sig Dispense Refill  . calcium-vitamin D (OSCAL WITH D) 500-200 MG-UNIT per tablet Take 1 tablet by mouth every morning.    . diazepam (VALIUM) 10 MG tablet Take 0.5 tablets (5 mg total) by mouth every 12 (twelve) hours as needed for anxiety. 30 tablet 4  . eletriptan (RELPAX) 40 MG tablet Take by mouth one at the beginning of headache and may repeat once in 2  Hours for maximum of 2 tabs in 24hours 27 tablet 3  . flecainide (TAMBOCOR) 150 MG tablet Take 1 tablet (150 mg total) by mouth 2 (two) times daily. 180 tablet 3  . FLUoxetine (PROZAC) 20 MG tablet Take 1 tablet (20 mg total) by mouth daily. 90 tablet 3  . HYDROmorphone (DILAUDID) 2 MG tablet Take 2 mg by mouth every 4 (four) hours as needed. Per pain clinic    . levothyroxine (SYNTHROID, LEVOTHROID) 150 MCG tablet Take 150 mcg by mouth daily before breakfast.    . ondansetron (ZOFRAN ODT) 4 MG disintegrating tablet 4mg  ODT q4 hours prn nausea/vomit 15 tablet 0  . oxybutynin (DITROPAN XL) 10 MG 24 hr tablet Take 10 mg by mouth at bedtime.    . pantoprazole (PROTONIX) 40 MG tablet Take 1 tablet (40 mg total) by mouth 2 (two) times daily. 180 tablet 3  .  sodium chloride (OCEAN) 0.65 % SOLN nasal spray Place 1 spray into both nostrils as needed (For nasal dryness.).    Marland Kitchen traZODone (DESYREL) 100 MG tablet TAKE 2 TABLETS AT BEDTIME 180 tablet 2  . triamcinolone cream (KENALOG) 0.1 % Apply 1 application topically 2 (two) times daily as needed (for ezcema).     No current facility-administered medications on file prior to visit.    Past Surgical History  Procedure Laterality Date  . Cholecystectomy    . Thyroidectomy  04/2009    papillary carcimao Dr. Erik Obey  . Total hip arthroplasty  2009 and 2011    L hip   . Tubal ligation    . Abdominal hysterectomy    . Colonoscopy  04/2011  . Toe surgery      bone spur removed - rt great toe    Allergies  Allergen Reactions  . Fentanyl Anaphylaxis  . Nsaids Other (See Comments)    Rectal bleeding  . Codeine Nausea And Vomiting    Social History   Social History  . Marital Status: Married    Spouse Name: [name]  . Number of Children: 3  . Years of Education: 12+   Occupational History  . unemployed     Social History Main Topics  . Smoking status: Never Smoker   .  Smokeless tobacco: Never Used  . Alcohol Use: No     Comment: none  . Drug Use: No  . Sexual Activity: Not on file   Other Topics Concern  . Not on file   Social History Narrative   Patient lives at home alone    Patient is married    Patient is not working    Patient has 3 children    Patient has her BSN RN   Patient is right handed       Daily caffeine use 1 cup daily.    Family History  Problem Relation Age of Onset  . Cancer Other   . Alcohol abuse Mother   . Cancer Mother 73    Lung   . Alcohol abuse Father   . Cancer Sister 27    Breast    BP 107/74 mmHg  Pulse 78  Ht 5\' 8"  (1.727 m)  Wt 220 lb (99.791 kg)  BMI 33.46 kg/m2  Review of Systems: See HPI above.    Objective:  Physical Exam:  Gen: NAD  Left knee: 1+ synovitis.  No effusion.  No bruising, other deformity. TTP lateral >  medial joint lines.  Mild tenderness post patellar facets as well. FROM. Negative ant/post drawers. Negative valgus/varus testing. Negative lachmanns. Negative mcmurrays, apleys, patellar apprehension. NV intact distally.  Right knee: 1+ synovitis.  No effusion.  No bruising, other deformity. TTP lateral > medial joint lines.  Mild tenderness post patellar facets as well. FROM. Negative ant/post drawers. Negative valgus/varus testing. Negative lachmanns. Negative mcmurrays, apleys, patellar apprehension. NV intact distally.   Assessment & Plan:  1. Bilateral knee pain - 2/2 DJD.  Went ahead with injections today.  Discussed tylenol, glucosamine, topical medications.  Shown home exercises to do daily.  She would like to do physical therapy as well.  Heat or ice if needed.  Arch supports.  F/u prn.  After informed written consent, patient was seated on exam table. Left knee was prepped with alcohol swab and utilizing anteromedial approach, patient's left knee was injected intraarticularly with 3:1 marcaine: depomedrol. Patient tolerated the procedure well without immediate complications.  After informed written consent, patient was seated on exam table. Right knee was prepped with alcohol swab and utilizing anteromedial approach, patient's right knee was injected intraarticularly with 3:1 marcaine: depomedrol. Patient tolerated the procedure well without immediate complications.

## 2015-06-29 NOTE — Assessment & Plan Note (Signed)
2/2 DJD.  Went ahead with injections today.  Discussed tylenol, glucosamine, topical medications.  Shown home exercises to do daily.  She would like to do physical therapy as well.  Heat or ice if needed.  Arch supports.  F/u prn.  After informed written consent, patient was seated on exam table. Left knee was prepped with alcohol swab and utilizing anteromedial approach, patient's left knee was injected intraarticularly with 3:1 marcaine: depomedrol. Patient tolerated the procedure well without immediate complications.  After informed written consent, patient was seated on exam table. Right knee was prepped with alcohol swab and utilizing anteromedial approach, patient's right knee was injected intraarticularly with 3:1 marcaine: depomedrol. Patient tolerated the procedure well without immediate complications.

## 2015-07-25 ENCOUNTER — Other Ambulatory Visit: Payer: Self-pay | Admitting: Family Medicine

## 2015-07-26 ENCOUNTER — Other Ambulatory Visit: Payer: Self-pay | Admitting: *Deleted

## 2015-07-26 MED ORDER — DIAZEPAM 10 MG PO TABS: 5.0000 mg | ORAL_TABLET | Freq: Two times a day (BID) | ORAL | Status: DC | PRN

## 2015-07-26 MED ORDER — TRAZODONE HCL 100 MG PO TABS: 200.0000 mg | ORAL_TABLET | Freq: Every day | ORAL | Status: DC

## 2015-07-26 MED ORDER — FLUOXETINE HCL 20 MG PO TABS: 20.0000 mg | ORAL_TABLET | Freq: Every day | ORAL | Status: DC

## 2015-08-29 ENCOUNTER — Other Ambulatory Visit: Payer: Self-pay

## 2015-08-29 DIAGNOSIS — Z1231 Encounter for screening mammogram for malignant neoplasm of breast: Secondary | ICD-10-CM

## 2015-10-10 MED FILL — HYDROmorphone HCL 2 MG TABS: 2 | 30 days supply | Qty: 180 | Fill #0

## 2015-10-16 ENCOUNTER — Other Ambulatory Visit: Payer: Self-pay | Admitting: Family Medicine

## 2015-10-20 ENCOUNTER — Other Ambulatory Visit: Payer: Self-pay

## 2015-10-20 ENCOUNTER — Ambulatory Visit
Admission: RE | Admit: 2015-10-20 | Discharge: 2015-10-20 | Disposition: A | Discharge status: Home / Self Care | Payer: BLUE CROSS/BLUE SHIELD | Source: Ambulatory Visit

## 2015-10-20 DIAGNOSIS — Z1231 Encounter for screening mammogram for malignant neoplasm of breast: Secondary | ICD-10-CM

## 2015-10-25 DIAGNOSIS — G4730 Sleep apnea, unspecified: Secondary | ICD-10-CM

## 2015-10-25 HISTORY — DX: Sleep apnea, unspecified: G47.30

## 2015-10-27 ENCOUNTER — Institutional Professional Consult (permissible substitution): Payer: BLUE CROSS/BLUE SHIELD | Admitting: Neurology

## 2015-11-06 ENCOUNTER — Institutional Professional Consult (permissible substitution): Payer: BLUE CROSS/BLUE SHIELD | Admitting: Neurology

## 2015-11-08 ENCOUNTER — Ambulatory Visit (INDEPENDENT_AMBULATORY_CARE_PROVIDER_SITE_OTHER): Payer: BLUE CROSS/BLUE SHIELD | Admitting: Neurology

## 2015-11-08 ENCOUNTER — Encounter: Payer: Self-pay | Admitting: Neurology

## 2015-11-08 VITALS — BP 120/78 | HR 84 | Resp 20 | Ht 68.0 in | Wt 226.0 lb

## 2015-11-08 DIAGNOSIS — R0902 Hypoxemia: Secondary | ICD-10-CM | POA: Diagnosis not present

## 2015-11-08 DIAGNOSIS — G43411 Hemiplegic migraine, intractable, with status migrainosus: Secondary | ICD-10-CM | POA: Diagnosis not present

## 2015-11-08 DIAGNOSIS — G44011 Episodic cluster headache, intractable: Secondary | ICD-10-CM

## 2015-11-08 DIAGNOSIS — G4733 Obstructive sleep apnea (adult) (pediatric): Secondary | ICD-10-CM | POA: Diagnosis not present

## 2015-11-08 DIAGNOSIS — Z9989 Dependence on other enabling machines and devices: Secondary | ICD-10-CM

## 2015-11-08 NOTE — Patient Instructions (Signed)
Cluster Headache Cluster headaches are recognized by their pattern of deep, intense head pain. They normally occur on one side of your head, but they may "switch sides" in subsequent episodes. Typically, cluster headaches:   Are severe in nature.   Occur repeatedly over weeks to months and are followed by periods of no headaches.   Can last from 15 minutes to 3 hours.   Occur at the same time each day, often at night.   Occur several times a day. CAUSES The exact cause of cluster headaches is not known. Alcohol use may be associated with cluster headaches. SIGNS AND SYMPTOMS   Severe pain that begins in or around your eye or temple.   One-sided head pain.   Feeling sick to your stomach (nauseous).   Sensitivity to light.   Runny nose.   Eye redness, tearing, and nasal stuffiness on the side of your head where you are experiencing pain.   Sweaty, pale skin of the face.   Droopy or swollen eyelid.   Restlessness. DIAGNOSIS  Cluster headaches are diagnosed based on symptoms and a physical exam. Your health care provider may order a CT scan or an MRI of your head or lab tests to see if your headaches are caused by other medical conditions.  TREATMENT   Medicines for pain relief and to prevent recurrent attacks. Some people may need a combination of medicines.  Oxygen for pain relief.   Biofeedback programs to help reduce headache pain.  It may be helpful to keep a headache diary. This may help you find a trend for what is triggering your headaches. Your health care provider can develop a treatment plan.  HOME CARE INSTRUCTIONS  During cluster periods:   Follow a regular sleep schedule. Do not vary the amount and time that you sleep from day to day. It is important to stay on the same schedule during a cluster period to help prevent headaches.   Avoid alcohol.   Stop smoking if you smoke.  SEEK MEDICAL CARE IF:  You have any changes from your previous  cluster headaches either in intensity or frequency.   You are not getting relief from medicines you are taking.  SEEK IMMEDIATE MEDICAL CARE IF:   You faint.   You have weakness or numbness, especially on one side of your body or face.   You have double vision.   You have nausea or vomiting that is not relieved within several hours.   You cannot keep your balance or have difficulty talking or walking.   You have neck pain or stiffness.   You have a fever. MAKE SURE YOU:  Understand these instructions.   Will watch your condition.   Will get help right away if you are not doing well or get worse.   This information is not intended to replace advice given to you by your health care provider. Make sure you discuss any questions you have with your health care provider.   Document Released: 09/09/2005 Document Revised: 06/30/2013 Document Reviewed: 04/01/2013 Elsevier Interactive Patient Education 2016 [employer] Hypoxemia Hypoxemia occurs when your blood does not contain enough oxygen. The body cannot work well when it does not have enough oxygen because every part of your body needs oxygen. Oxygen travels to all parts of the body through your blood. Hypoxemia can develop suddenly or can come on slowly. CAUSES Some common causes of hypoxemia include:  Long-term (chronic) lung diseases, such as chronic obstructive pulmonary disease (COPD) or interstitial lung  disease.  Disorders that affect breathing at night, such as sleep apnea.  Fluid buildup in your lungs (pulmonary edema).  Lung infection (pneumonia).  Lung or throat cancer.  Abnormal blood flow that bypasses the lungs (shunt).  Certain diseasesthat affect nerves or muscles.  A collapsed lung (pneumothorax).  A blood clot in the lungs (pulmonary embolus).  Certain types of heart disease.  Slow or shallow breathing (hypoventilation).  Certain medicines.  High altitudes.  Toxic chemicals  and gases. SIGNS AND SYMPTOMS Not everyone who has hypoxemia will develop symptoms. If the hypoxemia developed quickly, you will likely have symptoms such as shortness of breath. If the hypoxemia came on slowly over months or years, you may not notice any symptoms. Symptoms can include:  Shortness of breath (dyspnea).  Bluish color of the skin, lips, or nail beds.  Breathing that is fast, noisy, or shallow.  A fast heartbeat.  Feeling tired or sleepy.  Being confused or feeling anxious. DIAGNOSIS To determine if you have hypoxemia, your health care provider may perform:  A physical exam.  Blood tests.  A pulse oximetry. A sensor will be put on your finger, toe, or earlobe to measure the percent of oxygen in your blood. TREATMENT You will likely be treated with oxygen therapy. Depending on the cause of your hypoxemia, you may need oxygen for a short time (weeks or months), or you may need it indefinitely. Your health care provider may also recommend other therapies to treat the underlying cause of your hypoxemia. HOME CARE INSTRUCTIONS  Only take over-the-counter or prescription medicines as directed by your health care provider.  Follow oxygen safety measures if you are on oxygen therapy. These may include:  Always having a backup supply of oxygen.  Not allowing anyone to smoke around oxygen.  Handling the oxygen tanks carefully and as instructed.  If you smoke, quit. Stay away from people who smoke.  Follow up with your health care provider as directed. SEEK MEDICAL CARE IF:  You have any concerns about your oxygen therapy.  You still have trouble breathing.  You become short of breath when you exercise.  You are tired when you wake up.  You have a headache when you wake up. SEEK IMMEDIATE MEDICAL CARE IF:   Your breathing gets worse.  You have new shortness of breath with normal activity.  You have a bluish color of the skin, lips, or nail beds.  You have  confusion or cloudy thinking.  You cough up dark mucus.  You have chest pain.  You have a fever. MAKE SURE YOU:  Understand these instructions.  Will watch your condition.  Will get help right away if you are not doing well or get worse.   This information is not intended to replace advice given to you by your health care provider. Make sure you discuss any questions you have with your health care provider.   Document Released: 03/25/2011 Document Revised: 09/14/2013 Document Reviewed: 04/08/2013 Elsevier Interactive Patient Education [employer].

## 2015-11-08 NOTE — Progress Notes (Signed)
SLEEP MEDICINE CLINIC   Provider:  Larey Seat, M D  Referring Provider:  Sarina Ill, MD  Primary Care Physician:  Dorcas Mcmurray, MD  Chief Complaint  Patient presents with  . Follow-up    getting headaches daily, thinks her cpap pressure needs adjusting, use [facility], rm 11, alone    HPI:  Rebecca Marshall is a 65 y.o. female , seen here as a patient of Dr. Ferdinand Lango and has seen Dr Jannifer Franklin, with headaches.  She has undergone a sleep evaluation in 2015-16 and wonders if her CPAP contributes to the problem.  [name] underwent a sleep study evaluation on 10/23/2014, at the time endorsing the Epworth sleepiness score at 14 points and the depression inventory at 7 points. She was diagnosed with an AHI of 20.2 the lowest oxygen saturation was 83% with 190 minutes of desaturation time she did not produce any tachycardia or bradycardia arrhythmia. She was titrated to 8 cm water was 1 cm flex function and was placed on a pico Respironics mask medium size the AHI at this pressure was 0.0 and she slept 56 minutes with a machine at this pressure.  She had previously seen Dr. Jaynee Eagles for memory loss and a sleep evaluation was also necessitated by her frequent headaches. Dr. Jaynee Eagles ordered an MRI of the brain, a neuropsychological evaluation, initiated a dementia workup and had ordered a sleep study she also order an EEG which was interpreted by Dr. Jannifer Franklin. In the meantime she has 100% compliance for days of CPAP use and 80% compliance for over 4 hours of nightly use. Average user time is 6 hours. The machine is currently set at 9 cm water pressure was 1 cm EPR she has minor air leaks and an excellent resolution of apnea, the residual AHI is 1.6. 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   Given that she had prolonged period of sleep hypoxemia and still has a headache while using CPAP it may be worthwhile for [place] to check her  oxygen level. She describes her headaches as daily and is quite discouraged. The geriatric depression score today was endorsed at 6 points at Epworth sleepiness score at 7 points fatigue severity score at 58 points. The patient takes vitamin B supplements occasionally vitamin D supplements. No iron or multimineral supplement.  she wakes with headaches, often woken by headaches as early 3.30 AM and nausea and photophobia are present, occipital and behind the right eye. This stabbing, sharp point with tearing of the right eye- CLUSTER? She has begun taking naps daily- this is new.   Sleep habits are as follows: 10 PM , goes to sleep within 5-10 minutes, with CPAP, she has 3-4 bathroom breaks, wakes every morning with headaches, at 6 AM, sleeps on 1 pillow, The bedroom is cool and quiet and dark. Her husband sleeps in another house, across the street. She just got married, the households are not combined yet.   Review of Systems: Out of a complete 14 system review, the patient complains of only the following symptoms, and all other reviewed systems are negative.  see above   Epworth score 7 , Fatigue severity score 58  , depression score    Social History   Social History  . Marital Status: Married    Spouse Name: [name]  . Number of Children: 3  . Years of Education: 12+   Occupational History  .  unemployed     Social History Main Topics  . Smoking status: Never Smoker   . Smokeless tobacco: Never Used  . Alcohol Use: No     Comment: none  . Drug Use: No  . Sexual Activity: Not on file   Other Topics Concern  . Not on file   Social History Narrative   Patient lives at home alone    Patient is married    Patient is not working    Patient has 3 children    Patient has her BSN RN   Patient is right handed       Daily caffeine use 1 cup daily.    Family History  Problem Relation Age of Onset  . Cancer Other   . Alcohol abuse Mother   . Cancer Mother 64    Lung   . Alcohol  abuse Father   . Cancer Sister 54    Breast    Past Medical History  Diagnosis Date  . Atrial dysrhythmia   . Fibromyalgia   . Cancer [facility])     thyroid-status post resection on replacement Dr. Chalmers Cater  . Anxiety   . Depression   . Thyroid disease   . GE reflux   . Arthritis   . Osteoporosis     Past Surgical History  Procedure Laterality Date  . Cholecystectomy    . Thyroidectomy  04/2009    papillary carcimao Dr. Erik Obey  . Total hip arthroplasty  2009 and 2011    L hip   . Tubal ligation    . Abdominal hysterectomy    . Colonoscopy  04/2011  . Toe surgery      bone spur removed - rt great toe    Current Outpatient Prescriptions  Medication Sig Dispense Refill  . calcium-vitamin D (OSCAL WITH D) 500-200 MG-UNIT per tablet Take 1 tablet by mouth every morning.    . diazepam (VALIUM) 10 MG tablet Take 0.5 tablets (5 mg total) by mouth every 12 (twelve) hours as needed for anxiety. 30 tablet 4  . eletriptan (RELPAX) 40 MG tablet Take by mouth one at the beginning of headache and may repeat once in 2  Hours for maximum of 2 tabs in 24hours 27 tablet 3  . flecainide (TAMBOCOR) 150 MG tablet Take 1 tablet (150 mg total) by mouth 2 (two) times daily. 180 tablet 3  . FLUoxetine (PROZAC) 20 MG tablet Take 1 tablet (20 mg total) by mouth daily. 90 tablet 3  . HYDROmorphone (DILAUDID) 2 MG tablet Take 2 mg by mouth every 4 (four) hours as needed. Per pain clinic    . levothyroxine (SYNTHROID, LEVOTHROID) 150 MCG tablet TAKE 1 TABLET DAILY 90 tablet 1  . ondansetron (ZOFRAN ODT) 4 MG disintegrating tablet 4mg  ODT q4 hours prn nausea/vomit 15 tablet 0  . pantoprazole (PROTONIX) 40 MG tablet TAKE 1 TABLET TWICE A DAY 180 tablet 2  . sodium chloride (OCEAN) 0.65 % SOLN nasal spray Place 1 spray into both nostrils as needed (For nasal dryness.).    Marland Kitchen traZODone (DESYREL) 100 MG tablet Take 2 tablets (200 mg total) by mouth at bedtime. 180 tablet 2  . triamcinolone cream (KENALOG) 0.1 %  Apply 1 application topically 2 (two) times daily as needed (for ezcema).     No current facility-administered medications for this visit.    Allergies as of 11/08/2015 - Review Complete 11/08/2015  Allergen Reaction Noted  . Fentanyl Anaphylaxis 09/25/2012  . Nsaids Other (See Comments) 11/14/2014  .  Codeine Nausea And Vomiting 09/25/2012    Vitals: BP 120/78 mmHg  Pulse 84  Resp 20  Ht 5\' 8"  (1.727 m)  Wt 226 lb (102.513 kg)  BMI 34.37 kg/m2 Last Weight:  Wt Readings from Last 1 Encounters:  11/08/15 226 lb (102.513 kg)   [id number] mass index is 34.37 kg/(m^2).     Last Height:   Ht Readings from Last 1 Encounters:  11/08/15 5\' 8"  (1.727 m)    Physical exam:  General: The patient is awake, alert and appears not in acute distress. The patient is well groomed. Head: Normocephalic, atraumatic. Neck is supple. Mallampati 4,  neck circumference:15. Nasal airflow unrestricted ,  Cardiovascular:  Regular rate and rhythm, without  murmurs or carotid bruit, and without distended neck veins. Respiratory: Lungs are clear to auscultation. Skin:  Without evidence of edema, or rash Trunk: BMI is elevated . The patient's posture is erect  Neurologic exam : The patient is awake and alert, oriented to place and time.    Memory testing deferred [place]: [place] Cognitive Assessment  10/12/2014  Visuospatial/ Executive (0/5) 5  Naming (0/3) 3  Attention: Read list of digits (0/2) 2  Attention: Read list of letters (0/1) 1  Attention: Serial 7 subtraction starting at 100 (0/3) 3  Language: Repeat phrase (0/2) 2  Language : Fluency (0/1) 1  Abstraction (0/2) 2  Delayed Recall (0/5) 5  Orientation (0/6) 6  Total 30   MMSE:No flowsheet data found.     Attention span & concentration ability appears normal.  Speech is fluent,  without  dysarthria, dysphonia or aphasia.  Mood and affect are appropriate.  Cranial nerves: Pupils are equal and briskly reactive to light. Funduscopic  exam without evidence of pallor or edema. Extraocular movements  in vertical and horizontal planes intact and without nystagmus. Visual fields by finger perimetry are intact. Hearing to finger rub intact.   Facial sensation intact to fine touch.  Facial motor strength is symmetric and tongue and uvula move midline. Shoulder shrug was symmetrical.   Motor exam: Normal tone, muscle bulk and symmetric strength in all extremities.   The patient was advised of the nature of the diagnosed sleep disorder , the treatment options and risks for general a health and wellness arising from not treating the condition.  I spent more than 30 minutes of face to face time with the patient. Greater than 50% of time was spent in counseling and coordination of care. We have discussed the diagnosis and differential and I answered the patient's questions.     Assessment:  After physical and neurologic examination, review of laboratory studies,  Personal review of imaging studies, reports of other /same  Imaging studies ,  Results of polysomnography/ neurophysiology testing and pre-existing records as far as provided in visit., my assessment is   1) Mrs. Herringshaw suffers from daily headaches, she wakes up with headaches which are occipital and temporal located she also has another quality of headaches a sharp stabbing sensation to the right eye and this degree of headaches can wake her from sleep. She feels that these are mostly migrainous in nature also is a fulfill cluster headache propriety. I would like for her to undergo a attended sleep study with CPAP to see if she still have hypoxemia, if she needs to be re-titrated for titrated to a oxygen with CPAP in use. I would like for the patient to have at least one hour observed sleep for capnography. Should hypoxemia or hypercapnia not  play a role in her headaches and not be present I will defer to Dr. Jaynee Eagles. Her overall apnea index has been so well controlled on CPAP with a  encouraged her to continue using it.     Plan:  Treatment plan and additional workup : One hour baseline for capnography, followed by CPAP 9 cm with 1 cm EPR and oxygen titration if  hypoxemia is present.     Asencion Partridge Makaiah Terwilliger MD  11/08/2015  CC; Sarina Ill. MD CC: Dickie La, Md [address] [place], [place] [address]

## 2015-11-13 ENCOUNTER — Encounter: Payer: Self-pay | Admitting: Neurology

## 2015-11-13 ENCOUNTER — Ambulatory Visit (INDEPENDENT_AMBULATORY_CARE_PROVIDER_SITE_OTHER): Payer: BLUE CROSS/BLUE SHIELD | Admitting: Neurology

## 2015-11-13 VITALS — BP 110/70 | HR 80 | Ht 68.0 in | Wt 226.8 lb

## 2015-11-13 DIAGNOSIS — G44011 Episodic cluster headache, intractable: Secondary | ICD-10-CM | POA: Diagnosis not present

## 2015-11-13 DIAGNOSIS — G4733 Obstructive sleep apnea (adult) (pediatric): Secondary | ICD-10-CM

## 2015-11-13 DIAGNOSIS — M6289 Other specified disorders of muscle: Secondary | ICD-10-CM

## 2015-11-13 DIAGNOSIS — Z9989 Dependence on other enabling machines and devices: Secondary | ICD-10-CM

## 2015-11-13 DIAGNOSIS — R531 Weakness: Secondary | ICD-10-CM

## 2015-11-13 DIAGNOSIS — R4701 Aphasia: Secondary | ICD-10-CM

## 2015-11-13 MED ORDER — TOPIRAMATE 25 MG PO TABS: 100.0000 mg | ORAL_TABLET | Freq: Every day | ORAL | Status: DC

## 2015-11-13 MED FILL — HYDROmorphone HCL 2 MG TABS: 2 | 30 days supply | Qty: 180 | Fill #0

## 2015-11-13 MED FILL — TOPIRAMATE 25 MG TABLET: 25 | 30 days supply | Qty: 120 | Fill #0

## 2015-11-13 NOTE — Progress Notes (Addendum)
[id number]    Provider:  Dr Jaynee Eagles Referring Provider: Dickie La, MD Primary Care Physician:  Dorcas Mcmurray, MD  CC:  headaches  HPI:  Rebecca Marshall is a 65 y.o. female here as a referral from Dr. Nori Riis for cluster headaches and episodic left sided weakness. She was previously seen for memory changes which are greatly improved with the diagnosis of obstructive sleep apnea and uses CPAP. She has a past medical history of atrial dysrhythmia, fibromyalgia, anxiety, depression, thyroid disease, sleep apnea and migraines.  The headaches started 8 months ago, abrupt onset, right eye, stabbing sharp, wakes her up in the middle of the night. She also gets them during the day. Lasts for a few hours. Waxes and wanes but can last for hours. Lots of stabs in a minute. She gets tearing of the eye, nausea, vomiting, tearing and ptosis. It has been happening every single day.  no vision changes, or other focal neurologic deficits with these headaches. She is also here for second new problem which includes episodic left sided weakness and aphasia. She has strange episodes  she has left-sided weakness and -she stumbles, she mistakes  words, she calls something another word. Her whole left side is weak, it happens and lasts a whole day. Her left side is weak, no associated headaches, facial droop or other neurologic deficits such as numbness and tingling in . Happens every 5-6 weeks. This has been going on for several years. She can't speak right, she chokes on fluids and she is messed up for the whole day when symptoms happen. No headaches with the episodes.    Reviewed notes, labs and imaging from outside physicians, which showed:  Personally reviewed CT of the head June 2016 and agree with the following findings: There is no intracranial hemorrhage, mass or evidence of acute infarction. There is no extra-axial fluid collection. Gray matter and white matter appear normal. Cerebral volume is  normal for age. Brainstem and posterior fossa are unremarkable. The CSF spaces appear normal.  The bony structures are intact. The visible portions of the paranasal sinuses are clear.  IMPRESSION: Normal brain  Review of Systems: Patient complains of symptoms per HPI as well as the following symptoms: No chest pain or shortness of breath  Pertinent negatives per HPI. All others negative.   Social History   Social History  . Marital Status: Married    Spouse Name: [name]  . Number of Children: 3  . Years of Education: 12+   Occupational History  . unemployed     Social History Main Topics  . Smoking status: Never Smoker   . Smokeless tobacco: Never Used  . Alcohol Use: No     Comment: none  . Drug Use: No  . Sexual Activity: Not on file   Other Topics Concern  . Not on file   Social History Narrative   Patient lives at home alone    Patient is married    Patient is not working    Patient has 3 children    Patient has her BSN RN   Patient is right handed       Daily caffeine use 1 cup daily.    Family History  Problem Relation Age of Onset  . Cancer Other   . Alcohol abuse Mother   . Cancer Mother 35    Lung   . Alcohol abuse Father   . Cancer Sister 90    Breast    Past Medical  History  Diagnosis Date  . Atrial dysrhythmia   . Fibromyalgia   . Cancer [facility])     thyroid-status post resection on replacement Dr. Chalmers Cater  . Anxiety   . Depression   . Thyroid disease   . GE reflux   . Arthritis   . Osteoporosis   . Sleep apnea 10/2015    x 1 year, CPAP  . Headache     migraines    Past Surgical History  Procedure Laterality Date  . Cholecystectomy    . Thyroidectomy  04/2009    papillary carcimao Dr. Erik Obey  . Total hip arthroplasty  2009 and 2011    L hip   . Tubal ligation    . Abdominal hysterectomy    . Colonoscopy  04/2011  . Toe surgery      bone spur removed - rt great toe  . Cataract extraction Bilateral 09/05/15, 09/12/15     Current Outpatient Prescriptions  Medication Sig Dispense Refill  . calcium-vitamin D (OSCAL WITH D) 500-200 MG-UNIT per tablet Take 1 tablet by mouth every morning.    . eletriptan (RELPAX) 40 MG tablet Take by mouth one at the beginning of headache and may repeat once in 2  Hours for maximum of 2 tabs in 24hours 27 tablet 3  . flecainide (TAMBOCOR) 150 MG tablet Take 1 tablet (150 mg total) by mouth 2 (two) times daily. 180 tablet 3  . FLUoxetine (PROZAC) 20 MG tablet Take 1 tablet (20 mg total) by mouth daily. 90 tablet 3  . HYDROmorphone (DILAUDID) 2 MG tablet Take 2 mg by mouth every 4 (four) hours as needed. Per pain clinic    . levothyroxine (SYNTHROID, LEVOTHROID) 150 MCG tablet TAKE 1 TABLET DAILY 90 tablet 1  . ondansetron (ZOFRAN ODT) 4 MG disintegrating tablet 4mg  ODT q4 hours prn nausea/vomit 15 tablet 0  . pantoprazole (PROTONIX) 40 MG tablet TAKE 1 TABLET TWICE A DAY 180 tablet 2  . sodium chloride (OCEAN) 0.65 % SOLN nasal spray Place 1 spray into both nostrils as needed (For nasal dryness.).    Marland Kitchen traZODone (DESYREL) 100 MG tablet Take 2 tablets (200 mg total) by mouth at bedtime. 180 tablet 2  . triamcinolone cream (KENALOG) 0.1 % Apply 1 application topically 2 (two) times daily as needed (for ezcema).    . diazepam (VALIUM) 10 MG tablet Take 0.5 tablets (5 mg total) by mouth every 12 (twelve) hours as needed for anxiety. (Patient not taking: Reported on 11/13/2015) 30 tablet 4   No current facility-administered medications for this visit.    Allergies as of 11/13/2015 - Review Complete 11/13/2015  Allergen Reaction Noted  . Fentanyl Anaphylaxis 09/25/2012  . Nsaids Other (See Comments) 11/14/2014  . Codeine Nausea And Vomiting 09/25/2012    Vitals: BP 110/70 mmHg  Pulse 80  Ht 5\' 8"  (1.727 m)  Wt 226 lb 12.8 oz (102.876 kg)  BMI 34.49 kg/m2 Last Weight:  Wt Readings from Last 1 Encounters:  11/13/15 226 lb 12.8 oz (102.876 kg)   Last Height:   Ht Readings  from Last 1 Encounters:  11/13/15 5\' 8"  (1.727 m)    Physical exam: Exam: Gen: NAD, conversant, well nourised, obese, well groomed                     Eyes: Conjunctivae clear without exudates or hemorrhage  Neuro: Detailed Neurologic Exam  Speech:    Speech is normal; fluent and spontaneous with normal comprehension.  Cognition:  The patient is oriented to person, place, and time;     recent and remote memory intact;     language fluent;     normal attention, concentration,     fund of knowledge Cranial Nerves:    The pupils are equal, round, and reactive to light.  Visual fields are full to finger confrontation. Extraocular movements are intact. Trigeminal sensation is intact and the muscles of mastication are normal. The face is symmetric. The palate elevates in the midline. Hearing intact. Voice is normal. Shoulder shrug is normal. The tongue has normal motion without fasciculations.   Motor Observation:    No asymmetry, no atrophy, and no involuntary movements noted. Tone:    Normal muscle tone.    Posture:    Posture is normal. normal erect    Strength:    Strength is V/V in the upper and lower limbs.      Sensation: intact to LT       Assessment/Plan:   This is a lovely 65 year old female here for cluster headaches as well as episodic left sided weakness and aphasia.   Cluster headaches: Verapamil is first line therapy for cluster headaches however given patient's atrial dysrhythmia in her low blood pressures, this medication is not appropriate. We'll try Topamax. We'll also order home oxygen were acute attacks and see if we can get [facility] to compound some lidocaine for her for acute attacks. Today we will perform sphenopalatine ganglion block procedure.  Episodic left-sided weakness: Not consistent with seizure activity. May be episodes of TIA. We'll do a complete stroke workup including MRI of the brain and MRA of the head. She sees Dr. Caryl Comes and will  ask him for an echocardiogram and carotid Dopplers. Daily ASA 81mg . Patient reports her LDL is 103, suggested starting low-dose Lipitor but she declines. Hemoglobin A1c has been low at 5.2. Patient has low blood pressure, symptoms could be secondary to hypotension as she feels better when drinking more water. Advised her to hydrate well and take her blood pressure She has a left-sided weakness.   obstructive sleep apnea: Continue to follow with Dr. Roddie Mc and encouraged CPAP compliance.   Discussed side effects topamax. Serious side effects can inclue: Abdominal or stomach pain ,fever, chills, or sore throat , lessening of sensations or perception ,loss of appetite , mood or mental changes, including aggression, agitation, apathy, irritability, and mental depression , red, irritated, or bleeding gums , weight loss Rare Blood in the urine , decrease in sexual performance or desire , difficult or painful urination , frequent urination , hearing loss , loss of bladder control , lower back or side pain , nosebleeds , pale skin , red or irritated eyes , ringing or buzzing in the ears , skin rash or itching , swelling , trouble breathing, Common side effects of Topamax include:  tiredness, drowsiness, dizziness, nervousness, numbness or tingly feeling, coordination problems, diarrhea, weight loss, speech/language problems, changes in vision, sensory distortion, loss of appetite, bad taste in your mouth, confusion, slowed thinking, trouble concentrating or paying attention, memory problems,    To prevent or relieve headaches, try the following: Cool Compress. Lie down and place a cool compress on your head.  Avoid headache triggers. If certain foods or odors seem to have triggered your migraines in the past, avoid them. A headache diary might help you identify triggers.  Include physical activity in your daily routine. Try a daily walk or other moderate aerobic exercise.  Manage stress. Find healthy  ways to  cope with the stressors, such as delegating tasks on your to-do list.  Practice relaxation techniques. Try deep breathing, yoga, massage and visualization.  Eat regularly. Eating regularly scheduled meals and maintaining a healthy diet might help prevent headaches. Also, drink plenty of fluids.  Follow a regular sleep schedule. Sleep deprivation might contribute to headaches Consider biofeedback. With this mind-body technique, you learn to control certain bodily functions - such as muscle tension, heart rate and blood pressure - to prevent headaches or reduce headache pain.    Proceed to emergency room if you experience new or worsening symptoms or symptoms do not resolve, if you have new neurologic symptoms or if headache is severe, or for any concerning symptom.       [facility] PROCEDURE NOTE  Procedure: The patient was placed in the supine position. A temperature strip was added to the cheek area after the area was cleaned with alcohol. The Sphenocath was lubricated with gel, and placed in the right naris. The catheter was inserted above the middle turbinate to the posterior nasal cavity, and then withdrawn 1 cm. The catheter was deployed and rotated approximately 20 towards the nose. 2-1/2 mL of 2% lidocaine was deployed. The patient was asked to swallow during the injection. The patient demonstrated erythema of the sclera of the eye on this side, and an increase in the cheek temperature was noted from 96 F to 98 F.  This process was repeated on the left side, with similar results. The increase in cheek temperature was documented from 60 F to 96 F.  The patient tolerated the procedure well. No complications of the procedure were noted. The patient was kept in the supine position for 8 minutes following the procedure. She was given small sips of water after sitting up following the procedure.  Lidocaine 2% NDC [id number]  Expiration date: [id number] Lot number: 11/19  Marisa Severin, MD  [facility] [address] [place], [place] [address]  Phone [phone] Fax [phone]  A total of 45 minutes was spent face-to-face with this patient. Over half this time was spent on counseling patient on the cluster headache and left-sided weakness diagnosis and different diagnostic and therapeutic options available.

## 2015-11-13 NOTE — Patient Instructions (Addendum)
Remember to drink plenty of fluid, eat healthy meals and do not skip any meals. Try to eat protein with a every meal and eat a healthy snack such as fruit or nuts in between meals. Try to keep a regular sleep-wake schedule and try to exercise daily, particularly in the form of walking, 20-30 minutes a day, if you can.    Stroke workup: MRi of the brain and blood vessels. Dr. Caryl Comes to perform echo and carotids.  As far as your medications are concerned, I would like to suggest: Week 1: 25 mg at night Week 2 50mg  Week 3: 75mg  Week 4: 100mg  Start baby aspirin daily Consider lipitor 10mg   Oxygen Call [facility] and se eif they can get some lidocaine nose spray  Our phone number is [phone]. We also have an after hours call service for urgent matters and there is a physician on-call for urgent questions. For any emergencies you know to call 911 or go to the nearest emergency room

## 2015-11-14 ENCOUNTER — Telehealth: Payer: Self-pay | Admitting: Neurology

## 2015-11-14 NOTE — Telephone Encounter (Signed)
Can you call [facility] and see if they can create a lidocaine nose spray (do they have to "compound" it?")  for cluster headache users. Also can they place 4% lidocaine solution into a nasal spray  to use intranasally as an abortive therapy. Four sprays of lidocaine ipsilateral to the pain, and two more if necessary  Also, she needs O2 for cluster headaches.  Oxygen, the safest of all cluster therapies, is usually prescribed patients  use 100% oxygen via a nonrebreather face mask at 7-12 liters/minute for no more than 10-15 minutes at a time.

## 2015-11-15 NOTE — Telephone Encounter (Signed)
[facility]. They are not able to do a compounded lidocaine nasal spray. She gave me number to [facility] on [facility]: [phone] to call to see if they can do this.

## 2015-11-15 NOTE — Telephone Encounter (Signed)
Faxed order signed by Dr Felecia Shelling (covering provider since Dr Jaynee Eagles out of office until Monday) to [facility]. FaxHA:[phone]. Received confirmation. Sent copy to MR.

## 2015-11-15 NOTE — Telephone Encounter (Addendum)
[facility] called back and stated they can actually do nasal spray solution. They would need pt name. 30 mL. No refills. 4% lidocaine solution into a nasal spray to use intranasally as abortive therapy. 4 sprays on side of pain and 2 more sprays if necessary. She is going to call pt and get her information and let her know when its ready to be picked up. She will call us if she needs future refills.

## 2015-11-22 HISTORY — PX: OTHER SURGICAL HISTORY: SHX169

## 2015-11-30 ENCOUNTER — Telehealth: Payer: Self-pay | Admitting: Internal Medicine

## 2015-11-30 DIAGNOSIS — R531 Weakness: Secondary | ICD-10-CM

## 2015-11-30 DIAGNOSIS — R4701 Aphasia: Secondary | ICD-10-CM

## 2015-11-30 DIAGNOSIS — I471 Supraventricular tachycardia: Secondary | ICD-10-CM

## 2015-11-30 NOTE — Telephone Encounter (Signed)
Message received from Dr. Jaynee Eagles that he has recently seen the patient and that she is having episodic left-sided weakness and aphasia. He recommended she have an echo and carotid duplex done, but the patient wanted these ordered through our office- ok per Dr. Caryl Comes. I spoke with the patient today about this and is agreeable with testing being ordered. I advised her that we will try to get these scheduled prior to her follow up with Dr. Caryl Comes on 3/21. She is agreeable.

## 2015-12-06 ENCOUNTER — Ambulatory Visit (INDEPENDENT_AMBULATORY_CARE_PROVIDER_SITE_OTHER): Payer: BLUE CROSS/BLUE SHIELD

## 2015-12-06 ENCOUNTER — Ambulatory Visit (HOSPITAL_COMMUNITY)
Admission: RE | Admit: 2015-12-06 | Discharge: 2015-12-06 | Disposition: A | Discharge status: Home / Self Care | Payer: BLUE CROSS/BLUE SHIELD | Source: Ambulatory Visit | Attending: Internal Medicine | Admitting: Internal Medicine

## 2015-12-06 DIAGNOSIS — R531 Weakness: Secondary | ICD-10-CM

## 2015-12-06 DIAGNOSIS — M6289 Other specified disorders of muscle: Secondary | ICD-10-CM | POA: Diagnosis not present

## 2015-12-06 DIAGNOSIS — I471 Supraventricular tachycardia: Secondary | ICD-10-CM

## 2015-12-06 DIAGNOSIS — I351 Nonrheumatic aortic (valve) insufficiency: Secondary | ICD-10-CM | POA: Diagnosis not present

## 2015-12-06 DIAGNOSIS — I5189 Other ill-defined heart diseases: Secondary | ICD-10-CM | POA: Insufficient documentation

## 2015-12-06 DIAGNOSIS — I517 Cardiomegaly: Secondary | ICD-10-CM | POA: Diagnosis not present

## 2015-12-06 DIAGNOSIS — R002 Palpitations: Secondary | ICD-10-CM | POA: Diagnosis present

## 2015-12-06 DIAGNOSIS — R4701 Aphasia: Secondary | ICD-10-CM

## 2015-12-08 ENCOUNTER — Telehealth: Payer: Self-pay | Admitting: *Deleted

## 2015-12-08 NOTE — Telephone Encounter (Signed)
Dr Jaynee Eagles- please advise  Called and spoke to pt about MRI/MRA results per Dr Jaynee Eagles note. Pt verbalized understanding. She would like to know if it showed any old strokes. Advised I will ask Dr Jaynee Eagles and call her back Monday. Dr Jaynee Eagles not in the office today. She understands.

## 2015-12-08 NOTE — Telephone Encounter (Signed)
-----   Message from Melvenia Beam, MD sent at 12/07/2015 10:41 PM EDT ----- MRI of the brain did not show any new stroke or any other new patholofy. In fact no changes from MRI done over a year ago January 206. MRA of the head normal. thanks

## 2015-12-11 NOTE — Telephone Encounter (Signed)
No old strokes, happy to review in the office. thanks

## 2015-12-11 NOTE — Telephone Encounter (Signed)
Called pt and relayed Dr Jaynee Eagles message below. She verbalized understanding and declined appt to come in and review images at this time. She knows to call back if she changes her mind.

## 2015-12-12 ENCOUNTER — Ambulatory Visit (INDEPENDENT_AMBULATORY_CARE_PROVIDER_SITE_OTHER): Payer: BLUE CROSS/BLUE SHIELD | Admitting: Internal Medicine

## 2015-12-12 ENCOUNTER — Encounter: Payer: Self-pay | Admitting: Internal Medicine

## 2015-12-12 ENCOUNTER — Encounter: Payer: Self-pay | Admitting: Family Medicine

## 2015-12-12 VITALS — BP 112/78 | HR 86 | Ht 68.0 in | Wt 224.0 lb

## 2015-12-12 DIAGNOSIS — R000 Tachycardia, unspecified: Secondary | ICD-10-CM | POA: Diagnosis not present

## 2015-12-12 DIAGNOSIS — R002 Palpitations: Secondary | ICD-10-CM

## 2015-12-12 DIAGNOSIS — I471 Supraventricular tachycardia: Secondary | ICD-10-CM | POA: Diagnosis not present

## 2015-12-12 DIAGNOSIS — R001 Bradycardia, unspecified: Secondary | ICD-10-CM

## 2015-12-12 DIAGNOSIS — R079 Chest pain, unspecified: Secondary | ICD-10-CM

## 2015-12-12 MED ORDER — FLECAINIDE ACETATE 150 MG PO TABS: 150.0000 mg | ORAL_TABLET | Freq: Two times a day (BID) | ORAL | Status: DC

## 2015-12-12 NOTE — Progress Notes (Signed)
Patient Care Team: Dickie La, MD as PCP - General   HPI  Rebecca Marshall is a 65 y.o. female Seen in follow-up for atrial tachycardia for which she has been treated with flecainide.  Over recent months she has noted more problems with palpitations. These are associated with lightheadedness and shortness of breath and some presyncope. There have been 3 distinct syndromes. The first is a hard rapid beat associated with lightheadedness. She also has "flutters" these are associated primarily with shortness of breath. In the third is that she has had sustained tachycardia; apparently she had a heart rate of 220 bpm month or 2 ago. This was the first sustained in a long time.  She has also had some issues with slow heart rates, i.e. in the 50s  We tried at last visit to switch her from flecainide--propafenone; this was poorly tolerated. She has resumed flecainide at 200 mg a day with now just a few symptoms.    DATE PR interval QRSduration Dose  7/16 21 118 100  3/17 17 09 150   neruolgoy has seen pt with concerns of L sided weakness  Asked for echo>>with LA enlargement, not infrequently seen with atrail arrhtyhmias  Episode previously of chest and neck pain---no prior myoview or GXT  palps are under good enough control that she is satisfied      Past Medical History  Diagnosis Date  . Atrial dysrhythmia   . Fibromyalgia   . Cancer [facility])     thyroid-status post resection on replacement Dr. Chalmers Cater  . Anxiety   . Depression   . Thyroid disease   . GE reflux   . Arthritis   . Osteoporosis   . Sleep apnea 10/2015    x 1 year, CPAP  . Headache     migraines    Past Surgical History  Procedure Laterality Date  . Cholecystectomy    . Thyroidectomy  04/2009    papillary carcimao Dr. Erik Obey  . Total hip arthroplasty  2009 and 2011    L hip   . Tubal ligation    . Abdominal hysterectomy    . Colonoscopy  04/2011  . Toe surgery      bone spur removed - rt great toe   . Cataract extraction Bilateral 09/05/15, 09/12/15  . Caroitid dopplers Bilateral 11/2015    normal    Current Outpatient Prescriptions  Medication Sig Dispense Refill  . calcium-vitamin D (OSCAL WITH D) 500-200 MG-UNIT per tablet Take 1 tablet by mouth every morning.    . diazepam (VALIUM) 10 MG tablet Take 0.5 tablets (5 mg total) by mouth every 12 (twelve) hours as needed for anxiety. 30 tablet 4  . eletriptan (RELPAX) 40 MG tablet Take by mouth one at the beginning of headache and may repeat once in 2  Hours for maximum of 2 tabs in 24hours 27 tablet 3  . flecainide (TAMBOCOR) 150 MG tablet Take 1 tablet (150 mg total) by mouth 2 (two) times daily. 180 tablet 3  . FLUoxetine (PROZAC) 20 MG tablet Take 1 tablet (20 mg total) by mouth daily. 90 tablet 3  . HYDROmorphone (DILAUDID) 2 MG tablet Take 2 mg by mouth every 4 (four) hours as needed. Per pain clinic    . levothyroxine (SYNTHROID, LEVOTHROID) 150 MCG tablet TAKE 1 TABLET DAILY 90 tablet 1  . ondansetron (ZOFRAN ODT) 4 MG disintegrating tablet 4mg  ODT q4 hours prn nausea/vomit 15 tablet 0  . OXYGEN Inhale  into the lungs. 100% oxygen via a nonrebreather face mask at 7-12 liters/minute for no more than 10-15 minutes at a time for cluster headaches.    . pantoprazole (PROTONIX) 40 MG tablet TAKE 1 TABLET TWICE A DAY 180 tablet 2  . sodium chloride (OCEAN) 0.65 % SOLN nasal spray Place 1 spray into both nostrils as needed (For nasal dryness.).    Marland Kitchen topiramate (TOPAMAX) 25 MG tablet Take 4 tablets (100 mg total) by mouth at bedtime. (Patient taking differently: Take 50 mg by mouth daily. ) 120 tablet 12  . traZODone (DESYREL) 100 MG tablet Take 2 tablets (200 mg total) by mouth at bedtime. 180 tablet 2  . triamcinolone cream (KENALOG) 0.1 % Apply 1 application topically 2 (two) times daily as needed (for ezcema).     No current facility-administered medications for this visit.    Allergies  Allergen Reactions  . Fentanyl Anaphylaxis    . Nsaids Other (See Comments)    Rectal bleeding  . Codeine Nausea And Vomiting    Review of Systems negative except from HPI and PMH  Physical Exam BP 112/78 mmHg  Pulse 86  Ht 5\' 8"  (1.727 m)  Wt 224 lb (101.606 kg)  BMI 34.07 kg/m2 Well developed and well nourished in no acute distress HENT normal E scleral and icterus clear Neck Supple JVP flat; carotids brisk and full Clear to ausculation  *Regular rate and rhythm, no murmurs gallops or rub Soft with active bowel sounds No clubbing cyanosis  Edema Alert and oriented, grossly normal motor and sensory function Skin Warm and Dry   Outside records reviewed demonstrating normal laboratories from emergency room visit 6/16 ER notes were also reviewed   ECG demonstrates sinus rhythm at 66 Intervals17/09/39 Left axis deviation -53 Poor R-wave progression  this is unchanged from 2015    Assessment and  Plan  Obstructive sleep apnea-treated  Fatigue  Bradycardia-reported  Atrial tachycardia/palpitations-recurrent  Chronic pain -diluadid   Palpitations are better and adeqyuately so to continue current course Needs GXT Myoview with episode of chest pain and no prior GXT

## 2015-12-12 NOTE — Patient Instructions (Addendum)
Medication Instructions: - Your physician recommends that you continue on your current medications as directed. Please refer to the Current Medication list given to you today.  Labwork: - none  Procedures/Testing: - Your physician has requested that you have an exercise stress myoview. For further information please visit [email or web address]. Please follow instruction sheet, as given.  Follow-Up: - Your physician wants you to follow-up in: 6 months with Tommye Standard, PA for Dr. Caryl Comes. You will receive a reminder letter in the mail two months in advance. If you don't receive a letter, please call our office to schedule the follow-up appointment.  Any Additional Special Instructions Will Be Listed Below (If Applicable).     If you need a refill on your cardiac medications before your next appointment, please call your pharmacy.

## 2015-12-13 ENCOUNTER — Ambulatory Visit (HOSPITAL_COMMUNITY): Payer: BLUE CROSS/BLUE SHIELD | Attending: Cardiology

## 2015-12-13 DIAGNOSIS — R9439 Abnormal result of other cardiovascular function study: Secondary | ICD-10-CM | POA: Insufficient documentation

## 2015-12-13 DIAGNOSIS — E669 Obesity, unspecified: Secondary | ICD-10-CM | POA: Diagnosis not present

## 2015-12-13 DIAGNOSIS — R079 Chest pain, unspecified: Secondary | ICD-10-CM

## 2015-12-13 DIAGNOSIS — Z6834 Body mass index (BMI) 34.0-34.9, adult: Secondary | ICD-10-CM | POA: Diagnosis not present

## 2015-12-13 DIAGNOSIS — R002 Palpitations: Secondary | ICD-10-CM | POA: Diagnosis not present

## 2015-12-13 DIAGNOSIS — R55 Syncope and collapse: Secondary | ICD-10-CM | POA: Insufficient documentation

## 2015-12-13 LAB — MYOCARDIAL PERFUSION IMAGING
Estimated workload: 7 METS
Exercise duration (min): 6 min
Exercise duration (sec): 0 s
LV dias vol: 97 mL (ref 46–106)
LV sys vol: 27 mL
MPHR: 157 {beats}/min
Peak HR: 137 {beats}/min
Percent HR: 87 %
RATE: 0.34
RPE: 18
Rest HR: 64 {beats}/min
SDS: 2
SRS: 1
SSS: 3
TID: 0.82

## 2015-12-13 MED ORDER — TECHNETIUM TC 99M SESTAMIBI GENERIC - CARDIOLITE
32.6000 | Freq: Once | INTRAVENOUS | Status: AC | PRN
  Administered 2015-12-13: 33 via INTRAVENOUS

## 2015-12-13 MED ORDER — TECHNETIUM TC 99M SESTAMIBI GENERIC - CARDIOLITE
10.6000 | Freq: Once | INTRAVENOUS | Status: AC | PRN
  Administered 2015-12-13: 11 via INTRAVENOUS

## 2016-01-03 ENCOUNTER — Telehealth: Payer: Self-pay | Admitting: *Deleted

## 2016-01-03 MED ORDER — TOPIRAMATE 25 MG PO TABS: 100.0000 mg | ORAL_TABLET | Freq: Every day | ORAL | Status: DC

## 2016-01-03 NOTE — Telephone Encounter (Addendum)
Request from pt to use [facility] (90 day supply).  Done.

## 2016-01-09 ENCOUNTER — Encounter: Payer: Self-pay | Admitting: Family Medicine

## 2016-01-09 ENCOUNTER — Ambulatory Visit (INDEPENDENT_AMBULATORY_CARE_PROVIDER_SITE_OTHER): Payer: BLUE CROSS/BLUE SHIELD | Admitting: Internal Medicine

## 2016-01-09 ENCOUNTER — Encounter: Payer: Self-pay | Admitting: Internal Medicine

## 2016-01-09 ENCOUNTER — Ambulatory Visit: Payer: BLUE CROSS/BLUE SHIELD | Admitting: Family Medicine

## 2016-01-09 VITALS — BP 110/78 | HR 85 | Temp 98.1°F | Wt 228.0 lb

## 2016-01-09 DIAGNOSIS — R234 Changes in skin texture: Secondary | ICD-10-CM

## 2016-01-09 DIAGNOSIS — R21 Rash and other nonspecific skin eruption: Secondary | ICD-10-CM

## 2016-01-09 HISTORY — DX: Rash and other nonspecific skin eruption: R21

## 2016-01-09 NOTE — Progress Notes (Signed)
Patient ID: Rebecca Marshall Flatten, female   DOB: 1950/11/04, 65 y.o.   MRN: [id number] Date of Visit: 01/09/2016   HPI:  Patient is here for rash on her right breast  Rash on Right Breast:  - noticed 5-6 months ago  - is getting larger and "fuller"  - does not look like her other stretch marks - has tried triamcinolone cream and hydrocortisone cream which have not helped - had a diagnostic mammogram (was changed from a screening to diagnostic due to this rash) which was normal - no weight loss - no nipple discharge  - mildly pruritic   ROS: See HPI.  [place]:  Sister was diagnosed with breast cancer  PHYSICAL EXAM: BP 110/78 mmHg  Pulse 85  Temp(Src) 98.1 F (36.7 C) (Oral)  Wt 228 lb (103.42 kg) GEN: NAD Breast:  Right Breast- 9cm by 5.5cm area of mildly erythematous macule although it may be slightly raised, with irregular borders, skin texture is slightly rough compared to other areas of breast, no scaling but does blanch, nontender, no increased warmth. This is located in the 10 o'clock position. No other changes noted on breast    ASSESSMENT/PLAN:  Rash and nonspecific skin eruption Growing in size and texture per patient. Normal diagnostic mammogram in 09/2015. Does not seem infectious or classically malignant. Discussed options to further evaluate with either punch biopsy vs referral to dermatology. Patient decided on referral - referral to dermatology made     FOLLOW UP: Follow up PRN  Smiley Houseman, MD PGY [place]

## 2016-01-09 NOTE — Assessment & Plan Note (Signed)
Growing in size and texture per patient. Normal diagnostic mammogram in 09/2015. Does not seem infectious or classically malignant. Discussed options to further evaluate with either punch biopsy vs referral to dermatology. Patient decided on referral - referral to dermatology made

## 2016-01-09 NOTE — Patient Instructions (Signed)
Thank you for coming in. I made a referral to dermatology. Their clinic will get in touch with you to set up an appointment. If you do not hear from them in about 1.5 weeks give [place] a call back.

## 2016-01-15 ENCOUNTER — Encounter: Payer: Self-pay | Admitting: Family Medicine

## 2016-01-22 DIAGNOSIS — D2310 Other benign neoplasm of skin of unspecified eyelid, including canthus: Secondary | ICD-10-CM | POA: Diagnosis not present

## 2016-01-22 DIAGNOSIS — H26492 Other secondary cataract, left eye: Secondary | ICD-10-CM | POA: Diagnosis not present

## 2016-01-22 DIAGNOSIS — Z961 Presence of intraocular lens: Secondary | ICD-10-CM | POA: Diagnosis not present

## 2016-01-24 ENCOUNTER — Encounter: Payer: Self-pay | Admitting: Family Medicine

## 2016-01-29 DIAGNOSIS — L988 Other specified disorders of the skin and subcutaneous tissue: Secondary | ICD-10-CM | POA: Diagnosis not present

## 2016-01-29 DIAGNOSIS — L814 Other melanin hyperpigmentation: Secondary | ICD-10-CM | POA: Diagnosis not present

## 2016-01-29 DIAGNOSIS — R21 Rash and other nonspecific skin eruption: Secondary | ICD-10-CM | POA: Diagnosis not present

## 2016-01-29 DIAGNOSIS — D2262 Melanocytic nevi of left upper limb, including shoulder: Secondary | ICD-10-CM | POA: Diagnosis not present

## 2016-01-29 DIAGNOSIS — D485 Neoplasm of uncertain behavior of skin: Secondary | ICD-10-CM | POA: Diagnosis not present

## 2016-01-29 DIAGNOSIS — L570 Actinic keratosis: Secondary | ICD-10-CM | POA: Diagnosis not present

## 2016-01-29 DIAGNOSIS — D2261 Melanocytic nevi of right upper limb, including shoulder: Secondary | ICD-10-CM | POA: Diagnosis not present

## 2016-01-30 ENCOUNTER — Other Ambulatory Visit: Payer: Self-pay | Admitting: Family Medicine

## 2016-01-30 ENCOUNTER — Ambulatory Visit: Payer: BLUE CROSS/BLUE SHIELD | Admitting: Neurology

## 2016-01-30 DIAGNOSIS — C73 Malignant neoplasm of thyroid gland: Secondary | ICD-10-CM

## 2016-01-30 NOTE — Progress Notes (Signed)
Appointment is scheduled for 02/14/16 with an arrival time at 9:40, at [facility].  Pt was told to stop taking calcium supplements  2 days prior. Pt was notified by phone of this information. Katharina Caper, Iriel Nason D, [place]

## 2016-01-30 NOTE — Progress Notes (Signed)
Dear [name] Team Can u schedule this for her [name]! Rebecca Marshall

## 2016-02-06 DIAGNOSIS — G894 Chronic pain syndrome: Secondary | ICD-10-CM | POA: Diagnosis not present

## 2016-02-06 DIAGNOSIS — M47816 Spondylosis without myelopathy or radiculopathy, lumbar region: Secondary | ICD-10-CM | POA: Diagnosis not present

## 2016-02-06 DIAGNOSIS — M150 Primary generalized (osteo)arthritis: Secondary | ICD-10-CM | POA: Diagnosis not present

## 2016-02-06 DIAGNOSIS — Z79891 Long term (current) use of opiate analgesic: Secondary | ICD-10-CM | POA: Diagnosis not present

## 2016-02-14 ENCOUNTER — Ambulatory Visit
Admission: RE | Admit: 2016-02-14 | Discharge: 2016-02-14 | Disposition: A | Discharge status: Home / Self Care | Payer: BLUE CROSS/BLUE SHIELD | Source: Ambulatory Visit | Attending: Family Medicine | Admitting: Family Medicine

## 2016-02-14 DIAGNOSIS — C73 Malignant neoplasm of thyroid gland: Secondary | ICD-10-CM

## 2016-02-14 DIAGNOSIS — Z780 Asymptomatic menopausal state: Secondary | ICD-10-CM | POA: Diagnosis not present

## 2016-02-14 DIAGNOSIS — M8589 Other specified disorders of bone density and structure, multiple sites: Secondary | ICD-10-CM | POA: Diagnosis not present

## 2016-02-20 ENCOUNTER — Encounter: Payer: Self-pay | Admitting: Family Medicine

## 2016-02-20 MED FILL — HYDROmorphone HCL 2 MG TABS: 2 | 30 days supply | Qty: 180 | Fill #0

## 2016-03-06 ENCOUNTER — Ambulatory Visit (INDEPENDENT_AMBULATORY_CARE_PROVIDER_SITE_OTHER): Payer: BLUE CROSS/BLUE SHIELD | Admitting: Neurology

## 2016-03-06 ENCOUNTER — Encounter: Payer: Self-pay | Admitting: Neurology

## 2016-03-06 DIAGNOSIS — G44209 Tension-type headache, unspecified, not intractable: Secondary | ICD-10-CM | POA: Diagnosis not present

## 2016-03-06 DIAGNOSIS — F32A Depression, unspecified: Secondary | ICD-10-CM | POA: Insufficient documentation

## 2016-03-06 DIAGNOSIS — R51 Headache: Principal | ICD-10-CM

## 2016-03-06 DIAGNOSIS — F329 Major depressive disorder, single episode, unspecified: Secondary | ICD-10-CM | POA: Diagnosis not present

## 2016-03-06 DIAGNOSIS — G4489 Other headache syndrome: Secondary | ICD-10-CM

## 2016-03-06 HISTORY — DX: Depression, unspecified: F32.A

## 2016-03-06 NOTE — Progress Notes (Signed)
SLEEP MEDICINE CLINIC   Provider:  Larey Seat, M D  Referring Provider:  Sarina Ill, MD  Primary Care Physician:  Dorcas Mcmurray, MD  No chief complaint on file.   HPI:  Rebecca Marshall. Marshall is a 65 y.o. female , seen here as a patient of Dr. Ferdinand Lango and has seen Dr Jannifer Franklin, with headaches.  She has undergone a sleep evaluation in 2015-16 and wonders if her CPAP contributes to the problem.  [name] underwent a sleep study evaluation on 10/23/2014, at the time endorsing the Epworth sleepiness score at 14 points and the depression inventory at 7 points. She was diagnosed with an AHI of 20.2 the lowest oxygen saturation was 83% with 190 minutes of desaturation time she did not produce any tachycardia or bradycardia arrhythmia. She was titrated to 8 cm water was 1 cm flex function and was placed on a pico Respironics mask medium size the AHI at this pressure was 0.0 and she slept 56 minutes with a machine at this pressure.  She had previously seen Dr. Jaynee Eagles for memory loss and a sleep evaluation was also necessitated by her frequent headaches. Dr. Jaynee Eagles ordered an MRI of the brain, a neuropsychological evaluation, initiated a dementia workup and had ordered a sleep study she also order an EEG which was interpreted by Dr. Jannifer Franklin. In the meantime she has 100% compliance for days of CPAP use and 80% compliance for over 4 hours of nightly use. Average user time is 6 hours. The machine is currently set at 9 cm water pressure was 1 cm EPR she has minor air leaks and an excellent resolution of apnea, the residual AHI is 1.6. 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   Given that she had prolonged period of sleep hypoxemia and still has a headache while using CPAP it may be worthwhile for [place] to check her oxygen level. She describes her headaches as daily and is quite discouraged. The geriatric depression score today was  endorsed at 6 points at Epworth sleepiness score at 7 points fatigue severity score at 58 points. The patient takes vitamin B supplements occasionally vitamin D supplements. No iron or multimineral supplement.  she wakes with headaches, often woken by headaches as early 3.30 AM and nausea and photophobia are present, occipital and behind the right eye. This stabbing, sharp point with tearing of the right eye- CLUSTER? She has begun taking naps daily- this is new.   Sleep habits are as follows: 10 PM , goes to sleep within 5-10 minutes, with CPAP, she has 3-4 bathroom breaks, wakes every morning with headaches, at 6 AM, sleeps on 1 pillow, The bedroom is cool and quiet and dark. Her husband sleeps in another house, across the street. She just got married, the households are not combined yet.  Interval history from 03/06/2016, Rebecca Marshall is here today and reports that she suffers from more depression, pretty severe depression cycle. She reports daily headaches excessive fatigue she has been in the process of weaning off Topamax, and she has returned oxygen supplement as it does not help her cluster headaches. Her CPAP use is excellent daily user time is 7 hours and 24 minutes she is 93% compliant CPAP is set at 9 cm water was 1 cm EPR she has a low leak and a residual AHI of 3.9. Fatigue severity is endorsed at 59 points at Epworth sleepiness  scale at 7 points. I think this is mostly depression related.    Review of Systems: Out of a complete 14 system review, the patient complains of only the following symptoms, and all other reviewed systems are negative.; see above    Fatigue severity is endorsed at 59 points at Epworth sleepiness scale at 7 points. I think this is mostly depression related.     Social History   Social History  . Marital Status: Married    Spouse Name: [name]  . Number of Children: 3  . Years of Education: 12+   Occupational History  . unemployed     Social History  Main Topics  . Smoking status: Never Smoker   . Smokeless tobacco: Never Used  . Alcohol Use: No     Comment: none  . Drug Use: No  . Sexual Activity: Not on file   Other Topics Concern  . Not on file   Social History Narrative   Patient lives at home alone    Patient is married    Patient is not working    Patient has 3 children    Patient has her BSN RN   Patient is right handed       Daily caffeine use 1 cup daily.    Family History  Problem Relation Age of Onset  . Cancer Other   . Alcohol abuse Mother   . Cancer Mother 27    Lung   . Alcohol abuse Father   . Cancer Sister 63    Breast    Past Medical History  Diagnosis Date  . Atrial dysrhythmia   . Fibromyalgia   . Cancer [facility])     thyroid-status post resection on replacement Dr. Chalmers Cater  . Anxiety   . Depression   . Thyroid disease   . GE reflux   . Arthritis   . Osteoporosis   . Sleep apnea 10/2015    x 1 year, CPAP  . Headache     migraines    Past Surgical History  Procedure Laterality Date  . Cholecystectomy    . Thyroidectomy  04/2009    papillary carcimao Dr. Erik Obey  . Total hip arthroplasty  2009 and 2011    L hip   . Tubal ligation    . Abdominal hysterectomy    . Colonoscopy  04/2011  . Toe surgery      bone spur removed - rt great toe  . Cataract extraction Bilateral 09/05/15, 09/12/15  . Caroitid dopplers Bilateral 11/2015    normal    Current Outpatient Prescriptions  Medication Sig Dispense Refill  . calcium-vitamin D (OSCAL WITH D) 500-200 MG-UNIT per tablet Take 1 tablet by mouth every morning.    . diazepam (VALIUM) 10 MG tablet Take 0.5 tablets (5 mg total) by mouth every 12 (twelve) hours as needed for anxiety. 30 tablet 4  . eletriptan (RELPAX) 40 MG tablet Take by mouth one at the beginning of headache and may repeat once in 2  Hours for maximum of 2 tabs in 24hours 27 tablet 3  . flecainide (TAMBOCOR) 150 MG tablet Take 1 tablet (150 mg total) by mouth 2 (two) times  daily. 180 tablet 3  . FLUoxetine (PROZAC) 20 MG tablet Take 1 tablet (20 mg total) by mouth daily. 90 tablet 3  . HYDROmorphone (DILAUDID) 2 MG tablet Take 2 mg by mouth every 4 (four) hours as needed. Per pain clinic    . levothyroxine (SYNTHROID, LEVOTHROID) 150 MCG tablet  TAKE 1 TABLET DAILY 90 tablet 1  . ondansetron (ZOFRAN ODT) 4 MG disintegrating tablet 4mg  ODT q4 hours prn nausea/vomit 15 tablet 0  . OXYGEN Inhale into the lungs. 100% oxygen via a nonrebreather face mask at 7-12 liters/minute for no more than 10-15 minutes at a time for cluster headaches.    . pantoprazole (PROTONIX) 40 MG tablet TAKE 1 TABLET TWICE A DAY 180 tablet 2  . sodium chloride (OCEAN) 0.65 % SOLN nasal spray Place 1 spray into both nostrils as needed (For nasal dryness.).    Marland Kitchen topiramate (TOPAMAX) 25 MG tablet Take 4 tablets (100 mg total) by mouth at bedtime. 360 tablet 3  . traZODone (DESYREL) 100 MG tablet Take 2 tablets (200 mg total) by mouth at bedtime. 180 tablet 2  . triamcinolone cream (KENALOG) 0.1 % Apply 1 application topically 2 (two) times daily as needed (for ezcema).     No current facility-administered medications for this visit.    Allergies as of 03/06/2016 - Review Complete 01/09/2016  Allergen Reaction Noted  . Fentanyl Anaphylaxis 09/25/2012  . Nsaids Other (See Comments) 11/14/2014  . Codeine Nausea And Vomiting 09/25/2012    Vitals: There were no vitals taken for this visit. Last Weight:  Wt Readings from Last 1 Encounters:  01/09/16 228 lb (103.42 kg)   [id number] is no weight on file to calculate BMI.     Last Height:   Ht Readings from Last 1 Encounters:  12/12/15 5\' 8"  (1.727 m)    Physical exam:  General: The patient is awake, alert and appears not in acute distress. The patient is well groomed. Head: Normocephalic, atraumatic. Neck is supple. Mallampati 4,  neck circumference:15. Nasal airflow unrestricted ,  Cardiovascular:  Regular rate and rhythm, without   murmurs or carotid bruit, and without distended neck veins. Respiratory: Lungs are clear to auscultation. Skin:  Without evidence of edema, or rash Trunk: BMI is elevated . The patient's posture is erect  Neurologic exam : The patient is awake and alert, oriented to place and time.    Memory testing deferred [place]: Montreal Cognitive Assessment  10/12/2014  Visuospatial/ Executive (0/5) 5  Naming (0/3) 3  Attention: Read list of digits (0/2) 2  Attention: Read list of letters (0/1) 1  Attention: Serial 7 subtraction starting at 100 (0/3) 3  Language: Repeat phrase (0/2) 2  Language : Fluency (0/1) 1  Abstraction (0/2) 2  Delayed Recall (0/5) 5  Orientation (0/6) 6  Total 30   MMSE:No flowsheet data found.     Attention span & concentration ability appears normal.  Speech is fluent,  without  dysarthria, dysphonia or aphasia.  Mood and affect are appropriate.  Cranial nerves: Pupils are equal and briskly reactive to light. Funduscopic exam without evidence of pallor or edema. Extraocular movements  in vertical and horizontal planes intact and without nystagmus. Visual fields by finger perimetry are intact. Hearing to finger rub intact.   Facial sensation intact to fine touch.  Facial motor strength is symmetric and tongue and uvula move midline. Shoulder shrug was symmetrical.   Motor exam: Normal tone, muscle bulk and symmetric strength in all extremities.   The patient was advised of the nature of the diagnosed sleep disorder , the treatment options and risks for general a health and wellness arising from not treating the condition.  I spent more than 30 minutes of face to face time with the patient. Greater than 50% of time was spent in counseling and  coordination of care. We have discussed the diagnosis and differential and I answered the patient's questions.     Assessment:  After physical and neurologic examination, review of laboratory studies,  Personal review of  imaging studies, reports of other /same  Imaging studies ,  Results of polysomnography/ neurophysiology testing and pre-existing records as far as provided in visit., my assessment is   1) Mrs. Nocito suffers from daily headaches, she wakes up with headaches which are occipital and temporal located she also has another quality of headaches a sharp stabbing sensation to the right eye and this degree of headaches can wake her from sleep. She feels that these are mostly migrainous in nature also is a fulfill cluster headache propriety. Since  hypoxemia or hypercapnia  are not playing a role in her headaches , I will defer to Dr. Jaynee Eagles.   Her overall apnea index has been so well controlled on CPAP with a encouraged her to continue using it.  Plan:  Treatment plan and additional workup :  Yearly follow up for CPAP. She has used a lidocaine nasal spray which worked but is price prohibitive for her. Topiramate causes memory disturbance and cognitive slowing, but has also not helped her headaches. The oxygen at night for cluster headaches has not helped either. She wakes up at 5.30 , early and non refreshed.   Refer to [facility] for depression treatment.   Asencion Partridge Jessicalynn Deshong MD  03/06/2016  CC; Sarina Ill. MD CC: Dickie La, Md [id number] [place], [place] [address]

## 2016-03-07 DIAGNOSIS — F331 Major depressive disorder, recurrent, moderate: Secondary | ICD-10-CM | POA: Diagnosis not present

## 2016-03-07 DIAGNOSIS — Z6981 Encounter for mental health services for victim of other abuse: Secondary | ICD-10-CM | POA: Diagnosis not present

## 2016-03-07 DIAGNOSIS — Z62811 Personal history of psychological abuse in childhood: Secondary | ICD-10-CM | POA: Diagnosis not present

## 2016-03-14 ENCOUNTER — Other Ambulatory Visit: Payer: Self-pay | Admitting: *Deleted

## 2016-03-14 MED ORDER — LEVOTHYROXINE SODIUM 150 MCG PO TABS: 150.0000 ug | ORAL_TABLET | Freq: Every day | ORAL | Status: DC

## 2016-04-02 ENCOUNTER — Other Ambulatory Visit: Payer: Self-pay | Admitting: Internal Medicine

## 2016-04-03 ENCOUNTER — Other Ambulatory Visit: Payer: Self-pay

## 2016-04-03 MED ORDER — FLECAINIDE ACETATE 150 MG PO TABS: 150.0000 mg | ORAL_TABLET | Freq: Two times a day (BID) | ORAL | Status: DC

## 2016-04-08 DIAGNOSIS — Z6981 Encounter for mental health services for victim of other abuse: Secondary | ICD-10-CM | POA: Diagnosis not present

## 2016-04-08 DIAGNOSIS — F331 Major depressive disorder, recurrent, moderate: Secondary | ICD-10-CM | POA: Diagnosis not present

## 2016-04-08 DIAGNOSIS — Z62811 Personal history of psychological abuse in childhood: Secondary | ICD-10-CM | POA: Diagnosis not present

## 2016-04-11 MED FILL — HYDROmorphone HCL 2 MG TABS: 2 | 30 days supply | Qty: 180 | Fill #0

## 2016-04-12 DIAGNOSIS — G4733 Obstructive sleep apnea (adult) (pediatric): Secondary | ICD-10-CM | POA: Diagnosis not present

## 2016-04-15 DIAGNOSIS — G894 Chronic pain syndrome: Secondary | ICD-10-CM | POA: Diagnosis not present

## 2016-04-15 DIAGNOSIS — M47816 Spondylosis without myelopathy or radiculopathy, lumbar region: Secondary | ICD-10-CM | POA: Diagnosis not present

## 2016-04-15 DIAGNOSIS — M150 Primary generalized (osteo)arthritis: Secondary | ICD-10-CM | POA: Diagnosis not present

## 2016-04-15 DIAGNOSIS — Z79891 Long term (current) use of opiate analgesic: Secondary | ICD-10-CM | POA: Diagnosis not present

## 2016-04-22 DIAGNOSIS — F331 Major depressive disorder, recurrent, moderate: Secondary | ICD-10-CM | POA: Diagnosis not present

## 2016-04-22 DIAGNOSIS — Z62811 Personal history of psychological abuse in childhood: Secondary | ICD-10-CM | POA: Diagnosis not present

## 2016-04-22 DIAGNOSIS — Z6981 Encounter for mental health services for victim of other abuse: Secondary | ICD-10-CM | POA: Diagnosis not present

## 2016-06-04 DIAGNOSIS — E6609 Other obesity due to excess calories: Secondary | ICD-10-CM | POA: Diagnosis not present

## 2016-06-04 DIAGNOSIS — F5101 Primary insomnia: Secondary | ICD-10-CM | POA: Diagnosis not present

## 2016-06-04 DIAGNOSIS — Z79899 Other long term (current) drug therapy: Secondary | ICD-10-CM | POA: Diagnosis not present

## 2016-06-04 DIAGNOSIS — Z85850 Personal history of malignant neoplasm of thyroid: Secondary | ICD-10-CM | POA: Diagnosis not present

## 2016-06-04 DIAGNOSIS — E7800 Pure hypercholesterolemia, unspecified: Secondary | ICD-10-CM | POA: Diagnosis not present

## 2016-06-04 DIAGNOSIS — E890 Postprocedural hypothyroidism: Secondary | ICD-10-CM | POA: Diagnosis not present

## 2016-06-04 DIAGNOSIS — Z1159 Encounter for screening for other viral diseases: Secondary | ICD-10-CM | POA: Diagnosis not present

## 2016-06-04 DIAGNOSIS — K219 Gastro-esophageal reflux disease without esophagitis: Secondary | ICD-10-CM | POA: Diagnosis not present

## 2016-06-11 DIAGNOSIS — Z6981 Encounter for mental health services for victim of other abuse: Secondary | ICD-10-CM | POA: Diagnosis not present

## 2016-06-11 DIAGNOSIS — Z62811 Personal history of psychological abuse in childhood: Secondary | ICD-10-CM | POA: Diagnosis not present

## 2016-06-11 DIAGNOSIS — F331 Major depressive disorder, recurrent, moderate: Secondary | ICD-10-CM | POA: Diagnosis not present

## 2016-06-11 MED FILL — HYDROmorphone HCL 2 MG TABS: 2 | 30 days supply | Qty: 180 | Fill #0

## 2016-06-28 DIAGNOSIS — G43009 Migraine without aura, not intractable, without status migrainosus: Secondary | ICD-10-CM | POA: Diagnosis not present

## 2016-07-08 DIAGNOSIS — M25551 Pain in right hip: Secondary | ICD-10-CM | POA: Diagnosis not present

## 2016-07-19 DIAGNOSIS — R197 Diarrhea, unspecified: Secondary | ICD-10-CM | POA: Diagnosis not present

## 2016-07-19 DIAGNOSIS — M5416 Radiculopathy, lumbar region: Secondary | ICD-10-CM | POA: Diagnosis not present

## 2016-07-19 MED FILL — HYDROmorphone HCL 2 MG TABS: 2 | 30 days supply | Qty: 180 | Fill #0

## 2016-08-06 DIAGNOSIS — M4726 Other spondylosis with radiculopathy, lumbar region: Secondary | ICD-10-CM | POA: Diagnosis not present

## 2016-08-06 DIAGNOSIS — M549 Dorsalgia, unspecified: Secondary | ICD-10-CM | POA: Diagnosis not present

## 2016-08-06 DIAGNOSIS — M546 Pain in thoracic spine: Secondary | ICD-10-CM | POA: Diagnosis not present

## 2016-08-06 DIAGNOSIS — M5136 Other intervertebral disc degeneration, lumbar region: Secondary | ICD-10-CM | POA: Diagnosis not present

## 2016-08-06 DIAGNOSIS — M5416 Radiculopathy, lumbar region: Secondary | ICD-10-CM | POA: Diagnosis not present

## 2016-08-09 DIAGNOSIS — M4726 Other spondylosis with radiculopathy, lumbar region: Secondary | ICD-10-CM | POA: Diagnosis not present

## 2016-08-09 DIAGNOSIS — M5126 Other intervertebral disc displacement, lumbar region: Secondary | ICD-10-CM | POA: Diagnosis not present

## 2016-08-12 DIAGNOSIS — M47816 Spondylosis without myelopathy or radiculopathy, lumbar region: Secondary | ICD-10-CM | POA: Diagnosis not present

## 2016-08-12 DIAGNOSIS — Z79891 Long term (current) use of opiate analgesic: Secondary | ICD-10-CM | POA: Diagnosis not present

## 2016-08-12 DIAGNOSIS — M150 Primary generalized (osteo)arthritis: Secondary | ICD-10-CM | POA: Diagnosis not present

## 2016-08-12 DIAGNOSIS — G894 Chronic pain syndrome: Secondary | ICD-10-CM | POA: Diagnosis not present

## 2016-08-12 MED FILL — CYCLOBENZAPRINE 10 MG TAB: 10 | 30 days supply | Qty: 30 | Fill #0

## 2016-09-04 DIAGNOSIS — M5136 Other intervertebral disc degeneration, lumbar region: Secondary | ICD-10-CM | POA: Diagnosis not present

## 2016-09-04 DIAGNOSIS — M5416 Radiculopathy, lumbar region: Secondary | ICD-10-CM | POA: Diagnosis not present

## 2016-09-04 DIAGNOSIS — M4726 Other spondylosis with radiculopathy, lumbar region: Secondary | ICD-10-CM | POA: Diagnosis not present

## 2016-09-04 DIAGNOSIS — Z6836 Body mass index (BMI) 36.0-36.9, adult: Secondary | ICD-10-CM | POA: Diagnosis not present

## 2016-09-10 ENCOUNTER — Ambulatory Visit: Payer: BLUE CROSS/BLUE SHIELD | Admitting: Internal Medicine

## 2016-09-12 DIAGNOSIS — M25551 Pain in right hip: Secondary | ICD-10-CM | POA: Diagnosis not present

## 2016-09-13 MED FILL — HYDROmorphone HCL 2 MG TABS: 2 | 30 days supply | Qty: 180 | Fill #0

## 2016-10-16 DIAGNOSIS — G4733 Obstructive sleep apnea (adult) (pediatric): Secondary | ICD-10-CM | POA: Diagnosis not present

## 2016-10-31 DIAGNOSIS — J209 Acute bronchitis, unspecified: Secondary | ICD-10-CM | POA: Diagnosis not present

## 2016-10-31 MED FILL — predniSONE 10 MG (21) TBPK: 10 | 6 days supply | Qty: 21 | Fill #0

## 2016-11-22 DIAGNOSIS — F331 Major depressive disorder, recurrent, moderate: Secondary | ICD-10-CM | POA: Diagnosis not present

## 2016-11-22 DIAGNOSIS — R05 Cough: Secondary | ICD-10-CM | POA: Diagnosis not present

## 2016-11-22 DIAGNOSIS — I499 Cardiac arrhythmia, unspecified: Secondary | ICD-10-CM | POA: Diagnosis not present

## 2016-12-09 DIAGNOSIS — H353131 Nonexudative age-related macular degeneration, bilateral, early dry stage: Secondary | ICD-10-CM | POA: Diagnosis not present

## 2016-12-09 DIAGNOSIS — Z961 Presence of intraocular lens: Secondary | ICD-10-CM | POA: Diagnosis not present

## 2016-12-09 DIAGNOSIS — H16223 Keratoconjunctivitis sicca, not specified as Sjogren's, bilateral: Secondary | ICD-10-CM | POA: Diagnosis not present

## 2016-12-09 MED FILL — RESTASIS 0.05% EYE EMULSION: 0.05 | 15 days supply | Qty: 60 | Fill #0

## 2016-12-11 DIAGNOSIS — F4320 Adjustment disorder, unspecified: Secondary | ICD-10-CM | POA: Diagnosis not present

## 2017-01-14 DIAGNOSIS — J384 Edema of larynx: Secondary | ICD-10-CM | POA: Diagnosis not present

## 2017-01-14 DIAGNOSIS — K219 Gastro-esophageal reflux disease without esophagitis: Secondary | ICD-10-CM | POA: Diagnosis not present

## 2017-01-14 DIAGNOSIS — J383 Other diseases of vocal cords: Secondary | ICD-10-CM | POA: Diagnosis not present

## 2017-01-15 DIAGNOSIS — C73 Malignant neoplasm of thyroid gland: Secondary | ICD-10-CM | POA: Diagnosis not present

## 2017-01-21 DIAGNOSIS — E890 Postprocedural hypothyroidism: Secondary | ICD-10-CM | POA: Diagnosis not present

## 2017-01-21 DIAGNOSIS — M899 Disorder of bone, unspecified: Secondary | ICD-10-CM | POA: Diagnosis not present

## 2017-01-21 DIAGNOSIS — C73 Malignant neoplasm of thyroid gland: Secondary | ICD-10-CM | POA: Diagnosis not present

## 2017-01-22 DIAGNOSIS — G4733 Obstructive sleep apnea (adult) (pediatric): Secondary | ICD-10-CM | POA: Diagnosis not present

## 2017-02-05 DIAGNOSIS — R05 Cough: Secondary | ICD-10-CM | POA: Diagnosis not present

## 2017-02-05 DIAGNOSIS — R490 Dysphonia: Secondary | ICD-10-CM | POA: Diagnosis not present

## 2017-02-05 DIAGNOSIS — K219 Gastro-esophageal reflux disease without esophagitis: Secondary | ICD-10-CM | POA: Diagnosis not present

## 2017-03-10 ENCOUNTER — Ambulatory Visit: Payer: BLUE CROSS/BLUE SHIELD | Admitting: Neurology

## 2017-03-13 ENCOUNTER — Other Ambulatory Visit: Payer: Self-pay | Admitting: Gastroenterology

## 2017-03-13 DIAGNOSIS — K219 Gastro-esophageal reflux disease without esophagitis: Secondary | ICD-10-CM | POA: Diagnosis not present

## 2017-03-13 DIAGNOSIS — K591 Functional diarrhea: Secondary | ICD-10-CM | POA: Diagnosis not present

## 2017-03-17 ENCOUNTER — Ambulatory Visit
Admission: RE | Admit: 2017-03-17 | Discharge: 2017-03-17 | Disposition: A | Discharge status: Home / Self Care | Payer: BLUE CROSS/BLUE SHIELD | Source: Ambulatory Visit | Attending: Gastroenterology | Admitting: Gastroenterology

## 2017-03-17 DIAGNOSIS — K219 Gastro-esophageal reflux disease without esophagitis: Secondary | ICD-10-CM | POA: Diagnosis not present

## 2017-03-24 ENCOUNTER — Ambulatory Visit (INDEPENDENT_AMBULATORY_CARE_PROVIDER_SITE_OTHER): Payer: BLUE CROSS/BLUE SHIELD | Admitting: Neurology

## 2017-03-24 ENCOUNTER — Encounter: Payer: Self-pay | Admitting: Neurology

## 2017-03-24 VITALS — BP 120/70 | HR 89 | Ht 68.0 in | Wt 227.0 lb

## 2017-03-24 DIAGNOSIS — R0681 Apnea, not elsewhere classified: Secondary | ICD-10-CM | POA: Diagnosis not present

## 2017-03-24 DIAGNOSIS — Z91199 Patient's noncompliance with other medical treatment and regimen due to unspecified reason: Secondary | ICD-10-CM

## 2017-03-24 DIAGNOSIS — Z9989 Dependence on other enabling machines and devices: Secondary | ICD-10-CM | POA: Diagnosis not present

## 2017-03-24 DIAGNOSIS — G4733 Obstructive sleep apnea (adult) (pediatric): Secondary | ICD-10-CM | POA: Diagnosis not present

## 2017-03-24 DIAGNOSIS — K219 Gastro-esophageal reflux disease without esophagitis: Secondary | ICD-10-CM | POA: Diagnosis not present

## 2017-03-24 DIAGNOSIS — Z9114 Patient's other noncompliance with medication regimen: Secondary | ICD-10-CM | POA: Diagnosis not present

## 2017-03-24 HISTORY — DX: Patient's other noncompliance with medication regimen: Z91.14

## 2017-03-24 HISTORY — DX: Patient's noncompliance with other medical treatment and regimen due to unspecified reason: Z91.199

## 2017-03-24 NOTE — Progress Notes (Signed)
SLEEP MEDICINE CLINIC   Provider:  Larey Seat, M D  Referring Provider:  Sarina Ill, MD  Primary Care Physician:  Kathyrn Lass, MD  Chief Complaint  Patient presents with  . Follow-up    cpap working well, good with supplies    HPI:  Kinlie A. Kelso is a 66 y.o. female , seen here as a patient of Dr. Ferdinand Lango and has seen Dr Jannifer Franklin, with headaches.  She has undergone a sleep evaluation in 2015-16 and wonders if her CPAP contributes to the problem.  Mrs. Schamberger underwent a sleep study evaluation on 10/23/2014, at the time endorsing the Epworth sleepiness score at 14 points and the depression inventory at 7 points. She was diagnosed with an AHI of 20.2 the lowest oxygen saturation was 83% with 190 minutes of desaturation time she did not produce any tachycardia or bradycardia arrhythmia. She was titrated to 8 cm water was 1 cm flex function and was placed on a pico [employer] mask medium size the AHI at this pressure was 0.0 and she slept 56 minutes with a machine at this pressure. She had previously seen Dr. Jaynee Eagles for memory loss and a sleep evaluation was also necessitated by her frequent headaches. Dr. Jaynee Eagles ordered an MRI of the brain, a neuropsychological evaluation, initiated a dementia workup and had ordered a sleep study she also order an EEG which was interpreted by Dr. Jannifer Franklin. In the meantime she has 100% compliance for days of CPAP use and 80% compliance for over 4 hours of nightly use. Average user time is 6 hours. The machine is currently set at 9 cm water pressure was 1 cm EPR she has minor air leaks and an excellent resolution of apnea, the residual AHI is 1.6. 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  Given that she had prolonged period of sleep hypoxemia and still has a headache while using CPAP it may be worthwhile for [place] to check her oxygen level. She describes her headaches as daily and  is quite discouraged. The geriatric depression score today was endorsed at 6 points at Epworth sleepiness score at 7 points fatigue severity score at 58 points. The patient takes vitamin B supplements occasionally vitamin D supplements. No iron or multimineral supplement.  she wakes with headaches, often woken by headaches as early 3.30 AM and nausea and photophobia are present, occipital and behind the right eye. This stabbing, sharp point with tearing of the right eye- CLUSTER? She has begun taking naps daily- this is new.   Sleep habits are as follows: 10 PM , goes to sleep within 5-10 minutes, with CPAP, she has 3-4 bathroom breaks, wakes every morning with headaches, at 6 AM, sleeps on 1 pillow, The bedroom is cool and quiet and dark. Her husband sleeps in another house, across the street. She just got married, the households are not combined yet.  Interval history from 03/06/2016, Mrs. Nela Bascom is here today and reports that she suffers from more depression, pretty severe depression cycle. She reports daily headaches excessive fatigue she has been in the process of weaning off Topamax, and she has returned oxygen supplement as it does not help her cluster headaches. Her CPAP use is excellent daily user time is 7 hours and 24 minutes she is 93% compliant CPAP is set at 9 cm water was 1 cm EPR she has a low leak and a residual AHI  of 3.9. Fatigue severity is endorsed at 59 points at Epworth sleepiness scale at 7 points. I think this is mostly depression related.  History from 03/24/2017. Mrs. Ferryman has moved to a new residence and in the space has developed more allergic rhinitis upper respiratory infection. She reports on the other hand that her headaches have much improved since identified as cluster headaches, she is continuously using CPAP and topiramate 50 mg daily, but her CPAP compliance was a little decreased this year at 53%. I encouraged her to take Benadryl or another antihistamine to allow  her to sleep in her bedroom until its renovated she should be able to use the machine for 6 hours each night. She has severe GERD, has been on PPI for 25 years now.  Epworth sleepiness score was endorsed at 9 points, fatigue severity at 42 points and the geriatric depression score at 0 out of 15 points. Please note that the patient is a legal guardian for her nephew.    Review of Systems: Out of a complete 14 system review, the patient complains of only the following symptoms, and all other reviewed systems are negative.; see above    Fatigue severity is endorsed at 59 points at Epworth sleepiness scale at  9 from 7 points. I think this is mostly related to allergic reactions, nasal congestion and GERD. Encourage continuous CPAP use.      Social History   Social History  . Marital status: Married    Spouse name: [name]  . Number of children: 3  . Years of education: 12+   Occupational History  . unemployed     Social History Main Topics  . Smoking status: Never Smoker  . Smokeless tobacco: Never Used  . Alcohol use No     Comment: none  . Drug use: No  . Sexual activity: Not on file   Other Topics Concern  . Not on file   Social History Narrative   Patient lives at home alone    Patient is married    Patient is not working    Patient has 3 children    Patient has her BSN RN   Patient is right handed       Daily caffeine use 1 cup daily.    Family History  Problem Relation Age of Onset  . Cancer Other   . Alcohol abuse Mother   . Cancer Mother 79       Lung   . Alcohol abuse Father   . Cancer Sister 40       Breast    Past Medical History:  Diagnosis Date  . Anxiety   . Arthritis   . Atrial dysrhythmia   . Cancer [facility])    thyroid-status post resection on replacement Dr. Chalmers Cater  . Depression   . Fibromyalgia   . GE reflux   . Headache    migraines  . Osteoporosis   . Sleep apnea 10/2015   x 1 year, CPAP  . Thyroid disease     Past Surgical History:    Procedure Laterality Date  . ABDOMINAL HYSTERECTOMY    . caroitid dopplers Bilateral 11/2015   normal  . CATARACT EXTRACTION Bilateral 09/05/15, 09/12/15  . CHOLECYSTECTOMY    . COLONOSCOPY  04/2011  . THYROIDECTOMY  04/2009   papillary carcimao Dr. Erik Obey  . TOE SURGERY     bone spur removed - rt great toe  . TOTAL HIP ARTHROPLASTY  2009 and 2011   L hip   .  TUBAL LIGATION      Current Outpatient Prescriptions  Medication Sig Dispense Refill  . eletriptan (RELPAX) 40 MG tablet Take by mouth one at the beginning of headache and may repeat once in 2  Hours for maximum of 2 tabs in 24hours 27 tablet 3  . flecainide (TAMBOCOR) 150 MG tablet Take 1 tablet (150 mg total) by mouth 2 (two) times daily. 180 tablet 2  . FLUoxetine (PROZAC) 20 MG tablet Take 1 tablet (20 mg total) by mouth daily. 90 tablet 3  . levothyroxine (SYNTHROID, LEVOTHROID) 150 MCG tablet Take 1 tablet (150 mcg total) by mouth daily. 90 tablet 1  . ondansetron (ZOFRAN ODT) 4 MG disintegrating tablet 4mg  ODT q4 hours prn nausea/vomit 15 tablet 0  . pantoprazole (PROTONIX) 40 MG tablet TAKE 1 TABLET TWICE A DAY 180 tablet 2  . ranitidine (ZANTAC) 150 MG tablet Take 300 mg by mouth at bedtime.    . sodium chloride (OCEAN) 0.65 % SOLN nasal spray Place 1 spray into both nostrils as needed (For nasal dryness.).    Marland Kitchen traZODone (DESYREL) 100 MG tablet Take 2 tablets (200 mg total) by mouth at bedtime. 180 tablet 2  . triamcinolone cream (KENALOG) 0.1 % Apply 1 application topically 2 (two) times daily as needed (for ezcema).     No current facility-administered medications for this visit.     Allergies as of 03/24/2017 - Review Complete 03/06/2016  Allergen Reaction Noted  . Fentanyl Anaphylaxis 09/25/2012  . Nsaids Other (See Comments) 11/14/2014  . Codeine Nausea And Vomiting 09/25/2012    Vitals: BP 120/70   Pulse 89   Ht 5\' 8"  (1.727 m)   Wt 227 lb (103 kg)   BMI 34.52 kg/m  Last Weight:  Wt Readings from  Last 1 Encounters:  03/24/17 227 lb (103 kg)   XAJ:OINO mass index is 34.52 kg/m.     Last Height:   Ht Readings from Last 1 Encounters:  03/24/17 5\' 8"  (1.727 m)    Physical exam:  General: The patient is awake, alert and appears not in acute distress. The patient is well groomed. Head: Normocephalic, atraumatic. Neck is supple. Mallampati 4,  neck circumference:15. Nasal airflow unrestricted ,  Cardiovascular:  Regular rate and rhythm, without  murmurs or carotid bruit, and without distended neck veins. Respiratory: Lungs are clear to auscultation. Skin:  Without evidence of edema, or rash Trunk: BMI is elevated . The patient's posture is erect  Neurologic exam : The patient is awake and alert, oriented to place and time.    Memory testing deferred in 03-24-2017  MOCA: Pleak Cognitive Assessment  10/12/2014  Visuospatial/ Executive (0/5) 5  Naming (0/3) 3  Attention: Read list of digits (0/2) 2  Attention: Read list of letters (0/1) 1  Attention: Serial 7 subtraction starting at 100 (0/3) 3  Language: Repeat phrase (0/2) 2  Language : Fluency (0/1) 1  Abstraction (0/2) 2  Delayed Recall (0/5) 5  Orientation (0/6) 6  Total 30   MMSE:No flowsheet data found. Attention span & concentration ability appears normal.  Speech is fluent,  without  dysarthria, dysphonia or aphasia.  Mood and affect are appropriate.  Cranial nerves: Pupils are equal and briskly reactive to light. Extraocular movements  in vertical and horizontal planes intact and without nystagmus. Visual fields by finger perimetry are intact. Hearing to finger rub intact.  Facial motor strength is symmetric and tongue and uvula move midline. Shoulder shrug was symmetrical.  Motor exam: Normal  tone, muscle bulk and symmetric strength in all extremities.   The patient was advised of the nature of the diagnosed sleep disorder , the treatment options and risks for general a health and wellness arising from not  treating the condition.  I spent more than 20 minutes of face to face time with the patient. Greater than 50% of time was spent in counseling and coordination of care. We have discussed the diagnosis and differential and I answered the patient's questions.     Assessment:  After physical and neurologic examination, review of laboratory studies,  Personal review of imaging studies, reports of other /same  Imaging studies ,  Results of polysomnography/ neurophysiology testing and pre-existing records as far as provided in visit., my assessment is    1) OSA- declining compliance with CPAP. Her machine is 65 years old. Mrs. Harries has just moved into a new residence which has been wall-to-wall carpeted, she is stepwise renovating the place and she noticed that her carpeted bedroom has given her severe respiratory allergies. This has affected her ability to use CPAP compliantly. She is planning to remove the carpet and she has started to take Benadryl at night. I looked daily compliance with CPAP was 93% she was on 12 days of the 30 days not able to use the machine for longer than 4 hours. Her compliance has been much better last year. I hope that she is on trend to re-increase CPAP use the machine is still set at 9 cm water pressure with 2.7 residual AHI and very little air leak. I would encourage her to use antihistamines until the allergens can be removed or reduced. CPAP should help with allergic responses, as it is filtered air. Compliance for the last 30 days was only 53% because of the short user time.  2) Mrs. Moores headaches has improved, not longer suffering the same cluster headaches, she wakes no longer up from or  with headaches which are occipital and temporal located she also has another quality of headaches a sharp stabbing sensation to the right eye and this degree of headaches can wake her from sleep. Topiramate at 50 mg once a day. Since  hypoxemia or hypercapnia  are not playing a role in her  headaches , I will defer to Dr. Jaynee Eagles.   Her overall apnea index has been so well controlled on CPAP with a encouraged her to continue using it.  Plan:  Treatment plan and additional workup :  Yearly follow up for CPAP.  defer to [place] counseling for depression treatment.   Asencion Partridge Annalyse Langlais MD  03/24/2017  CC; Sarina Ill. MD CC: Dickie La, Md [address] [place], [place] [address]

## 2017-03-25 ENCOUNTER — Telehealth: Payer: Self-pay | Admitting: Neurology

## 2017-03-25 DIAGNOSIS — Z9989 Dependence on other enabling machines and devices: Principal | ICD-10-CM

## 2017-03-25 DIAGNOSIS — G4733 Obstructive sleep apnea (adult) (pediatric): Secondary | ICD-10-CM

## 2017-03-25 NOTE — Telephone Encounter (Signed)
Ordered for cpap was fixed

## 2017-03-31 ENCOUNTER — Encounter (HOSPITAL_COMMUNITY): Payer: Self-pay

## 2017-03-31 ENCOUNTER — Ambulatory Visit (HOSPITAL_COMMUNITY)
Admission: RE | Admit: 2017-03-31 | Discharge: 2017-03-31 | Disposition: A | Discharge status: Home / Self Care | Payer: BLUE CROSS/BLUE SHIELD | Source: Ambulatory Visit | Attending: Gastroenterology | Admitting: Gastroenterology

## 2017-03-31 ENCOUNTER — Encounter (HOSPITAL_COMMUNITY)
Admission: RE | Disposition: A | Discharge status: Home / Self Care | Payer: Self-pay | Source: Ambulatory Visit | Attending: Gastroenterology

## 2017-03-31 DIAGNOSIS — K449 Diaphragmatic hernia without obstruction or gangrene: Secondary | ICD-10-CM | POA: Diagnosis not present

## 2017-03-31 DIAGNOSIS — K224 Dyskinesia of esophagus: Secondary | ICD-10-CM | POA: Insufficient documentation

## 2017-03-31 DIAGNOSIS — K219 Gastro-esophageal reflux disease without esophagitis: Secondary | ICD-10-CM | POA: Insufficient documentation

## 2017-03-31 HISTORY — PX: ESOPHAGEAL MANOMETRY: SHX5429

## 2017-03-31 SURGERY — MANOMETRY, ESOPHAGUS

## 2017-03-31 MED ORDER — LIDOCAINE VISCOUS 2 % MT SOLN
OROMUCOSAL | Status: AC
  Filled 2017-03-31: qty 15

## 2017-03-31 SURGICAL SUPPLY — 2 items
FACESHIELD LNG OPTICON STERILE (SAFETY) IMPLANT
GLOVE BIO SURGEON STRL SZ8 (GLOVE) ×4 IMPLANT

## 2017-03-31 NOTE — Progress Notes (Signed)
Esophageal manometry done per protocol.  Pt. Tolerated well, without complications.  Dr. Michail Sermon to be made aware of study today.  Laverta Baltimore, RN

## 2017-04-02 ENCOUNTER — Encounter (HOSPITAL_COMMUNITY): Payer: Self-pay | Admitting: Gastroenterology

## 2017-04-03 DIAGNOSIS — K219 Gastro-esophageal reflux disease without esophagitis: Secondary | ICD-10-CM | POA: Diagnosis not present

## 2017-04-08 DIAGNOSIS — K449 Diaphragmatic hernia without obstruction or gangrene: Secondary | ICD-10-CM | POA: Diagnosis not present

## 2017-04-08 DIAGNOSIS — K317 Polyp of stomach and duodenum: Secondary | ICD-10-CM | POA: Diagnosis not present

## 2017-04-08 DIAGNOSIS — K219 Gastro-esophageal reflux disease without esophagitis: Secondary | ICD-10-CM | POA: Diagnosis not present

## 2017-04-14 DIAGNOSIS — K317 Polyp of stomach and duodenum: Secondary | ICD-10-CM | POA: Diagnosis not present

## 2017-04-21 DIAGNOSIS — G43009 Migraine without aura, not intractable, without status migrainosus: Secondary | ICD-10-CM | POA: Diagnosis not present

## 2017-04-21 DIAGNOSIS — F5101 Primary insomnia: Secondary | ICD-10-CM | POA: Diagnosis not present

## 2017-04-21 DIAGNOSIS — F331 Major depressive disorder, recurrent, moderate: Secondary | ICD-10-CM | POA: Diagnosis not present

## 2017-04-21 DIAGNOSIS — G4733 Obstructive sleep apnea (adult) (pediatric): Secondary | ICD-10-CM | POA: Diagnosis not present

## 2017-04-21 DIAGNOSIS — Z0000 Encounter for general adult medical examination without abnormal findings: Secondary | ICD-10-CM | POA: Diagnosis not present

## 2017-04-21 DIAGNOSIS — K219 Gastro-esophageal reflux disease without esophagitis: Secondary | ICD-10-CM | POA: Diagnosis not present

## 2017-04-21 DIAGNOSIS — E890 Postprocedural hypothyroidism: Secondary | ICD-10-CM | POA: Diagnosis not present

## 2017-04-21 DIAGNOSIS — Z6835 Body mass index (BMI) 35.0-35.9, adult: Secondary | ICD-10-CM | POA: Diagnosis not present

## 2017-04-21 DIAGNOSIS — I499 Cardiac arrhythmia, unspecified: Secondary | ICD-10-CM | POA: Diagnosis not present

## 2017-04-21 DIAGNOSIS — E6609 Other obesity due to excess calories: Secondary | ICD-10-CM | POA: Diagnosis not present

## 2017-04-21 MED FILL — metFORMIN HCL 500 MG TABS: 500 | 30 days supply | Qty: 60 | Fill #0

## 2017-05-14 DIAGNOSIS — K449 Diaphragmatic hernia without obstruction or gangrene: Secondary | ICD-10-CM | POA: Diagnosis not present

## 2017-05-14 DIAGNOSIS — K210 Gastro-esophageal reflux disease with esophagitis: Secondary | ICD-10-CM | POA: Diagnosis not present

## 2017-06-16 ENCOUNTER — Ambulatory Visit: Payer: Self-pay | Admitting: Surgery

## 2017-06-16 NOTE — Patient Instructions (Addendum)
Rebecca Marshall  06/16/2017   Your procedure is scheduled on: 06-26-17  Report to [facility] Main  Entrance Take [place]  elevators to 3rd floor to  [place] at 530AM.    Call this number if you have problems the morning of surgery [phone]    Remember: ONLY 1 PERSON MAY GO WITH YOU TO SHORT STAY TO GET  READY MORNING OF [place].  Do not eat food or drink liquids :After Midnight.     PLEASE BRING CPAP MASK AND TUBING ONLY. DEVICE WILL BE PROVIDED !   Take these medicines the morning of surgery with A SIP OF WATER: zyrtec as needed, flecainide(tambocor), fluoxetine(prozac), levothyroxien(synthroid), pantoprazole(protonix), nasal spray as needed                                You may not have any metal on your body including hair pins and              piercings  Do not wear jewelry, make-up, lotions, powders or perfumes, deodorant             Do not wear nail polish.  Do not shave  48 hours prior to surgery.             Do not bring valuables to the hospital. [facility].  Contacts, dentures or bridgework may not be worn into surgery.  Leave suitcase in the car. After surgery it may be brought to your room.                Please read over the following fact sheets you were given: _____________________________________________________________________            [facility] - Preparing for Surgery Before surgery, you can play an important role.  Because skin is not sterile, your skin needs to be as free of germs as possible.  You can reduce the number of germs on your skin by washing with CHG (chlorahexidine gluconate) soap before surgery.  CHG is an antiseptic cleaner which kills germs and bonds with the skin to continue killing germs even after washing. Please DO NOT use if you have an allergy to CHG or antibacterial soaps.  If your skin becomes reddened/irritated stop using the CHG and inform your  nurse when you arrive at Short Stay. Do not shave (including legs and underarms) for at least 48 hours prior to the first CHG shower.  You may shave your face/neck. Please follow these instructions carefully:  1.  Shower with CHG Soap the night before surgery and the  morning of Surgery.  2.  If you choose to wash your hair, wash your hair first as usual with your  normal  shampoo.  3.  After you shampoo, rinse your hair and body thoroughly to remove the  shampoo.                           4.  Use CHG as you would any other liquid soap.  You can apply chg directly  to the skin and wash  Gently with a scrungie or clean washcloth.  5.  Apply the CHG Soap to your body ONLY FROM THE NECK DOWN.   Do not use on face/ open                           Wound or open sores. Avoid contact with eyes, ears mouth and genitals (private parts).                       Wash face,  Genitals (private parts) with your normal soap.             6.  Wash thoroughly, paying special attention to the area where your surgery  will be performed.  7.  Thoroughly rinse your body with warm water from the neck down.  8.  DO NOT shower/wash with your normal soap after using and rinsing off  the CHG Soap.                9.  Pat yourself dry with a clean towel.            10.  Wear clean pajamas.            11.  Place clean sheets on your bed the night of your first shower and do not  sleep with pets. Day of Surgery : Do not apply any lotions/deodorants the morning of surgery.  Please wear clean clothes to the hospital/surgery center.  FAILURE TO FOLLOW THESE INSTRUCTIONS MAY RESULT IN THE CANCELLATION OF YOUR SURGERY PATIENT SIGNATURE_________________________________  NURSE SIGNATURE__________________________________  ________________________________________________________________________

## 2017-06-16 NOTE — Progress Notes (Signed)
PAT appt is 06-17-17. Please place orders in epic. thanks!

## 2017-06-16 NOTE — Progress Notes (Signed)
[place] cardiology Dr Caryl Comes 12-11-16 epic   Stress test 12-13-15 epic  ECHO 12-06-15 epic

## 2017-06-17 ENCOUNTER — Encounter (HOSPITAL_COMMUNITY)
Admission: RE | Admit: 2017-06-17 | Discharge: 2017-06-17 | Disposition: A | Discharge status: Home / Self Care | Payer: BLUE CROSS/BLUE SHIELD | Source: Ambulatory Visit | Attending: Surgery | Admitting: Surgery

## 2017-06-17 ENCOUNTER — Encounter (HOSPITAL_COMMUNITY): Payer: Self-pay | Admitting: Emergency Medicine

## 2017-06-17 DIAGNOSIS — R002 Palpitations: Secondary | ICD-10-CM | POA: Insufficient documentation

## 2017-06-17 DIAGNOSIS — R9431 Abnormal electrocardiogram [ECG] [EKG]: Secondary | ICD-10-CM | POA: Diagnosis not present

## 2017-06-17 DIAGNOSIS — Z01818 Encounter for other preprocedural examination: Secondary | ICD-10-CM | POA: Insufficient documentation

## 2017-06-17 DIAGNOSIS — R55 Syncope and collapse: Secondary | ICD-10-CM | POA: Diagnosis not present

## 2017-06-17 DIAGNOSIS — K219 Gastro-esophageal reflux disease without esophagitis: Secondary | ICD-10-CM | POA: Insufficient documentation

## 2017-06-17 DIAGNOSIS — Z01812 Encounter for preprocedural laboratory examination: Secondary | ICD-10-CM | POA: Diagnosis not present

## 2017-06-17 HISTORY — DX: Other complications of anesthesia, initial encounter: T88.59XA

## 2017-06-17 HISTORY — DX: Adverse effect of unspecified anesthetic, initial encounter: T41.45XA

## 2017-06-17 HISTORY — DX: Nausea with vomiting, unspecified: R11.2

## 2017-06-17 HISTORY — DX: Other specified postprocedural states: Z98.890

## 2017-06-17 LAB — CBC
HCT: 38.9 % (ref 36.0–46.0)
Hemoglobin: 12.8 g/dL (ref 12.0–15.0)
MCH: 29.9 pg (ref 26.0–34.0)
MCHC: 32.9 g/dL (ref 30.0–36.0)
MCV: 90.9 fL (ref 78.0–100.0)
Platelets: 235 10*3/uL (ref 150–400)
RBC: 4.28 MIL/uL (ref 3.87–5.11)
RDW: 14.2 % (ref 11.5–15.5)
WBC: 5.4 10*3/uL (ref 4.0–10.5)

## 2017-06-17 LAB — BASIC METABOLIC PANEL
Anion gap: 8 (ref 5–15)
BUN: 17 mg/dL (ref 6–20)
CO2: 26 mmol/L (ref 22–32)
Calcium: 8.7 mg/dL — ABNORMAL LOW (ref 8.9–10.3)
Chloride: 108 mmol/L (ref 101–111)
Creatinine, Ser: 0.91 mg/dL (ref 0.44–1.00)
GFR calc Af Amer: >60 mL/min (ref >60)
GFR calc non Af Amer: >60 mL/min (ref >60)
Glucose, Bld: 103 mg/dL — ABNORMAL HIGH (ref 65–99)
Potassium: 3.9 mmol/L (ref 3.5–5.1)
Sodium: 142 mmol/L (ref 135–145)

## 2017-06-18 NOTE — Progress Notes (Signed)
Final ekg done 06/17/17 in epic

## 2017-06-25 NOTE — Anesthesia Preprocedure Evaluation (Addendum)
Anesthesia Evaluation  Patient identified by MRN, date of birth, ID band Patient awake    Reviewed: Allergy & Precautions, NPO status , Patient's Chart, lab work & pertinent test results  History of Anesthesia Complications (+) PONV and history of anesthetic complications  Airway Mallampati: II  TM Distance: >3 FB Neck ROM: Full    Dental no notable dental hx.    Pulmonary neg pulmonary ROS, sleep apnea and Continuous Positive Airway Pressure Ventilation ,    Pulmonary exam normal breath sounds clear to auscultation       Cardiovascular Pt. on home beta blockers negative cardio ROS Normal cardiovascular exam+ dysrhythmias  Rhythm:Regular Rate:Normal     Neuro/Psych  Headaches, PSYCHIATRIC DISORDERS Anxiety Depression negative neurological ROS  negative psych ROS   GI/Hepatic negative GI ROS, Neg liver ROS, GERD  Medicated,  Endo/Other  negative endocrine ROSHypothyroidism Morbid obesity  Renal/GU negative Renal ROS  negative genitourinary   Musculoskeletal negative musculoskeletal ROS (+) Arthritis , Fibromyalgia -  Abdominal   Peds negative pediatric ROS (+)  Hematology negative hematology ROS (+)   Anesthesia Other Findings Echo 3/17 Impressions:  - Normal LV size with mild LV hypertrophy. EF 60-65%. Moderate   diastolic dysfunction. Normal RV size and systolic function. No   significant valvular abnormalities.  Reproductive/Obstetrics negative OB ROS                            Anesthesia Physical Anesthesia Plan  ASA: III  Anesthesia Plan: General   Post-op Pain Management:    Induction: Intravenous  PONV Risk Score and Plan: 2 and 3 and Ondansetron, Dexamethasone, Propofol infusion, Treatment may vary due to age or medical condition and Midazolam  Airway Management Planned: Oral ETT  Additional Equipment:   Intra-op Plan:   Post-operative Plan: Extubation in  OR  Informed Consent:   Plan Discussed with:   Anesthesia Plan Comments: (  )        Anesthesia Quick Evaluation

## 2017-06-25 NOTE — H&P (Signed)
Rebecca Marshall 05/14/2017 9:56 AM Location: [facility] Patient #: [id number] DOB: 06/06/1951 Single / Language: Cleophus Molt / Race: White Female   History of Present Illness Rodman Key B. Hassell Done MD; 05/14/2017 10:50 AM) The patient is a 66 year old female who presents with a hiatal hernia. She is referred by Dr. Cristina Gong and her primary care is Dr. Kathyrn Lass. She has a chronic cough and daily heartburn and frequent nocturnal regurgitation and pain that awakens her at night. She has been taking Protonix 40 mg twice a day and adding ranitidine at bedtime. She is symptomatic despite stopping coffee chocolate and fatty foods and eating late.   I showed her the same day of her upper GI series or you can see her hiatal hernia and reflux. She reports that her manometry was showed good motility but she had a very patulous distal lower esophageal sphincter.  I drew out and described laparoscopic Nissen fundoplication to her in some detail including long-term complications of wrap failure and recurrent hernias. Our goal however is to get her off her proton pump inhibitor and enable her to sleep flat and he later and did change some of the dietary restrictions that she has been under. She would like to get the surgery scheduled as soon as possible.  She denies any history of DVT. She has had a prior vaginal hysterectomy and laparoscopic cholecystectomy.   Past Surgical History Malachy Moan, [place]; 05/14/2017 9:57 AM) Breast Biopsy  Right. Cataract Surgery  Bilateral. Colon Polyp Removal - Colonoscopy  Foot Surgery  Right. Gallbladder Surgery - Laparoscopic  Hip Surgery  Left. Hysterectomy (not due to cancer) - Partial  Spinal Surgery - Lower Back  Thyroid Surgery  Tonsillectomy   Diagnostic Studies History Malachy Moan, RMA; 05/14/2017 9:57 AM) Colonoscopy  5-10 years ago Mammogram  within last year Pap Smear  >5 years ago  Allergies Malachy Moan, RMA;  05/14/2017 10:03 AM) Codeine Phosphate *ANALGESICS - OPIOID*  FentaNYL *ANALGESICS - OPIOID*  METFORMIN  Diarrhea. (intolerance)  Medication History Malachy Moan, RMA; 05/14/2017 10:07 AM) TraZODone HCl (100MG  Tablet, Oral) Active. CPAP Active. Flecainide Acetate (150MG  Tablet, Oral) Active. Pantoprazole Sodium (40MG  Packet, Oral) Active. PROzac (20MG  Capsule, Oral) Active. Relpax (Oral) Specific strength unknown - Active. Topiramate (25MG  Tablet, Oral) Active. Cyclobenzaprine HCl (10MG  Tablet, Oral) Active. Zofran (4MG  Tablet, Oral) Active. Medications Reconciled  Social History Malachy Moan, [place]; 05/14/2017 9:57 AM) Alcohol use  Occasional alcohol use. No caffeine use  No drug use  Tobacco use  Never smoker.  Family History Malachy Moan, [place]; 05/14/2017 9:57 AM) Alcohol Abuse  Father, Mother, Sister. Arthritis  Father, Sister. Breast Cancer  Sister. Depression  Father, Mother, Sister, Son. Migraine Headache  Daughter, Father, Mother, Sister, Son. Respiratory Condition  Mother.  Pregnancy / Birth History Malachy Moan, [place]; 05/14/2017 9:57 AM) Age at menarche  41 years. Age of menopause  13-50 Gravida  8 Length (months) of breastfeeding  3-6 Maternal age  40-20 Para  3  Other Problems Malachy Moan, [place]; 05/14/2017 9:57 AM) Anxiety Disorder  Arthritis  Back Pain  Depression  Gastric Ulcer  Gastroesophageal Reflux Disease  General anesthesia - complications  Migraine Headache  Sleep Apnea  Thyroid Cancer  Thyroid Disease     Review of Systems Malachy Moan RMA; 05/14/2017 9:57 AM) General Not Present- Appetite Loss, Chills, Fatigue, Fever, Night Sweats, Weight Gain and Weight Loss. Skin Not Present- Change in Wart/Mole, Dryness, Hives, Jaundice, New Lesions, Non-Healing Wounds, Rash and Ulcer. HEENT Present- Seasonal  Allergies. Not Present- Earache, Hearing Loss, Hoarseness, Nose Bleed, Oral Ulcers,  Ringing in the Ears, Sinus Pain, Sore Throat, Visual Disturbances, Wears glasses/contact lenses and Yellow Eyes. Respiratory Present- Chronic Cough and Snoring. Not Present- Bloody sputum, Difficulty Breathing and Wheezing. Breast Not Present- Breast Mass, Breast Pain, Nipple Discharge and Skin Changes. Cardiovascular Present- Palpitations. Not Present- Chest Pain, Difficulty Breathing Lying Down, Leg Cramps, Rapid Heart Rate, Shortness of Breath and Swelling of Extremities. Gastrointestinal Present- Chronic diarrhea, Indigestion and Nausea. Not Present- Abdominal Pain, Bloating, Bloody Stool, Change in Bowel Habits, Constipation, Difficulty Swallowing, Excessive gas, Gets full quickly at meals, Hemorrhoids, Rectal Pain and Vomiting. Female Genitourinary Present- Frequency, Nocturia and Urgency. Not Present- Painful Urination and Pelvic Pain. Musculoskeletal Present- Back Pain, Joint Pain, Joint Stiffness and Muscle Pain. Not Present- Muscle Weakness and Swelling of Extremities. Neurological Present- Headaches and Tremor. Not Present- Decreased Memory, Fainting, Numbness, Seizures, Tingling, Trouble walking and Weakness. Psychiatric Present- Change in Sleep Pattern. Not Present- Anxiety, Bipolar, Depression, Fearful and Frequent crying. Endocrine Present- Heat Intolerance. Not Present- Cold Intolerance, Excessive Hunger, Hair Changes, Hot flashes and New Diabetes. Hematology Not Present- Blood Thinners, Easy Bruising, Excessive bleeding, Gland problems, HIV and Persistent Infections.  Vitals Malachy Moan RMA; 05/14/2017 10:07 AM) 05/14/2017 10:07 AM Weight: 225 lb Temp.: 98.89F  Pulse: 70 (Regular)  BP: 104/76 (Sitting, Left Arm, Standard)       Physical Exam (Tiara Maultsby B. Hassell Done MD; 05/14/2017 10:51 AM) General Note: Mildly obese WF NAD HEENT unremarkable Neck no bruits Chest clear but she has chronic cough Heart SR without murmurs Abdomen nontender and with prior surgical  scars Ext FROM without hx of DVT Neuro alert and orient x 3 with normal appearing motor and sensory function.     Assessment & Plan Rodman Key B. Hassell Done MD; 05/14/2017 10:53 AM) HIATAL HERNIA WITH GERD AND ESOPHAGITIS (K44.9) Impression: Plan lap Nissen fundoplication. Patient wants to proceed as soon as possible. Informed consent obtained and I will give her a Krames booklet on surgical management of GER.

## 2017-06-26 ENCOUNTER — Encounter (HOSPITAL_COMMUNITY)
Admission: RE | Disposition: A | Discharge status: Home / Self Care | Payer: Self-pay | Source: Ambulatory Visit | Attending: Surgery

## 2017-06-26 ENCOUNTER — Inpatient Hospital Stay (HOSPITAL_COMMUNITY)
Admission: RE | Admit: 2017-06-26 | Discharge: 2017-06-28 | DRG: 328 | Disposition: A | Discharge status: Home / Self Care | Payer: BLUE CROSS/BLUE SHIELD | Source: Ambulatory Visit | Attending: Surgery | Admitting: Surgery

## 2017-06-26 ENCOUNTER — Encounter (HOSPITAL_COMMUNITY): Payer: Self-pay | Admitting: *Deleted

## 2017-06-26 ENCOUNTER — Inpatient Hospital Stay (HOSPITAL_COMMUNITY): Payer: BLUE CROSS/BLUE SHIELD | Admitting: Anesthesiology

## 2017-06-26 DIAGNOSIS — G4733 Obstructive sleep apnea (adult) (pediatric): Secondary | ICD-10-CM | POA: Diagnosis not present

## 2017-06-26 DIAGNOSIS — K219 Gastro-esophageal reflux disease without esophagitis: Secondary | ICD-10-CM | POA: Diagnosis present

## 2017-06-26 DIAGNOSIS — F418 Other specified anxiety disorders: Secondary | ICD-10-CM | POA: Diagnosis present

## 2017-06-26 DIAGNOSIS — J309 Allergic rhinitis, unspecified: Secondary | ICD-10-CM | POA: Diagnosis not present

## 2017-06-26 DIAGNOSIS — R05 Cough: Secondary | ICD-10-CM | POA: Diagnosis not present

## 2017-06-26 DIAGNOSIS — I10 Essential (primary) hypertension: Secondary | ICD-10-CM | POA: Diagnosis present

## 2017-06-26 DIAGNOSIS — Z6836 Body mass index (BMI) 36.0-36.9, adult: Secondary | ICD-10-CM

## 2017-06-26 DIAGNOSIS — Z9889 Other specified postprocedural states: Secondary | ICD-10-CM

## 2017-06-26 DIAGNOSIS — K449 Diaphragmatic hernia without obstruction or gangrene: Secondary | ICD-10-CM | POA: Diagnosis not present

## 2017-06-26 DIAGNOSIS — M797 Fibromyalgia: Secondary | ICD-10-CM | POA: Diagnosis not present

## 2017-06-26 DIAGNOSIS — Z85850 Personal history of malignant neoplasm of thyroid: Secondary | ICD-10-CM

## 2017-06-26 DIAGNOSIS — G4730 Sleep apnea, unspecified: Secondary | ICD-10-CM | POA: Diagnosis not present

## 2017-06-26 DIAGNOSIS — K224 Dyskinesia of esophagus: Secondary | ICD-10-CM | POA: Diagnosis not present

## 2017-06-26 DIAGNOSIS — Z90710 Acquired absence of both cervix and uterus: Secondary | ICD-10-CM | POA: Diagnosis not present

## 2017-06-26 DIAGNOSIS — Z888 Allergy status to other drugs, medicaments and biological substances status: Secondary | ICD-10-CM

## 2017-06-26 DIAGNOSIS — Z79899 Other long term (current) drug therapy: Secondary | ICD-10-CM

## 2017-06-26 DIAGNOSIS — Z885 Allergy status to narcotic agent status: Secondary | ICD-10-CM

## 2017-06-26 HISTORY — PX: LAPAROSCOPIC NISSEN FUNDOPLICATION: SHX1932

## 2017-06-26 HISTORY — DX: Other specified postprocedural states: Z98.890

## 2017-06-26 LAB — CREATININE, SERUM
Creatinine, Ser: 0.73 mg/dL (ref 0.44–1.00)
GFR calc Af Amer: >60 mL/min (ref >60)
GFR calc non Af Amer: >60 mL/min (ref >60)

## 2017-06-26 LAB — CBC
HCT: 40.8 % (ref 36.0–46.0)
Hemoglobin: 14 g/dL (ref 12.0–15.0)
MCH: 31.2 pg (ref 26.0–34.0)
MCHC: 34.3 g/dL (ref 30.0–36.0)
MCV: 90.9 fL (ref 78.0–100.0)
Platelets: 208 10*3/uL (ref 150–400)
RBC: 4.49 MIL/uL (ref 3.87–5.11)
RDW: 14.2 % (ref 11.5–15.5)
WBC: 10.9 10*3/uL — ABNORMAL HIGH (ref 4.0–10.5)

## 2017-06-26 SURGERY — FUNDOPLICATION, NISSEN, LAPAROSCOPIC
Anesthesia: General | Site: Abdomen

## 2017-06-26 MED ORDER — FENTANYL CITRATE (PF) 100 MCG/2ML IJ SOLN
25.0000 ug | INTRAMUSCULAR | Status: DC | PRN
  Administered 2017-06-26 (×2): 50 ug via INTRAVENOUS

## 2017-06-26 MED ORDER — MEPERIDINE HCL 50 MG/ML IJ SOLN: 6.2500 mg | INTRAMUSCULAR | Status: DC | PRN

## 2017-06-26 MED ORDER — PHENYLEPHRINE 40 MCG/ML (10ML) SYRINGE FOR IV PUSH (FOR BLOOD PRESSURE SUPPORT)
PREFILLED_SYRINGE | INTRAVENOUS | Status: AC
  Filled 2017-06-26: qty 10

## 2017-06-26 MED ORDER — SUGAMMADEX SODIUM 500 MG/5ML IV SOLN
INTRAVENOUS | Status: DC | PRN
  Administered 2017-06-26: 300 mg via INTRAVENOUS

## 2017-06-26 MED ORDER — LIDOCAINE 2% (20 MG/ML) 5 ML SYRINGE
INTRAMUSCULAR | Status: AC
  Filled 2017-06-26: qty 5

## 2017-06-26 MED ORDER — CEFAZOLIN SODIUM-DEXTROSE 2-4 GM/100ML-% IV SOLN
INTRAVENOUS | Status: AC
  Filled 2017-06-26: qty 100

## 2017-06-26 MED ORDER — SODIUM CHLORIDE 0.9 % IJ SOLN
INTRAMUSCULAR | Status: DC | PRN
  Administered 2017-06-26: 10 mL

## 2017-06-26 MED ORDER — HEPARIN SODIUM (PORCINE) 5000 UNIT/ML IJ SOLN
5000.0000 [IU] | Freq: Once | INTRAMUSCULAR | Status: AC
  Administered 2017-06-26: 5000 [IU] via SUBCUTANEOUS
  Filled 2017-06-26: qty 1

## 2017-06-26 MED ORDER — SUCCINYLCHOLINE CHLORIDE 200 MG/10ML IV SOSY
PREFILLED_SYRINGE | INTRAVENOUS | Status: DC | PRN
  Administered 2017-06-26: 120 mg via INTRAVENOUS

## 2017-06-26 MED ORDER — FENTANYL CITRATE (PF) 100 MCG/2ML IJ SOLN
INTRAMUSCULAR | Status: AC
  Filled 2017-06-26: qty 4

## 2017-06-26 MED ORDER — ONDANSETRON HCL 4 MG/2ML IJ SOLN
4.0000 mg | Freq: Four times a day (QID) | INTRAMUSCULAR | Status: DC | PRN
  Administered 2017-06-26 – 2017-06-27 (×2): 4 mg via INTRAVENOUS
  Filled 2017-06-26 (×2): qty 2

## 2017-06-26 MED ORDER — SODIUM CHLORIDE 0.9 % IJ SOLN
INTRAMUSCULAR | Status: AC
  Filled 2017-06-26: qty 10

## 2017-06-26 MED ORDER — CHLORHEXIDINE GLUCONATE CLOTH 2 % EX PADS: 6.0000 | MEDICATED_PAD | Freq: Once | CUTANEOUS | Status: DC

## 2017-06-26 MED ORDER — ONDANSETRON 4 MG PO TBDP
4.0000 mg | ORAL_TABLET | Freq: Four times a day (QID) | ORAL | Status: DC | PRN
  Filled 2017-06-26: qty 1

## 2017-06-26 MED ORDER — SCOPOLAMINE 1 MG/3DAYS TD PT72
MEDICATED_PATCH | TRANSDERMAL | Status: DC | PRN
  Administered 2017-06-26: 1 via TRANSDERMAL

## 2017-06-26 MED ORDER — ROCURONIUM BROMIDE 50 MG/5ML IV SOSY
PREFILLED_SYRINGE | INTRAVENOUS | Status: AC
  Filled 2017-06-26: qty 5

## 2017-06-26 MED ORDER — ONDANSETRON HCL 4 MG/2ML IJ SOLN
INTRAMUSCULAR | Status: AC
  Filled 2017-06-26: qty 2

## 2017-06-26 MED ORDER — ACETAMINOPHEN 500 MG PO TABS
1000.0000 mg | ORAL_TABLET | ORAL | Status: AC
  Administered 2017-06-26: 1000 mg via ORAL
  Filled 2017-06-26: qty 2

## 2017-06-26 MED ORDER — CEFAZOLIN SODIUM-DEXTROSE 2-4 GM/100ML-% IV SOLN
2.0000 g | INTRAVENOUS | Status: AC
  Administered 2017-06-26: 2 g via INTRAVENOUS

## 2017-06-26 MED ORDER — RINGERS IRRIGATION IR SOLN
Status: DC | PRN
  Administered 2017-06-26: 1000 mL

## 2017-06-26 MED ORDER — HEPARIN SODIUM (PORCINE) 5000 UNIT/ML IJ SOLN
5000.0000 [IU] | Freq: Three times a day (TID) | INTRAMUSCULAR | Status: DC
  Administered 2017-06-26 – 2017-06-28 (×5): 5000 [IU] via SUBCUTANEOUS
  Filled 2017-06-26 (×5): qty 1

## 2017-06-26 MED ORDER — MIDAZOLAM HCL 5 MG/5ML IJ SOLN
INTRAMUSCULAR | Status: DC | PRN
  Administered 2017-06-26: 2 mg via INTRAVENOUS

## 2017-06-26 MED ORDER — ONDANSETRON HCL 4 MG/2ML IJ SOLN: 4.0000 mg | Freq: Once | INTRAMUSCULAR | Status: DC | PRN

## 2017-06-26 MED ORDER — ONDANSETRON HCL 4 MG/2ML IJ SOLN
INTRAMUSCULAR | Status: DC | PRN
  Administered 2017-06-26: 4 mg via INTRAVENOUS

## 2017-06-26 MED ORDER — DEXAMETHASONE SODIUM PHOSPHATE 10 MG/ML IJ SOLN
INTRAMUSCULAR | Status: AC
  Filled 2017-06-26: qty 1

## 2017-06-26 MED ORDER — GABAPENTIN 300 MG PO CAPS
300.0000 mg | ORAL_CAPSULE | ORAL | Status: AC
  Administered 2017-06-26: 300 mg via ORAL
  Filled 2017-06-26: qty 1

## 2017-06-26 MED ORDER — PHENYLEPHRINE HCL 10 MG/ML IJ SOLN
INTRAMUSCULAR | Status: AC
  Filled 2017-06-26: qty 1

## 2017-06-26 MED ORDER — FENTANYL CITRATE (PF) 100 MCG/2ML IJ SOLN
INTRAMUSCULAR | Status: AC
  Filled 2017-06-26: qty 2

## 2017-06-26 MED ORDER — FENTANYL CITRATE (PF) 100 MCG/2ML IJ SOLN
12.5000 ug | INTRAMUSCULAR | Status: DC | PRN
  Administered 2017-06-26 – 2017-06-27 (×6): 12.5 ug via INTRAVENOUS
  Filled 2017-06-26 (×6): qty 2

## 2017-06-26 MED ORDER — PROCHLORPERAZINE EDISYLATE 5 MG/ML IJ SOLN
5.0000 mg | Freq: Four times a day (QID) | INTRAMUSCULAR | Status: DC | PRN
  Administered 2017-06-26: 10 mg via INTRAVENOUS
  Administered 2017-06-26: 5 mg via INTRAVENOUS
  Filled 2017-06-26 (×2): qty 2

## 2017-06-26 MED ORDER — PHENYLEPHRINE HCL 10 MG/ML IJ SOLN
INTRAVENOUS | Status: DC | PRN
  Administered 2017-06-26: 25 ug/min via INTRAVENOUS

## 2017-06-26 MED ORDER — PROPOFOL 10 MG/ML IV BOLUS
INTRAVENOUS | Status: AC
  Filled 2017-06-26: qty 20

## 2017-06-26 MED ORDER — PANTOPRAZOLE SODIUM 40 MG IV SOLR
40.0000 mg | Freq: Every day | INTRAVENOUS | Status: DC
  Administered 2017-06-26: 40 mg via INTRAVENOUS
  Filled 2017-06-26 (×3): qty 40

## 2017-06-26 MED ORDER — MIDAZOLAM HCL 2 MG/2ML IJ SOLN
INTRAMUSCULAR | Status: AC
  Filled 2017-06-26: qty 2

## 2017-06-26 MED ORDER — DEXAMETHASONE SODIUM PHOSPHATE 10 MG/ML IJ SOLN
INTRAMUSCULAR | Status: DC | PRN
  Administered 2017-06-26: 10 mg via INTRAVENOUS

## 2017-06-26 MED ORDER — ONDANSETRON 4 MG PO TBDP: 4.0000 mg | ORAL_TABLET | ORAL | Status: DC | PRN

## 2017-06-26 MED ORDER — FENTANYL CITRATE (PF) 100 MCG/2ML IJ SOLN
INTRAMUSCULAR | Status: DC | PRN
  Administered 2017-06-26: 100 ug via INTRAVENOUS

## 2017-06-26 MED ORDER — PROPOFOL 10 MG/ML IV BOLUS
INTRAVENOUS | Status: AC
  Filled 2017-06-26: qty 40

## 2017-06-26 MED ORDER — LACTATED RINGERS IV SOLN
INTRAVENOUS | Status: DC | PRN
  Administered 2017-06-26 (×3): via INTRAVENOUS

## 2017-06-26 MED ORDER — ROCURONIUM BROMIDE 10 MG/ML (PF) SYRINGE
PREFILLED_SYRINGE | INTRAVENOUS | Status: DC | PRN
  Administered 2017-06-26 (×3): 20 mg via INTRAVENOUS
  Administered 2017-06-26: 40 mg via INTRAVENOUS

## 2017-06-26 MED ORDER — LIDOCAINE 2% (20 MG/ML) 5 ML SYRINGE
INTRAMUSCULAR | Status: DC | PRN
  Administered 2017-06-26: 100 mg via INTRAVENOUS

## 2017-06-26 MED ORDER — LIP MEDEX EX OINT
TOPICAL_OINTMENT | CUTANEOUS | Status: AC
  Administered 2017-06-26: 16:00:00
  Filled 2017-06-26: qty 7

## 2017-06-26 MED ORDER — KCL IN DEXTROSE-NACL 20-5-0.45 MEQ/L-%-% IV SOLN
INTRAVENOUS | Status: DC
  Administered 2017-06-26 (×2): via INTRAVENOUS
  Filled 2017-06-26 (×2): qty 1000

## 2017-06-26 MED ORDER — PROPOFOL 10 MG/ML IV BOLUS
INTRAVENOUS | Status: DC | PRN
  Administered 2017-06-26: 160 mg via INTRAVENOUS

## 2017-06-26 MED ORDER — PHENYLEPHRINE 40 MCG/ML (10ML) SYRINGE FOR IV PUSH (FOR BLOOD PRESSURE SUPPORT)
PREFILLED_SYRINGE | INTRAVENOUS | Status: DC | PRN
  Administered 2017-06-26 (×6): 80 ug via INTRAVENOUS
  Administered 2017-06-26: 40 ug via INTRAVENOUS
  Administered 2017-06-26: 80 ug via INTRAVENOUS
  Administered 2017-06-26: 40 ug via INTRAVENOUS

## 2017-06-26 MED ORDER — SCOPOLAMINE 1 MG/3DAYS TD PT72
MEDICATED_PATCH | TRANSDERMAL | Status: AC
  Filled 2017-06-26: qty 1

## 2017-06-26 MED ORDER — PROPOFOL 500 MG/50ML IV EMUL
INTRAVENOUS | Status: DC | PRN
  Administered 2017-06-26: 50 ug/kg/min via INTRAVENOUS

## 2017-06-26 MED ORDER — PROCHLORPERAZINE MALEATE 10 MG PO TABS
10.0000 mg | ORAL_TABLET | Freq: Four times a day (QID) | ORAL | Status: DC | PRN
  Filled 2017-06-26: qty 1

## 2017-06-26 MED ORDER — SODIUM CHLORIDE 0.9 % IR SOLN
Status: DC | PRN
  Administered 2017-06-26: 1000 mL

## 2017-06-26 MED ORDER — SUCCINYLCHOLINE CHLORIDE 200 MG/10ML IV SOSY
PREFILLED_SYRINGE | INTRAVENOUS | Status: AC
  Filled 2017-06-26: qty 10

## 2017-06-26 MED ORDER — BUPIVACAINE LIPOSOME 1.3 % IJ SUSP
20.0000 mL | Freq: Once | INTRAMUSCULAR | Status: AC
Start: 2017-06-26 — End: 2017-06-26
  Administered 2017-06-26: 25 mL
  Filled 2017-06-26: qty 20

## 2017-06-26 SURGICAL SUPPLY — 42 items
APPLIER CLIP ROT 10 11.4 M/L (STAPLE)
APR CLP MED LRG 11.4X10 (STAPLE)
CABLE HIGH FREQUENCY MONO STRZ (ELECTRODE) ×2 IMPLANT
CLIP APPLIE ROT 10 11.4 M/L (STAPLE) IMPLANT
COVER SURGICAL LIGHT HANDLE (MISCELLANEOUS) ×2 IMPLANT
DECANTER SPIKE VIAL GLASS SM (MISCELLANEOUS) ×2 IMPLANT
DEVICE SUT QUICK LOAD TK 5 (STAPLE) ×10 IMPLANT
DEVICE SUT TI-KNOT TK 5X26 (MISCELLANEOUS) ×2 IMPLANT
DEVICE SUTURE ENDOST 10MM (ENDOMECHANICALS) ×2 IMPLANT
DISSECTOR BLUNT TIP ENDO 5MM (MISCELLANEOUS) ×2 IMPLANT
DRAIN PENROSE 18X1/2 LTX STRL (DRAIN) ×3 IMPLANT
DRAPE LAPAROSCOPIC ABDOMINAL (DRAPES) ×2 IMPLANT
ELECT REM PT RETURN 15FT ADLT (MISCELLANEOUS) ×2 IMPLANT
GLOVE BIOGEL M 8.0 STRL (GLOVE) ×2 IMPLANT
GOWN STRL REUS W/TWL XL LVL3 (GOWN DISPOSABLE) ×6 IMPLANT
GRASPER ENDO BABCOCK 10 (MISCELLANEOUS) IMPLANT
GRASPER ENDO BABCOCK 10MM (MISCELLANEOUS) ×2
KIT BASIN OR (CUSTOM PROCEDURE TRAY) ×2 IMPLANT
PAD POSITIONING PINK XL (MISCELLANEOUS) ×1 IMPLANT
POSITIONER SURGICAL ARM (MISCELLANEOUS) IMPLANT
RELOAD ENDO STITCH (ENDOMECHANICALS) ×6 IMPLANT
RELOAD SUT TRIPLE-STITCH 2-0 (ENDOMECHANICALS) ×3 IMPLANT
SCISSORS LAP 5X45 EPIX DISP (ENDOMECHANICALS) ×2 IMPLANT
SET IRRIG TUBING LAPAROSCOPIC (IRRIGATION / IRRIGATOR) ×2 IMPLANT
SHEARS HARMONIC ACE PLUS 45CM (MISCELLANEOUS) ×2 IMPLANT
SLEEVE ADV FIXATION 5X100MM (TROCAR) ×4 IMPLANT
STAPLER VISISTAT 35W (STAPLE) ×2 IMPLANT
SUT SURGIDAC NAB ES-9 0 48 120 (SUTURE) ×7 IMPLANT
SUT VIC AB 4-0 SH 18 (SUTURE) ×2 IMPLANT
TAPE CLOTH 4X10 WHT NS (GAUZE/BANDAGES/DRESSINGS) IMPLANT
TIP INNERVISION DETACH 40FR (MISCELLANEOUS) IMPLANT
TIP INNERVISION DETACH 50FR (MISCELLANEOUS) IMPLANT
TIP INNERVISION DETACH 56FR (MISCELLANEOUS) ×1 IMPLANT
TIPS INNERVISION DETACH 40FR (MISCELLANEOUS)
TOWEL OR 17X26 10 PK STRL BLUE (TOWEL DISPOSABLE) ×4 IMPLANT
TOWEL OR NON WOVEN STRL DISP B (DISPOSABLE) ×2 IMPLANT
TRAY FOLEY W/METER SILVER 16FR (SET/KITS/TRAYS/PACK) IMPLANT
TRAY LAPAROSCOPIC (CUSTOM PROCEDURE TRAY) ×2 IMPLANT
TROCAR ADV FIXATION 11X100MM (TROCAR) ×2 IMPLANT
TROCAR ADV FIXATION 5X100MM (TROCAR) ×2 IMPLANT
TROCAR BLADELESS OPT 5 100 (ENDOMECHANICALS) ×2 IMPLANT
TUBING INSUF HEATED (TUBING) ×2 IMPLANT

## 2017-06-26 NOTE — Anesthesia Procedure Notes (Signed)
Procedure Name: Intubation Date/Time: 06/26/2017 7:40 AM Performed by: Lind Covert Pre-anesthesia Checklist: Patient identified, Emergency Drugs available, Suction available, Patient being monitored and Timeout performed Patient Re-evaluated:Patient Re-evaluated prior to induction Oxygen Delivery Method: Circle system utilized Preoxygenation: Pre-oxygenation with 100% oxygen Induction Type: IV induction Laryngoscope Size: Mac and 4 Grade View: Grade I Tube type: Oral Tube size: 7.0 mm Number of attempts: 1 Airway Equipment and Method: Stylet Placement Confirmation: ETT inserted through vocal cords under direct vision,  positive ETCO2 and breath sounds checked- equal and bilateral Secured at: 22 cm Tube secured with: Tape Dental Injury: Teeth and Oropharynx as per pre-operative assessment

## 2017-06-26 NOTE — Op Note (Signed)
Surgeon: Kaylyn Lim, MD, FACS  Asst:  Alphonsa Overall, MD, FACS  Anes:  general  Preop Dx: Type III hiatal hernia with GERD Postop Dx: same  Procedure: Laparoscopic Nissen fundoplication and 4 suture repair of the hiatus Location Surgery: [facility] 4 Complications: None seen  EBL:   5 cc  Drains: none  Description of Procedure:  The patient was taken to OR 4 .  After anesthesia was administered and the patient was prepped a timeout was performed.  Access to the abdomen was achieved with a 5 mm Optiview through the left upper quadrant without difficulty. 65 mm bump ports were used one of which was exchanged for a 1112 tried to the right of midline. The stomach approximately the upper third was up in the chest with a modest sized hiatal defect.  Began dissection on the right crus with a Harmonic Scalpel taking down adhesions along the right crus and going up and taken these down out of the chest and reducing this. We carried this to the midline. We then began on the left side in a similar fashion divided the sac and stripped that out from the mediastinum. We were able to mobilize at least 3 cm of the esophagus into the abdomen. We had a Penrose drain around the esophagogastric junction and freed it from its tethering's laterally and get a good view of the right and left crura up posteriorly. These were then approximated with 3 sutures of 0 Surgidek using the Ryder System and doing extracorporeal ties.   56 lighted bougie was then passed and the upper stomach wrapped grabbed and brought around behind the esophagus and a complementary site on the anterior stomach was brought anterior. Shoeshine maneuver indicated that they were contiguous. The wrap was then performed with 3 sutures of 20 Surgidek with the uppermost one secured with a Ty knot and the 2 lower ones were intracorporeal ties. A fourth suture was placed with the 56 lighted bougie across the diaphragm anteriorly to snug up the diaphragmatic  closure. A total of 4 sutures were used to secure the diaphragm.  The bougie was removed. Everything appeared to be in order. Port sites were injected with Exparel.  Incisions were closed with 4-0 Monocryl and Dermabond.   The patient tolerated the procedure well and was taken to the PACU in stable condition.     Matt B. Hassell Done, [place], [facility], [place]

## 2017-06-26 NOTE — Transfer of Care (Signed)
Immediate Anesthesia Transfer of Care Note  Patient: Rebecca Marshall  Procedure(s) Performed: LAPAROSCOPIC NISSEN FUNDOPLICATION (N/A Abdomen)  Patient Location: PACU  Anesthesia Type:General  Level of Consciousness: sedated  Airway & Oxygen Therapy: Patient Spontanous Breathing and Patient connected to face mask oxygen  Post-op Assessment: Report given to RN and Post -op Vital signs reviewed and stable  Post vital signs: Reviewed and stable  Last Vitals:  Vitals:   06/26/17 0535  BP: 119/79  Pulse: 78  Resp: 16  Temp: 37.1 C  SpO2: 95%    Last Pain:  Vitals:   06/26/17 0535  TempSrc: Oral      Patients Stated Pain Goal: 4 (06/77/03 4035)  Complications: No apparent anesthesia complications

## 2017-06-26 NOTE — Interval H&P Note (Signed)
History and Physical Interval Note:  06/26/2017 7:29 AM  Rebecca Marshall  has presented today for surgery, with the diagnosis of GERD  The various methods of treatment have been discussed with the patient and family. After consideration of risks, benefits and other options for treatment, the patient has consented to  Procedure(s): LAPAROSCOPIC NISSEN FUNDOPLICATION (N/A) as a surgical intervention .  The patient's history has been reviewed, patient examined, no change in status, stable for surgery.  I have reviewed the patient's chart and labs.  Questions were answered to the patient's satisfaction.     Laurin Morgenstern B

## 2017-06-26 NOTE — Anesthesia Postprocedure Evaluation (Signed)
Anesthesia Post Note  Patient: Rebecca Marshall  Procedure(s) Performed: LAPAROSCOPIC NISSEN FUNDOPLICATION (N/A Abdomen)     Patient location during evaluation: PACU Anesthesia Type: General Level of consciousness: awake and alert Pain management: pain level controlled Vital Signs Assessment: post-procedure vital signs reviewed and stable Respiratory status: spontaneous breathing, nonlabored ventilation, respiratory function stable and patient connected to nasal cannula oxygen Cardiovascular status: blood pressure returned to baseline and stable Postop Assessment: no apparent nausea or vomiting Anesthetic complications: no    Last Vitals:  Vitals:   06/26/17 1033 06/26/17 1045  BP: 135/77 134/81  Pulse: 75 73  Resp: 18 16  Temp: (!) 36.4 C   SpO2: 100% 100%    Last Pain:  Vitals:   06/26/17 1045  TempSrc:   PainSc: Asleep                 Demetris Meinhardt

## 2017-06-26 NOTE — Progress Notes (Signed)
Dr Earlie Server office called to ask about pt diet status, there are no orders. They will page Dr Hassell Done. Donne Hazel, RN

## 2017-06-27 ENCOUNTER — Inpatient Hospital Stay (HOSPITAL_COMMUNITY): Payer: BLUE CROSS/BLUE SHIELD

## 2017-06-27 MED ORDER — PANTOPRAZOLE SODIUM 40 MG PO TBEC
40.0000 mg | DELAYED_RELEASE_TABLET | Freq: Two times a day (BID) | ORAL | Status: DC
  Administered 2017-06-27 – 2017-06-28 (×2): 40 mg via ORAL
  Filled 2017-06-27 (×2): qty 1

## 2017-06-27 MED ORDER — CYCLOBENZAPRINE HCL 10 MG PO TABS
10.0000 mg | ORAL_TABLET | Freq: Every day | ORAL | Status: DC
  Administered 2017-06-27: 10 mg via ORAL
  Filled 2017-06-27: qty 1

## 2017-06-27 MED ORDER — TRAMADOL HCL 50 MG PO TABS
100.0000 mg | ORAL_TABLET | Freq: Four times a day (QID) | ORAL | Status: DC
  Administered 2017-06-27 – 2017-06-28 (×3): 100 mg via ORAL
  Filled 2017-06-27 (×3): qty 2

## 2017-06-27 MED ORDER — FLECAINIDE ACETATE 50 MG PO TABS
150.0000 mg | ORAL_TABLET | Freq: Two times a day (BID) | ORAL | Status: DC
  Administered 2017-06-27 – 2017-06-28 (×2): 150 mg via ORAL
  Filled 2017-06-27 (×2): qty 1

## 2017-06-27 MED ORDER — IOPAMIDOL (ISOVUE-300) INJECTION 61%
INTRAVENOUS | Status: AC
  Administered 2017-06-27: 50 mL
  Filled 2017-06-27: qty 50

## 2017-06-27 NOTE — Progress Notes (Signed)
Patient ID: Rebecca Marshall, female   DOB: 10-14-1950, 66 y.o.   MRN: [id number] [facility] Progress Note:   1 Day Post-Op  Subjective: Mental status is alert and clear Objective: Vital signs in last 24 hours: Temp:  [98.4 F (36.9 C)-99.6 F (37.6 C)] 98.4 F (36.9 C) (10/05 1433) Pulse Rate:  [80-87] 80 (10/05 1433) Resp:  [16-18] 18 (10/05 1433) BP: (130-142)/(67-89) 142/89 (10/05 1433) SpO2:  [93 %-97 %] 95 % (10/05 1433)  Intake/Output from previous day: 10/04 0701 - 10/05 0700 In: 2853.3 [I.V.:2103.3] Out: 2270 [Urine:2250; Blood:20] Intake/Output this shift: Total I/O In: 240 [P.O.:240] Out: 1600 [Urine:1600]  Physical Exam: Work of breathing is normal.  Feeling better.  Sore but appropriate.    Lab Results:  Results for orders placed or performed during the hospital encounter of 06/26/17 (from the past 48 hour(s))  CBC     Status: Abnormal   Collection Time: 06/26/17 12:12 PM  Result Value Ref Range   WBC 10.9 (H) 4.0 - 10.5 K/uL   RBC 4.49 3.87 - 5.11 MIL/uL   Hemoglobin 14.0 12.0 - 15.0 g/dL   HCT 40.8 36.0 - 46.0 %   MCV 90.9 78.0 - 100.0 fL   MCH 31.2 26.0 - 34.0 pg   MCHC 34.3 30.0 - 36.0 g/dL   RDW 14.2 11.5 - 15.5 %   Platelets 208 150 - 400 K/uL  Creatinine, serum     Status: None   Collection Time: 06/26/17 12:12 PM  Result Value Ref Range   Creatinine, Ser 0.73 0.44 - 1.00 mg/dL   GFR calc non Af Amer >60 >60 mL/min   GFR calc Af Amer >60 >60 mL/min    Comment: (NOTE) The eGFR has been calculated using the CKD EPI equation. This calculation has not been validated in all clinical situations. eGFR's persistently <60 mL/min signify possible Chronic Kidney Disease.     Radiology/Results: Dg Duanne Limerick W/water Sol Cm  Result Date: 06/27/2017 CLINICAL DATA:  One day postop from Nissen fundoplication and hiatal hernia repair. EXAM: WATER SOLUBLE UPPER GI SERIES WITH KUB TECHNIQUE: Single-column upper GI series was performed using water soluble  contrast. CONTRAST:  Omnipaque water-soluble contrast COMPARISON:  03/17/2017 FLUOROSCOPY TIME:  Fluoroscopy Time:  0 minutes 48 seconds Radiation Exposure Index (if provided by the fluoroscopic device): 16.2 mGy Number of Acquired Spot Images: 0 FINDINGS: Scout radiograph shows a normal bowel gas pattern. Atelectasis in the left lung base. Right upper quadrant surgical clips noted. Water-soluble upper GI series shows no evidence of esophageal mass or obstruction. Esophageal dysmotility is seen with persistent tertiary contractions. Nissen fundoplication wrap is seen. No evidence of persistent hiatal hernia. No evidence of obstruction to passage of contrast. No evidence of contrast leak or extravasation. Remainder of the stomach is normal appearance. Prompt gastric emptying noted. Normal appearance of duodenal sweep. IMPRESSION: Expected postoperative appearance status post Nissen fundoplication. No evidence of postop obstruction or leak. Esophageal dysmotility. Electronically Signed   By: Earle Gell M.D.   On: 06/27/2017 09:11    Anti-infectives: Anti-infectives    Start     Dose/Rate Route Frequency Ordered Stop   06/26/17 0657  ceFAZolin (ANCEF) 2-4 GM/100ML-% IVPB    Comments:  Harle Stanford   : cabinet override      06/26/17 0657 06/26/17 0745   06/26/17 0533  ceFAZolin (ANCEF) IVPB 2g/100 mL premix     2 g 200 mL/hr over 30 Minutes Intravenous On call to O.R. 06/26/17 0533 06/26/17  0745      Assessment/Plan: Problem List: Patient Active Problem List   Diagnosis Date Noted  . Status post Nissen fundoplication 60/15/6153  . Poor compliance with CPAP treatment 03/24/2017  . Depression headache 03/06/2016  . Clinical depression 03/06/2016  . Rash and nonspecific skin eruption 01/09/2016  . Bilateral knee pain 06/29/2015  . Hip pain, chronic 04/05/2015  . OSA on CPAP 01/30/2015  . Snorings 01/30/2015  . Severe obesity (BMI >= 40) ([place]) 01/30/2015  . Nocturia more than twice per night  01/30/2015  . Hypoxemia 01/30/2015  . OSA (obstructive sleep apnea) 11/01/2014  . Cognitive changes 10/12/2014  . Memory change 05/12/2014  . Family history of diabetes mellitus (DM) 05/11/2014  . Herniated lumbar disc without myelopathy 12/10/2013  . AK (actinic keratosis) 04/16/2012  . Atrial tachycardia ([place]) 06/05/2011  . Bilateral bunions 12/18/2010  . Metatarsalgia 12/18/2010  . ANXIETY DEPRESSION 09/03/2010  . ADENOCARCINOMA, THYROID GLAND, PAPILLARY 05/17/2009  . ALLERGIC RHINITIS 01/30/2009  . 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 08/11/2007  . GERD 07/14/2007  . FIBROMYALGIA 07/14/2007    Doing well.  Started liquids.  Hopeful discharge Saturday.  1 Day Post-Op    LOS: 1 day   Matt B. Hassell Done, MD, [facility] [phone] beeper [phone]  06/27/2017 4:51 PM

## 2017-06-27 NOTE — Plan of Care (Signed)
Problem: Safety: Goal: Ability to remain free from injury will improve Outcome: Progressing No safety issues noted  Problem: Pain Managment: Goal: General experience of comfort will improve Outcome: Progressing Medicated twice for pain with moderate relief  Problem: Physical Regulation: Goal: Will remain free from infection Outcome: Progressing No signs of infection noted  Problem: Skin Integrity: Goal: Risk for impaired skin integrity will decrease Outcome: Progressing No skin issues noted, 7 lap sites with skin glue, dry and intact, no redness or drainage noted  Problem: Tissue Perfusion: Goal: Risk factors for ineffective tissue perfusion will decrease Outcome: Progressing SCDs are on, patient is on Heparin SQ, no signs of DVT reported  Problem: Activity: Goal: Risk for activity intolerance will decrease Outcome: Progressing Ambulated in the room and tolerated well  Problem: Fluid Volume: Goal: Ability to maintain a balanced intake and output will improve Outcome: Progressing Adequate intake and ouptut  Problem: Bowel/Gastric: Goal: Will not experience complications related to bowel motility Outcome: Progressing No gastric issues reported

## 2017-06-28 MED ORDER — TRAMADOL HCL 50 MG PO TABS: 100.0000 mg | ORAL_TABLET | Freq: Four times a day (QID) | ORAL | 1 refills | Status: DC

## 2017-06-28 MED ORDER — ONDANSETRON 4 MG PO TBDP: ORAL_TABLET | ORAL | 1 refills | Status: DC

## 2017-06-28 MED ORDER — PROCHLORPERAZINE MALEATE 10 MG PO TABS: 10.0000 mg | ORAL_TABLET | Freq: Four times a day (QID) | ORAL | 1 refills | Status: DC | PRN

## 2017-06-28 NOTE — Discharge Summary (Signed)
Physician Discharge Summary  Patient ID: Rebecca Marshall MRN: [id number] DOB/AGE: 03/21/1951 66 y.o.  Admit date: 06/26/2017 Discharge date: 06/28/2017  Admission Diagnoses: Patient Active Problem List   Diagnosis Date Noted  . Status post Nissen fundoplication 41/96/2229  . Poor compliance with CPAP treatment 03/24/2017  . Depression headache 03/06/2016  . Clinical depression 03/06/2016  . Rash and nonspecific skin eruption 01/09/2016  . Bilateral knee pain 06/29/2015  . Hip pain, chronic 04/05/2015  . OSA on CPAP 01/30/2015  . Snorings 01/30/2015  . Severe obesity (BMI >= 40) ([place]) 01/30/2015  . Nocturia more than twice per night 01/30/2015  . Hypoxemia 01/30/2015  . OSA (obstructive sleep apnea) 11/01/2014  . Cognitive changes 10/12/2014  . Memory change 05/12/2014  . Family history of diabetes mellitus (DM) 05/11/2014  . Herniated lumbar disc without myelopathy 12/10/2013  . AK (actinic keratosis) 04/16/2012  . Atrial tachycardia ([place]) 06/05/2011  . Bilateral bunions 12/18/2010  . Metatarsalgia 12/18/2010  . ANXIETY DEPRESSION 09/03/2010  . ADENOCARCINOMA, THYROID GLAND, PAPILLARY 05/17/2009  . ALLERGIC RHINITIS 01/30/2009  . 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 08/11/2007  . GERD 07/14/2007  . FIBROMYALGIA 07/14/2007    Discharge Diagnoses:  Active Problems:   Status post Nissen fundoplication and same as above  Discharged Condition: stable  Hospital Course:  Pt was admitted to the floor following lap Nissen with Dr. Hassell Done Jun 26, 2017.  She did well post op.  She says that her horrible reflux was immediately better upon waking up.  She has not had any significant pain.  She has been doing well aggressively treating nausea.  She is tolerating full liquids.  She is voiding well and ambulating independently.  She requests to be discharged.  She has had minimal pain medication, and has not taken any analgesic in 24 hours.  She is discharged to home in stable  condition.    Consults: None  Significant Diagnostic Studies: labs: see epic.  None notable.    Treatments: surgery: see above.    Discharge Exam: Blood pressure (!) 126/91, pulse 86, temperature 98.6 F (37 C), temperature source Oral, resp. rate 17, height 5' 7.5" (1.715 m), weight 106.1 kg (233 lb 12.8 oz), SpO2 95 %. General appearance: alert, cooperative and no distress Resp: breathing comfortably.  in chair. GI: soft, non distended, non tender. Extremities: extremities normal, atraumatic, no cyanosis or edema  Disposition: 01-Home or Self Care  Discharge Instructions    Call MD for:  persistant dizziness or light-headedness    Complete by:  As directed    Call MD for:  persistant nausea and vomiting    Complete by:  As directed    Call MD for:  redness, tenderness, or signs of infection (pain, swelling, redness, odor or green/yellow discharge around incision site)    Complete by:  As directed    Call MD for:  severe uncontrolled pain    Complete by:  As directed    Call MD for:  temperature >100.4    Complete by:  As directed    Discharge diet:    Complete by:  As directed    Full liquids.   Increase activity slowly    Complete by:  As directed    No wound care    Complete by:  As directed      Allergies as of 06/28/2017      Reactions   Nsaids Other (See Comments)   Rectal bleeding   Codeine Nausea And Vomiting  Fentanyl Other (See Comments)   "knocked out" previously with a high dose.  Can tolerate low doses.       Medication List    TAKE these medications   cetirizine 10 MG tablet Commonly known as:  ZYRTEC Take 10 mg by mouth daily as needed for allergies.   cyclobenzaprine 10 MG tablet Commonly known as:  FLEXERIL Take 10 mg by mouth at bedtime.   eletriptan 40 MG tablet Commonly known as:  RELPAX Take by mouth one at the beginning of headache and may repeat once in 2  Hours for maximum of 2 tabs in 24hours What changed:  how much to  take  how to take this  when to take this  reasons to take this  additional instructions   flecainide 150 MG tablet Commonly known as:  TAMBOCOR Take 1 tablet (150 mg total) by mouth 2 (two) times daily.   FLUoxetine 20 MG tablet Commonly known as:  PROZAC Take 1 tablet (20 mg total) by mouth daily.   levothyroxine 150 MCG tablet Commonly known as:  SYNTHROID, LEVOTHROID Take 1 tablet (150 mcg total) by mouth daily.   ondansetron 4 MG disintegrating tablet Commonly known as:  ZOFRAN ODT 4mg  ODT q4 hours prn nausea/vomit What changed:  how much to take  how to take this  when to take this  reasons to take this  additional instructions   pantoprazole 40 MG tablet Commonly known as:  PROTONIX TAKE 1 TABLET TWICE A DAY What changed:  See the new instructions.   prochlorperazine 10 MG tablet Commonly known as:  COMPAZINE Take 1 tablet (10 mg total) by mouth every 6 (six) hours as needed for nausea or vomiting (Use for nausea and / or vomiting unresolved with ondansetron (Zofran).).   sodium chloride 0.65 % Soln nasal spray Commonly known as:  OCEAN Place 1 spray into both nostrils as needed (For nasal dryness.).   topiramate 25 MG tablet Commonly known as:  TOPAMAX Take 75 mg by mouth at bedtime.   traMADol 50 MG tablet Commonly known as:  ULTRAM Take 2 tablets (100 mg total) by mouth every 6 (six) hours.   traZODone 100 MG tablet Commonly known as:  DESYREL Take 2 tablets (200 mg total) by mouth at bedtime.   triamcinolone cream 0.1 % Commonly known as:  KENALOG Apply 1 application topically 2 (two) times daily as needed (for ezcema).   VITAMIN D3 PO Take 1 capsule by mouth daily.      Follow-up Information    Johnathan Hausen, MD Follow up in 2 week(s).   Specialty:  General Surgery Contact information: [address] [place] [address] [phone]           Signed: Stark Klein 06/28/2017, 8:14 AM

## 2017-06-28 NOTE — Discharge Instructions (Signed)
[facility] ______[facility] LAPAROSCOPIC SURGERY: POST OP INSTRUCTIONS Always review your discharge instruction sheet given to you by the facility where your surgery was performed. IF YOU HAVE DISABILITY OR FAMILY LEAVE FORMS, YOU MUST BRING THEM TO THE OFFICE FOR PROCESSING.   DO NOT GIVE THEM TO YOUR DOCTOR.  1. A prescription for pain medication may be given to you upon discharge.  Take your pain medication as prescribed, if needed.  If narcotic pain medicine is not needed, then you may take acetaminophen (Tylenol) or ibuprofen (Advil) as needed. 2. Take your usually prescribed medications unless otherwise directed. 3. If you need a refill on your pain medication, please contact your pharmacy.  They will contact our office to request authorization. Prescriptions will not be filled after 5pm or on week-ends. 4. You should follow a full liquid diet for 2 weeks.  Be sure to include lots of fluids daily.  Make sure you get enough calories.   5. Most patients will experience some swelling and bruising in the area of the incisions.  Ice packs will help.  Swelling and bruising can take several days to resolve.  6. It is common to experience some constipation if taking pain medication after surgery.  Increasing fluid intake and taking a stool softener (such as Colace) will usually help or prevent this problem from occurring.  A mild laxative (Milk of Magnesia or Miralax) should be taken according to package instructions if there are no bowel movements after 48 hours. 7. Unless discharge instructions indicate otherwise, you may remove your bandages 24-48 hours after surgery, and you may shower at that time.  You may have steri-strips (small skin tapes) in place directly over the incision.  These strips should be left on the skin for 7-10 days.  If your surgeon used skin glue on the incision, you may shower in 24 hours.  The glue will flake off over the next 2-3 weeks.  Any sutures or staples will  be removed at the office during your follow-up visit. 8. ACTIVITIES:  You may resume regular (light) daily activities beginning the next day--such as daily self-care, walking, climbing stairs--gradually increasing activities as tolerated.  You may have sexual intercourse when it is comfortable.  Refrain from any heavy lifting or straining until approved by your doctor. a. You may drive when you are no longer taking prescription pain medication, you can comfortably wear a seatbelt, and you can safely maneuver your car and apply brakes. b. RETURN TO WORK:  __________________________________________________________ 9. You should see your doctor in the office for a follow-up appointment approximately 2-3 weeks after your surgery.  Make sure that you call for this appointment within a day or two after you arrive home to insure a convenient appointment time. 10. OTHER INSTRUCTIONS: __________________________________________________________________________________________________________________________ __________________________________________________________________________________________________________________________ WHEN TO CALL YOUR DOCTOR: 1. Fever over 101.0 2. Inability to urinate 3. Continued bleeding from incision. 4. Increased pain, redness, or drainage from the incision. 5. Increasing abdominal pain  The clinic staff is available to answer your questions during regular business hours.  Please dont hesitate to call and ask to speak to one of the nurses for clinical concerns.  If you have a medical emergency, go to the nearest emergency room or call 911.  A surgeon from [facility] is always on call at the hospital. [address], [place], [place], [place]  [address] ? [address], [place], [place]   [address] [phone] ? [phone] ? FAX [phone] Web site:  [email or web address] ° ° °

## 2017-06-28 NOTE — Progress Notes (Signed)
Assessment unchanged. Pt verbalized understanding of dc instructions through teach back including follow up care, when to call the doctor, diet regimen post sx, and meds to resume. Scripts x 3 given as provided by MD. Discharged via foot per request to front entrance accompanied by NT and husband.

## 2017-06-28 NOTE — Plan of Care (Signed)
Problem: Safety: Goal: Ability to remain free from injury will improve Outcome: Progressing No safety issues noted  Problem: Pain Managment: Goal: General experience of comfort will improve Outcome: Progressing Medicated once for pain with moderate relief  Problem: Bowel/Gastric: Goal: Will not experience complications related to bowel motility Outcome: Progressing No bowel/motility issues reported

## 2017-07-29 ENCOUNTER — Other Ambulatory Visit: Payer: Self-pay | Admitting: Family Medicine

## 2017-07-29 DIAGNOSIS — Z1231 Encounter for screening mammogram for malignant neoplasm of breast: Secondary | ICD-10-CM

## 2017-07-29 DIAGNOSIS — F331 Major depressive disorder, recurrent, moderate: Secondary | ICD-10-CM | POA: Diagnosis not present

## 2017-07-29 DIAGNOSIS — E6609 Other obesity due to excess calories: Secondary | ICD-10-CM | POA: Diagnosis not present

## 2017-08-18 DIAGNOSIS — M25551 Pain in right hip: Secondary | ICD-10-CM | POA: Diagnosis not present

## 2017-08-18 DIAGNOSIS — M545 Low back pain: Secondary | ICD-10-CM | POA: Diagnosis not present

## 2017-08-18 DIAGNOSIS — Z23 Encounter for immunization: Secondary | ICD-10-CM | POA: Diagnosis not present

## 2017-08-19 DIAGNOSIS — M25551 Pain in right hip: Secondary | ICD-10-CM | POA: Diagnosis not present

## 2017-08-19 MED FILL — traMADol HCL 50 MG TABS: 50 | 5 days supply | Qty: 40 | Fill #0

## 2017-08-19 MED FILL — CYCLOBENZAPRINE 10 MG TAB: 10 | 14 days supply | Qty: 40 | Fill #0

## 2017-08-26 ENCOUNTER — Ambulatory Visit
Admission: RE | Admit: 2017-08-26 | Discharge: 2017-08-26 | Disposition: A | Discharge status: Home / Self Care | Payer: BLUE CROSS/BLUE SHIELD | Source: Ambulatory Visit | Attending: Family Medicine | Admitting: Family Medicine

## 2017-08-26 DIAGNOSIS — Z1231 Encounter for screening mammogram for malignant neoplasm of breast: Secondary | ICD-10-CM

## 2017-08-29 DIAGNOSIS — M25551 Pain in right hip: Secondary | ICD-10-CM | POA: Diagnosis not present

## 2017-09-02 DIAGNOSIS — M25551 Pain in right hip: Secondary | ICD-10-CM | POA: Diagnosis not present

## 2017-09-05 ENCOUNTER — Telehealth: Payer: Self-pay

## 2017-09-05 NOTE — Telephone Encounter (Signed)
Called pt to let her know that she would need an appt before she could be cleared for her upcoming surgery.  Dr. Caryl Comes nor any of his assts didn't have any upcoming appts available.  Pt's surgery is 09/12/17. Pt was advised that I would send a message to Dr. Olin Pia scheduler and his team to have one of them call her back with an appt. Pt thanked me for the call.

## 2017-09-05 NOTE — Telephone Encounter (Signed)
   [facility] Pre-operative Risk Assessment    Request for surgical clearance:  1. What type of surgery is being performed? Right total hip arthroplasty   2. When is this surgery scheduled? Waiting for clearance   3. Are there any medications that need to be held prior to surgery and how long? None  4. Practice name and name of physician performing surgery? [facility], Dr. Dorna Leitz   5. What is your office phone and fax number? Lacretia Nicks [phone] phone, [phone] Fax   6. Anesthesia type (None, local, MAC, general) ? unknown   Rebecca Marshall 09/05/2017, 9:58 AM  _________________________________________________________________   (provider comments below)

## 2017-09-05 NOTE — Telephone Encounter (Signed)
   Primary Cardiologist: Dr. Caryl Comes Chart reviewed as part of pre-operative protocol coverage. Because of Rebecca Marshall's past medical history and time since last visit, he/she will require a follow-up visit in order to better assess preoperative cardiovascular risk.  Has not been seen since 2017.   Pre-op covering staff: - Please schedule appointment and call patient to inform them. - Please contact requesting surgeon's office via preferred method (i.e, phone, fax) to inform them of need for appointment prior to surgery.  Cecilie Kicks, NP  09/05/2017, 1:53 PM

## 2017-09-06 ENCOUNTER — Other Ambulatory Visit: Payer: Self-pay | Admitting: Orthopedic Surgery

## 2017-09-09 NOTE — Progress Notes (Addendum)
PCP: Kathyrn Lass, MD  Cardiologist: Virl Axe, MD  EKG: 06/17/17 in EPIC  Stress test:12/13/2015  ECHO: 12/13/2015  Cardiac Cath: pt denies  Chest x-ray: pt denies past year-no recent respiratory infection/complications

## 2017-09-09 NOTE — Pre-Procedure Instructions (Signed)
Rebecca Marshall  09/09/2017      [place], [place] - [address] [facility]. [address] [place] [address] Phone: [phone] Fax: [phone]  [facility] - [place], [place] [address] Phone: [phone] Fax: [phone]  [facility] [place], [place] [address] [place] [address] Phone: [phone] Fax: [place], [place] [address] Phone: [phone] Fax: [phone]    Your procedure is scheduled on September 12, 2017.  Report to [facility] Admitting at 530.  Call this number if you have problems the morning of surgery:  [phone]   Remember:  Do not eat food or drink liquids after midnight.  Take these medicines the morning of surgery with A SIP OF WATER cetirizine (zyrtec), cyclobenzaprin (flexeril), flecainide (tambocor), fluoxetine (prozac), levothyroxine (synthroid), ondansetron (zofran)-If needed for nausea, pantoprazole (protonix).  7 days prior to surgery STOP taking any Aspirin (unless otherwise instructed by your surgeon), Aleve, Naproxen, Ibuprofen, Motrin, Advil, Goody's, BC's, all herbal medications, fish oil, and all vitamins  Continue all other medications as instructed by your physician except follow the above medication instructions before surgery   Do not wear jewelry, make-up or nail polish.  Do not wear lotions, powders, or perfumes, or deodorant.  Do not shave 48 hours prior to surgery.   Do not bring valuables to the hospital.  [facility] is not responsible for any belongings or valuables.  Contacts, dentures or bridgework may not be worn into surgery.  Leave your suitcase in the car.  After surgery it may be brought to your room.  For patients admitted to  the hospital, discharge time will be determined by your treatment team.  Patients discharged the day of surgery will not be allowed to drive home.   Special instructions:   [facility]- Preparing For Surgery  Before surgery, you can play an important role. Because skin is not sterile, your skin needs to be as free of germs as possible. You can reduce the number of germs on your skin by washing with CHG (chlorahexidine gluconate) Soap before surgery.  CHG is an antiseptic cleaner which kills germs and bonds with the skin to continue killing germs even after washing.  Please do not use if you have an allergy to CHG or antibacterial soaps. If your skin becomes reddened/irritated stop using the CHG.  Do not shave (including legs and underarms) for at least 48 hours prior to first CHG shower. It is OK to shave your face.  Please follow these instructions carefully.   1. Shower the NIGHT BEFORE SURGERY and the MORNING OF SURGERY with CHG.   2. If you chose to wash your hair, wash your hair first as usual with your normal shampoo.  3. After you shampoo, rinse your hair and body thoroughly to remove the shampoo.  4. Use CHG as you would any other liquid soap. You can apply CHG directly to the skin and wash gently with a scrungie or a clean washcloth.   5. Apply the CHG Soap to your body ONLY FROM THE NECK DOWN.  Do not use on open wounds or open sores. Avoid contact with your eyes, ears, mouth and genitals (private parts). Wash Face and genitals (private parts)  with your normal soap.  6. Wash thoroughly, paying special attention to the area where your surgery will be performed.  7. Thoroughly rinse your body with warm water from the neck down.  8. DO NOT shower/wash with your normal soap after using and rinsing off the CHG Soap.  9. Pat yourself dry with a CLEAN TOWEL.  10. Wear CLEAN PAJAMAS to bed the night before surgery, wear comfortable clothes the morning of surgery  11. Place CLEAN  SHEETS on your bed the night of your first shower and DO NOT SLEEP WITH PETS.    Day of Surgery: Do not apply any deodorants/lotions. Please wear clean clothes to the hospital/surgery center.     Please read over the following fact sheets that you were given.

## 2017-09-10 ENCOUNTER — Encounter (HOSPITAL_COMMUNITY): Payer: Self-pay

## 2017-09-10 ENCOUNTER — Encounter (HOSPITAL_COMMUNITY)
Admission: RE | Admit: 2017-09-10 | Discharge: 2017-09-10 | Disposition: A | Discharge status: Home / Self Care | Payer: BLUE CROSS/BLUE SHIELD | Source: Ambulatory Visit | Attending: Orthopedic Surgery | Admitting: Orthopedic Surgery

## 2017-09-10 ENCOUNTER — Other Ambulatory Visit: Payer: Self-pay

## 2017-09-10 ENCOUNTER — Ambulatory Visit (INDEPENDENT_AMBULATORY_CARE_PROVIDER_SITE_OTHER): Payer: BLUE CROSS/BLUE SHIELD | Admitting: Internal Medicine

## 2017-09-10 VITALS — BP 112/70 | HR 80 | Ht 68.0 in | Wt 217.0 lb

## 2017-09-10 DIAGNOSIS — F329 Major depressive disorder, single episode, unspecified: Secondary | ICD-10-CM | POA: Diagnosis not present

## 2017-09-10 DIAGNOSIS — R001 Bradycardia, unspecified: Secondary | ICD-10-CM | POA: Diagnosis not present

## 2017-09-10 DIAGNOSIS — I2510 Atherosclerotic heart disease of native coronary artery without angina pectoris: Secondary | ICD-10-CM | POA: Diagnosis not present

## 2017-09-10 DIAGNOSIS — Z85850 Personal history of malignant neoplasm of thyroid: Secondary | ICD-10-CM | POA: Diagnosis not present

## 2017-09-10 DIAGNOSIS — Z79899 Other long term (current) drug therapy: Secondary | ICD-10-CM | POA: Diagnosis not present

## 2017-09-10 DIAGNOSIS — Z01810 Encounter for preprocedural cardiovascular examination: Secondary | ICD-10-CM

## 2017-09-10 DIAGNOSIS — G4733 Obstructive sleep apnea (adult) (pediatric): Secondary | ICD-10-CM | POA: Diagnosis not present

## 2017-09-10 DIAGNOSIS — Z96641 Presence of right artificial hip joint: Secondary | ICD-10-CM | POA: Diagnosis not present

## 2017-09-10 DIAGNOSIS — R002 Palpitations: Secondary | ICD-10-CM

## 2017-09-10 DIAGNOSIS — Z6832 Body mass index (BMI) 32.0-32.9, adult: Secondary | ICD-10-CM | POA: Diagnosis not present

## 2017-09-10 DIAGNOSIS — Z471 Aftercare following joint replacement surgery: Secondary | ICD-10-CM | POA: Diagnosis not present

## 2017-09-10 DIAGNOSIS — I471 Supraventricular tachycardia: Secondary | ICD-10-CM | POA: Diagnosis not present

## 2017-09-10 DIAGNOSIS — F419 Anxiety disorder, unspecified: Secondary | ICD-10-CM | POA: Diagnosis not present

## 2017-09-10 DIAGNOSIS — M1611 Unilateral primary osteoarthritis, right hip: Secondary | ICD-10-CM | POA: Diagnosis not present

## 2017-09-10 DIAGNOSIS — K219 Gastro-esophageal reflux disease without esophagitis: Secondary | ICD-10-CM | POA: Diagnosis not present

## 2017-09-10 DIAGNOSIS — M810 Age-related osteoporosis without current pathological fracture: Secondary | ICD-10-CM | POA: Diagnosis not present

## 2017-09-10 DIAGNOSIS — M797 Fibromyalgia: Secondary | ICD-10-CM | POA: Diagnosis not present

## 2017-09-10 DIAGNOSIS — R55 Syncope and collapse: Secondary | ICD-10-CM | POA: Diagnosis not present

## 2017-09-10 HISTORY — DX: Cardiac arrhythmia, unspecified: I49.9

## 2017-09-10 LAB — CBC WITH DIFFERENTIAL/PLATELET
Basophils Absolute: 0 10*3/uL (ref 0.0–0.1)
Basophils Relative: 0 %
Eosinophils Absolute: 0.1 10*3/uL (ref 0.0–0.7)
Eosinophils Relative: 1 %
HCT: 36.9 % (ref 36.0–46.0)
Hemoglobin: 11.9 g/dL — ABNORMAL LOW (ref 12.0–15.0)
Lymphocytes Relative: 30 %
Lymphs Abs: 1.6 10*3/uL (ref 0.7–4.0)
MCH: 30 pg (ref 26.0–34.0)
MCHC: 32.2 g/dL (ref 30.0–36.0)
MCV: 92.9 fL (ref 78.0–100.0)
Monocytes Absolute: 0.6 10*3/uL (ref 0.1–1.0)
Monocytes Relative: 11 %
Neutro Abs: 3 10*3/uL (ref 1.7–7.7)
Neutrophils Relative %: 58 %
Platelets: 261 10*3/uL (ref 150–400)
RBC: 3.97 MIL/uL (ref 3.87–5.11)
RDW: 14 % (ref 11.5–15.5)
WBC: 5.2 10*3/uL (ref 4.0–10.5)

## 2017-09-10 LAB — COMPREHENSIVE METABOLIC PANEL
ALT: 13 U/L — ABNORMAL LOW (ref 14–54)
AST: 25 U/L (ref 15–41)
Albumin: 3.8 g/dL (ref 3.5–5.0)
Alkaline Phosphatase: 79 U/L (ref 38–126)
Anion gap: 7 (ref 5–15)
BUN: 17 mg/dL (ref 6–20)
CO2: 24 mmol/L (ref 22–32)
Calcium: 8.4 mg/dL — ABNORMAL LOW (ref 8.9–10.3)
Chloride: 108 mmol/L (ref 101–111)
Creatinine, Ser: 0.78 mg/dL (ref 0.44–1.00)
GFR calc Af Amer: >60 mL/min (ref >60)
GFR calc non Af Amer: >60 mL/min (ref >60)
Glucose, Bld: 95 mg/dL (ref 65–99)
Potassium: 3.6 mmol/L (ref 3.5–5.1)
Sodium: 139 mmol/L (ref 135–145)
Total Bilirubin: 0.5 mg/dL (ref 0.3–1.2)
Total Protein: 6.6 g/dL (ref 6.5–8.1)

## 2017-09-10 LAB — URINALYSIS, ROUTINE W REFLEX MICROSCOPIC
Bilirubin Urine: NEGATIVE
Glucose, UA: NEGATIVE mg/dL
Ketones, ur: NEGATIVE mg/dL
Leukocytes, UA: NEGATIVE
Nitrite: NEGATIVE
Protein, ur: NEGATIVE mg/dL
Specific Gravity, Urine: 1.021 (ref 1.005–1.030)
pH: 6 (ref 5.0–8.0)

## 2017-09-10 LAB — SURGICAL PCR SCREEN
MRSA, PCR: NEGATIVE
Staphylococcus aureus: NEGATIVE

## 2017-09-10 LAB — PROTIME-INR
INR: 1.01
Prothrombin Time: 13.2 seconds (ref 11.4–15.2)

## 2017-09-10 LAB — APTT: aPTT: 26 seconds (ref 24–36)

## 2017-09-10 NOTE — Patient Instructions (Signed)
Medication Instructions: Your physician recommends that you continue on your current medications as directed. Please refer to the Current Medication list given to you today.  Labwork: None Ordered  Procedures/Testing: None Ordered  Follow-Up: Your physician wants you to follow-up in: 9 MONTHS with Dr. Caryl Comes. You will receive a reminder letter in the mail two months in advance. If you don't receive a letter, please call our office to schedule the follow-up appointment.   If you need a refill on your cardiac medications before your next appointment, please call your pharmacy.

## 2017-09-10 NOTE — Progress Notes (Signed)
Patient Care Team: Kathyrn Lass, MD as PCP - General (Family Medicine)   HPI  Rebecca Marshall is a 66 y.o. female Seen with a chief complaint of preoperative clearance.  She also has  atrial tachycardia    She continues to complain of palpitations syndromes of which there are 2 that are distinct.  The first is a rapid heartbeat which she can sit down and it will gradually abate over 3-5 minutes.  The second is a hard slow heartbeat duration which is about 5 seconds and frequently is associated with syncope.  In the event of presyncope palpitations are also only about 5 seconds long.  These events are associated with pallor residual fatigue and orthostatic intolerance  These episodes are occurring a couple of times a year, this is far less than it was before the flecainide was initiated.  A continue however with a stereo typical prodrome and recovery phase.    She has also had some issues with slow heart rates, i.e. in the 50s      DATE PR interval QRSduration Dose  7/16 21 118 100  3/17 17 109 150  12/18 22 110 150    Exercise tolerance is pretty good.  She is anticipating hip surgery on Friday, 2 days hence.  Exercise capacity is greater than 4 metastases.        echo>>with LA enlargement, not infrequently seen with atrail arrhtyhmias        Past Medical History:  Diagnosis Date  . Acne nodule 11/26/2011  . ADENOCARCINOMA, THYROID GLAND, PAPILLARY 05/17/2009   04/2009 Two surgeries for thyroid cancer Path report from last surgery :THYROID, LEFT, HEMITHYROIDECTOMY: - MULTIPLE FOCI OF FOLLICULAR VARIANT OR PAPILLARY THYROID CARCINOMA, 1.7 CM IN GREATEST DIMENSION, CONFINED WITHIN THE THYROID TISSUE. - ANGIOLYMPHATIC INVASION PRESENT. - ISTHMUS RESECTION MARGIN INVOLVED BY TUMOR. - FOLLICULAR ADENOMA(S) WITH ASSOCIATED LYMPHOCYTIC THYROIDITIS.     . AK (actinic keratosis) 04/16/2012  . ALLERGIC RHINITIS 01/30/2009   Qualifier: Diagnosis of  By: Andria Frames MD, Gwyndolyn Saxon    . Anxiety    . ANXIETY DEPRESSION 09/03/2010   Long standing depression with anxiety. Good response to medications initially. Has been to [facility].    Marland Kitchen Arthritis   . 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 08/11/2007   LEFT ZCH:YIFOY hip replacement (Dr Berenice Primas) with recalled hardware. Had revision by Dr Berenice Primas in 2011 with  ASR .Marland Kitchen    Marland Kitchen Atrial dysrhythmia    FOLLOWED BY DR Caryl Comes WAS HAVING PALPITATIONS AND SYNCOPAL EPISODES ONSET 9 YEARS AGO ; STARTED ON FLECAIINIDE , PER PATIENT TOLERATES WELL, LAST SYNCOPAL EPISODE WAS OVER 5 YEARS AGO ; SEE LOV IN  EPIC   . Atrial tachycardia ([place]) 06/05/2011   \Treated with flecainide-2011 2010-QRS duration 88 ms 2011 QRS duration 88 ms 2014 QRS duration 98 ms 2014 QRS 120mg    120 ms    . Bilateral bunions 12/18/2010  . Bilateral knee pain 06/29/2015  . [facility])    thyroid-status post resection on replacement Dr. Chalmers Cater  . Clinical depression 03/06/2016  . Cognitive changes 10/12/2014  . Complication of anesthesia    FENTANYL CAUSED ANAPHYLAXIS DURING CA COLONOSCOPY  . Depression   . Family history of diabetes mellitus (DM) 05/11/2014  . Fibromyalgia   . Gait abnormality 12/18/2010  . GE reflux   . GERD 07/14/2007   Qualifier: Diagnosis of  By: Nori Riis MD, Clarise Cruz    . Headache    migraines  . Headache  upon awakening 06/05/2011  . Herniated lumbar disc without myelopathy 12/10/2013   Had surgery by neurosurgery in December, 2014.L4-5 laminotomy and microdiscectomy Dr Margreta Journey Done at [facility]   . Hip pain, chronic 04/05/2015   Status post THR plus revision.   . Hypoxemia 01/30/2015  . Memory change 05/12/2014   Reports problems with short-term memory, focusing, concentration in the last 6-12 months. Problems were significant enough that she had to quit her part-time job is fairly distressed about this.   . Metatarsalgia 12/18/2010  . Nausea & vomiting 06/05/2011  . Nocturia more than twice per night 01/30/2015  . Nonallopathic lesion of cervical region  11/26/2011  . Nonallopathic lesion of lumbosacral region 11/26/2011  . OSA on CPAP 01/30/2015  . Osteoporosis   . PONV (postoperative nausea and vomiting)   . Poor compliance with CPAP treatment 03/24/2017  . Rash and nonspecific skin eruption 01/09/2016    located in the 10 o'clock position on Right breast    . Severe obesity (BMI >= 40) ([place]) 01/30/2015  . Sleep apnea 10/2015   x 1 year, CPAP  . Snorings 01/30/2015  . Status post Nissen fundoplication 06/28/2693  . Subacute cough 03/27/2012  . Thyroid disease   . Well adult health check 06/05/2011    Past Surgical History:  Procedure Laterality Date  . ABDOMINAL HYSTERECTOMY    . BREAST EXCISIONAL BIOPSY Right 25+ yrs ago   benign  . caroitid dopplers Bilateral 11/2015   normal  . CATARACT EXTRACTION Bilateral 09/05/15, 09/12/15  . CHOLECYSTECTOMY    . COLONOSCOPY  04/2011  . ESOPHAGEAL MANOMETRY N/A 03/31/2017   Procedure: ESOPHAGEAL MANOMETRY (EM);  Surgeon: Ronald Lobo, MD;  Location: [facility] ENDOSCOPY;  Service: Endoscopy;  Laterality: N/A;  . LAPAROSCOPIC NISSEN FUNDOPLICATION N/A 85/12/6268   Procedure: LAPAROSCOPIC NISSEN FUNDOPLICATION;  Surgeon: Johnathan Hausen, MD;  Location: [facility] ORS;  Service: General;  Laterality: N/A;  . THYROIDECTOMY  04/2009   papillary carcimao Dr. Erik Obey  . TOE SURGERY     bone spur removed - rt great toe  . TOTAL HIP ARTHROPLASTY  2009 and 2011   L hip   . TUBAL LIGATION      Current Outpatient Medications  Medication Sig Dispense Refill  . cetirizine (ZYRTEC) 10 MG tablet Take 10 mg by mouth daily as needed for allergies.    . Cholecalciferol (VITAMIN D3 PO) Take 1 capsule by mouth daily.    . cyclobenzaprine (FLEXERIL) 10 MG tablet Take 10 mg by mouth at bedtime.    Marland Kitchen eletriptan (RELPAX) 40 MG tablet Take by mouth one at the beginning of headache and may repeat once in 2  Hours for maximum of 2 tabs in 24hours (Patient taking differently: Take 40 mg by mouth every 2 (two) hours as needed for migraine or  headache. ) 27 tablet 3  . flecainide (TAMBOCOR) 150 MG tablet Take 1 tablet (150 mg total) by mouth 2 (two) times daily. 180 tablet 2  . FLUoxetine (PROZAC) 20 MG tablet Take 1 tablet (20 mg total) by mouth daily. (Patient taking differently: Take 40 mg by mouth daily. ) 90 tablet 3  . levothyroxine (SYNTHROID, LEVOTHROID) 150 MCG tablet Take 1 tablet (150 mcg total) by mouth daily. 90 tablet 1  . ondansetron (ZOFRAN ODT) 4 MG disintegrating tablet 4mg  ODT q4 hours prn nausea/vomit 30 tablet 1  . pantoprazole (PROTONIX) 40 MG tablet TAKE 1 TABLET TWICE A DAY (Patient taking differently: Take 40 mg by mouth twice daily)  180 tablet 2  . prochlorperazine (COMPAZINE) 10 MG tablet Take 1 tablet (10 mg total) by mouth every 6 (six) hours as needed for nausea or vomiting (Use for nausea and / or vomiting unresolved with ondansetron (Zofran).). 20 tablet 1  . sodium chloride (OCEAN) 0.65 % SOLN nasal spray Place 1 spray into both nostrils as needed (For nasal dryness.).    Marland Kitchen topiramate (TOPAMAX) 25 MG tablet Take 75 mg by mouth at bedtime.    . traMADol (ULTRAM) 50 MG tablet Take 2 tablets (100 mg total) by mouth every 6 (six) hours. 30 tablet 1  . traZODone (DESYREL) 100 MG tablet Take 2 tablets (200 mg total) by mouth at bedtime. 180 tablet 2  . triamcinolone cream (KENALOG) 0.1 % Apply 1 application topically 2 (two) times daily as needed (for ezcema).     No current facility-administered medications for this visit.     Allergies  Allergen Reactions  . Nsaids Other (See Comments)    Rectal bleeding  . Codeine Nausea And Vomiting  . Fentanyl Other (See Comments)    "knocked out" previously with a high dose.  Can tolerate low doses.     Review of Systems negative except from HPI and PMH  Physical Exam Ht 5\' 8"  (1.727 m)   Wt 217 lb (98.4 kg)   BMI 32.99 kg/m  Well developed and nourished in no acute distress HENT normal Neck supple with JVP-flat Carotids brisk and full without  bruits Clear Regular rate and rhythm, no murmurs or gallops Abd-soft with active BS without hepatomegaly No Clubbing cyanosis edema Skin-warm and dry A & Oriented  Grossly normal sensory and motor function       ECG demonstrates sinus rhythm   80 22/11/41    Assessment and  Plan  Obstructive sleep apnea-treated  Fatigue  Bradycardia-reported  Atrial tachycardia/palpitations-recurrent  Syncope prob neurally mediated  Preoperative assessment      The patient's surgical risk should be acceptable.  Functional capacity is adequate.  The caveat for anesthesia is that she might be volume sensitive in terms of vaso-mediated hypotension.  Mechanism of the palpitations is not clear.  I am not sure whether this arrhythmia that is triggering a neurally mediated reflex or it is the symptoms associated with a sympathetic discharge giving rise to palpitations as part of the reflex.  We have discussed the physiology of basal mediated hypotension and the importance of volume repletion.  Her normal blood pressure is 1 in the 90 range.  I have encouraged her to take salt supplements she does not like salty foods.  I have given her the names of different salt supplements.  We will continue her on her flecainide.

## 2017-09-11 NOTE — Progress Notes (Signed)
Anesthesia Chart Review:  Pt is a 66 year old female scheduled for R total hip arthroplasty anterior approach on 09/12/2017 with Dorna Leitz, MD  - PCP is Kathyrn Lass, MD - EP cardiologist is Virl Axe, MD who cleared pt for surgery at last office visit 09/10/17  PMH includes:  Atrial tachycardia, OSA, thyroid cancer (s/p thyroidectomy), post-op N/V, GERD. Never smoker. BMI 33. S/p nissen fundoplication 74/2/59.   Medications include: flecainide, levothyroxine  BP 115/68   Pulse 74   Temp 37 C   Resp 20   Ht 5\' 8"  (1.727 m)   Wt 217 lb (98.4 kg)   SpO2 98%   BMI 32.99 kg/m   Preoperative labs reviewed.   EKG 06/17/17: NSR. LAD. Cannot rule out Anterior infarct, age undetermined  Nuclear stress test 12/13/15:   Nuclear stress EF: 72%.  Blood pressure demonstrated a hypertensive response to exercise.  There was no ST segment deviation noted during stress.  No T wave inversion was noted during stress.  Defect 1: There is a small defect of mild severity present in the apical lateral location.  This is a low risk study.  Carotid duplex 12/06/15:  - Normal carotid arteries, bilaterally. - Patent vertebral arteries with antegrade flow. - Normal subclavian arteries, bilaterally.  Echo 12/06/15:  - Left ventricle: The cavity size was normal. Wall thickness was increased in a pattern of mild LVH. Systolic function was normal. The estimated ejection fraction was in the range of 60% to 65%. Wall motion was normal; there were no regional wall motion abnormalities. Features are consistent with a pseudonormal left ventricular filling pattern, with concomitant abnormal relaxation and increased filling pressure (grade 2 diastolic dysfunction). - Aortic valve: There was no stenosis. There was trivial regurgitation. - Mitral valve: There was no significant regurgitation. - Left atrium: The atrium was moderately dilated. - Right ventricle: The cavity size was normal. Systolic function  was normal. - Right atrium: The atrium was mildly dilated. - Pulmonary arteries: PA peak pressure: 23 mm Hg (S). - Inferior vena cava: The vessel was normal in size. The respirophasic diameter changes were in the normal range (>= 50%), consistent with normal central venous pressure. - Impressions: Normal LV size with mild LV hypertrophy. EF 60-65%. Moderate diastolic dysfunction. Normal RV size and systolic function. No significant valvular abnormalities.  If no changes, I anticipate pt can proceed with surgery as scheduled.   Willeen Cass, FNP-BC [facility] Short Stay Surgical Center/Anesthesiology Phone: [phone] 09/11/2017 9:36 AM

## 2017-09-12 ENCOUNTER — Inpatient Hospital Stay (HOSPITAL_COMMUNITY): Payer: BLUE CROSS/BLUE SHIELD

## 2017-09-12 ENCOUNTER — Inpatient Hospital Stay (HOSPITAL_COMMUNITY): Payer: BLUE CROSS/BLUE SHIELD | Admitting: Certified Registered Nurse Anesthetist

## 2017-09-12 ENCOUNTER — Encounter (HOSPITAL_COMMUNITY): Payer: Self-pay | Admitting: *Deleted

## 2017-09-12 ENCOUNTER — Inpatient Hospital Stay (HOSPITAL_COMMUNITY): Payer: BLUE CROSS/BLUE SHIELD | Admitting: Emergency Medicine

## 2017-09-12 ENCOUNTER — Encounter (HOSPITAL_COMMUNITY)
Admission: RE | Disposition: A | Discharge status: Home Health | Payer: Self-pay | Source: Ambulatory Visit | Attending: Orthopedic Surgery

## 2017-09-12 ENCOUNTER — Other Ambulatory Visit: Payer: Self-pay

## 2017-09-12 ENCOUNTER — Inpatient Hospital Stay (HOSPITAL_COMMUNITY)
Admission: RE | Admit: 2017-09-12 | Discharge: 2017-09-13 | DRG: 470 | Disposition: A | Discharge status: Home Health | Payer: BLUE CROSS/BLUE SHIELD | Source: Ambulatory Visit | Attending: Orthopedic Surgery | Admitting: Orthopedic Surgery

## 2017-09-12 DIAGNOSIS — Z96641 Presence of right artificial hip joint: Secondary | ICD-10-CM | POA: Diagnosis not present

## 2017-09-12 DIAGNOSIS — K219 Gastro-esophageal reflux disease without esophagitis: Secondary | ICD-10-CM | POA: Diagnosis present

## 2017-09-12 DIAGNOSIS — M810 Age-related osteoporosis without current pathological fracture: Secondary | ICD-10-CM | POA: Diagnosis present

## 2017-09-12 DIAGNOSIS — M1611 Unilateral primary osteoarthritis, right hip: Principal | ICD-10-CM | POA: Diagnosis present

## 2017-09-12 DIAGNOSIS — I2510 Atherosclerotic heart disease of native coronary artery without angina pectoris: Secondary | ICD-10-CM | POA: Diagnosis present

## 2017-09-12 DIAGNOSIS — Z419 Encounter for procedure for purposes other than remedying health state, unspecified: Secondary | ICD-10-CM

## 2017-09-12 DIAGNOSIS — F329 Major depressive disorder, single episode, unspecified: Secondary | ICD-10-CM | POA: Diagnosis present

## 2017-09-12 DIAGNOSIS — Z6832 Body mass index (BMI) 32.0-32.9, adult: Secondary | ICD-10-CM

## 2017-09-12 DIAGNOSIS — M797 Fibromyalgia: Secondary | ICD-10-CM | POA: Diagnosis present

## 2017-09-12 DIAGNOSIS — F419 Anxiety disorder, unspecified: Secondary | ICD-10-CM | POA: Diagnosis present

## 2017-09-12 DIAGNOSIS — Z85850 Personal history of malignant neoplasm of thyroid: Secondary | ICD-10-CM

## 2017-09-12 DIAGNOSIS — G4733 Obstructive sleep apnea (adult) (pediatric): Secondary | ICD-10-CM | POA: Diagnosis present

## 2017-09-12 DIAGNOSIS — Z79899 Other long term (current) drug therapy: Secondary | ICD-10-CM

## 2017-09-12 DIAGNOSIS — Z471 Aftercare following joint replacement surgery: Secondary | ICD-10-CM | POA: Diagnosis not present

## 2017-09-12 HISTORY — PX: TOTAL HIP ARTHROPLASTY: SHX124

## 2017-09-12 LAB — BPAM RBC
Blood Product Expiration Date: 201901142359
Blood Product Expiration Date: 201901152359
Unit Type and Rh: 5100
Unit Type and Rh: 5100

## 2017-09-12 LAB — TYPE AND SCREEN
ABO/RH(D): O POS
Antibody Screen: POSITIVE
Donor AG Type: NEGATIVE
Donor AG Type: NEGATIVE
Unit division: 0
Unit division: 0

## 2017-09-12 SURGERY — ARTHROPLASTY, HIP, TOTAL, ANTERIOR APPROACH
Anesthesia: Spinal | Site: Hip | Laterality: Right

## 2017-09-12 MED ORDER — ASPIRIN EC 325 MG PO TBEC
325.0000 mg | DELAYED_RELEASE_TABLET | Freq: Two times a day (BID) | ORAL | Status: DC
  Administered 2017-09-13: 325 mg via ORAL
  Filled 2017-09-12: qty 1

## 2017-09-12 MED ORDER — CEFAZOLIN SODIUM-DEXTROSE 2-4 GM/100ML-% IV SOLN
2.0000 g | Freq: Four times a day (QID) | INTRAVENOUS | Status: AC
  Administered 2017-09-12 (×2): 2 g via INTRAVENOUS
  Filled 2017-09-12 (×2): qty 100

## 2017-09-12 MED ORDER — HYDROMORPHONE HCL 2 MG PO TABS: 2.0000 mg | ORAL_TABLET | Freq: Four times a day (QID) | ORAL | 0 refills | Status: DC | PRN

## 2017-09-12 MED ORDER — HYDROMORPHONE HCL 2 MG PO TABS
2.0000 mg | ORAL_TABLET | ORAL | Status: DC | PRN
  Administered 2017-09-12 – 2017-09-13 (×3): 4 mg via ORAL
  Filled 2017-09-12 (×3): qty 2

## 2017-09-12 MED ORDER — DEXTROSE 5 % IV SOLN
500.0000 mg | Freq: Four times a day (QID) | INTRAVENOUS | Status: DC | PRN
  Filled 2017-09-12: qty 5

## 2017-09-12 MED ORDER — DOCUSATE SODIUM 100 MG PO CAPS
100.0000 mg | ORAL_CAPSULE | Freq: Two times a day (BID) | ORAL | Status: DC
  Administered 2017-09-12 – 2017-09-13 (×2): 100 mg via ORAL
  Filled 2017-09-12 (×2): qty 1

## 2017-09-12 MED ORDER — HYDROMORPHONE HCL 1 MG/ML IJ SOLN
0.5000 mg | INTRAMUSCULAR | Status: DC | PRN
  Administered 2017-09-12 – 2017-09-13 (×4): 1 mg via INTRAVENOUS
  Filled 2017-09-12 (×4): qty 1

## 2017-09-12 MED ORDER — HYDROMORPHONE HCL 1 MG/ML IJ SOLN
0.2500 mg | INTRAMUSCULAR | Status: DC | PRN
  Administered 2017-09-12 (×4): 0.5 mg via INTRAVENOUS

## 2017-09-12 MED ORDER — ALBUMIN HUMAN 5 % IV SOLN
INTRAVENOUS | Status: DC | PRN
  Administered 2017-09-12: 09:00:00 via INTRAVENOUS

## 2017-09-12 MED ORDER — CYCLOSPORINE 0.05 % OP EMUL
1.0000 [drp] | Freq: Four times a day (QID) | OPHTHALMIC | Status: DC
  Administered 2017-09-12 – 2017-09-13 (×2): 1 [drp] via OPHTHALMIC
  Filled 2017-09-12 (×3): qty 1

## 2017-09-12 MED ORDER — EPHEDRINE SULFATE 50 MG/ML IJ SOLN
INTRAMUSCULAR | Status: DC | PRN
  Administered 2017-09-12 (×2): 5 mg via INTRAVENOUS

## 2017-09-12 MED ORDER — CYCLOBENZAPRINE HCL 10 MG PO TABS: 10.0000 mg | ORAL_TABLET | Freq: Three times a day (TID) | ORAL | 0 refills | Status: DC | PRN

## 2017-09-12 MED ORDER — LACTATED RINGERS IV SOLN
INTRAVENOUS | Status: DC | PRN
  Administered 2017-09-12: 07:00:00 via INTRAVENOUS

## 2017-09-12 MED ORDER — ONDANSETRON HCL 4 MG PO TABS: 4.0000 mg | ORAL_TABLET | Freq: Four times a day (QID) | ORAL | Status: DC | PRN

## 2017-09-12 MED ORDER — HYDROMORPHONE HCL 1 MG/ML IJ SOLN
INTRAMUSCULAR | Status: AC
  Administered 2017-09-12: 10:00:00
  Filled 2017-09-12: qty 1

## 2017-09-12 MED ORDER — OXYCODONE HCL 5 MG/5ML PO SOLN: 5.0000 mg | Freq: Once | ORAL | Status: AC | PRN

## 2017-09-12 MED ORDER — 0.9 % SODIUM CHLORIDE (POUR BTL) OPTIME
TOPICAL | Status: DC | PRN
  Administered 2017-09-12: 1000 mL

## 2017-09-12 MED ORDER — TOPIRAMATE 25 MG PO TABS
50.0000 mg | ORAL_TABLET | Freq: Every day | ORAL | Status: DC
  Administered 2017-09-12: 50 mg via ORAL
  Filled 2017-09-12: qty 2

## 2017-09-12 MED ORDER — OXYCODONE HCL 5 MG PO TABS
5.0000 mg | ORAL_TABLET | Freq: Once | ORAL | Status: AC | PRN
  Administered 2017-09-12: 5 mg via ORAL

## 2017-09-12 MED ORDER — ALUM & MAG HYDROXIDE-SIMETH 200-200-20 MG/5ML PO SUSP: 30.0000 mL | ORAL | Status: DC | PRN

## 2017-09-12 MED ORDER — MIDAZOLAM HCL 2 MG/2ML IJ SOLN
INTRAMUSCULAR | Status: AC
  Filled 2017-09-12: qty 2

## 2017-09-12 MED ORDER — SODIUM CHLORIDE 0.9 % IV SOLN
1000.0000 mg | INTRAVENOUS | Status: AC
  Administered 2017-09-12: 1000 mg via INTRAVENOUS
  Filled 2017-09-12: qty 1100

## 2017-09-12 MED ORDER — BUPIVACAINE LIPOSOME 1.3 % IJ SUSP: 20.0000 mL | Freq: Once | INTRAMUSCULAR | Status: DC

## 2017-09-12 MED ORDER — BUPIVACAINE HCL (PF) 0.5 % IJ SOLN
INTRAMUSCULAR | Status: AC
  Filled 2017-09-12: qty 30

## 2017-09-12 MED ORDER — CEFAZOLIN SODIUM-DEXTROSE 2-4 GM/100ML-% IV SOLN
2.0000 g | INTRAVENOUS | Status: AC
  Administered 2017-09-12: 2 g via INTRAVENOUS
  Filled 2017-09-12: qty 100

## 2017-09-12 MED ORDER — MAGNESIUM CITRATE PO SOLN: 1.0000 | Freq: Once | ORAL | Status: DC | PRN

## 2017-09-12 MED ORDER — ACETAMINOPHEN 325 MG PO TABS: 650.0000 mg | ORAL_TABLET | ORAL | Status: DC | PRN

## 2017-09-12 MED ORDER — PROMETHAZINE HCL 25 MG/ML IJ SOLN: 6.2500 mg | INTRAMUSCULAR | Status: DC | PRN

## 2017-09-12 MED ORDER — DEXAMETHASONE SODIUM PHOSPHATE 10 MG/ML IJ SOLN
10.0000 mg | Freq: Two times a day (BID) | INTRAMUSCULAR | Status: DC
Start: 2017-09-12 — End: 2017-09-13
  Administered 2017-09-12 – 2017-09-13 (×2): 10 mg via INTRAVENOUS
  Filled 2017-09-12 (×2): qty 1

## 2017-09-12 MED ORDER — METHOCARBAMOL 500 MG PO TABS
500.0000 mg | ORAL_TABLET | Freq: Four times a day (QID) | ORAL | Status: DC | PRN
  Administered 2017-09-12 – 2017-09-13 (×4): 500 mg via ORAL
  Filled 2017-09-12 (×4): qty 1

## 2017-09-12 MED ORDER — MIDAZOLAM HCL 5 MG/5ML IJ SOLN
INTRAMUSCULAR | Status: DC | PRN
Start: 2017-09-12 — End: 2017-09-12
  Administered 2017-09-12: 2 mg via INTRAVENOUS

## 2017-09-12 MED ORDER — DIPHENHYDRAMINE HCL 12.5 MG/5ML PO ELIX: 12.5000 mg | ORAL_SOLUTION | ORAL | Status: DC | PRN

## 2017-09-12 MED ORDER — BUPIVACAINE IN DEXTROSE 0.75-8.25 % IT SOLN
INTRATHECAL | Status: DC | PRN
  Administered 2017-09-12: 2 mL via INTRATHECAL

## 2017-09-12 MED ORDER — DOCUSATE SODIUM 100 MG PO CAPS: 100.0000 mg | ORAL_CAPSULE | Freq: Two times a day (BID) | ORAL | 0 refills | Status: DC

## 2017-09-12 MED ORDER — SODIUM CHLORIDE 0.9 % IV SOLN: 1000.0000 mg | INTRAVENOUS | Status: DC

## 2017-09-12 MED ORDER — ONDANSETRON HCL 4 MG/2ML IJ SOLN: 4.0000 mg | Freq: Four times a day (QID) | INTRAMUSCULAR | Status: DC | PRN

## 2017-09-12 MED ORDER — TRANEXAMIC ACID 1000 MG/10ML IV SOLN
1000.0000 mg | Freq: Once | INTRAVENOUS | Status: AC
  Administered 2017-09-12: 1000 mg via INTRAVENOUS
  Filled 2017-09-12 (×2): qty 10

## 2017-09-12 MED ORDER — PROPOFOL 10 MG/ML IV BOLUS
INTRAVENOUS | Status: AC
  Filled 2017-09-12: qty 20

## 2017-09-12 MED ORDER — ACETAMINOPHEN 650 MG RE SUPP: 650.0000 mg | RECTAL | Status: DC | PRN

## 2017-09-12 MED ORDER — FLECAINIDE ACETATE 50 MG PO TABS
150.0000 mg | ORAL_TABLET | Freq: Two times a day (BID) | ORAL | Status: DC
  Administered 2017-09-12 – 2017-09-13 (×2): 150 mg via ORAL
  Filled 2017-09-12 (×2): qty 1

## 2017-09-12 MED ORDER — FENTANYL CITRATE (PF) 250 MCG/5ML IJ SOLN
INTRAMUSCULAR | Status: AC
  Filled 2017-09-12: qty 5

## 2017-09-12 MED ORDER — POLYETHYLENE GLYCOL 3350 17 G PO PACK: 17.0000 g | PACK | Freq: Every day | ORAL | Status: DC | PRN

## 2017-09-12 MED ORDER — PROPOFOL 500 MG/50ML IV EMUL
INTRAVENOUS | Status: DC | PRN
  Administered 2017-09-12: 75 ug/kg/min via INTRAVENOUS

## 2017-09-12 MED ORDER — LEVOTHYROXINE SODIUM 75 MCG PO TABS
150.0000 ug | ORAL_TABLET | Freq: Every day | ORAL | Status: DC
  Administered 2017-09-13: 150 ug via ORAL
  Filled 2017-09-12: qty 2

## 2017-09-12 MED ORDER — PHENYLEPHRINE HCL 10 MG/ML IJ SOLN
INTRAMUSCULAR | Status: DC | PRN
  Administered 2017-09-12: 35 ug/min via INTRAVENOUS

## 2017-09-12 MED ORDER — HYDROMORPHONE HCL 1 MG/ML IJ SOLN
INTRAMUSCULAR | Status: AC
  Administered 2017-09-12: 0.5 mg via INTRAVENOUS
  Filled 2017-09-12: qty 1

## 2017-09-12 MED ORDER — ASPIRIN EC 325 MG PO TBEC: 325.0000 mg | DELAYED_RELEASE_TABLET | Freq: Two times a day (BID) | ORAL | 0 refills | Status: DC

## 2017-09-12 MED ORDER — GABAPENTIN 300 MG PO CAPS
300.0000 mg | ORAL_CAPSULE | Freq: Two times a day (BID) | ORAL | Status: DC
  Administered 2017-09-12 – 2017-09-13 (×2): 300 mg via ORAL
  Filled 2017-09-12 (×2): qty 1

## 2017-09-12 MED ORDER — OXYCODONE HCL 5 MG PO TABS
ORAL_TABLET | ORAL | Status: AC
  Administered 2017-09-12: 11:00:00
  Filled 2017-09-12: qty 1

## 2017-09-12 MED ORDER — FLUOXETINE HCL 20 MG PO CAPS
40.0000 mg | ORAL_CAPSULE | Freq: Every day | ORAL | Status: DC
  Administered 2017-09-13: 40 mg via ORAL
  Filled 2017-09-12: qty 2

## 2017-09-12 MED ORDER — ONDANSETRON HCL 4 MG/2ML IJ SOLN
INTRAMUSCULAR | Status: DC | PRN
  Administered 2017-09-12: 4 mg via INTRAVENOUS

## 2017-09-12 MED ORDER — BUPIVACAINE LIPOSOME 1.3 % IJ SUSP
20.0000 mL | INTRAMUSCULAR | Status: AC
  Administered 2017-09-12: 20 mL
  Filled 2017-09-12: qty 20

## 2017-09-12 MED ORDER — CHLORHEXIDINE GLUCONATE 4 % EX LIQD
60.0000 mL | Freq: Once | CUTANEOUS | Status: DC
  Administered 2017-09-12: 4 via TOPICAL

## 2017-09-12 MED ORDER — SODIUM CHLORIDE 0.9 % IV SOLN
INTRAVENOUS | Status: DC
  Administered 2017-09-12 (×2): via INTRAVENOUS

## 2017-09-12 MED ORDER — BUPIVACAINE HCL 0.5 % IJ SOLN
INTRAMUSCULAR | Status: DC | PRN
  Administered 2017-09-12: 20 mL

## 2017-09-12 MED ORDER — TRAZODONE HCL 100 MG PO TABS
200.0000 mg | ORAL_TABLET | Freq: Every day | ORAL | Status: DC
  Administered 2017-09-12: 200 mg via ORAL
  Filled 2017-09-12: qty 2

## 2017-09-12 MED ORDER — BISACODYL 5 MG PO TBEC: 5.0000 mg | DELAYED_RELEASE_TABLET | Freq: Every day | ORAL | Status: DC | PRN

## 2017-09-12 SURGICAL SUPPLY — 57 items
APL SKNCLS STERI-STRIP NONHPOA (GAUZE/BANDAGES/DRESSINGS) ×1
BENZOIN TINCTURE PRP APPL 2/3 (GAUZE/BANDAGES/DRESSINGS) ×2 IMPLANT
BLADE CLIPPER SURG (BLADE) IMPLANT
BNDG COHESIVE 6X5 TAN STRL LF (GAUZE/BANDAGES/DRESSINGS) IMPLANT
BNDG GAUZE ELAST 4 BULKY (GAUZE/BANDAGES/DRESSINGS) IMPLANT
CAPT HIP TOTAL 2 ×1 IMPLANT
CELLS DAT CNTRL 66122 CELL SVR (MISCELLANEOUS) IMPLANT
CLSR STERI-STRIP ANTIMIC 1/2X4 (GAUZE/BANDAGES/DRESSINGS) IMPLANT
COVER PERINEAL POST (MISCELLANEOUS) ×2 IMPLANT
COVER SURGICAL LIGHT HANDLE (MISCELLANEOUS) ×2 IMPLANT
DRAPE C-ARM 42X72 X-RAY (DRAPES) ×2 IMPLANT
DRAPE STERI IOBAN 125X83 (DRAPES) ×2 IMPLANT
DRAPE U-SHAPE 47X51 STRL (DRAPES) ×4 IMPLANT
DRSG AQUACEL AG ADV 3.5X10 (GAUZE/BANDAGES/DRESSINGS) ×2 IMPLANT
DURAPREP 26ML APPLICATOR (WOUND CARE) ×2 IMPLANT
ELECT BLADE 4.0 EZ CLEAN MEGAD (MISCELLANEOUS)
ELECT CAUTERY BLADE 6.4 (BLADE) ×2 IMPLANT
ELECT REM PT RETURN 9FT ADLT (ELECTROSURGICAL) ×2
ELECTRODE BLDE 4.0 EZ CLN MEGD (MISCELLANEOUS) IMPLANT
ELECTRODE REM PT RTRN 9FT ADLT (ELECTROSURGICAL) ×1 IMPLANT
GLOVE BIOGEL PI IND STRL 8 (GLOVE) ×2 IMPLANT
GLOVE BIOGEL PI INDICATOR 8 (GLOVE) ×2
GLOVE ECLIPSE 7.5 STRL STRAW (GLOVE) ×5 IMPLANT
GOWN STRL REUS W/ TWL LRG LVL3 (GOWN DISPOSABLE) ×2 IMPLANT
GOWN STRL REUS W/ TWL XL LVL3 (GOWN DISPOSABLE) ×2 IMPLANT
GOWN STRL REUS W/TWL LRG LVL3 (GOWN DISPOSABLE) ×4
GOWN STRL REUS W/TWL XL LVL3 (GOWN DISPOSABLE) ×4
HOOD PEEL AWAY FACE SHEILD DIS (HOOD) ×4 IMPLANT
KIT BASIN OR (CUSTOM PROCEDURE TRAY) ×2 IMPLANT
KIT ROOM TURNOVER OR (KITS) ×2 IMPLANT
MANIFOLD NEPTUNE II (INSTRUMENTS) ×2 IMPLANT
NDL SPNL 22GX3.5 QUINCKE BK (NEEDLE) ×1 IMPLANT
NEEDLE SPNL 22GX3.5 QUINCKE BK (NEEDLE) ×2 IMPLANT
NS IRRIG 1000ML POUR BTL (IV SOLUTION) ×2 IMPLANT
PACK TOTAL JOINT (CUSTOM PROCEDURE TRAY) ×2 IMPLANT
PACK UNIVERSAL I (CUSTOM PROCEDURE TRAY) ×2 IMPLANT
PAD ARMBOARD 7.5X6 YLW CONV (MISCELLANEOUS) ×4 IMPLANT
RETRACTOR WND ALEXIS 18 MED (MISCELLANEOUS) IMPLANT
RTRCTR WOUND ALEXIS 18CM MED (MISCELLANEOUS)
RTRCTR WOUND ALEXIS 18CM SML (INSTRUMENTS) ×2
SAVER CELL AAL HAEMONETICS (INSTRUMENTS) ×1 IMPLANT
SAW OSC TIP CART 19.5X105X1.3 (SAW) ×2 IMPLANT
SPONGE LAP 18X18 X RAY DECT (DISPOSABLE) IMPLANT
STAPLER VISISTAT 35W (STAPLE) IMPLANT
SUT ETHIBOND NAB CT1 #1 30IN (SUTURE) ×4 IMPLANT
SUT MNCRL AB 3-0 PS2 18 (SUTURE) IMPLANT
SUT VIC AB 0 CT1 27 (SUTURE) ×2
SUT VIC AB 0 CT1 27XBRD ANBCTR (SUTURE) ×1 IMPLANT
SUT VIC AB 1 CT1 27 (SUTURE) ×4
SUT VIC AB 1 CT1 27XBRD ANBCTR (SUTURE) ×2 IMPLANT
SUT VIC AB 2-0 CT1 27 (SUTURE) ×2
SUT VIC AB 2-0 CT1 TAPERPNT 27 (SUTURE) ×1 IMPLANT
SYR 50ML LL SCALE MARK (SYRINGE) ×2 IMPLANT
TOWEL OR 17X24 6PK STRL BLUE (TOWEL DISPOSABLE) ×2 IMPLANT
TOWEL OR 17X26 10 PK STRL BLUE (TOWEL DISPOSABLE) ×2 IMPLANT
TRAY CATH 16FR W/PLASTIC CATH (SET/KITS/TRAYS/PACK) IMPLANT
TRAY FOLEY CATH SILVER 16FR (SET/KITS/TRAYS/PACK) IMPLANT

## 2017-09-12 NOTE — Anesthesia Postprocedure Evaluation (Signed)
Anesthesia Post Note  Patient: Rebecca Marshall  Procedure(s) Performed: TOTAL HIP ARTHROPLASTY ANTERIOR APPROACH (Right Hip)     Patient location during evaluation: PACU Anesthesia Type: Spinal Level of consciousness: oriented and awake and alert Pain management: pain level controlled Vital Signs Assessment: post-procedure vital signs reviewed and stable Respiratory status: spontaneous breathing and respiratory function stable Cardiovascular status: blood pressure returned to baseline and stable Postop Assessment: no headache, no backache and no apparent nausea or vomiting Anesthetic complications: no    Last Vitals:  Vitals:   09/12/17 1042 09/12/17 1045  BP: 109/89   Pulse: 69 68  Resp: 12 15  Temp:    SpO2: 98% 98%    Last Pain:  Vitals:   09/12/17 1043  TempSrc:   PainSc: [place]

## 2017-09-12 NOTE — Discharge Instructions (Signed)

## 2017-09-12 NOTE — Addendum Note (Signed)
Addendum  created 09/12/17 1247 by Glynda Jaeger, CRNA   Charge Capture section accepted, Visit diagnoses modified

## 2017-09-12 NOTE — Evaluation (Signed)
Physical Therapy Evaluation Patient Details Name: Rebecca Marshall MRN: [id number] DOB: 1951/01/29 Today's Date: 09/12/2017   History of Present Illness  Pt is a 66 y/o female s/p elective R THA. PMH includes Depression, OSA on CPAP, fibromyalgia, atrial dysrhythmia, osteoporosis, and L THA.   Clinical Impression  Pt is s/p surgery above with deficits below. PTA, pt was independent with functional mobility. Upon eval, pt presenting with post op pain and weakness, and also shakiness during ambulation. Pt reports shakiness secondary to pain. Required min to min guard assist for mobility using RW. Reports husband will be able to assist at d/c and will need DME below. Follow up recommendations per MD arrangements. Will continue to follow acutely to maximize functional mobility independence and safety.     Follow Up Recommendations DC plan and follow up therapy as arranged by surgeon;Supervision for mobility/OOB    Equipment Recommendations  Rolling walker with 5" wheels;3in1 (PT)    Recommendations for Other Services       Precautions / Restrictions Precautions Precautions: None Precaution Comments: Reviewed supine ther ex with pt.  Restrictions Weight Bearing Restrictions: Yes RLE Weight Bearing: Weight bearing as tolerated      Mobility  Bed Mobility Overal bed mobility: Needs Assistance Bed Mobility: Supine to Sit     Supine to sit: Supervision     General bed mobility comments: Supervision for safety. Use of bed rail and elevated HOB.   Transfers Overall transfer level: Needs assistance Equipment used: Rolling walker (2 wheeled) Transfers: Sit to/from Stand Sit to Stand: Min assist         General transfer comment: Min A for lift assist and steadying assist. Demonstrated safe hand placement.   Ambulation/Gait Ambulation/Gait assistance: Min assist;Min guard Ambulation Distance (Feet): 50 Feet Assistive device: Rolling walker (2 wheeled) Gait Pattern/deviations:  Step-to pattern;Step-through pattern;Decreased step length - left;Decreased step length - right;Decreased weight shift to right Gait velocity: Decreased Gait velocity interpretation: Below normal speed for age/gender General Gait Details: Slow, antalgic gait. Very shaky throughout ambulation secondary to pain, and required min to min guard assist for steadying. Verbal cues for sequencing using RW. Also required safety cues for safe speed as pt was increasing speed to get to chair quickly secondary to pain.   Stairs            Wheelchair Mobility    Modified Rankin (Stroke Patients Only)       Balance Overall balance assessment: Needs assistance Sitting-balance support: No upper extremity supported;Feet supported Sitting balance-Leahy Scale: Good     Standing balance support: Bilateral upper extremity supported;During functional activity Standing balance-Leahy Scale: Poor Standing balance comment: Reliant on BUE support for stability                              Pertinent Vitals/Pain Pain Assessment: 0-10 Pain Score: 10-Worst pain ever Pain Location: R hip with ambulation Pain Descriptors / Indicators: Aching;Operative site guarding Pain Intervention(s): Limited activity within patient's tolerance;Monitored during session;Repositioned;Patient requesting pain meds-RN notified    Home Living Family/patient expects to be discharged to:: Private residence Living Arrangements: Spouse/significant other Available Help at Discharge: Family;Available 24 hours/day Type of Home: House Home Access: Stairs to enter Entrance Stairs-Rails: None Entrance Stairs-Number of Steps: 1 Home Layout: One level Home Equipment: None      Prior Function Level of Independence: Independent               Hand Dominance  Dominant Hand: Right    Extremity/Trunk Assessment   Upper Extremity Assessment Upper Extremity Assessment: Overall WFL for tasks assessed    Lower  Extremity Assessment Lower Extremity Assessment: RLE deficits/detail RLE Deficits / Details: Sensory in tact. Deficits consistent with post op pain and weakness. Able to perform ther ex below.     Cervical / Trunk Assessment Cervical / Trunk Assessment: Normal  Communication   Communication: No difficulties  Cognition Arousal/Alertness: Awake/alert Behavior During Therapy: WFL for tasks assessed/performed Overall Cognitive Status: Within Functional Limits for tasks assessed                                        General Comments      Exercises Total Joint Exercises Ankle Circles/Pumps: AROM;Both;20 reps Quad Sets: AROM;Both;10 reps;Supine Heel Slides: AROM;Right;10 reps;Supine Hip ABduction/ADduction: AROM;Right;10 reps;Supine   Assessment/Plan    PT Assessment Patient needs continued PT services  PT Problem List Decreased strength;Decreased activity tolerance;Decreased range of motion;Decreased balance;Decreased mobility;Decreased knowledge of use of DME;Pain       PT Treatment Interventions DME instruction;Gait training;Stair training;Therapeutic exercise;Functional mobility training;Therapeutic activities;Balance training;Neuromuscular re-education;Patient/family education    PT Goals (Current goals can be found in the Care Plan section)  Acute Rehab PT Goals Patient Stated Goal: to go home tomorrow afternoon.  PT Goal Formulation: With patient Time For Goal Achievement: 09/19/17 Potential to Achieve Goals: Good    Frequency 7X/week   Barriers to discharge        Co-evaluation               AM-PAC PT "6 Clicks" Daily Activity  Outcome Measure Difficulty turning over in bed (including adjusting bedclothes, sheets and blankets)?: A Little Difficulty moving from lying on back to sitting on the side of the bed? : A Little Difficulty sitting down on and standing up from a chair with arms (e.g., wheelchair, bedside commode, etc,.)?: Unable Help  needed moving to and from a bed to chair (including a wheelchair)?: A Little Help needed walking in hospital room?: A Little Help needed climbing 3-5 steps with a railing? : A Lot 6 Click Score: 15    End of Session Equipment Utilized During Treatment: Gait belt Activity Tolerance: Patient tolerated treatment well Patient left: in chair;with call bell/phone within reach Nurse Communication: Mobility status;Patient requests pain meds PT Visit Diagnosis: Other abnormalities of gait and mobility (R26.89);Unsteadiness on feet (R26.81);Pain Pain - Right/Left: Right Pain - part of body: Hip    Time: 0037-0488 PT Time Calculation (min) (ACUTE ONLY): 28 min   Charges:   PT Evaluation $PT Eval Low Complexity: 1 Low PT Treatments $Gait Training: 8-22 mins   PT G Codes:        Leighton Ruff, PT, DPT  Acute Rehabilitation Services  Pager: [phone]   Rudean Hitt 09/12/2017, 5:52 PM

## 2017-09-12 NOTE — Transfer of Care (Signed)
Immediate Anesthesia Transfer of Care Note  Patient: Rebecca Marshall  Procedure(s) Performed: TOTAL HIP ARTHROPLASTY ANTERIOR APPROACH (Right Hip)  Patient Location: PACU  Anesthesia Type:MAC and Spinal  Level of Consciousness: awake, alert , oriented, patient cooperative and responds to stimulation  Airway & Oxygen Therapy: Patient Spontanous Breathing and Patient connected to face mask oxygen  Post-op Assessment: Report given to RN and Post -op Vital signs reviewed and stable  Post vital signs: Reviewed and stable  Last Vitals:  Vitals:   09/12/17 0547  BP: 113/67  Pulse: 62  Resp: 18  Temp: 36.9 C  SpO2: 95%    Last Pain:  Vitals:   09/12/17 0613  TempSrc:   PainSc: 4       Patients Stated Pain Goal: 4 (81/38/87 1959)  Complications: No apparent anesthesia complications

## 2017-09-12 NOTE — Progress Notes (Signed)
REPORT GIVEN TO LY RN AS CAREGIVER

## 2017-09-12 NOTE — Anesthesia Preprocedure Evaluation (Signed)
Anesthesia Evaluation  Patient identified by MRN, date of birth, ID band Patient awake    Reviewed: Allergy & Precautions, NPO status , Patient's Chart, lab work & pertinent test results  History of Anesthesia Complications (+) PONV and history of anesthetic complications  Airway Mallampati: II  TM Distance: >3 FB Neck ROM: Full    Dental no notable dental hx.    Pulmonary neg pulmonary ROS, sleep apnea and Continuous Positive Airway Pressure Ventilation ,    Pulmonary exam normal breath sounds clear to auscultation       Cardiovascular negative cardio ROS Normal cardiovascular exam+ dysrhythmias  Rhythm:Regular Rate:Normal     Neuro/Psych  Headaches, PSYCHIATRIC DISORDERS Anxiety Depression negative neurological ROS  negative psych ROS   GI/Hepatic negative GI ROS, Neg liver ROS, GERD  Medicated,  Endo/Other  negative endocrine ROSHypothyroidism Morbid obesity  Renal/GU negative Renal ROS  negative genitourinary   Musculoskeletal negative musculoskeletal ROS (+) Arthritis , Fibromyalgia -  Abdominal   Peds negative pediatric ROS (+)  Hematology negative hematology ROS (+)   Anesthesia Other Findings Echo 3/17 Impressions:  - Normal LV size with mild LV hypertrophy. EF 60-65%. Moderate   diastolic dysfunction. Normal RV size and systolic function. No   significant valvular abnormalities.  Reproductive/Obstetrics negative OB ROS                             Anesthesia Physical  Anesthesia Plan  ASA: III  Anesthesia Plan: Spinal   Post-op Pain Management:    Induction: Intravenous  PONV Risk Score and Plan: 3 and Ondansetron, Dexamethasone, Treatment may vary due to age or medical condition and Midazolam  Airway Management Planned: Simple Face Mask  Additional Equipment:   Intra-op Plan:   Post-operative Plan:   Informed Consent: I have reviewed the patients History and  Physical, chart, labs and discussed the procedure including the risks, benefits and alternatives for the proposed anesthesia with the patient or authorized representative who has indicated his/her understanding and acceptance.   Dental advisory given  Plan Discussed with: CRNA  Anesthesia Plan Comments: (  )        Anesthesia Quick Evaluation

## 2017-09-12 NOTE — Anesthesia Procedure Notes (Signed)
Procedure Name: MAC Date/Time: 09/12/2017 7:28 AM Performed by: Glynda Jaeger, CRNA Pre-anesthesia Checklist: Patient identified, Emergency Drugs available, Suction available, Timeout performed and Patient being monitored Patient Re-evaluated:Patient Re-evaluated prior to induction Oxygen Delivery Method: Simple face mask Placement Confirmation: positive ETCO2

## 2017-09-12 NOTE — Progress Notes (Signed)
Orthopedic Tech Progress Note Patient Details:  Rebecca Marshall [birth year] [id number]  Ortho Devices Ortho Device/Splint Location: Trapeze bar Ortho Device/Splint Interventions: Application   Post Interventions Patient Tolerated: Well Instructions Provided: Care of device, Adjustment of device   Rebecca Marshall 09/12/2017, 1:29 PM

## 2017-09-12 NOTE — Anesthesia Procedure Notes (Signed)
Spinal  Patient location during procedure: OR Start time: 09/12/2017 7:30 AM End time: 09/12/2017 7:35 AM Staffing Anesthesiologist: Lynda Rainwater, MD Performed: anesthesiologist  Preanesthetic Checklist Completed: patient identified, site marked, surgical consent, pre-op evaluation, timeout performed, IV checked, risks and benefits discussed and monitors and equipment checked Spinal Block Patient position: sitting Prep: Betadine Patient monitoring: heart rate, cardiac monitor, continuous pulse ox and blood pressure Approach: midline Location: L3-4 Injection technique: single-shot Needle Needle type: [place]  Needle gauge: 22 G Needle length: 9 cm

## 2017-09-12 NOTE — H&P (Signed)
TOTAL HIP ADMISSION H&P  Patient is admitted for right total hip arthroplasty.  Subjective:  Chief Complaint: right hip pain  HPI: Rebecca Marshall, 66 y.o. female, has a history of pain and functional disability in the right hip(s) due to arthritis and patient has failed non-surgical conservative treatments for greater than 12 weeks to include NSAID's and/or analgesics, use of assistive devices and activity modification.  Onset of symptoms was gradual starting 4 years ago with gradually worsening course since that time.The patient noted no past surgery on the right hip(s).  Patient currently rates pain in the right hip at 9 out of 10 with activity. Patient has night pain, worsening of pain with activity and weight bearing, trendelenberg gait, pain that interfers with activities of daily living, pain with passive range of motion, crepitus and joint swelling. Patient has evidence of subchondral sclerosis and joint space narrowing by imaging studies. This condition presents safety issues increasing the risk of falls. This patient has had Failure of all reasonable conservative care.  There is no current active infection.  Patient Active Problem List   Diagnosis Date Noted  . Status post Nissen fundoplication 01/77/9390  . Poor compliance with CPAP treatment 03/24/2017  . Depression headache 03/06/2016  . Clinical depression 03/06/2016  . Rash and nonspecific skin eruption 01/09/2016  . Bilateral knee pain 06/29/2015  . Hip pain, chronic 04/05/2015  . OSA on CPAP 01/30/2015  . Snorings 01/30/2015  . Severe obesity (BMI >= 40) ([place]) 01/30/2015  . Nocturia more than twice per night 01/30/2015  . Hypoxemia 01/30/2015  . OSA (obstructive sleep apnea) 11/01/2014  . Cognitive changes 10/12/2014  . Memory change 05/12/2014  . Family history of diabetes mellitus (DM) 05/11/2014  . Herniated lumbar disc without myelopathy 12/10/2013  . AK (actinic keratosis) 04/16/2012  . Atrial tachycardia ([place])  06/05/2011  . Bilateral bunions 12/18/2010  . Metatarsalgia 12/18/2010  . ANXIETY DEPRESSION 09/03/2010  . ADENOCARCINOMA, THYROID GLAND, PAPILLARY 05/17/2009  . ALLERGIC RHINITIS 01/30/2009  . 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 08/11/2007  . GERD 07/14/2007  . FIBROMYALGIA 07/14/2007   Past Medical History:  Diagnosis Date  . Acne nodule 11/26/2011  . ADENOCARCINOMA, THYROID GLAND, PAPILLARY 05/17/2009   04/2009 Two surgeries for thyroid cancer Path report from last surgery :THYROID, LEFT, HEMITHYROIDECTOMY: - MULTIPLE FOCI OF FOLLICULAR VARIANT OR PAPILLARY THYROID CARCINOMA, 1.7 CM IN GREATEST DIMENSION, CONFINED WITHIN THE THYROID TISSUE. - ANGIOLYMPHATIC INVASION PRESENT. - ISTHMUS RESECTION MARGIN INVOLVED BY TUMOR. - FOLLICULAR ADENOMA(S) WITH ASSOCIATED LYMPHOCYTIC THYROIDITIS.     . AK (actinic keratosis) 04/16/2012  . ALLERGIC RHINITIS 01/30/2009   Qualifier: Diagnosis of  By: Andria Frames MD, Gwyndolyn Saxon    . Anxiety   . ANXIETY DEPRESSION 09/03/2010   Long standing depression with anxiety. Good response to medications initially. Has been to [facility].    Marland Kitchen Arthritis   . 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 08/11/2007   LEFT ZES:PQZRA hip replacement (Dr Berenice Primas) with recalled hardware. Had revision by Dr Berenice Primas in 2011 with  ASR .Marland Kitchen    Marland Kitchen Atrial dysrhythmia    FOLLOWED BY DR Caryl Comes WAS HAVING PALPITATIONS AND SYNCOPAL EPISODES ONSET 9 YEARS AGO ; STARTED ON FLECAIINIDE , PER PATIENT TOLERATES WELL, LAST SYNCOPAL EPISODE WAS OVER 5 YEARS AGO ; SEE LOV IN  EPIC   . Atrial tachycardia ([place]) 06/05/2011   \Treated with flecainide-2011 2010-QRS duration 88 ms 2011 QRS duration 88 ms 2014 QRS duration 98 ms 2014 QRS 175m  120 ms    . Bilateral bunions 12/18/2010  . Bilateral knee pain 06/29/2015  . Cancer [facility])    thyroid-status post resection on replacement Dr. Chalmers Cater  . Clinical depression 03/06/2016  . Cognitive changes 10/12/2014  . Complication of anesthesia    FENTANYL CAUSED  ANAPHYLAXIS DURING CA COLONOSCOPY  . Depression   . Dysrhythmia    Dr Caryl Comes monitoring patient, atrial tachycardia under control, cardiac clearance 09/10/17  . Family history of diabetes mellitus (DM) 05/11/2014  . Fibromyalgia   . Gait abnormality 12/18/2010  . GE reflux   . GERD 07/14/2007   Qualifier: Diagnosis of  By: Nori Riis MD, Clarise Cruz    . Headache    migraines  . Headache upon awakening 06/05/2011  . Herniated lumbar disc without myelopathy 12/10/2013   Had surgery by neurosurgery in December, 2014.L4-5 laminotomy and microdiscectomy Dr Margreta Journey Done at [facility]   . Hip pain, chronic 04/05/2015   Status post THR plus revision.   . Hypoxemia 01/30/2015  . Memory change 05/12/2014   Reports problems with short-term memory, focusing, concentration in the last 6-12 months. Problems were significant enough that she had to quit her part-time job is fairly distressed about this.   . Metatarsalgia 12/18/2010  . Nausea & vomiting 06/05/2011  . Nocturia more than twice per night 01/30/2015  . Nonallopathic lesion of cervical region 11/26/2011  . Nonallopathic lesion of lumbosacral region 11/26/2011  . OSA on CPAP 01/30/2015  . Osteoporosis   . PONV (postoperative nausea and vomiting)    did not have nausea/vomiting with nissen fundoplication done 74/8270 at [facility]  . Poor compliance with CPAP treatment 03/24/2017  . Rash and nonspecific skin eruption 01/09/2016    located in the 10 o'clock position on Right breast    . Severe obesity (BMI >= 40) ([place]) 01/30/2015  . Sleep apnea 10/2015   x 1 year, CPAP  . Snorings 01/30/2015  . Status post Nissen fundoplication 78/02/7543  . Subacute cough 03/27/2012  . Thyroid disease   . Well adult health check 06/05/2011    Past Surgical History:  Procedure Laterality Date  . ABDOMINAL HYSTERECTOMY    . BREAST EXCISIONAL BIOPSY Right 25+ yrs ago   benign  . caroitid dopplers Bilateral 11/2015   normal  . CATARACT EXTRACTION Bilateral 09/05/15,  09/12/15  . CHOLECYSTECTOMY    . COLONOSCOPY  04/2011  . ESOPHAGEAL MANOMETRY N/A 03/31/2017   Procedure: ESOPHAGEAL MANOMETRY (EM);  Surgeon: Ronald Lobo, MD;  Location: [facility] ENDOSCOPY;  Service: Endoscopy;  Laterality: N/A;  . LAPAROSCOPIC NISSEN FUNDOPLICATION N/A 92/0/1007   Procedure: LAPAROSCOPIC NISSEN FUNDOPLICATION;  Surgeon: Johnathan Hausen, MD;  Location: [facility] ORS;  Service: General;  Laterality: N/A;  . THYROIDECTOMY  04/2009   papillary carcimao Dr. Erik Obey  . TOE SURGERY     bone spur removed - rt great toe  . TOTAL HIP ARTHROPLASTY  2009 and 2011   L hip   . TUBAL LIGATION      Current Facility-Administered Medications  Medication Dose Route Frequency Provider Last Rate Last Dose  . bupivacaine liposome (EXPAREL) 1.3 % injection 266 mg  20 mL Infiltration To OR Jarad Barth, MD      . ceFAZolin (ANCEF) IVPB 2g/100 mL premix  2 g Intravenous On Call to OR Dorna Leitz, MD      . chlorhexidine (HIBICLENS) 4 % liquid 4 application  60 mL Topical Once Dorna Leitz, MD      . tranexamic acid (CYKLOKAPRON)  1,000 mg in sodium chloride 0.9 % 100 mL IVPB  1,000 mg Intravenous To OR Dorna Leitz, MD       Facility-Administered Medications Ordered in Other Encounters  Medication Dose Route Frequency Provider Last Rate Last Dose  . lactated ringers infusion    Continuous PRN Holtzman, Ariel Leffew, CRNA       Allergies  Allergen Reactions  . Nsaids Other (See Comments)    Rectal bleeding  . Codeine Nausea And Vomiting  . Fentanyl Other (See Comments)    "knocked out" previously with a high dose.  Can tolerate low doses.     Social History   Tobacco Use  . Smoking status: Never Smoker  . Smokeless tobacco: Never Used  Substance Use Topics  . Alcohol use: No    Alcohol/week: 0.0 oz    Comment: none    Family History  Problem Relation Age of Onset  . Cancer Other   . Alcohol abuse Mother   . Cancer Mother 19       Lung   . Alcohol abuse Father   . Cancer Sister 26        Breast     ROS ROS: I have reviewed the patient's review of systems thoroughly and there are no positive responses as relates to the HPI.Objective:  Physical Exam  Vital signs in last 24 hours: Temp:  [98.5 F (36.9 C)] 98.5 F (36.9 C) (12/21 0547) Pulse Rate:  [62] 62 (12/21 0547) Resp:  [18] 18 (12/21 0547) BP: (113)/(67) 113/67 (12/21 0547) SpO2:  [95 %] 95 % (12/21 0547) Well-developed well-nourished patient in no acute distress. Alert and oriented x3 HEENT:within normal limits Cardiac: Regular rate and rhythm Pulmonary: Lungs clear to auscultation Abdomen: Soft and nontender.  Normal active bowel sounds  Musculoskeletal: (Right hip: Limited range of motion.  Painful range of motion.  Limited internal rotation.  Neurovascularly intact distally. Labs:  Recent Results (from the past 2160 hour(s))  Basic metabolic panel     Status: Abnormal   Collection Time: 06/17/17  1:46 PM  Result Value Ref Range   Sodium 142 135 - 145 mmol/L   Potassium 3.9 3.5 - 5.1 mmol/L   Chloride 108 101 - 111 mmol/L   CO2 26 22 - 32 mmol/L   Glucose, Bld 103 (H) 65 - 99 mg/dL   BUN 17 6 - 20 mg/dL   Creatinine, Ser 0.91 0.44 - 1.00 mg/dL   Calcium 8.7 (L) 8.9 - 10.3 mg/dL   GFR calc non Af Amer >60 >60 mL/min   GFR calc Af Amer >60 >60 mL/min    Comment: (NOTE) The eGFR has been calculated using the CKD EPI equation. This calculation has not been validated in all clinical situations. eGFR's persistently <60 mL/min signify possible Chronic Kidney Disease.    Anion gap 8 5 - 15  CBC     Status: None   Collection Time: 06/17/17  1:46 PM  Result Value Ref Range   WBC 5.4 4.0 - 10.5 K/uL   RBC 4.28 3.87 - 5.11 MIL/uL   Hemoglobin 12.8 12.0 - 15.0 g/dL   HCT 38.9 36.0 - 46.0 %   MCV 90.9 78.0 - 100.0 fL   MCH 29.9 26.0 - 34.0 pg   MCHC 32.9 30.0 - 36.0 g/dL   RDW 14.2 11.5 - 15.5 %   Platelets 235 150 - 400 K/uL  CBC     Status: Abnormal   Collection Time: 06/26/17 12:12 PM  Result  Value Ref Range   WBC 10.9 (H) 4.0 - 10.5 K/uL   RBC 4.49 3.87 - 5.11 MIL/uL   Hemoglobin 14.0 12.0 - 15.0 g/dL   HCT 40.8 36.0 - 46.0 %   MCV 90.9 78.0 - 100.0 fL   MCH 31.2 26.0 - 34.0 pg   MCHC 34.3 30.0 - 36.0 g/dL   RDW 14.2 11.5 - 15.5 %   Platelets 208 150 - 400 K/uL  Creatinine, serum     Status: None   Collection Time: 06/26/17 12:12 PM  Result Value Ref Range   Creatinine, Ser 0.73 0.44 - 1.00 mg/dL   GFR calc non Af Amer >60 >60 mL/min   GFR calc Af Amer >60 >60 mL/min    Comment: (NOTE) The eGFR has been calculated using the CKD EPI equation. This calculation has not been validated in all clinical situations. eGFR's persistently <60 mL/min signify possible Chronic Kidney Disease.   Urinalysis, Routine w reflex microscopic     Status: Abnormal   Collection Time: 09/10/17  2:55 PM  Result Value Ref Range   Color, Urine YELLOW YELLOW   APPearance CLEAR CLEAR   Specific Gravity, Urine 1.021 1.005 - 1.030   pH 6.0 5.0 - 8.0   Glucose, UA NEGATIVE NEGATIVE mg/dL   Hgb urine dipstick SMALL (A) NEGATIVE   Bilirubin Urine NEGATIVE NEGATIVE   Ketones, ur NEGATIVE NEGATIVE mg/dL   Protein, ur NEGATIVE NEGATIVE mg/dL   Nitrite NEGATIVE NEGATIVE   Leukocytes, UA NEGATIVE NEGATIVE   RBC / HPF 0-5 0 - 5 RBC/hpf   WBC, UA 0-5 0 - 5 WBC/hpf   Bacteria, UA RARE (A) NONE SEEN   Squamous Epithelial / LPF 0-5 (A) NONE SEEN   Mucus PRESENT   Surgical pcr screen     Status: None   Collection Time: 09/10/17  2:56 PM  Result Value Ref Range   MRSA, PCR NEGATIVE NEGATIVE   Staphylococcus aureus NEGATIVE NEGATIVE    Comment: (NOTE) The Xpert SA Assay (FDA approved for NASAL specimens in patients 24 years of age and older), is one component of a comprehensive surveillance program. It is not intended to diagnose infection nor to guide or monitor treatment.   APTT     Status: None   Collection Time: 09/10/17  3:00 PM  Result Value Ref Range   aPTT 26 24 - 36 seconds  CBC WITH  DIFFERENTIAL     Status: Abnormal   Collection Time: 09/10/17  3:00 PM  Result Value Ref Range   WBC 5.2 4.0 - 10.5 K/uL   RBC 3.97 3.87 - 5.11 MIL/uL   Hemoglobin 11.9 (L) 12.0 - 15.0 g/dL   HCT 36.9 36.0 - 46.0 %   MCV 92.9 78.0 - 100.0 fL   MCH 30.0 26.0 - 34.0 pg   MCHC 32.2 30.0 - 36.0 g/dL   RDW 14.0 11.5 - 15.5 %   Platelets 261 150 - 400 K/uL   Neutrophils Relative % 58 %   Neutro Abs 3.0 1.7 - 7.7 K/uL   Lymphocytes Relative 30 %   Lymphs Abs 1.6 0.7 - 4.0 K/uL   Monocytes Relative 11 %   Monocytes Absolute 0.6 0.1 - 1.0 K/uL   Eosinophils Relative 1 %   Eosinophils Absolute 0.1 0.0 - 0.7 K/uL   Basophils Relative 0 %   Basophils Absolute 0.0 0.0 - 0.1 K/uL  Comprehensive metabolic panel     Status: Abnormal   Collection Time: 09/10/17  3:00 PM  Result Value Ref Range   Sodium 139 135 - 145 mmol/L   Potassium 3.6 3.5 - 5.1 mmol/L    Comment: SLIGHT HEMOLYSIS   Chloride 108 101 - 111 mmol/L   CO2 24 22 - 32 mmol/L   Glucose, Bld 95 65 - 99 mg/dL   BUN 17 6 - 20 mg/dL   Creatinine, Ser 0.78 0.44 - 1.00 mg/dL   Calcium 8.4 (L) 8.9 - 10.3 mg/dL   Total Protein 6.6 6.5 - 8.1 g/dL   Albumin 3.8 3.5 - 5.0 g/dL   AST 25 15 - 41 U/L   ALT 13 (L) 14 - 54 U/L   Alkaline Phosphatase 79 38 - 126 U/L   Total Bilirubin 0.5 0.3 - 1.2 mg/dL   GFR calc non Af Amer >60 >60 mL/min   GFR calc Af Amer >60 >60 mL/min    Comment: (NOTE) The eGFR has been calculated using the CKD EPI equation. This calculation has not been validated in all clinical situations. eGFR's persistently <60 mL/min signify possible Chronic Kidney Disease.    Anion gap 7 5 - 15  Protime-INR     Status: None   Collection Time: 09/10/17  3:00 PM  Result Value Ref Range   Prothrombin Time 13.2 11.4 - 15.2 seconds   INR 1.01   Type and screen Order type and screen if day of surgery is less than 15 days from draw of preadmission visit or order morning of surgery if day of surgery is greater than 6 days from  preadmission visit.     Status: None   Collection Time: 09/10/17  3:02 PM  Result Value Ref Range   ABO/RH(D) O POS    Antibody Screen POS    Sample Expiration 09/12/2017    Antibody Identification ANTI e    Unit Number [id number]    Blood Component Type RED CELLS,LR    Unit division 00    Status of Unit REL FROM [facility]    Donor AG Type NEGATIVE FOR e ANTIGEN NEGATIVE FOR C ANTIGEN    Transfusion Status OK TO TRANSFUSE    Crossmatch Result COMPATIBLE    Unit Number [id number]    Blood Component Type RED CELLS,LR    Unit division 00    Status of Unit REL FROM [facility]    Donor AG Type NEGATIVE FOR e ANTIGEN NEGATIVE FOR C ANTIGEN    Transfusion Status OK TO TRANSFUSE    Crossmatch Result COMPATIBLE   BPAM RBC     Status: None   Collection Time: 09/10/17  3:02 PM  Result Value Ref Range   Blood Product Unit Number [id number]    Unit Type and Rh 5100    Blood Product Expiration Date [id number]    Blood Product Unit Number [id number]    Unit Type and Rh 5100    Blood Product Expiration Date [id number]   Type and screen [place]     Status: None (Preliminary result)   Collection Time: 09/12/17  6:15 AM  Result Value Ref Range   ABO/RH(D) O POS    Antibody Screen POS    Sample Expiration 09/15/2017    Antibody Identification PENDING    Estimated body mass index is 32.99 kg/m as calculated from the following:   Height as of 09/10/17: 5' 8"  (1.727 m).   Weight as of 09/10/17: 98.4 kg (217 lb).   Imaging Review Plain radiographs demonstrate severe degenerative joint disease of the right hip(s). The bone quality appears  to be good for age and reported activity level.  Assessment/Plan:  End stage arthritis, right hip(s)  The patient history, physical examination, clinical judgement of the provider and imaging studies are consistent with end stage degenerative joint disease of the right hip(s) and total hip arthroplasty is deemed medically  necessary. The treatment options including medical management, injection therapy, arthroscopy and arthroplasty were discussed at length. The risks and benefits of total hip arthroplasty were presented and reviewed. The risks due to aseptic loosening, infection, stiffness, dislocation/subluxation,  thromboembolic complications and other imponderables were discussed.  The patient acknowledged the explanation, agreed to proceed with the plan and consent was signed. Patient is being admitted for inpatient treatment for surgery, pain control, PT, OT, prophylactic antibiotics, VTE prophylaxis, progressive ambulation and ADL's and discharge planning.The patient is planning to be discharged home with home health services

## 2017-09-13 LAB — CBC
HCT: 31.8 % — ABNORMAL LOW (ref 36.0–46.0)
Hemoglobin: 10 g/dL — ABNORMAL LOW (ref 12.0–15.0)
MCH: 29.5 pg (ref 26.0–34.0)
MCHC: 31.4 g/dL (ref 30.0–36.0)
MCV: 93.8 fL (ref 78.0–100.0)
Platelets: 220 10*3/uL (ref 150–400)
RBC: 3.39 MIL/uL — ABNORMAL LOW (ref 3.87–5.11)
RDW: 13.9 % (ref 11.5–15.5)
WBC: 8.7 10*3/uL (ref 4.0–10.5)

## 2017-09-13 NOTE — Care Management Note (Signed)
Case Management Note  Patient Details  Name: Rebecca Marshall MRN: [id number] Date of Birth: 04/23/1951  Subjective/Objective:                 Spoke w patient and the husband at bedside, they state that MD office arranged to have [facility] PT 3 times a week for three weeks through [facility]. Patient states that she needs her DME, notified [facility] that patient will need 3/1 and RW delivered to room prior to DC. Also notified [facility] of [place] and DC today. No other CM needs   Action/Plan:   Expected Discharge Date:  09/13/17               Expected Discharge Plan:  [place]  In-House Referral:     Discharge planning Services  CM Consult  Post Acute Care Choice:  Home Health Choice offered to:  Patient  DME Arranged:  3-N-1, Walker rolling DME Agency:  [place]:  PT [facility] Agency:  [place]  Status of Service:     If discussed at [place] of Stay Meetings, dates discussed:    Additional Comments:  Carles Collet, RN 09/13/2017, 10:33 AM

## 2017-09-13 NOTE — Progress Notes (Signed)
Physical Therapy Treatment and Discharge Patient Details Name: Rebecca Marshall MRN: [id number] DOB: July 11, 1951 Today's Date: 09/13/2017    History of Present Illness Pt is a 66 y/o female s/p elective R THA. PMH includes Depression, OSA on CPAP, fibromyalgia, atrial dysrhythmia, osteoporosis, and L THA.     PT Comments    Patient found ambulating independently in hallway. Session focused on improving gait mechanics, stair training,  ensuring patient understood therex. Currently mod I with mobility and will have husband for supervision at home. Pt educated on safety considerations for home and have no further questions or concerns at this time. Pt has met all functional goals and will benefit from skilled home health PT when medically cleared for d/c.     Follow Up Recommendations  DC plan and follow up therapy as arranged by surgeon;Supervision for mobility/OOB     Equipment Recommendations  Rolling walker with 5" wheels;3in1 (PT)    Recommendations for Other Services       Precautions / Restrictions Precautions Precautions: None Precaution Comments: Reviewed supine ther ex with pt.  Restrictions Weight Bearing Restrictions: Yes RLE Weight Bearing: Weight bearing as tolerated    Mobility  Bed Mobility Overal bed mobility: Modified Independent Bed Mobility: Supine to Sit     Supine to sit: Modified independent (Device/Increase time)     General bed mobility comments: utilizing sheet for RLE into bed.  Transfers Overall transfer level: Modified independent Equipment used: Rolling walker (2 wheeled) Transfers: Sit to/from Stand Sit to Stand: Modified independent (Device/Increase time)         General transfer comment: mod I for all mobility, found in hallway ambulating   Ambulation/Gait Ambulation/Gait assistance: Supervision Ambulation Distance (Feet): 300 Feet Assistive device: Rolling walker (2 wheeled) Gait Pattern/deviations: Step-through  pattern;Antalgic Gait velocity: decreased   General Gait Details: cues for heel strike and equal step length. patient able to demo consistant step through gait pattern with supervision.   Stairs Stairs: Yes   Stair Management: No rails;Forwards;With walker Number of Stairs: 4 General stair comments: 4x1 platform for home setup. progresed to supervision. education and cues on sequencing and RW safety as patient was leaning too far away at first.  Wheelchair Mobility    Modified Rankin (Stroke Patients Only)       Balance Overall balance assessment: Needs assistance Sitting-balance support: No upper extremity supported;Feet supported Sitting balance-Leahy Scale: Good     Standing balance support: Bilateral upper extremity supported;During functional activity Standing balance-Leahy Scale: Poor Standing balance comment: Reliant on BUE support for stability                             Cognition Arousal/Alertness: Awake/alert Behavior During Therapy: WFL for tasks assessed/performed Overall Cognitive Status: Within Functional Limits for tasks assessed                                        Exercises      General Comments        Pertinent Vitals/Pain Pain Assessment: Faces Faces Pain Scale: Hurts little more Pain Location: R hip with ambulation Pain Descriptors / Indicators: Aching;Operative site guarding Pain Intervention(s): Limited activity within patient's tolerance;Monitored during session    Home Living                      Prior Function  PT Goals (current goals can now be found in the care plan section) Acute Rehab PT Goals Patient Stated Goal: to go home asap PT Goal Formulation: With patient Time For Goal Achievement: 09/19/17 Potential to Achieve Goals: Good Progress towards PT goals: Goals met/education completed, patient discharged from PT    Frequency           PT Plan Current plan remains  appropriate    Co-evaluation              AM-PAC PT "6 Clicks" Daily Activity  Outcome Measure  Difficulty turning over in bed (including adjusting bedclothes, sheets and blankets)?: A Little Difficulty moving from lying on back to sitting on the side of the bed? : A Little Difficulty sitting down on and standing up from a chair with arms (e.g., wheelchair, bedside commode, etc,.)?: A Little Help needed moving to and from a bed to chair (including a wheelchair)?: A Little Help needed walking in hospital room?: A Little Help needed climbing 3-5 steps with a railing? : A Little 6 Click Score: 18    End of Session Equipment Utilized During Treatment: Gait belt Activity Tolerance: Patient tolerated treatment well Patient left: in bed Nurse Communication: Mobility status;Patient requests pain meds PT Visit Diagnosis: Other abnormalities of gait and mobility (R26.89);Unsteadiness on feet (R26.81);Pain Pain - Right/Left: Right Pain - part of body: Hip     Time: 2409-7353 PT Time Calculation (min) (ACUTE ONLY): 26 min  Charges:  $Gait Training: 8-22 mins                    G Codes:      Reinaldo Berber, PT, DPT Acute Rehab Services Pager: [phone]     Reinaldo Berber 09/13/2017, 10:28 AM

## 2017-09-13 NOTE — Progress Notes (Signed)
Pt discharge education and instructions completed with pt and spouse at bedside; both voices understanding and denies any questions. Pt IV removed; pt handed her prescriptions for aspirin, colace, dilaudid and flexeril. Pt home DME walker and 3 in 1 delivered to pt at bedside. Pt discharge home with spouse to transport her home. Pt transported off unit via wheelchair with belongings and spouse at the side. Delia Heady RN

## 2017-09-13 NOTE — Discharge Summary (Signed)
Physician Discharge Summary  Patient ID: Rebecca Marshall MRN: [id number] DOB/AGE: [birth year] 66 y.o.  Admit date: 09/12/2017 Discharge date: 09/13/2017  Admission Diagnoses:  Primary osteoarthritis of right hip  Discharge Diagnoses:  Principal Problem:   Primary osteoarthritis of right hip   Past Medical History:  Diagnosis Date  . Acne nodule 11/26/2011  . ADENOCARCINOMA, THYROID GLAND, PAPILLARY 05/17/2009   04/2009 Two surgeries for thyroid cancer Path report from last surgery :THYROID, LEFT, HEMITHYROIDECTOMY: - MULTIPLE FOCI OF FOLLICULAR VARIANT OR PAPILLARY THYROID CARCINOMA, 1.7 CM IN GREATEST DIMENSION, CONFINED WITHIN THE THYROID TISSUE. - ANGIOLYMPHATIC INVASION PRESENT. - ISTHMUS RESECTION MARGIN INVOLVED BY TUMOR. - FOLLICULAR ADENOMA(S) WITH ASSOCIATED LYMPHOCYTIC THYROIDITIS.     . AK (actinic keratosis) 04/16/2012  . ALLERGIC RHINITIS 01/30/2009   Qualifier: Diagnosis of  By: Andria Frames MD, Gwyndolyn Saxon    . Anxiety   . ANXIETY DEPRESSION 09/03/2010   Long standing depression with anxiety. Good response to medications initially. Has been to [facility].    Marland Kitchen Arthritis   . 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 08/11/2007   LEFT BLT:JQZES hip replacement (Dr Berenice Primas) with recalled hardware. Had revision by Dr Berenice Primas in 2011 with  ASR .Marland Kitchen    Marland Kitchen Atrial dysrhythmia    FOLLOWED BY DR Caryl Comes WAS HAVING PALPITATIONS AND SYNCOPAL EPISODES ONSET 9 YEARS AGO ; STARTED ON FLECAIINIDE , PER PATIENT TOLERATES WELL, LAST SYNCOPAL EPISODE WAS OVER 5 YEARS AGO ; SEE LOV IN  EPIC   . Atrial tachycardia ([place]) 06/05/2011   \Treated with flecainide-2011 2010-QRS duration 88 ms 2011 QRS duration 88 ms 2014 QRS duration 98 ms 2014 QRS 120mg    120 ms    . Bilateral bunions 12/18/2010  . Bilateral knee pain 06/29/2015  . Cancer [facility])    thyroid-status post resection on replacement Dr. Chalmers Cater  . Clinical depression 03/06/2016  . Cognitive changes 10/12/2014  . Complication of anesthesia    FENTANYL CAUSED  ANAPHYLAXIS DURING CA COLONOSCOPY  . Depression   . Dysrhythmia    Dr Caryl Comes monitoring patient, atrial tachycardia under control, cardiac clearance 09/10/17  . Family history of diabetes mellitus (DM) 05/11/2014  . Fibromyalgia   . Gait abnormality 12/18/2010  . GE reflux   . GERD 07/14/2007   Qualifier: Diagnosis of  By: Nori Riis MD, Clarise Cruz    . Headache    migraines  . Headache upon awakening 06/05/2011  . Herniated lumbar disc without myelopathy 12/10/2013   Had surgery by neurosurgery in December, 2014.L4-5 laminotomy and microdiscectomy Dr Margreta Journey Done at [facility]   . Hip pain, chronic 04/05/2015   Status post THR plus revision.   . Hypoxemia 01/30/2015  . Memory change 05/12/2014   Reports problems with short-term memory, focusing, concentration in the last 6-12 months. Problems were significant enough that she had to quit her part-time job is fairly distressed about this.   . Metatarsalgia 12/18/2010  . Nausea & vomiting 06/05/2011  . Nocturia more than twice per night 01/30/2015  . Nonallopathic lesion of cervical region 11/26/2011  . Nonallopathic lesion of lumbosacral region 11/26/2011  . OSA on CPAP 01/30/2015  . Osteoporosis   . PONV (postoperative nausea and vomiting)    did not have nausea/vomiting with nissen fundoplication done 92/3300 at [facility]  . Poor compliance with CPAP treatment 03/24/2017  . Rash and nonspecific skin eruption 01/09/2016    located in the 10 o'clock position on Right breast    . Severe obesity (BMI >= 40) (  [place]) 01/30/2015  . Sleep apnea 10/2015   x 1 year, CPAP  . Snorings 01/30/2015  . Status post Nissen fundoplication 16/09/958  . Subacute cough 03/27/2012  . Thyroid disease   . Well adult health check 06/05/2011    Surgeries: Procedure(s): TOTAL HIP ARTHROPLASTY ANTERIOR APPROACH on 09/12/2017   Consultants (if any):   Discharged Condition: Improved  Hospital Course: Rebecca Marshall is an 66 y.o. female who was admitted 09/12/2017 with a  diagnosis of Primary osteoarthritis of right hip and went to the operating room on 09/12/2017 and underwent the above named procedures.    She was given perioperative antibiotics:  Anti-infectives (From admission, onward)   Start     Dose/Rate Route Frequency Ordered Stop   09/12/17 1300  ceFAZolin (ANCEF) IVPB 2g/100 mL premix     2 g 200 mL/hr over 30 Minutes Intravenous Every 6 hours 09/12/17 1128 09/12/17 2100   09/12/17 0545  ceFAZolin (ANCEF) IVPB 2g/100 mL premix     2 g 200 mL/hr over 30 Minutes Intravenous On call to O.R. 09/12/17 4540 09/12/17 0742    .  She was given sequential compression devices, early ambulation, and ASA for DVT prophylaxis.  She benefited maximally from the hospital stay and there were no complications.  She cleared PT for d/c and was tol PO solids/liquids with flatus.  Dressing was C/D/I, NVI  Recent vital signs:  Vitals:   09/12/17 2117 09/13/17 0528  BP: (!) 115/48 112/64  Pulse: 79 90  Resp:    Temp: 98.6 F (37 C) 99.1 F (37.3 C)  SpO2: 98% 94%    Recent laboratory studies:  Lab Results  Component Value Date   HGB 10.0 (L) 09/13/2017   HGB 11.9 (L) 09/10/2017   HGB 14.0 06/26/2017   Lab Results  Component Value Date   WBC 8.7 09/13/2017   PLT 220 09/13/2017   Lab Results  Component Value Date   INR 1.01 09/10/2017   Lab Results  Component Value Date   NA 139 09/10/2017   K 3.6 09/10/2017   CL 108 09/10/2017   CO2 24 09/10/2017   BUN 17 09/10/2017   CREATININE 0.78 09/10/2017   GLUCOSE 95 09/10/2017    Discharge Medications:   Allergies as of 09/13/2017      Reactions   Nsaids Other (See Comments)   Rectal bleeding   Codeine Nausea And Vomiting   Fentanyl Other (See Comments)   "knocked out" previously with a high dose.  Can tolerate low doses.       Medication List    TAKE these medications   acetaminophen 500 MG tablet Commonly known as:  TYLENOL Take 1,000 mg by mouth every 8 (eight) hours as needed for  mild pain or moderate pain.   aspirin EC 325 MG tablet Take 1 tablet (325 mg total) by mouth 2 (two) times daily after a meal. Take x 1 month post op to decrease risk of blood clots.   cetirizine 10 MG tablet Commonly known as:  ZYRTEC Take 10 mg by mouth daily as needed for allergies.   cyclobenzaprine 10 MG tablet Commonly known as:  FLEXERIL Take 1 tablet (10 mg total) by mouth 3 (three) times daily as needed for muscle spasms. What changed:    when to take this  reasons to take this   cycloSPORINE 0.05 % ophthalmic emulsion Commonly known as:  RESTASIS Place 1 drop into both eyes 4 (four) times daily.   docusate sodium 100  MG capsule Commonly known as:  COLACE Take 1 capsule (100 mg total) by mouth 2 (two) times daily.   eletriptan 40 MG tablet Commonly known as:  RELPAX Take by mouth one at the beginning of headache and may repeat once in 2  Hours for maximum of 2 tabs in 24hours What changed:    how much to take  how to take this  when to take this  reasons to take this  additional instructions   flecainide 150 MG tablet Commonly known as:  TAMBOCOR Take 1 tablet (150 mg total) by mouth 2 (two) times daily.   FLUoxetine 40 MG capsule Commonly known as:  PROZAC Take 40 mg by mouth daily.   HYDROmorphone 2 MG tablet Commonly known as:  DILAUDID Take 1-2 tablets (2-4 mg total) by mouth every 6 (six) hours as needed for severe pain.   levothyroxine 150 MCG tablet Commonly known as:  SYNTHROID, LEVOTHROID Take 1 tablet (150 mcg total) by mouth daily.   ondansetron 4 MG disintegrating tablet Commonly known as:  ZOFRAN ODT 4mg  ODT q4 hours prn nausea/vomit   prochlorperazine 10 MG tablet Commonly known as:  COMPAZINE Take 1 tablet (10 mg total) by mouth every 6 (six) hours as needed for nausea or vomiting (Use for nausea and / or vomiting unresolved with ondansetron (Zofran).).   sodium chloride 0.65 % Soln nasal spray Commonly known as:  OCEAN Place  1 spray into both nostrils as needed (For nasal dryness.).   topiramate 25 MG tablet Commonly known as:  TOPAMAX Take 50 mg by mouth at bedtime.   traMADol 50 MG tablet Commonly known as:  ULTRAM Take 2 tablets (100 mg total) by mouth every 6 (six) hours. What changed:    when to take this  reasons to take this   traZODone 100 MG tablet Commonly known as:  DESYREL Take 2 tablets (200 mg total) by mouth at bedtime.   triamcinolone cream 0.1 % Commonly known as:  KENALOG Apply 1 application topically 2 (two) times daily as needed (for ezcema).       Diagnostic Studies: Dg C-arm 61-120 Min  Result Date: 09/12/2017 CLINICAL DATA:  66 year old female undergoing right hip arthroplasty. EXAM: DG C-ARM 61-120 MIN; OPERATIVE RIGHT HIP WITH PELVIS COMPARISON:  Lumbar radiographs 08/06/2016. FINDINGS: Preexisting left total hip arthroplasty. AP fluoroscopic spot views of the lower pelvis and right hip demonstrating new bipolar type right hip arthroplasty. Hardware appears intact and normally aligned in the AP view. FLUOROSCOPY TIME:  0 minutes 51 seconds IMPRESSION: Right bipolar type hip arthroplasty placed. Electronically Signed   By: Genevie Ann M.D.   On: 09/12/2017 09:51   Mm Screening Breast Tomo Bilateral  Result Date: 08/26/2017 CLINICAL DATA:  Screening. EXAM: 2D DIGITAL SCREENING BILATERAL MAMMOGRAM WITH CAD AND ADJUNCT TOMO COMPARISON:  Previous exam(s). ACR Breast Density Category a: The breast tissue is almost entirely fatty. FINDINGS: There are no findings suspicious for malignancy. Images were processed with CAD. IMPRESSION: No mammographic evidence of malignancy. A result letter of this screening mammogram will be mailed directly to the patient. RECOMMENDATION: Screening mammogram in one year. (Code:SM-B-01Y) BI-RADS CATEGORY  1: Negative. Electronically Signed   By: Marin Olp M.D.   On: 08/26/2017 14:20   Dg Hip Operative Unilat With Pelvis Right  Result Date:  09/12/2017 CLINICAL DATA:  66 year old female undergoing right hip arthroplasty. EXAM: DG C-ARM 61-120 MIN; OPERATIVE RIGHT HIP WITH PELVIS COMPARISON:  Lumbar radiographs 08/06/2016. FINDINGS: Preexisting left total hip  arthroplasty. AP fluoroscopic spot views of the lower pelvis and right hip demonstrating new bipolar type right hip arthroplasty. Hardware appears intact and normally aligned in the AP view. FLUOROSCOPY TIME:  0 minutes 51 seconds IMPRESSION: Right bipolar type hip arthroplasty placed. Electronically Signed   By: Genevie Ann M.D.   On: 09/12/2017 09:51    Disposition: 01-Home or Self Care    Follow-up Information    Dorna Leitz, MD. Schedule an appointment as soon as possible for a visit in 2 week(s).   Specialty:  Orthopedic Surgery Contact information: [place] [address] [phone]            Signed: Jolyn Nap 09/13/2017, 9:44 AM

## 2017-09-15 ENCOUNTER — Encounter (HOSPITAL_COMMUNITY): Payer: Self-pay | Admitting: Orthopedic Surgery

## 2017-09-15 DIAGNOSIS — Z85850 Personal history of malignant neoplasm of thyroid: Secondary | ICD-10-CM | POA: Diagnosis not present

## 2017-09-15 DIAGNOSIS — F329 Major depressive disorder, single episode, unspecified: Secondary | ICD-10-CM | POA: Diagnosis not present

## 2017-09-15 DIAGNOSIS — Z7982 Long term (current) use of aspirin: Secondary | ICD-10-CM | POA: Diagnosis not present

## 2017-09-15 DIAGNOSIS — F419 Anxiety disorder, unspecified: Secondary | ICD-10-CM | POA: Diagnosis not present

## 2017-09-15 DIAGNOSIS — Z471 Aftercare following joint replacement surgery: Secondary | ICD-10-CM | POA: Diagnosis not present

## 2017-09-15 DIAGNOSIS — Z79891 Long term (current) use of opiate analgesic: Secondary | ICD-10-CM | POA: Diagnosis not present

## 2017-09-15 DIAGNOSIS — Z96641 Presence of right artificial hip joint: Secondary | ICD-10-CM | POA: Diagnosis not present

## 2017-09-16 LAB — TYPE AND SCREEN
ABO/RH(D): O POS
Antibody Screen: POSITIVE
Donor AG Type: NEGATIVE
Donor AG Type: NEGATIVE
Unit division: 0
Unit division: 0

## 2017-09-16 LAB — BPAM RBC
Blood Product Expiration Date: 201901142359
Blood Product Expiration Date: 201901152359
Unit Type and Rh: 5100
Unit Type and Rh: 5100

## 2017-09-19 NOTE — Op Note (Signed)
[name], MCGHIE NO.:  [id number]  MEDICAL RECORD NO.:  [id number]  LOCATION:  [id number]                        FACILITY:  [facility]  PHYSICIAN:  Alta Corning, M.D.   DATE OF BIRTH:  1951-03-13  DATE OF PROCEDURE:  09/12/2017 DATE OF DISCHARGE:                              OPERATIVE REPORT   PREOPERATIVE DIAGNOSIS:  End-stage degenerative joint disease, right hip.  POSTOPERATIVE DIAGNOSIS:  End-stage degenerative joint disease, right hip.  PROCEDURE:  1.Right total hip replacement with a Corail system size 11, 52 mm Gription cup with no holes, a 36 mm delta ceramic hip ball +1.5, and a +4 neutral liner. 2.interpretation of multiple intraoperative images SURGEON:  Alta Corning, MD.  Terrence DupontModena Slater.  ANESTHESIA:  Spinal.  BRIEF HISTORY:  Ms. Rebecca Marshall is a 66 year old female with a long history and significant complaints of right hip pain.  She had been treated conservatively for prolonged period of time.  After failure of all conservative care, she was taken to the operating room for right total hip replacement.  She had a previous left total hip replacement, which was complicated by being an ASR, which ultimately had to be revised, but ultimately she had done well with that procedure.  She was brought to the operating room for right total hip replacement.  DESCRIPTION OF PROCEDURE:  The patient was brought to the operating room.  After adequate anesthesia was obtained with spinal anesthetic, the patient was put supine on the operating table.  She was then moved onto the [place] bed.  All bony prominences were well padded.  Boots were placed and Foley was placed.  Following this, attention was turned to the right hip where after routine prep and drape, an incision was made for an anterior approach to the hip, subcutaneous tissue down the level of the tensor fascia, was divided in line with its fibers.  The muscle was finger fractured and retractors were  put in place above and below the neck.  At this point, the capsule was opened and tagged, and retractors were replaced.  Provisional neck cut was made and head removed.  Following this, retractors were put in place and she was sequentially reamed to a level of a 51 mm and a 52 mm Gription cup was placed 45 degrees of lateral opening and 30 degrees of anteversion.  A +4 neutral liner was placed after hole eliminator.  Following this, attention was turned to the stem where the arm was placed on the [place] bed.  The hip was externally rotated, extended, and put in an adducting position with the arm behind the femur and it was then elevated into position.  Posterior capsule was released and the box osteotome followed by sequential rasps were used to open the proximal femur and this was reamed up to a level of 11 mm and a final Corail size 11 was then opened and placed.  The +1.5 trial was used.  This gave [place] excellent symmetric leg length.  Following this, attention was turned towards the final fluoro images, which showed excellent symmetric leg length and fit and fill and the final Corail stem was opened and placed, followed  by +1.5 hip ball and then a final reduction was undertaken.  Final fluoro images were taken.  At this point, excellent fill, fit, reduction, and stability were achieved.  The anterior capsule was then closed with 1 Vicryl running, the tensor fascia closed with 0 Vicryl running, the skin with 0 and 2-0 Vicryl and 3-0 Monocryl subcuticular.  Benzoin and Steri- Strips were applied.  Sterile compressive dressing was applied, and the patient was taken to the recovery room and was noted to be in satisfactory condition.  Estimated blood loss for procedure was about 450 mL.  The final can be gotten from anesthetic record.  Fluoro was used throughout the case to assess geometry, fit and fill.  Gary Fleet assisted throughout the case and was critical on the performance of the  case.     Alta Corning, M.D.     Corliss Skains  D:  09/19/2017  T:  09/19/2017  Job:  [id number]  cc:   Alta Corning, M.D.

## 2017-09-19 NOTE — Brief Op Note (Signed)
09/12/2017  9:34 AM  PATIENT:  Rebecca Marshall  66 y.o. female  PRE-OPERATIVE DIAGNOSIS:  RIGHT HIP OSTEOARTHRITIS WITH LABRAL TEAR AND CARTILAGE LOSS  POST-OPERATIVE DIAGNOSIS:  RIGHT HIP OSTEOARTHRITIS WITH LABRAL TEAR AND CARTILAGE LOSS  PROCEDURE:  Procedure(s): TOTAL HIP ARTHROPLASTY ANTERIOR APPROACH (Right)  SURGEON:  Surgeon(s) and Role:    Dorna Leitz, MD - Primary  PHYSICIAN ASSISTANT:   ASSISTANTS: bethune   ANESTHESIA:   spinal  EBL:  450 mL   BLOOD ADMINISTERED:none  DRAINS: none   LOCAL MEDICATIONS USED:  MARCAINE    and OTHER experel  SPECIMEN:  No Specimen  DISPOSITION OF SPECIMEN:  N/A  COUNTS:  YES  TOURNIQUET:  * No tourniquets in log *  DICTATION: .Other Dictation: Dictation Number [id number]  PLAN OF CARE: Admit to inpatient   PATIENT DISPOSITION:  PACU - hemodynamically stable.   Delay start of Pharmacological VTE agent (>24hrs) due to surgical blood loss or risk of bleeding: no

## 2017-09-29 DIAGNOSIS — M1611 Unilateral primary osteoarthritis, right hip: Secondary | ICD-10-CM | POA: Diagnosis not present

## 2017-09-29 MED FILL — CYCLOBENZAPRINE 10 MG TAB: 10 | 14 days supply | Qty: 40 | Fill #0

## 2017-10-28 DIAGNOSIS — M1611 Unilateral primary osteoarthritis, right hip: Secondary | ICD-10-CM | POA: Diagnosis not present

## 2017-10-28 DIAGNOSIS — F332 Major depressive disorder, recurrent severe without psychotic features: Secondary | ICD-10-CM | POA: Diagnosis not present

## 2017-11-10 MED FILL — SERTRALINE HCL 100 MG TAB: 100 | 30 days supply | Qty: 30 | Fill #0

## 2017-12-01 ENCOUNTER — Other Ambulatory Visit: Payer: Self-pay | Admitting: Neurology

## 2017-12-01 ENCOUNTER — Telehealth: Payer: Self-pay | Admitting: Neurology

## 2017-12-01 MED ORDER — TOPIRAMATE 25 MG PO TABS: 50.0000 mg | ORAL_TABLET | Freq: Every day | ORAL | 0 refills | Status: DC

## 2017-12-01 NOTE — Telephone Encounter (Signed)
Called the patient and made her aware that she hadnt seen Dr Jaynee Eagles since 2017 for her headaches. The pt sees Dr Brett Fairy for sleep apnea. She thought that since she saw her we could proceed forward with refills but I did explain to the patient that Dr Jaynee Eagles saw her for headaches and Dr Brett Fairy followed her for sleep. I made the patient aware that she could talk to her PCP and see If they would be willing to continue to topiramate for her or if she would like for [place] to continue to refill she would need to make a f/u with a NP. The patient states that she will touch base with her PCP and see if they would be willing to continue the medication for her. In the meantime Dr Jaynee Eagles is ok with refilling the medication for a 3 mth supply one time. The pt verbalized understanding that we will not be able to refill in future without a f/u apt.

## 2017-12-01 NOTE — Telephone Encounter (Signed)
Pt is actually a pt of Dr Jaynee Eagles for her headaches. She has not seen Dr Jaynee Eagles since 2017. Wanted to make sure Dr Jaynee Eagles is ok with refilling the topamax of if she wanted the patient to make an apt?

## 2017-12-01 NOTE — Telephone Encounter (Signed)
Pt requesting a new prescription for topiramate (TOPAMAX) 25 MG tablet be sent to [facility] home delivery

## 2017-12-01 NOTE — Telephone Encounter (Signed)
Can refill topiramate for 3 months. She should either get her primary care to continue refilling or she has to be seen here if she wamts [place] to fill it. If she wants to be seen her please with NP Hoyle Sauer or Jinny Blossom, does not need to be with MD thanks

## 2017-12-03 MED FILL — SERTRALINE HCL 100 MG TAB: 100 | 30 days supply | Qty: 30 | Fill #1

## 2017-12-09 DIAGNOSIS — R300 Dysuria: Secondary | ICD-10-CM | POA: Diagnosis not present

## 2017-12-23 DIAGNOSIS — M25551 Pain in right hip: Secondary | ICD-10-CM | POA: Diagnosis not present

## 2018-01-01 ENCOUNTER — Telehealth: Payer: Self-pay | Admitting: Internal Medicine

## 2018-01-01 NOTE — Telephone Encounter (Signed)
New Message    Pt c/o Shortness Of Breath: STAT if SOB developed within the last 24 hours or pt is noticeably SOB on the phone  1. Are you currently SOB (can you hear that pt is SOB on the phone)? No, does not sound like she is having shortness of breath  2. How long have you been experiencing SOB? About a week  3. Are you SOB when sitting or when up moving around? When moving around  4. Are you currently experiencing any other symptoms? Fatigue   Patient states that she gets the same feeling that she had when she had her stress test. Please call to discuss.

## 2018-01-01 NOTE — Telephone Encounter (Signed)
Pt calls today with c/o fatigue and generalized "heaviness in my arms and legs." She was at a ball game a couple nights ago and felt like she was having palpitations. The paramedic onsite took her vitals, SBP was 147 with HR of 86 and regular. I offered to set her up with an appt with Dr Caryl Comes, she declined. She has an appt on Monday April 15th with her PCP to discuss. I suggested she ask to have them preform an EKG since she is on Flecainide. She will call back if she or her PCP believe she needs to be seen by Dr Caryl Comes.

## 2018-01-05 DIAGNOSIS — F331 Major depressive disorder, recurrent, moderate: Secondary | ICD-10-CM | POA: Diagnosis not present

## 2018-01-05 DIAGNOSIS — Z79899 Other long term (current) drug therapy: Secondary | ICD-10-CM | POA: Diagnosis not present

## 2018-01-05 DIAGNOSIS — E892 Postprocedural hypoparathyroidism: Secondary | ICD-10-CM | POA: Diagnosis not present

## 2018-01-05 DIAGNOSIS — Z6834 Body mass index (BMI) 34.0-34.9, adult: Secondary | ICD-10-CM | POA: Diagnosis not present

## 2018-01-05 DIAGNOSIS — M791 Myalgia, unspecified site: Secondary | ICD-10-CM | POA: Diagnosis not present

## 2018-01-05 DIAGNOSIS — R0609 Other forms of dyspnea: Secondary | ICD-10-CM | POA: Diagnosis not present

## 2018-01-07 ENCOUNTER — Ambulatory Visit (HOSPITAL_COMMUNITY)
Admission: RE | Admit: 2018-01-07 | Discharge: 2018-01-07 | Disposition: A | Discharge status: Home / Self Care | Payer: BLUE CROSS/BLUE SHIELD | Source: Ambulatory Visit | Attending: Family Medicine | Admitting: Family Medicine

## 2018-01-07 DIAGNOSIS — D509 Iron deficiency anemia, unspecified: Secondary | ICD-10-CM | POA: Insufficient documentation

## 2018-01-07 LAB — PREPARE RBC (CROSSMATCH)

## 2018-01-07 MED ORDER — SODIUM CHLORIDE 0.9 % IV SOLN: Freq: Once | INTRAVENOUS | Status: DC

## 2018-01-07 NOTE — Progress Notes (Signed)
Pt presented to Patient Rebecca Marshall for blood transfusion ordered by Dr. Kathyrn Lass. After type and screen received by blood bank, technician called to let us know that pt has antibodies that require additional screening. They expect the process to take at least 2 hours, and only have one unit on hand that might potentially match. Second unit will need to be ordered, possibly from [place]. Pt advised of the above; she decided she will go home and return tomorrow for the transfusion instead, once it is ready. Patient discharged in stable ambulatory condition, instructed with teach back to leave her blood bank wrist band in place. Blood bank notified.  Coolidge Breeze, RN 01/07/2018

## 2018-01-08 ENCOUNTER — Ambulatory Visit (HOSPITAL_COMMUNITY)
Admission: RE | Admit: 2018-01-08 | Discharge: 2018-01-08 | Disposition: A | Discharge status: Home / Self Care | Payer: BLUE CROSS/BLUE SHIELD | Source: Ambulatory Visit | Attending: Family Medicine | Admitting: Family Medicine

## 2018-01-08 DIAGNOSIS — D509 Iron deficiency anemia, unspecified: Secondary | ICD-10-CM | POA: Diagnosis not present

## 2018-01-08 LAB — PREPARE RBC (CROSSMATCH)

## 2018-01-08 MED ORDER — SODIUM CHLORIDE 0.9 % IV SOLN: Freq: Once | INTRAVENOUS | Status: DC

## 2018-01-08 NOTE — Discharge Instructions (Signed)

## 2018-01-08 NOTE — Progress Notes (Signed)
PATIENT CARE CENTER NOTE  Diagnosis: Iron Deficiency Anemia    Provider: Dr. Kathyrn Lass   Procedure: 2 units PRBC's    Note: Patient received transfusion of 2 units of blood. Patient tolerated transfusion well with no adverse reaction. Vital signs remained stable throughout transfusion. Discharge instructions given to patient. Patient alert, oriented and ambulatory at discharge.

## 2018-01-09 LAB — BPAM RBC
Blood Product Expiration Date: 201904242359
Blood Product Expiration Date: 201905092359
ISSUE DATE / TIME: 201904180817
ISSUE DATE / TIME: 201904180817
Unit Type and Rh: 5100
Unit Type and Rh: 5100

## 2018-01-09 LAB — TYPE AND SCREEN
ABO/RH(D): O POS
Antibody Screen: POSITIVE
Donor AG Type: NEGATIVE
Donor AG Type: NEGATIVE
Unit division: 0
Unit division: 0

## 2018-01-15 DIAGNOSIS — E890 Postprocedural hypothyroidism: Secondary | ICD-10-CM | POA: Diagnosis not present

## 2018-01-15 DIAGNOSIS — C73 Malignant neoplasm of thyroid gland: Secondary | ICD-10-CM | POA: Diagnosis not present

## 2018-01-16 DIAGNOSIS — D649 Anemia, unspecified: Secondary | ICD-10-CM | POA: Diagnosis not present

## 2018-01-16 DIAGNOSIS — Z86010 Personal history of colonic polyps: Secondary | ICD-10-CM | POA: Diagnosis not present

## 2018-01-22 DIAGNOSIS — E890 Postprocedural hypothyroidism: Secondary | ICD-10-CM | POA: Diagnosis not present

## 2018-01-22 DIAGNOSIS — C73 Malignant neoplasm of thyroid gland: Secondary | ICD-10-CM | POA: Diagnosis not present

## 2018-01-22 DIAGNOSIS — M899 Disorder of bone, unspecified: Secondary | ICD-10-CM | POA: Diagnosis not present

## 2018-01-22 MED FILL — LEVOTHYROXINE 137 MCG TABLE: 137 | 30 days supply | Qty: 30 | Fill #0

## 2018-01-23 ENCOUNTER — Other Ambulatory Visit: Payer: Self-pay | Admitting: Endocrinology

## 2018-01-23 DIAGNOSIS — E2839 Other primary ovarian failure: Secondary | ICD-10-CM

## 2018-01-23 DIAGNOSIS — D500 Iron deficiency anemia secondary to blood loss (chronic): Secondary | ICD-10-CM | POA: Diagnosis not present

## 2018-01-23 DIAGNOSIS — D126 Benign neoplasm of colon, unspecified: Secondary | ICD-10-CM | POA: Diagnosis not present

## 2018-01-23 DIAGNOSIS — R5381 Other malaise: Secondary | ICD-10-CM

## 2018-01-23 DIAGNOSIS — D509 Iron deficiency anemia, unspecified: Secondary | ICD-10-CM | POA: Diagnosis not present

## 2018-01-27 DIAGNOSIS — D126 Benign neoplasm of colon, unspecified: Secondary | ICD-10-CM | POA: Diagnosis not present

## 2018-01-27 DIAGNOSIS — M25551 Pain in right hip: Secondary | ICD-10-CM | POA: Diagnosis not present

## 2018-01-28 DIAGNOSIS — M25551 Pain in right hip: Secondary | ICD-10-CM | POA: Diagnosis not present

## 2018-02-02 DIAGNOSIS — M25551 Pain in right hip: Secondary | ICD-10-CM | POA: Diagnosis not present

## 2018-02-05 DIAGNOSIS — M25551 Pain in right hip: Secondary | ICD-10-CM | POA: Diagnosis not present

## 2018-02-09 DIAGNOSIS — M25551 Pain in right hip: Secondary | ICD-10-CM | POA: Diagnosis not present

## 2018-02-12 DIAGNOSIS — M25551 Pain in right hip: Secondary | ICD-10-CM | POA: Diagnosis not present

## 2018-02-17 DIAGNOSIS — M25551 Pain in right hip: Secondary | ICD-10-CM | POA: Diagnosis not present

## 2018-02-19 DIAGNOSIS — M25551 Pain in right hip: Secondary | ICD-10-CM | POA: Diagnosis not present

## 2018-02-19 MED FILL — LEVOTHYROXINE 137 MCG TABLE: 137 | 30 days supply | Qty: 30 | Fill #1

## 2018-02-20 DIAGNOSIS — K219 Gastro-esophageal reflux disease without esophagitis: Secondary | ICD-10-CM | POA: Diagnosis not present

## 2018-02-20 DIAGNOSIS — D509 Iron deficiency anemia, unspecified: Secondary | ICD-10-CM | POA: Diagnosis not present

## 2018-02-20 DIAGNOSIS — K449 Diaphragmatic hernia without obstruction or gangrene: Secondary | ICD-10-CM | POA: Diagnosis not present

## 2018-03-03 ENCOUNTER — Ambulatory Visit
Admission: RE | Admit: 2018-03-03 | Discharge: 2018-03-03 | Disposition: A | Discharge status: Home / Self Care | Payer: BLUE CROSS/BLUE SHIELD | Source: Ambulatory Visit | Attending: Endocrinology | Admitting: Endocrinology

## 2018-03-03 DIAGNOSIS — Z780 Asymptomatic menopausal state: Secondary | ICD-10-CM | POA: Diagnosis not present

## 2018-03-03 DIAGNOSIS — M8588 Other specified disorders of bone density and structure, other site: Secondary | ICD-10-CM | POA: Diagnosis not present

## 2018-03-03 DIAGNOSIS — E2839 Other primary ovarian failure: Secondary | ICD-10-CM

## 2018-03-04 DIAGNOSIS — E890 Postprocedural hypothyroidism: Secondary | ICD-10-CM | POA: Diagnosis not present

## 2018-03-09 ENCOUNTER — Other Ambulatory Visit: Payer: Self-pay | Admitting: Surgery

## 2018-03-09 DIAGNOSIS — K2100 Gastro-esophageal reflux disease with esophagitis, without bleeding: Secondary | ICD-10-CM

## 2018-03-09 DIAGNOSIS — K449 Diaphragmatic hernia without obstruction or gangrene: Secondary | ICD-10-CM

## 2018-03-16 MED FILL — LEVOTHYROXINE 137 MCG TABLE: 137 | 30 days supply | Qty: 30 | Fill #2

## 2018-03-17 ENCOUNTER — Ambulatory Visit
Admission: RE | Admit: 2018-03-17 | Discharge: 2018-03-17 | Disposition: A | Discharge status: Home / Self Care | Payer: BLUE CROSS/BLUE SHIELD | Source: Ambulatory Visit | Attending: Surgery | Admitting: Surgery

## 2018-03-17 DIAGNOSIS — K2100 Gastro-esophageal reflux disease with esophagitis, without bleeding: Secondary | ICD-10-CM

## 2018-03-17 DIAGNOSIS — K449 Diaphragmatic hernia without obstruction or gangrene: Secondary | ICD-10-CM | POA: Diagnosis not present

## 2018-03-17 DIAGNOSIS — K219 Gastro-esophageal reflux disease without esophagitis: Secondary | ICD-10-CM | POA: Diagnosis not present

## 2018-03-23 ENCOUNTER — Other Ambulatory Visit: Payer: Self-pay | Admitting: Neurology

## 2018-03-23 DIAGNOSIS — R197 Diarrhea, unspecified: Secondary | ICD-10-CM | POA: Diagnosis not present

## 2018-03-23 DIAGNOSIS — D509 Iron deficiency anemia, unspecified: Secondary | ICD-10-CM | POA: Diagnosis not present

## 2018-03-23 DIAGNOSIS — K449 Diaphragmatic hernia without obstruction or gangrene: Secondary | ICD-10-CM | POA: Diagnosis not present

## 2018-03-25 ENCOUNTER — Ambulatory Visit: Payer: PPO | Admitting: Neurology

## 2018-04-01 ENCOUNTER — Encounter: Payer: Self-pay | Admitting: Oncology

## 2018-04-01 ENCOUNTER — Telehealth: Payer: Self-pay | Admitting: Oncology

## 2018-04-01 NOTE — Telephone Encounter (Signed)
New referral received from [facility] for IDA. Pt has been scheduled to see Dr. Alen Blew on 7/16 at 11am. Pt aware to arrive 30 minutes early. Letter mailed.

## 2018-04-06 ENCOUNTER — Telehealth: Payer: Self-pay | Admitting: *Deleted

## 2018-04-06 NOTE — Telephone Encounter (Signed)
Called patient and reminded her of her NEW patient appointment on Tuesday, July, 16th at 11:00 am with Dr. Alen Blew. She verbalized understanding.

## 2018-04-07 ENCOUNTER — Inpatient Hospital Stay: Payer: BLUE CROSS/BLUE SHIELD

## 2018-04-07 ENCOUNTER — Inpatient Hospital Stay: Payer: BLUE CROSS/BLUE SHIELD | Attending: Oncology | Admitting: Oncology

## 2018-04-07 ENCOUNTER — Telehealth: Payer: Self-pay

## 2018-04-07 VITALS — BP 117/86 | HR 70 | Temp 98.8°F | Resp 18 | Ht 67.0 in | Wt 219.7 lb

## 2018-04-07 DIAGNOSIS — Z7982 Long term (current) use of aspirin: Secondary | ICD-10-CM | POA: Insufficient documentation

## 2018-04-07 DIAGNOSIS — Z85850 Personal history of malignant neoplasm of thyroid: Secondary | ICD-10-CM | POA: Insufficient documentation

## 2018-04-07 DIAGNOSIS — K219 Gastro-esophageal reflux disease without esophagitis: Secondary | ICD-10-CM | POA: Insufficient documentation

## 2018-04-07 DIAGNOSIS — Z96642 Presence of left artificial hip joint: Secondary | ICD-10-CM | POA: Insufficient documentation

## 2018-04-07 DIAGNOSIS — K449 Diaphragmatic hernia without obstruction or gangrene: Secondary | ICD-10-CM | POA: Diagnosis not present

## 2018-04-07 DIAGNOSIS — Z79899 Other long term (current) drug therapy: Secondary | ICD-10-CM | POA: Diagnosis not present

## 2018-04-07 DIAGNOSIS — Z9989 Dependence on other enabling machines and devices: Secondary | ICD-10-CM | POA: Diagnosis not present

## 2018-04-07 DIAGNOSIS — K909 Intestinal malabsorption, unspecified: Secondary | ICD-10-CM | POA: Insufficient documentation

## 2018-04-07 DIAGNOSIS — G4733 Obstructive sleep apnea (adult) (pediatric): Secondary | ICD-10-CM | POA: Diagnosis not present

## 2018-04-07 DIAGNOSIS — M797 Fibromyalgia: Secondary | ICD-10-CM | POA: Diagnosis not present

## 2018-04-07 DIAGNOSIS — K921 Melena: Secondary | ICD-10-CM | POA: Insufficient documentation

## 2018-04-07 DIAGNOSIS — F418 Other specified anxiety disorders: Secondary | ICD-10-CM | POA: Insufficient documentation

## 2018-04-07 DIAGNOSIS — D509 Iron deficiency anemia, unspecified: Secondary | ICD-10-CM | POA: Insufficient documentation

## 2018-04-07 LAB — CBC WITH DIFFERENTIAL (CANCER CENTER ONLY)
Basophils Absolute: 0.1 10*3/uL (ref 0.0–0.1)
Basophils Relative: 1 %
Eosinophils Absolute: 0.1 10*3/uL (ref 0.0–0.5)
Eosinophils Relative: 2 %
HCT: 36.6 % (ref 34.8–46.6)
Hemoglobin: 12.2 g/dL (ref 11.6–15.9)
Lymphocytes Relative: 25 %
Lymphs Abs: 1.2 10*3/uL (ref 0.9–3.3)
MCH: 28 pg (ref 25.1–34.0)
MCHC: 33.3 g/dL (ref 31.5–36.0)
MCV: 83.9 fL (ref 79.5–101.0)
Monocytes Absolute: 0.5 10*3/uL (ref 0.1–0.9)
Monocytes Relative: 11 %
Neutro Abs: 2.9 10*3/uL (ref 1.5–6.5)
Neutrophils Relative %: 61 %
Platelet Count: 220 10*3/uL (ref 145–400)
RBC: 4.36 MIL/uL (ref 3.70–5.45)
RDW: 20.9 % — ABNORMAL HIGH (ref 11.2–14.5)
WBC Count: 4.7 10*3/uL (ref 3.9–10.3)

## 2018-04-07 LAB — IRON AND TIBC
Iron: 60 ug/dL (ref 41–142)
Saturation Ratios: 16 % — ABNORMAL LOW (ref 21–57)
TIBC: 370 ug/dL (ref 236–444)
UIBC: 310 ug/dL

## 2018-04-07 LAB — FERRITIN: Ferritin: 27 ng/mL (ref 11–307)

## 2018-04-07 NOTE — Progress Notes (Signed)
Reason for the request: Iron deficiency anemia  HPI: I was asked by Dr. Sabra Heck to evaluate Rebecca Marshall for iron deficiency anemia.  He is a pleasant 67 year old woman with history of thyroid cancer, left hip replacement as well as hiatal hernia.  She presented sometimes of excessive fatigue and tiredness and found to be anemic with a hemoglobin of 6.9 in April 2019.  She received 2 units of packed red cell transfusion between April 17 and January 08, 2018.  She had a GI evaluation by Dr. Cristina Gong including colonoscopy and endoscopy.  Her endoscopy showed that her Nissen fundoplication surgery has slipped and with recurrence of her large hiatal hernia.  Her colonoscopy showed small polyps without any malignancy and biopsy showed no celiac disease.  A repeat CBC in July 2019 showed a hemoglobin of 11.9, her MCV is 83 with elevated RDW.  Her iron studies showed iron level of 41 with saturation of 10%.  Her ferritin was 31.  She has been intolerant to oral iron including symptoms of abdominal pain, diarrhea and dyspepsia.  She continues to have symptoms of fatigue and tiredness and occasional dizziness.  She denies any dyspnea on exertion, chest pain or palpitation.  She does report occasional hematochezia but no melena.  She does not report any headaches, blurry vision, syncope or seizures. Does not report any fevers, chills or sweats.  Does not report any cough, wheezing or hemoptysis.  Does not report any chest pain, palpitation, orthopnea or leg edema.  Does not report any nausea, vomiting or abdominal pain.  Does not report any constipation or diarrhea.  Does not report any skeletal complaints.    Does not report frequency, urgency or hematuria.  Does not report any skin rashes or lesions. Does not report any heat or cold intolerance.  Does not report any lymphadenopathy or petechiae.  Does not report any anxiety or depression.  Remaining review of systems is negative.    Past Medical History:  Diagnosis Date   . Acne nodule 11/26/2011  . ADENOCARCINOMA, THYROID GLAND, PAPILLARY 05/17/2009   04/2009 Two surgeries for thyroid cancer Path report from last surgery :THYROID, LEFT, HEMITHYROIDECTOMY: - MULTIPLE FOCI OF FOLLICULAR VARIANT OR PAPILLARY THYROID CARCINOMA, 1.7 CM IN GREATEST DIMENSION, CONFINED WITHIN THE THYROID TISSUE. - ANGIOLYMPHATIC INVASION PRESENT. - ISTHMUS RESECTION MARGIN INVOLVED BY TUMOR. - FOLLICULAR ADENOMA(S) WITH ASSOCIATED LYMPHOCYTIC THYROIDITIS.     . AK (actinic keratosis) 04/16/2012  . ALLERGIC RHINITIS 01/30/2009   Qualifier: Diagnosis of  By: Andria Frames MD, Gwyndolyn Saxon    . Anxiety   . ANXIETY DEPRESSION 09/03/2010   Long standing depression with anxiety. Good response to medications initially. Has been to [facility].    Marland Kitchen Arthritis   . 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 08/11/2007   LEFT ENI:DPOEU hip replacement (Dr Berenice Primas) with recalled hardware. Had revision by Dr Berenice Primas in 2011 with  ASR .Marland Kitchen    Marland Kitchen Atrial dysrhythmia    FOLLOWED BY DR Caryl Comes WAS HAVING PALPITATIONS AND SYNCOPAL EPISODES ONSET 9 YEARS AGO ; STARTED ON FLECAIINIDE , PER PATIENT TOLERATES WELL, LAST SYNCOPAL EPISODE WAS OVER 5 YEARS AGO ; SEE LOV IN  EPIC   . Atrial tachycardia ([place]) 06/05/2011   \Treated with flecainide-2011 2010-QRS duration 88 ms 2011 QRS duration 88 ms 2014 QRS duration 98 ms 2014 QRS 120mg    120 ms    . Bilateral bunions 12/18/2010  . Bilateral knee pain 06/29/2015  . Cancer [facility])    thyroid-status post resection on  replacement Dr. Chalmers Cater  . Clinical depression 03/06/2016  . Cognitive changes 10/12/2014  . Complication of anesthesia    FENTANYL CAUSED ANAPHYLAXIS DURING CA COLONOSCOPY  . Depression   . Dysrhythmia    Dr Caryl Comes monitoring patient, atrial tachycardia under control, cardiac clearance 09/10/17  . Family history of diabetes mellitus (DM) 05/11/2014  . Fibromyalgia   . Gait abnormality 12/18/2010  . GE reflux   . GERD 07/14/2007   Qualifier: Diagnosis of  By: Nori Riis MD, Clarise Cruz    .  Headache    migraines  . Headache upon awakening 06/05/2011  . Herniated lumbar disc without myelopathy 12/10/2013   Had surgery by neurosurgery in December, 2014.L4-5 laminotomy and microdiscectomy Dr Margreta Journey Done at [facility] specialty surgical center   . Hip pain, chronic 04/05/2015   Status post THR plus revision.   . Hypoxemia 01/30/2015  . Memory change 05/12/2014   Reports problems with short-term memory, focusing, concentration in the last 6-12 months. Problems were significant enough that she had to quit her part-time job is fairly distressed about this.   . Metatarsalgia 12/18/2010  . Nausea & vomiting 06/05/2011  . Nocturia more than twice per night 01/30/2015  . Nonallopathic lesion of cervical region 11/26/2011  . Nonallopathic lesion of lumbosacral region 11/26/2011  . OSA on CPAP 01/30/2015  . Osteoporosis   . PONV (postoperative nausea and vomiting)    did not have nausea/vomiting with nissen fundoplication done 32/2025 at [facility]  . Poor compliance with CPAP treatment 03/24/2017  . Rash and nonspecific skin eruption 01/09/2016    located in the 10 o'clock position on Right breast    . Severe obesity (BMI >= 40) ([place]) 01/30/2015  . Sleep apnea 10/2015   x 1 year, CPAP  . Snorings 01/30/2015  . Status post Nissen fundoplication 42/03/622  . Subacute cough 03/27/2012  . Thyroid disease   . Well adult health check 06/05/2011  :  Past Surgical History:  Procedure Laterality Date  . ABDOMINAL HYSTERECTOMY    . BREAST EXCISIONAL BIOPSY Right 25+ yrs ago   benign  . caroitid dopplers Bilateral 11/2015   normal  . CATARACT EXTRACTION Bilateral 09/05/15, 09/12/15  . CHOLECYSTECTOMY    . COLONOSCOPY  04/2011  . ESOPHAGEAL MANOMETRY N/A 03/31/2017   Procedure: ESOPHAGEAL MANOMETRY (EM);  Surgeon: Ronald Lobo, MD;  Location: [facility];  Service: Endoscopy;  Laterality: N/A;  . LAPAROSCOPIC NISSEN FUNDOPLICATION N/A 76/10/8313   Procedure: LAPAROSCOPIC NISSEN FUNDOPLICATION;  Surgeon: Johnathan Hausen, MD;  Location: [facility] ORS;  Service: General;  Laterality: N/A;  . THYROIDECTOMY  04/2009   papillary [place] Dr. Erik Obey  . TOE SURGERY     bone spur removed - rt great toe  . TOTAL HIP ARTHROPLASTY  2009 and 2011   L hip   . TOTAL HIP ARTHROPLASTY Right 09/12/2017   Procedure: TOTAL HIP ARTHROPLASTY ANTERIOR APPROACH;  Surgeon: Dorna Leitz, MD;  Location: [place];  Service: Orthopedics;  Laterality: Right;  . TUBAL LIGATION    :   Current Outpatient Medications:  .  acetaminophen (TYLENOL) 500 MG tablet, Take 1,000 mg by mouth every 8 (eight) hours as needed for mild pain or moderate pain., Disp: , Rfl:  .  aspirin EC 325 MG tablet, Take 1 tablet (325 mg total) by mouth 2 (two) times daily after a meal. Take x 1 month post op to decrease risk of blood clots., Disp: 60 tablet, Rfl: 0 .  cetirizine (ZYRTEC) 10 MG tablet, Take 10  mg by mouth daily as needed for allergies., Disp: , Rfl:  .  cyclobenzaprine (FLEXERIL) 10 MG tablet, Take 1 tablet (10 mg total) by mouth 3 (three) times daily as needed for muscle spasms., Disp: 40 tablet, Rfl: 0 .  cycloSPORINE (RESTASIS) 0.05 % ophthalmic emulsion, Place 1 drop into both eyes 4 (four) times daily., Disp: , Rfl:  .  docusate sodium (COLACE) 100 MG capsule, Take 1 capsule (100 mg total) by mouth 2 (two) times daily., Disp: 30 capsule, Rfl: 0 .  eletriptan (RELPAX) 40 MG tablet, Take by mouth one at the beginning of headache and may repeat once in 2  Hours for maximum of 2 tabs in 24hours (Patient taking differently: Take 40 mg by mouth every 2 (two) hours as needed for migraine or headache. ), Disp: 27 tablet, Rfl: 3 .  flecainide (TAMBOCOR) 150 MG tablet, Take 1 tablet (150 mg total) by mouth 2 (two) times daily., Disp: 180 tablet, Rfl: 2 .  FLUoxetine (PROZAC) 40 MG capsule, Take 40 mg by mouth daily., Disp: , Rfl:  .  HYDROmorphone (DILAUDID) 2 MG tablet, Take 1-2 tablets (2-4 mg total) by mouth every 6 (six) hours as needed for severe pain.,  Disp: 50 tablet, Rfl: 0 .  levothyroxine (SYNTHROID, LEVOTHROID) 150 MCG tablet, Take 1 tablet (150 mcg total) by mouth daily., Disp: 90 tablet, Rfl: 1 .  ondansetron (ZOFRAN ODT) 4 MG disintegrating tablet, 4mg  ODT q4 hours prn nausea/vomit, Disp: 30 tablet, Rfl: 1 .  prochlorperazine (COMPAZINE) 10 MG tablet, Take 1 tablet (10 mg total) by mouth every 6 (six) hours as needed for nausea or vomiting (Use for nausea and / or vomiting unresolved with ondansetron (Zofran).)., Disp: 20 tablet, Rfl: 1 .  sodium chloride (OCEAN) 0.65 % SOLN nasal spray, Place 1 spray into both nostrils as needed (For nasal dryness.)., Disp: , Rfl:  .  topiramate (TOPAMAX) 25 MG tablet, TAKE 2 TABLETS AT BEDTIME, Disp: 180 tablet, Rfl: 0 .  traMADol (ULTRAM) 50 MG tablet, Take 2 tablets (100 mg total) by mouth every 6 (six) hours. (Patient taking differently: Take 100 mg by mouth every 6 (six) hours as needed. ), Disp: 30 tablet, Rfl: 1 .  traZODone (DESYREL) 100 MG tablet, Take 2 tablets (200 mg total) by mouth at bedtime., Disp: 180 tablet, Rfl: 2 .  triamcinolone cream (KENALOG) 0.1 %, Apply 1 application topically 2 (two) times daily as needed (for ezcema)., Disp: , Rfl: :  Allergies  Allergen Reactions  . Nsaids Other (See Comments)    Rectal bleeding  . Codeine Nausea And Vomiting  . Fentanyl Other (See Comments)    "knocked out" previously with a high dose.  Can tolerate low doses.   :  Family History  Problem Relation Age of Onset  . Cancer Other   . Alcohol abuse Mother   . Cancer Mother 22       Lung   . Alcohol abuse Father   . Cancer Sister 15       Breast  :  Social History   Socioeconomic History  . Marital status: Married    Spouse name: [name]  . Number of children: 3  . Years of education: 12+  . Highest education level: Not on file  Occupational History  . Occupation: unemployed   Social Needs  . Financial resource strain: Not on file  . Food insecurity:    Worry: Not on file     Inability: Not on file  .  Transportation needs:    Medical: Not on file    Non-medical: Not on file  Tobacco Use  . Smoking status: Never Smoker  . Smokeless tobacco: Never Used  Substance and Sexual Activity  . Alcohol use: No    Alcohol/week: 0.0 oz    Comment: none  . Drug use: No  . Sexual activity: Not on file  Lifestyle  . Physical activity:    Days per week: Not on file    Minutes per session: Not on file  . Stress: Not on file  Relationships  . Social connections:    Talks on phone: Not on file    Gets together: Not on file    Attends religious service: Not on file    Active member of club or organization: Not on file    Attends meetings of clubs or organizations: Not on file    Relationship status: Not on file  . Intimate partner violence:    Fear of current or ex partner: Not on file    Emotionally abused: Not on file    Physically abused: Not on file    Forced sexual activity: Not on file  Other Topics Concern  . Not on file  Social History Narrative   Patient lives at home alone    Patient is married    Patient is not working    Patient has 3 children    Patient has her BSN RN   Patient is right handed       Daily caffeine use 1 cup daily.  :  Pertinent items are noted in HPI.  Exam: Blood pressure 117/86, pulse 70, temperature 98.8 F (37.1 C), temperature source Oral, resp. rate 18, height 5\' 7"  (1.702 m), weight 219 lb 11.2 oz (99.7 kg), SpO2 96 %.   ECOG 1 General appearance: alert and cooperative appeared without distress. Head: atraumatic without any abnormalities. Eyes: conjunctivae/corneas clear. PERRL.  Sclera anicteric. Throat: lips, mucosa, and tongue normal; without oral thrush or ulcers. Resp: clear to auscultation bilaterally without rhonchi, wheezes or dullness to percussion. Cardio: regular rate and rhythm, S1, S2 normal, no murmur, click, rub or gallop GI: soft, non-tender; bowel sounds normal; no masses,  no organomegaly Skin:  Skin color, texture, turgor normal. No rashes or lesions Lymph nodes: Cervical, supraclavicular, and axillary nodes normal. Neurologic: Grossly normal without any motor, sensory or deep tendon reflexes. Musculoskeletal: No joint deformity or effusion.  CBC    Component Value Date/Time   WBC 8.7 09/13/2017 0746   RBC 3.39 (L) 09/13/2017 0746   HGB 10.0 (L) 09/13/2017 0746   HCT 31.8 (L) 09/13/2017 0746   PLT 220 09/13/2017 0746   MCV 93.8 09/13/2017 0746   MCH 29.5 09/13/2017 0746   MCHC 31.4 09/13/2017 0746   RDW 13.9 09/13/2017 0746   LYMPHSABS 1.6 09/10/2017 1500   MONOABS 0.6 09/10/2017 1500   EOSABS 0.1 09/10/2017 1500   BASOSABS 0.0 09/10/2017 1500     Dg Ugi W/high Density W/kub  Result Date: 03/17/2018 CLINICAL DATA:  History of fundoplication for hiatal hernia repair with esophageal reflux disease EXAM: UPPER GI SERIES WITH KUB TECHNIQUE: After obtaining a scout radiograph a routine upper GI series was performed using thin and high density barium. FLUOROSCOPY TIME:  Fluoroscopy Time:  1 minutes 54 seconds Radiation Exposure Index (if provided by the fluoroscopic device): 439 mGy Number of Acquired Spot Images: 0 COMPARISON:  Water-soluble upper GI 77/41/2878 after fundoplication FINDINGS: A preliminary film of the abdomen shows  a nonspecific bowel gas pattern. There are degenerative changes in the lower lumbar spine. Initially double-contrast study was performed. The mucosa the esophagus is well seen with no abnormality noted. The swallowing mechanism appears normal. However, there is a small to moderate size paraesophageal hiatal hernia present. The more distal stomach is unremarkable. The duodenal bulb fills and the duodenal loop is in normal position. At the end of the study no gastroesophageal reflux could be demonstrated with the water siphon maneuver. A barium pill was given which passed quickly into the hernia and then into the more distal stomach without delay. IMPRESSION:  1. Small to moderate size paraesophageal hiatal hernia. No definite gastroesophageal reflux. Barium pill passes into the stomach without delay. 2. The more distal stomach and the duodenum are unremarkable. Electronically Signed   By: Ivar Drape M.D.   On: 03/17/2018 08:34    Assessment and Plan:   67 year old woman with the following:  1.  Iron deficiency anemia: The etiology related to chronic blood losses and poor iron absorption could be exacerbated by her hiatal hernia.  She received 2 packets cells of transfusion in April 2019 with nearly normalization of her hemoglobin but her iron studies continues to be low despite unsuccessful oral iron replacement.  She has been intolerant to oral iron.  Risks and benefits of intravenous iron was reviewed today.  Complication associated with Feraheme include fatigue, arthralgias, myalgias and rarely infusion related reaction and anaphylaxis.  The advantage of this therapy is bypassed the gut and allow for successful replacement for her iron deficiency.  I do anticipate possible repeat IV iron in the future if she continues to have absorption or replacement issues.  No active bleeding is noted and she continues to follow with Dr. Cristina Gong and had a colonoscopy and endoscopy recently.  2.  Hiatal hernia: She has follow-up with Dr. Hassell Done to evaluate for possible repeat surgery for her slipped Nissen fundoplication.  3.  Age-appropriate cancer screening: He is up-to-date at this time.  I do not believe abdominal malignancy is responsible for her iron deficiency.  4.  Follow-up: We will be in 6 months to follow her progress and repeat iron studies.    Thank you for the referral.  I had the pleasure of meeting Rebecca Marshall today.  A copy of this consult has been forwarded to the requesting physician.

## 2018-04-07 NOTE — Telephone Encounter (Signed)
Printed avs and calender of upcoming appointment. Per 7/16 los 

## 2018-04-08 ENCOUNTER — Encounter (HOSPITAL_COMMUNITY): Payer: BLUE CROSS/BLUE SHIELD

## 2018-04-08 DIAGNOSIS — K449 Diaphragmatic hernia without obstruction or gangrene: Secondary | ICD-10-CM | POA: Diagnosis not present

## 2018-04-08 DIAGNOSIS — K210 Gastro-esophageal reflux disease with esophagitis: Secondary | ICD-10-CM | POA: Diagnosis not present

## 2018-04-09 ENCOUNTER — Ambulatory Visit: Payer: PPO | Admitting: Neurology

## 2018-04-10 ENCOUNTER — Ambulatory Visit (HOSPITAL_COMMUNITY)
Admission: RE | Admit: 2018-04-10 | Discharge: 2018-04-10 | Disposition: A | Discharge status: Home / Self Care | Payer: BLUE CROSS/BLUE SHIELD | Source: Ambulatory Visit | Attending: Family Medicine | Admitting: Family Medicine

## 2018-04-10 DIAGNOSIS — D509 Iron deficiency anemia, unspecified: Secondary | ICD-10-CM | POA: Diagnosis not present

## 2018-04-10 MED ORDER — SODIUM CHLORIDE 0.9 % IV SOLN
Freq: Once | INTRAVENOUS | Status: AC
Start: 2018-04-10 — End: 2018-04-10
  Administered 2018-04-10: 10 mL/h via INTRAVENOUS

## 2018-04-10 MED ORDER — SODIUM CHLORIDE 0.9 % IV SOLN
510.0000 mg | Freq: Once | INTRAVENOUS | Status: AC
  Administered 2018-04-10: 510 mg via INTRAVENOUS
  Filled 2018-04-10: qty 17

## 2018-04-10 NOTE — Progress Notes (Signed)
PATIENT CARE CENTER NOTE  Diagnosis: Iron Deficiency Anemia    Provider: Dr. Alen Blew   Procedure: IV Feraheme   Note: Patient received infusion of IV Feraheme. Patient tolerated infusion well with no adverse reaction. Monitored patient for 30 minutes after infusion. Vital signs stable. Discharge instructions given. Patient alert, oriented and ambulatory at discharge.

## 2018-04-10 NOTE — Discharge Instructions (Signed)

## 2018-04-15 ENCOUNTER — Encounter (HOSPITAL_COMMUNITY): Payer: BLUE CROSS/BLUE SHIELD

## 2018-04-17 ENCOUNTER — Ambulatory Visit (HOSPITAL_COMMUNITY)
Admission: RE | Admit: 2018-04-17 | Discharge: 2018-04-17 | Disposition: A | Discharge status: Home / Self Care | Payer: BLUE CROSS/BLUE SHIELD | Source: Ambulatory Visit | Attending: Gastroenterology | Admitting: Gastroenterology

## 2018-04-17 DIAGNOSIS — D509 Iron deficiency anemia, unspecified: Secondary | ICD-10-CM | POA: Diagnosis not present

## 2018-04-17 MED ORDER — FERUMOXYTOL INJECTION 510 MG/17 ML
510.0000 mg | Freq: Once | INTRAVENOUS | Status: AC
  Administered 2018-04-17: 510 mg via INTRAVENOUS
  Filled 2018-04-17: qty 17

## 2018-04-17 MED ORDER — SODIUM CHLORIDE 0.9 % IV SOLN
Freq: Once | INTRAVENOUS | Status: AC
  Administered 2018-04-17: 09:00:00 via INTRAVENOUS

## 2018-04-17 NOTE — Discharge Instructions (Signed)

## 2018-04-17 NOTE — Progress Notes (Signed)
PATIENT CARE CENTER NOTE  Diagnosis: Iron Deficiency Anemia    Provider: Dr. Alen Blew   Procedure: IV Feraheme   Note: Patient received infusion of IV Feraheme. Patient tolerated infusion well with no adverse reaction. Monitored patient for 30 minutes after infusion. Vital signs stable. Discharge instructions given. Patient alert, oriented and ambulatory at discharge.

## 2018-04-21 DIAGNOSIS — G44209 Tension-type headache, unspecified, not intractable: Secondary | ICD-10-CM | POA: Diagnosis not present

## 2018-04-21 DIAGNOSIS — G43009 Migraine without aura, not intractable, without status migrainosus: Secondary | ICD-10-CM | POA: Diagnosis not present

## 2018-04-21 DIAGNOSIS — D509 Iron deficiency anemia, unspecified: Secondary | ICD-10-CM | POA: Diagnosis not present

## 2018-04-22 MED FILL — OMEPRAZOLE 40 MG CPDR: 40 | 30 days supply | Qty: 60 | Fill #0

## 2018-04-24 MED FILL — CARAFATE 1 GM/10 ML SUSP: 1 | 30 days supply | Qty: 600 | Fill #0

## 2018-05-12 ENCOUNTER — Encounter: Payer: Self-pay | Admitting: Neurology

## 2018-05-12 ENCOUNTER — Ambulatory Visit (INDEPENDENT_AMBULATORY_CARE_PROVIDER_SITE_OTHER): Payer: PPO | Admitting: Neurology

## 2018-05-12 VITALS — BP 113/74 | HR 76 | Ht 67.5 in | Wt 224.0 lb

## 2018-05-12 DIAGNOSIS — D508 Other iron deficiency anemias: Secondary | ICD-10-CM

## 2018-05-12 DIAGNOSIS — R079 Chest pain, unspecified: Secondary | ICD-10-CM | POA: Diagnosis not present

## 2018-05-12 DIAGNOSIS — K219 Gastro-esophageal reflux disease without esophagitis: Secondary | ICD-10-CM | POA: Diagnosis not present

## 2018-05-12 DIAGNOSIS — D649 Anemia, unspecified: Secondary | ICD-10-CM | POA: Insufficient documentation

## 2018-05-12 DIAGNOSIS — G44019 Episodic cluster headache, not intractable: Secondary | ICD-10-CM

## 2018-05-12 DIAGNOSIS — G4733 Obstructive sleep apnea (adult) (pediatric): Secondary | ICD-10-CM | POA: Diagnosis not present

## 2018-05-12 DIAGNOSIS — Z9989 Dependence on other enabling machines and devices: Secondary | ICD-10-CM | POA: Diagnosis not present

## 2018-05-12 NOTE — Progress Notes (Addendum)
SLEEP MEDICINE CLINIC   Provider:  Larey Seat, M.D.   Referring Provider:  Sarina Ill, M.D.  Primary Care Physician:  Kathyrn Lass, MD  Chief Complaint  Patient presents with  . Follow-up    pt alone, rm 10. pt states that CPAP is working well. DME [facility].    HPI:  Rebecca Marshall is a 67 y.o. female patient , seen in a RV from 05-12-2018,  Rebecca Marshall has been under compliant CPAP patient and has used her machine 90% over 4 hours with an average use at time of 6 hours and 25 not minutes.  Her download was dated 05/06/2018.  Her CPAP is set at 9 cmH2O pressure was 2 cm expiratory pressure relief and her residual AHI is 1.2 which is an excellent resolution.  She does have some air leaks.  These seem to be peaked leads and not interfering with her sleep at night on a chronic or continuing basis. She recently started to wake up with headaches, the headaches do not wake her.  She had a stuffy nose.  Occipital headaches- from headgear ? From wearing it to tight?  Epworth SS at 0/ 24.  The FSS endorsed at 9/ 63 -   Geriatric depression 6 / 15 points - cluster headaches have been controlled, morning headaches are occipital, she has a less frequent Migraines.  Her anemia is improved , the cause is still not known. Hiatal hernia repair is scheduled for 06-05-2018 ( Dr. Catalina Antigua Martin/ dr. Hinton Rao ), she has to take a PPI and she has nausea and abdominal pain, indigestion.  Patient was seen here as a patient of Dr. Ferdinand Lango and has seen Dr. Jannifer Franklin, with headaches. She has undergone a sleep evaluation in 2015-16 and wonders if her CPAP contributes to the problem. Rebecca Marshall underwent a sleep study evaluation on 10/23/2014, at the time endorsing the Epworth sleepiness score at 14 points and the depression inventory at 7 points. She was diagnosed with an AHI of 20.2 the lowest oxygen saturation was 83% with 190 minutes of desaturation time she did not produce any tachycardia or bradycardia arrhythmia.  She was titrated to 8 cm water was 1 cm flex function and was placed on a pico Respironics mask medium size the AHI at this pressure was 0.0 and she slept 56 minutes with a machine at this pressure. She had previously seen Dr. Jaynee Eagles for memory loss and a sleep evaluation was also necessitated by her frequent headaches. Dr. Jaynee Eagles ordered an MRI of the brain, a neuropsychological evaluation, initiated a dementia workup and had ordered a sleep study she also order an EEG which was interpreted by Dr. Jannifer Franklin. In the meantime she has 100% compliance for days of CPAP use and 80% compliance for over 4 hours of nightly use. Average user time is 6 hours. The machine is currently set at 9 cm water pressure was 1 cm EPR she has minor air leaks and an excellent resolution of apnea, the residual AHI is 1.6. 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  Given that she had prolonged period of sleep hypoxemia and still has a headache while using CPAP it may be worthwhile for [place] to check her oxygen level. She describes her headaches as daily and is quite discouraged. The geriatric depression score today was endorsed at 6 points at Epworth sleepiness score at 7 points fatigue severity score  at 58 points. The patient takes vitamin B supplements occasionally vitamin D supplements. No iron or multimineral supplement.  she wakes with headaches, often woken by headaches as early 3.30 AM and nausea and photophobia are present, occipital and behind the right eye. This stabbing, sharp point with tearing of the right eye- CLUSTER? She has begun taking naps daily- this is new.   Sleep habits are as follows: 10 PM , goes to sleep within 5-10 minutes, with CPAP, she has 3-4 bathroom breaks, wakes every morning with headaches, at 6 AM, sleeps on 1 pillow, The bedroom is cool and quiet and dark. Her husband sleeps in another house, across the street. She just  got married, the households are not combined yet.  Interval history from 03/06/2016, Rebecca Marshall is here today and reports that she suffers from more depression, pretty severe depression cycle. She reports daily headaches excessive fatigue she has been in the process of weaning off Topamax, and she has returned oxygen supplement as it does not help her cluster headaches.  Her CPAP use is excellent daily user time is 7 hours and 24 minutes she is 93% compliant CPAP is set at 9 cm water was 1 cm EPR she has a low leak and a residual AHI of 3.9. Fatigue severity is endorsed at 59 points at Epworth sleepiness scale at 7 points. I think this is mostly depression related.  History from 03/24/2017. Rebecca Marshall has moved to a new residence and in the space has developed more allergic rhinitis upper respiratory infection. She reports on the other hand that her headaches have much improved since identified as cluster headaches, she is continuously using CPAP and topiramate 50 mg daily, but her CPAP compliance was a little decreased this year at 53%. I encouraged her to take Benadryl or another antihistamine to allow her to sleep in her bedroom until its renovated she should be able to use the machine for 6 hours each night. She has severe GERD, has been on PPI for 25 years now.  Epworth sleepiness score was endorsed at 9 points, fatigue severity at 42 points and the geriatric depression score at 0 out of 15 points. Please note that the patient is a legal guardian for her nephew.    Review of Systems: Out of a complete 14 system review, the patient complains of only the following symptoms, and all other reviewed systems are negative.; see above    Fatigue severity is endorsed at 9/ 63 from 59 points , the  Epworth sleepiness scale at zero/  from 7 points.  I think this is mostly related to allergic reactions, nasal congestion and GERD. Encourage continuous CPAP use.      Social History   Socioeconomic  History  . Marital status: Married    Spouse name: [name]  . Number of children: 3  . Years of education: 12+  . Highest education level: Not on file  Occupational History  . Occupation: unemployed   Social Needs  . Financial resource strain: Not on file  . Food insecurity:    Worry: Not on file    Inability: Not on file  . Transportation needs:    Medical: Not on file    Non-medical: Not on file  Tobacco Use  . Smoking status: Never Smoker  . Smokeless tobacco: Never Used  Substance and Sexual Activity  . Alcohol use: No    Alcohol/week: 0.0 standard drinks    Comment: none  . Drug use: No  .  Sexual activity: Not on file  Lifestyle  . Physical activity:    Days per week: Not on file    Minutes per session: Not on file  . Stress: Not on file  Relationships  . Social connections:    Talks on phone: Not on file    Gets together: Not on file    Attends religious service: Not on file    Active member of club or organization: Not on file    Attends meetings of clubs or organizations: Not on file    Relationship status: Not on file  . Intimate partner violence:    Fear of current or ex partner: Not on file    Emotionally abused: Not on file    Physically abused: Not on file    Forced sexual activity: Not on file  Other Topics Concern  . Not on file  Social History Narrative   Patient lives at home alone    Patient is married    Patient is not working    Patient has 3 children    Patient has her BSN RN   Patient is right handed       Daily caffeine use 1 cup daily.    Family History  Problem Relation Age of Onset  . Cancer Other   . Alcohol abuse Mother   . Cancer Mother 22       Lung   . Alcohol abuse Father   . Cancer Sister 72       Breast    Past Medical History:  Diagnosis Date  . Acne nodule 11/26/2011  . ADENOCARCINOMA, THYROID GLAND, PAPILLARY 05/17/2009   04/2009 Two surgeries for thyroid cancer Path report from last surgery :THYROID, LEFT,  HEMITHYROIDECTOMY: - MULTIPLE FOCI OF FOLLICULAR VARIANT OR PAPILLARY THYROID CARCINOMA, 1.7 CM IN GREATEST DIMENSION, CONFINED WITHIN THE THYROID TISSUE. - ANGIOLYMPHATIC INVASION PRESENT. - ISTHMUS RESECTION MARGIN INVOLVED BY TUMOR. - FOLLICULAR ADENOMA(S) WITH ASSOCIATED LYMPHOCYTIC THYROIDITIS.     . AK (actinic keratosis) 04/16/2012  . ALLERGIC RHINITIS 01/30/2009   Qualifier: Diagnosis of  By: Andria Frames MD, Gwyndolyn Saxon    . Anxiety   . ANXIETY DEPRESSION 09/03/2010   Long standing depression with anxiety. Good response to medications initially. Has been to [facility].    Marland Kitchen Arthritis   . 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 08/11/2007   LEFT QVZ:DGLOV hip replacement (Dr Berenice Primas) with recalled hardware. Had revision by Dr Berenice Primas in 2011 with  ASR .Marland Kitchen    Marland Kitchen Atrial dysrhythmia    FOLLOWED BY DR Caryl Comes WAS HAVING PALPITATIONS AND SYNCOPAL EPISODES ONSET 9 YEARS AGO ; STARTED ON FLECAIINIDE , PER PATIENT TOLERATES WELL, LAST SYNCOPAL EPISODE WAS OVER 5 YEARS AGO ; SEE LOV IN  EPIC   . Atrial tachycardia ([place]) 06/05/2011   \Treated with flecainide-2011 2010-QRS duration 88 ms 2011 QRS duration 88 ms 2014 QRS duration 98 ms 2014 QRS 120mg    120 ms    . Bilateral bunions 12/18/2010  . Bilateral knee pain 06/29/2015  . Cancer [facility])    thyroid-status post resection on replacement Dr. Chalmers Cater  . Clinical depression 03/06/2016  . Cognitive changes 10/12/2014  . Complication of anesthesia    FENTANYL CAUSED ANAPHYLAXIS DURING CA COLONOSCOPY  . Depression   . Dysrhythmia    Dr Caryl Comes monitoring patient, atrial tachycardia under control, cardiac clearance 09/10/17  . Family history of diabetes mellitus (DM) 05/11/2014  . Fibromyalgia   . Gait abnormality 12/18/2010  . GE reflux   .  GERD 07/14/2007   Qualifier: Diagnosis of  By: Nori Riis MD, Clarise Cruz    . Headache    migraines  . Headache upon awakening 06/05/2011  . Herniated lumbar disc without myelopathy 12/10/2013   Had surgery by neurosurgery in December, 2014.L4-5  laminotomy and microdiscectomy Dr Margreta Journey Done at [facility]   . Hip pain, chronic 04/05/2015   Status post THR plus revision.   . Hypoxemia 01/30/2015  . Memory change 05/12/2014   Reports problems with short-term memory, focusing, concentration in the last 6-12 months. Problems were significant enough that she had to quit her part-time job is fairly distressed about this.   . Metatarsalgia 12/18/2010  . Nausea & vomiting 06/05/2011  . Nocturia more than twice per night 01/30/2015  . Nonallopathic lesion of cervical region 11/26/2011  . Nonallopathic lesion of lumbosacral region 11/26/2011  . OSA on CPAP 01/30/2015  . Osteoporosis   . PONV (postoperative nausea and vomiting)    did not have nausea/vomiting with nissen fundoplication done 06/1750 at [facility]  . Poor compliance with CPAP treatment 03/24/2017  . Rash and nonspecific skin eruption 01/09/2016    located in the 10 o'clock position on Right breast    . Severe obesity (BMI >= 40) ([place]) 01/30/2015  . Sleep apnea 10/2015   x 1 year, CPAP  . Snorings 01/30/2015  . Status post Nissen fundoplication 10/28/8525  . Subacute cough 03/27/2012  . Thyroid disease   . Well adult health check 06/05/2011    Past Surgical History:  Procedure Laterality Date  . ABDOMINAL HYSTERECTOMY    . BREAST EXCISIONAL BIOPSY Right 25+ yrs ago   benign  . caroitid dopplers Bilateral 11/2015   normal  . CATARACT EXTRACTION Bilateral 09/05/15, 09/12/15  . CHOLECYSTECTOMY    . COLONOSCOPY  04/2011  . ESOPHAGEAL MANOMETRY N/A 03/31/2017   Procedure: ESOPHAGEAL MANOMETRY (EM);  Surgeon: Ronald Lobo, MD;  Location: [facility];  Service: Endoscopy;  Laterality: N/A;  . LAPAROSCOPIC NISSEN FUNDOPLICATION N/A 78/10/4233   Procedure: LAPAROSCOPIC NISSEN FUNDOPLICATION;  Surgeon: Johnathan Hausen, MD;  Location: [facility] ORS;  Service: General;  Laterality: N/A;  . THYROIDECTOMY  04/2009   papillary carc[place]o Dr. Erik Obey  . TOE SURGERY     bone spur removed - rt  great toe  . TOTAL HIP ARTHROPLASTY  2009 and 2011   L hip   . TOTAL HIP ARTHROPLASTY Right 09/12/2017   Procedure: TOTAL HIP ARTHROPLASTY ANTERIOR APPROACH;  Surgeon: Dorna Leitz, MD;  Location: [place];  Service: Orthopedics;  Laterality: Right;  . TUBAL LIGATION      Current Outpatient Medications  Medication Sig Dispense Refill  . acetaminophen (TYLENOL) 500 MG tablet Take 1,000 mg by mouth every 8 (eight) hours as needed for mild pain or moderate pain.     . cetirizine (ZYRTEC) 10 MG tablet Take 10 mg by mouth daily as needed for allergies.     . cyclobenzaprine (FLEXERIL) 10 MG tablet Take 1 tablet (10 mg total) by mouth 3 (three) times daily as needed for muscle spasms. (Patient taking differently: Take 10 mg by mouth 2 (two) times daily as needed for muscle spasms. ) 40 tablet 0  . cycloSPORINE (RESTASIS) 0.05 % ophthalmic emulsion Place 1 drop into both eyes 4 (four) times daily as needed.     . eletriptan (RELPAX) 40 MG tablet Take by mouth one at the beginning of headache and may repeat once in 2  Hours for maximum of 2 tabs in 24hours (  Patient taking differently: Take 40 mg by mouth every 2 (two) hours as needed for migraine or headache. ) 27 tablet 3  . flecainide (TAMBOCOR) 150 MG tablet Take 1 tablet (150 mg total) by mouth 2 (two) times daily. 180 tablet 2  . levothyroxine (SYNTHROID, LEVOTHROID) 137 MCG tablet Take 137 mcg by mouth daily before breakfast.    . ondansetron (ZOFRAN ODT) 4 MG disintegrating tablet 4mg  ODT q4 hours prn nausea/vomit 30 tablet 1  . prochlorperazine (COMPAZINE) 10 MG tablet Take 1 tablet (10 mg total) by mouth every 6 (six) hours as needed for nausea or vomiting (Use for nausea and / or vomiting unresolved with ondansetron (Zofran).). (Patient taking differently: Take 10 mg by mouth every 6 (six) hours as needed for nausea or vomiting (Use for nausea and / or vomiting unresolved with ondansetron (Zofran).). ) 20 tablet 1  . topiramate (TOPAMAX) 25 MG  tablet TAKE 2 TABLETS AT BEDTIME (Patient taking differently: 25 mg 2 (two) times daily. ) 180 tablet 0  . traZODone (DESYREL) 100 MG tablet Take 2 tablets (200 mg total) by mouth at bedtime. (Patient taking differently: Take 300 mg by mouth at bedtime. ) 180 tablet 2  . triamcinolone cream (KENALOG) 0.1 % Apply 1 application topically 2 (two) times daily as needed (for ezcema).     No current facility-administered medications for this visit.     Allergies as of 05/12/2018 - Review Complete 05/12/2018  Allergen Reaction Noted  . Nsaids Other (See Comments) 11/14/2014  . Codeine Nausea And Vomiting 09/25/2012  . Fentanyl Other (See Comments) 09/25/2012    Vitals: BP 113/74   Pulse 76   Ht 5' 7.5" (1.715 m)   Wt 224 lb (101.6 kg)   BMI 34.57 kg/m  Last Weight:  Wt Readings from Last 1 Encounters:  05/12/18 224 lb (101.6 kg)   EZM:OQHU mass index is 34.57 kg/m.     Last Height:   Ht Readings from Last 1 Encounters:  05/12/18 5' 7.5" (1.715 m)    Physical exam:  General: The patient is awake, alert and appears not in acute distress. The patient is well groomed. Head: Normocephalic, atraumatic. Neck is supple. Mallampati 4,  neck circumference:15. Nasal airflow unrestricted ,  Cardiovascular:  Regular rate and rhythm, without  murmurs or carotid bruit, and without distended neck veins. Respiratory: Lungs are clear to auscultation. Skin:  Without evidence of edema, or rash Trunk: BMI is elevated . The patient's posture is erect  Neurologic exam : The patient is awake and alert, oriented to place and time.    Memory testing deferred in 03-24-2017  MOCA: [place] Cognitive Assessment  10/12/2014  Visuospatial/ Executive (0/5) 5  Naming (0/3) 3  Attention: Read list of digits (0/2) 2  Attention: Read list of letters (0/1) 1  Attention: Serial 7 subtraction starting at 100 (0/3) 3  Language: Repeat phrase (0/2) 2  Language : Fluency (0/1) 1  Abstraction (0/2) 2  Delayed  Recall (0/5) 5  Orientation (0/6) 6  Total 30   MMSE:No flowsheet data found. Attention span & concentration ability appears normal.  Speech is fluent,  without  dysarthria, dysphonia or aphasia.  Mood and affect are appropriate.  Cranial nerves: Pupils are equal and briskly reactive to light. Extraocular movements  in vertical and horizontal planes intact and without nystagmus. Visual fields by finger perimetry are intact. Hearing to finger rub intact.  Facial motor strength is symmetric and tongue and uvula move midline. Shoulder shrug was  symmetrical.  Motor exam: Normal tone, muscle bulk and symmetric strength in all extremities.   The patient was advised of the nature of the diagnosed sleep disorder , the treatment options and risks for general a health and wellness arising from not treating the condition.  I spent more than 20 minutes of face to face time with the patient. Greater than 50% of time was spent in counseling and coordination of care. We have discussed the diagnosis and differential and I answered the patient's questions.     Assessment:  After physical and neurologic examination, review of laboratory studies,  Personal review of imaging studies, reports of other /same  Imaging studies ,  Results of polysomnography/ neurophysiology testing and pre-existing records as far as provided in visit., my assessment is    1) OSA- improved her compliance with CPAP. Her machine is 19.66  years old. Mrs. Schexnayder has had severe respiratory allergies until she moved into a new home with hardwood floors. daily compliance with CPAP was 93% - CPAP should help with allergic responses, as it is filtered air.  2) Rebecca Marshall headaches has improved, not longer suffering the same cluster headaches, she wakes no longer up from or  with headaches which are occipital and temporal located she also has another quality of headaches a sharp stabbing sensation to the right eye and this degree of headaches can  wake her from sleep. Topiramate at 50 mg once a day. Since hypoxemia or hypercapnia  are not playing a role in her headaches , I will defer to Dr. Jaynee Eagles.   3) Obesity -  Eating disorder, hiatal hernia causes indigestion, abdominal pain, and awaiting surgical repair. I deferred  to [place] counseling for depression/ eating disorder treatment.    4) Anemia of unknown origin.  Iron Iv worked.  Ferritin level was 9 and bumped up to 27 ( still under 50 !) after iv iron, TIBC and iron sat were in low normal limits.    Plan:  Treatment plan and additional workup :  Yearly follow up for CPAP. Patient is due for a new CPAP - DME is [facility].  SPLIT night polysomnography [employer], 3 % split at AHI 10, Co2 needed. She no longer uses oxygen at home.   Will order auto- titration capable CPAP machine with a middle pressure point at 9 cm water pressure after retitration.     Rebecca Partridge Wolfgang Finigan MD  05/12/2018  CC; Sarina Ill, M.D.   Farrel Conners   CC: Kathyrn Lass, [address] [place], [place] [id number]

## 2018-05-12 NOTE — Patient Instructions (Signed)
Anemia Anemia is a condition in which you do not have enough red blood cells or hemoglobin. Hemoglobin is a substance in red blood cells that carries oxygen. When you do not have enough red blood cells or hemoglobin (are anemic), your body cannot get enough oxygen and your organs may not work properly. As a result, you may feel very tired or have other problems. What are the causes? Common causes of anemia include:  Excessive bleeding. Anemia can be caused by excessive bleeding inside or outside the body, including bleeding from the intestine or from periods in women.  Poor nutrition.  Long-lasting (chronic) kidney, thyroid, and liver disease.  Bone marrow disorders.  Cancer and treatments for cancer.  HIV (human immunodeficiency virus) and AIDS (acquired immunodeficiency syndrome).  Treatments for HIV and AIDS.  Spleen problems.  Blood disorders.  Infections, medicines, and autoimmune disorders that destroy red blood cells.  What are the signs or symptoms? Symptoms of this condition include:  Minor weakness.  Dizziness.  Headache.  Feeling heartbeats that are irregular or faster than normal (palpitations).  Shortness of breath, especially with exercise.  Paleness.  Cold sensitivity.  Indigestion.  Nausea.  Difficulty sleeping.  Difficulty concentrating.  Symptoms may occur suddenly or develop slowly. If your anemia is mild, you may not have symptoms. How is this diagnosed? This condition is diagnosed based on:  Blood tests.  Your medical history.  A physical exam.  Bone marrow biopsy.  Your health care provider may also check your stool (feces) for blood and may do additional testing to look for the cause of your bleeding. You may also have other tests, including:  Imaging tests, such as a CT scan or MRI.  Endoscopy.  Colonoscopy.  How is this treated? Treatment for this condition depends on the cause. If you continue to lose a lot of blood,  you may need to be treated at a hospital. Treatment may include:  Taking supplements of iron, vitamin B12, or folic acid.  Taking a hormone medicine (erythropoietin) that can help to stimulate red blood cell growth.  Having a blood transfusion. This may be needed if you lose a lot of blood.  Making changes to your diet.  Having surgery to remove your spleen.  Follow these instructions at home:  Take over-the-counter and prescription medicines only as told by your health care provider.  Take supplements only as told by your health care provider.  Follow any diet instructions that you were given.  Keep all follow-up visits as told by your health care provider. This is important. Contact a health care provider if:  You develop new bleeding anywhere in the body. Get help right away if:  You are very weak.  You are short of breath.  You have pain in your abdomen or chest.  You are dizzy or feel faint.  You have trouble concentrating.  You have bloody or black, tarry stools.  You vomit repeatedly or you vomit up blood. Summary  Anemia is a condition in which you do not have enough red blood cells or enough of a substance in your red blood cells that carries oxygen (hemoglobin).  Symptoms may occur suddenly or develop slowly.  If your anemia is mild, you may not have symptoms.  This condition is diagnosed with blood tests as well as a medical history and physical exam. Other tests may be needed.  Treatment for this condition depends on the cause of the anemia. This information is not intended to replace advice   given to you by your health care provider. Make sure you discuss any questions you have with your health care provider. Document Released: 10/17/2004 Document Revised: 10/11/2016 Document Reviewed: 10/11/2016 Elsevier Interactive Patient Education  [employer].

## 2018-05-21 DIAGNOSIS — K449 Diaphragmatic hernia without obstruction or gangrene: Secondary | ICD-10-CM | POA: Diagnosis not present

## 2018-05-21 DIAGNOSIS — D509 Iron deficiency anemia, unspecified: Secondary | ICD-10-CM | POA: Diagnosis not present

## 2018-05-21 DIAGNOSIS — R197 Diarrhea, unspecified: Secondary | ICD-10-CM | POA: Diagnosis not present

## 2018-05-21 DIAGNOSIS — R1013 Epigastric pain: Secondary | ICD-10-CM | POA: Diagnosis not present

## 2018-05-27 ENCOUNTER — Ambulatory Visit (INDEPENDENT_AMBULATORY_CARE_PROVIDER_SITE_OTHER): Payer: BLUE CROSS/BLUE SHIELD | Admitting: Neurology

## 2018-05-27 DIAGNOSIS — Z9989 Dependence on other enabling machines and devices: Principal | ICD-10-CM

## 2018-05-27 DIAGNOSIS — D508 Other iron deficiency anemias: Secondary | ICD-10-CM

## 2018-05-27 DIAGNOSIS — K219 Gastro-esophageal reflux disease without esophagitis: Secondary | ICD-10-CM

## 2018-05-27 DIAGNOSIS — R079 Chest pain, unspecified: Secondary | ICD-10-CM

## 2018-05-27 DIAGNOSIS — G44019 Episodic cluster headache, not intractable: Secondary | ICD-10-CM

## 2018-05-27 DIAGNOSIS — G4733 Obstructive sleep apnea (adult) (pediatric): Secondary | ICD-10-CM | POA: Diagnosis not present

## 2018-05-27 MED FILL — AMOXICILLIN 500 MG CAPSULE: 500 | 7 days supply | Qty: 30 | Fill #0

## 2018-05-27 MED FILL — AMOXICILLIN 500 MG CAPSULE: 500 | 10 days supply | Qty: 30 | Fill #0

## 2018-05-29 NOTE — Patient Instructions (Addendum)
Rebecca Marshall  05/29/2018   Your procedure is scheduled on: 06-05-18     Report to [facility] Main  Entrance    Report to Admitting at 5:30 AM    Call this number if you have problems the morning of surgery [phone]   Remember: Do not eat food or drink liquids :After Midnight.     Take these medicines the morning of surgery with A SIP OF WATER: Levothyroxine (Synthroid), Sertraline (Zoloft), Flecainide (Tambocor) and Topiramate (Topamax)                                You may not have any metal on your body including hair pins and              piercings  Do not wear jewelry, make-up, lotions, powders or perfumes, deodorant             Do not wear nail polish.  Do not shave  48 hours prior to surgery.                 Do not bring valuables to the hospital. [place].  Contacts, dentures or bridgework may not be worn into surgery.  Leave suitcase in the car. After surgery it may be brought to your room.       Special Instructions: N/A              Please read over the following fact sheets you were given: _____________________________________________________________________             [facility] - Preparing for Surgery Before surgery, you can play an important role.  Because skin is not sterile, your skin needs to be as free of germs as possible.  You can reduce the number of germs on your skin by washing with CHG (chlorahexidine gluconate) soap before surgery.  CHG is an antiseptic cleaner which kills germs and bonds with the skin to continue killing germs even after washing. Please DO NOT use if you have an allergy to CHG or antibacterial soaps.  If your skin becomes reddened/irritated stop using the CHG and inform your nurse when you arrive at Short Stay. Do not shave (including legs and underarms) for at least 48 hours prior to the first CHG shower.  You may shave your face/neck. Please follow  these instructions carefully:  1.  Shower with CHG Soap the night before surgery and the  morning of Surgery.  2.  If you choose to wash your hair, wash your hair first as usual with your  normal  shampoo.  3.  After you shampoo, rinse your hair and body thoroughly to remove the  shampoo.                           4.  Use CHG as you would any other liquid soap.  You can apply chg directly  to the skin and wash                       Gently with a scrungie or clean washcloth.  5.  Apply the CHG Soap to your body ONLY FROM THE NECK DOWN.   Do not  use on face/ open                           Wound or open sores. Avoid contact with eyes, ears mouth and genitals (private parts).                       Wash face,  Genitals (private parts) with your normal soap.             6.  Wash thoroughly, paying special attention to the area where your surgery  will be performed.  7.  Thoroughly rinse your body with warm water from the neck down.  8.  DO NOT shower/wash with your normal soap after using and rinsing off  the CHG Soap.                9.  Pat yourself dry with a clean towel.            10.  Wear clean pajamas.            11.  Place clean sheets on your bed the night of your first shower and do not  sleep with pets. Day of Surgery : Do not apply any lotions/deodorants the morning of surgery.  Please wear clean clothes to the hospital/surgery center.  FAILURE TO FOLLOW THESE INSTRUCTIONS MAY RESULT IN THE CANCELLATION OF YOUR SURGERY PATIENT SIGNATURE_________________________________  NURSE SIGNATURE__________________________________  ________________________________________________________________________

## 2018-05-29 NOTE — Progress Notes (Signed)
09-10-17 (Epic) EKG

## 2018-06-01 ENCOUNTER — Encounter (HOSPITAL_COMMUNITY): Payer: Self-pay

## 2018-06-01 ENCOUNTER — Telehealth: Payer: Self-pay | Admitting: Internal Medicine

## 2018-06-01 ENCOUNTER — Encounter (HOSPITAL_COMMUNITY)
Admission: RE | Admit: 2018-06-01 | Discharge: 2018-06-01 | Disposition: A | Discharge status: Home / Self Care | Payer: BLUE CROSS/BLUE SHIELD | Source: Ambulatory Visit | Attending: Surgery | Admitting: Surgery

## 2018-06-01 ENCOUNTER — Other Ambulatory Visit: Payer: Self-pay | Admitting: *Deleted

## 2018-06-01 ENCOUNTER — Other Ambulatory Visit: Payer: Self-pay

## 2018-06-01 DIAGNOSIS — Z01812 Encounter for preprocedural laboratory examination: Secondary | ICD-10-CM | POA: Diagnosis not present

## 2018-06-01 LAB — BASIC METABOLIC PANEL
Anion gap: 9 (ref 5–15)
BUN: 16 mg/dL (ref 8–23)
CO2: 26 mmol/L (ref 22–32)
Calcium: 8.8 mg/dL — ABNORMAL LOW (ref 8.9–10.3)
Chloride: 107 mmol/L (ref 98–111)
Creatinine, Ser: 0.76 mg/dL (ref 0.44–1.00)
GFR calc Af Amer: >60 mL/min (ref >60)
GFR calc non Af Amer: >60 mL/min (ref >60)
Glucose, Bld: 120 mg/dL — ABNORMAL HIGH (ref 70–99)
Potassium: 3.6 mmol/L (ref 3.5–5.1)
Sodium: 142 mmol/L (ref 135–145)

## 2018-06-01 LAB — CBC
HCT: 39.7 % (ref 36.0–46.0)
Hemoglobin: 12.7 g/dL (ref 12.0–15.0)
MCH: 30.3 pg (ref 26.0–34.0)
MCHC: 32 g/dL (ref 30.0–36.0)
MCV: 94.7 fL (ref 78.0–100.0)
Platelets: 241 10*3/uL (ref 150–400)
RBC: 4.19 MIL/uL (ref 3.87–5.11)
RDW: 17 % — ABNORMAL HIGH (ref 11.5–15.5)
WBC: 5.4 10*3/uL (ref 4.0–10.5)

## 2018-06-01 MED ORDER — FLECAINIDE ACETATE 150 MG PO TABS: 150.0000 mg | ORAL_TABLET | Freq: Two times a day (BID) | ORAL | 0 refills | Status: DC

## 2018-06-01 NOTE — Telephone Encounter (Signed)
New message:       *STAT* If patient is at the pharmacy, call can be transferred to refill team.   1. Which medications need to be refilled? (please list name of each medication and dose if known) flecainide (TAMBOCOR) 150 MG tablet  2. Which pharmacy/location (including street and city if local pharmacy) is medication to be sent to?[facility] [place], [place]  3. Do they need a 30 day or 90 day supply? [place]

## 2018-06-03 NOTE — Procedures (Signed)
PATIENT'S NAME:  Rebecca Marshall, [place] DOB:      Jul 17, 1951      MR#:    [id number]     DATE OF RECORDING: 05/27/2018, Gayla Doss M.D.:  Sarina Ill, MD Study Performed:   Baseline Polysomnogram HISTORY:  Mrs. Westlynn Fifer is a 67 year old headache patient and has been a compliant CPAP user. Her download was dated 05/06/2018.  Her CPAP is set at 9 cmH2O pressure was 2 cm expiratory pressure relief and her residual AHI is 1.2 which is an excellent resolution.  She recently started to wake up with headaches, the headaches do not wake her. She had a stuffy nose.  Occipital headaches- from headgear? From wearing it to tight? Given that she had prolonged period of sleep hypoxemia beforeand still has a daily headache while using CPAP, it may be worthwhile for [place] to check her oxygen level. The patient endorsed the Epworth Sleepiness Scale at 7 points.  The patient's weight 224 pounds with a height of 68 (inches), resulting in a BMI of 34.9 kg/m2.The patient's neck circumference measured 15 inches.  CURRENT MEDICATIONS: CPAP 9 cm, Tylenol, Zyrtec, Flexeril, Restasis, Relpax, Tambocor, Synthroid, Zofran, Compazine, Topamax, Desyrel, Kenalog   PROCEDURE:  This is a multichannel digital polysomnogram utilizing the Somnostar 11.2 system.  Electrodes and sensors were applied and monitored per AASM Specifications.   EEG, EOG, Chin and Limb EMG, were sampled at 200 Hz.  ECG, Snore and Nasal Pressure, Thermal Airflow, Respiratory Effort, CPAP Flow and Pressure, Oximetry was sampled at 50 Hz. Digital video and audio were recorded.      BASELINE STUDY: Lights Out was at 22:14 and Lights On at 05:02.  Total recording time (TRT) was 409 minutes, with a total sleep time (TST) of 346 minutes.   The patient's sleep latency was 19 minutes.  REM latency was 244 minutes.  The sleep efficiency was 84.6 %.     SLEEP ARCHITECTURE: WASO (Wake after sleep onset) was 54 minutes.  There were 10 minutes in Stage N1, 126 minutes Stage N2,  143.5 minutes Stage N3 and 66.5 minutes in Stage REM.  The percentage of Stage N1 was 2.9%, Stage N2 was 36.4%, Stage N3 was 41.5% and Stage R (REM sleep) was 19.2%.  RESPIRATORY ANALYSIS:  There were a total of 8 respiratory events:  2 obstructive apneas, 6 hypopneas with 0 respiratory event related arousals (RERAs).The total APNEA/HYPOPNEA INDEX (AHI) was 1.4 /hour and the total RESPIRATORY DISTURBANCE INDEX was 1.4 /hour.   1 event occurred in REM sleep and 10 events in NREM. The REM AHI was 0.9 /hour, versus a non-REM AHI of 1.5. The patient spent 239.5 minutes of total sleep time in the supine position and 107 minutes in non-supine. The supine AHI was 1.8 versus a non-supine AHI of 0.6.  OXYGEN SATURATION & C02:  The Wake baseline 02 saturation was 92%, with the lowest being 79%. Time spent below 89% saturation equaled 134 minutes.   AROUSALS/ PERIODIC LIMB MOVEMENTS:  The arousals were noted as: 39 were spontaneous, 0 were associated with PLMs, and 5 were associated with respiratory events. The patient had a total of 0 Periodic Limb Movements.    Audio and video analysis did not show any abnormal or unusual movements, behaviors, phonations or vocalizations.   Snoring was noted. EKG was in keeping with normal sinus rhythm (NSR). Post-study, the patient indicated that sleep was the same as usual.    IMPRESSION:  1. No evidence of Obstructive Sleep Apnea (  OSA) - CPAP may have helped with control of cluster headaches, but is no longer indicated by AHI.  2. Hypoxemia in REM sleep, still not correlated to OSA and therefore not an indication to continue CPAP.  RECOMMENDATIONS:  1. Advise follow up with discussion of results. I was surprised to see OSA has resolved. Encourage weight loss, exercise regimen and trigger point/ botox evaluation for occipital headaches.      I certify that I have reviewed the entire raw data recording prior to the issuance of this report in accordance with the  Standards of Accreditation of the American Academy of Sleep Medicine (AASM)   Larey Seat, MD    06-03-2018  Diplomat, American Board of Psychiatry and Neurology  Diplomat, American Board of [place] Director, [employer] [facility] [employer]

## 2018-06-04 NOTE — Progress Notes (Signed)
Called [facility] to request orders for 06/05/18 surgery.

## 2018-06-04 NOTE — H&P (Signed)
[name] Location: [place] Office Patient #: [id number] DOB: [birth year] Single / Language: Rebecca Marshall / Race: White Female   History of Present Illness Rodman Key B. Hassell Done MD;  The patient is a 67 year old female who presents with a hiatal hernia. Ms. Knowles had a type III hiatal hernia with GERD and bleeding from [facility] ulcers back in underwent a lap Nissen repair of hiatal hernia by me in October 2018. She subsequently had a hip repaired and no issues with that and then developed dysphagia and now with anemia requiring transfusions of iron. I went over her exam and looks like her hiatus is opened up and she's herniate her stomach above the wrap. She's having dysphagia and anemia and she has pain and wants to have this revised. And indicated that we may not be able to revise it as we did before and discussed how we could diaphragm is some time and is subject to failure. She seemed to understand that they are resolved may not be as good as the first time initially. I'm not sure what caused her to fail. I'll try to get her on as soon as possible electively.    Physical Exam Rodman Key B. Hassell Done MD;  General Note: normally developed white female in no acute distress HEENT exam unremarkable Neck supple without adenopathy Chest is clear Heart sinus rhythm right approximate 88 Abdomen is nontender Extremities prior posterior hip on the left and anterior hip on the right.-Bilateral total hips     Assessment & Plan Rodman Key B. Donyae Kohn MD;  HIATAL HERNIA WITH GERD AND ESOPHAGITIS (K44.9) Impression: recurrent symptomatic hiatal hernia. Revision desired by the patient and discussed with her and she wants to proceed. She is aware that we may not be able to completely get everything out of her chest or get things reduced and may require formal gastropexy or gastrostomy tube.  Matt B. Hassell Done, MD, FACS

## 2018-06-05 ENCOUNTER — Ambulatory Visit (HOSPITAL_COMMUNITY): Payer: BLUE CROSS/BLUE SHIELD | Admitting: Anesthesiology

## 2018-06-05 ENCOUNTER — Inpatient Hospital Stay (HOSPITAL_COMMUNITY)
Admission: AD | Admit: 2018-06-05 | Discharge: 2018-06-06 | DRG: 328 | Disposition: A | Discharge status: Home / Self Care | Payer: BLUE CROSS/BLUE SHIELD | Attending: Surgery | Admitting: Surgery

## 2018-06-05 ENCOUNTER — Encounter (HOSPITAL_COMMUNITY): Payer: Self-pay | Admitting: Emergency Medicine

## 2018-06-05 ENCOUNTER — Other Ambulatory Visit: Payer: Self-pay

## 2018-06-05 ENCOUNTER — Encounter (HOSPITAL_COMMUNITY)
Admission: AD | Disposition: A | Discharge status: Home / Self Care | Payer: Self-pay | Source: Home / Self Care | Attending: Surgery

## 2018-06-05 DIAGNOSIS — Z96649 Presence of unspecified artificial hip joint: Secondary | ICD-10-CM | POA: Diagnosis present

## 2018-06-05 DIAGNOSIS — K210 Gastro-esophageal reflux disease with esophagitis: Secondary | ICD-10-CM | POA: Diagnosis present

## 2018-06-05 DIAGNOSIS — K449 Diaphragmatic hernia without obstruction or gangrene: Principal | ICD-10-CM | POA: Diagnosis present

## 2018-06-05 DIAGNOSIS — K219 Gastro-esophageal reflux disease without esophagitis: Secondary | ICD-10-CM | POA: Diagnosis not present

## 2018-06-05 DIAGNOSIS — Z6833 Body mass index (BMI) 33.0-33.9, adult: Secondary | ICD-10-CM

## 2018-06-05 DIAGNOSIS — K660 Peritoneal adhesions (postprocedural) (postinfection): Secondary | ICD-10-CM | POA: Diagnosis not present

## 2018-06-05 DIAGNOSIS — E669 Obesity, unspecified: Secondary | ICD-10-CM | POA: Diagnosis not present

## 2018-06-05 HISTORY — PX: UPPER GI ENDOSCOPY: SHX6162

## 2018-06-05 HISTORY — PX: HIATAL HERNIA REPAIR: SHX195

## 2018-06-05 LAB — CBC
HCT: 36.5 % (ref 36.0–46.0)
Hemoglobin: 12 g/dL (ref 12.0–15.0)
MCH: 30.6 pg (ref 26.0–34.0)
MCHC: 32.9 g/dL (ref 30.0–36.0)
MCV: 93.1 fL (ref 78.0–100.0)
Platelets: 204 10*3/uL (ref 150–400)
RBC: 3.92 MIL/uL (ref 3.87–5.11)
RDW: 16.7 % — ABNORMAL HIGH (ref 11.5–15.5)
WBC: 11.3 10*3/uL — ABNORMAL HIGH (ref 4.0–10.5)

## 2018-06-05 LAB — CREATININE, SERUM
Creatinine, Ser: 0.71 mg/dL (ref 0.44–1.00)
GFR calc Af Amer: >60 mL/min (ref >60)
GFR calc non Af Amer: >60 mL/min (ref >60)

## 2018-06-05 SURGERY — REPAIR, HERNIA, HIATAL, LAPAROSCOPIC
Anesthesia: General | Site: Abdomen

## 2018-06-05 MED ORDER — FENTANYL CITRATE (PF) 100 MCG/2ML IJ SOLN
INTRAMUSCULAR | Status: DC | PRN
  Administered 2018-06-05: 50 ug via INTRAVENOUS

## 2018-06-05 MED ORDER — ONDANSETRON 4 MG PO TBDP: 4.0000 mg | ORAL_TABLET | Freq: Four times a day (QID) | ORAL | Status: DC | PRN

## 2018-06-05 MED ORDER — OXYCODONE HCL 5 MG PO TABS: 5.0000 mg | ORAL_TABLET | Freq: Once | ORAL | Status: DC | PRN

## 2018-06-05 MED ORDER — LACTATED RINGERS IR SOLN
Status: DC | PRN
  Administered 2018-06-05: 1000 mL

## 2018-06-05 MED ORDER — HYDROMORPHONE HCL 1 MG/ML IJ SOLN
0.5000 mg | INTRAMUSCULAR | Status: DC | PRN
  Administered 2018-06-05 (×3): 1 mg via INTRAVENOUS
  Administered 2018-06-06: 0.5 mg via INTRAVENOUS
  Filled 2018-06-05 (×5): qty 1

## 2018-06-05 MED ORDER — OXYCODONE HCL 5 MG/5ML PO SOLN
5.0000 mg | Freq: Once | ORAL | Status: DC | PRN
  Filled 2018-06-05: qty 5

## 2018-06-05 MED ORDER — ONDANSETRON 4 MG PO TBDP: 4.0000 mg | ORAL_TABLET | ORAL | Status: DC | PRN

## 2018-06-05 MED ORDER — ACETAMINOPHEN 500 MG PO TABS
1000.0000 mg | ORAL_TABLET | ORAL | Status: AC
  Administered 2018-06-05: 1000 mg via ORAL
  Filled 2018-06-05: qty 2

## 2018-06-05 MED ORDER — DEXAMETHASONE SODIUM PHOSPHATE 10 MG/ML IJ SOLN
INTRAMUSCULAR | Status: DC | PRN
  Administered 2018-06-05: 10 mg via INTRAVENOUS

## 2018-06-05 MED ORDER — PROPOFOL 10 MG/ML IV BOLUS
INTRAVENOUS | Status: DC | PRN
  Administered 2018-06-05: 150 mg via INTRAVENOUS

## 2018-06-05 MED ORDER — CEFAZOLIN SODIUM-DEXTROSE 2-4 GM/100ML-% IV SOLN
2.0000 g | INTRAVENOUS | Status: DC
  Filled 2018-06-05: qty 100

## 2018-06-05 MED ORDER — SODIUM CHLORIDE 0.9 % IJ SOLN
INTRAMUSCULAR | Status: AC
  Filled 2018-06-05: qty 10

## 2018-06-05 MED ORDER — MIDAZOLAM HCL 2 MG/2ML IJ SOLN
INTRAMUSCULAR | Status: AC
  Filled 2018-06-05: qty 2

## 2018-06-05 MED ORDER — SERTRALINE HCL 100 MG PO TABS
200.0000 mg | ORAL_TABLET | Freq: Every day | ORAL | Status: DC
  Administered 2018-06-06: 200 mg via ORAL
  Filled 2018-06-05: qty 2

## 2018-06-05 MED ORDER — FLECAINIDE ACETATE 50 MG PO TABS: 150.0000 mg | ORAL_TABLET | Freq: Two times a day (BID) | ORAL | Status: DC

## 2018-06-05 MED ORDER — CYCLOBENZAPRINE HCL 10 MG PO TABS
10.0000 mg | ORAL_TABLET | Freq: Two times a day (BID) | ORAL | Status: DC
  Administered 2018-06-06: 10 mg via ORAL
  Filled 2018-06-05: qty 1

## 2018-06-05 MED ORDER — LIDOCAINE 20MG/ML (2%) 15 ML SYRINGE OPTIME
INTRAMUSCULAR | Status: DC | PRN
  Administered 2018-06-05: 1 mg/kg/h via INTRAVENOUS

## 2018-06-05 MED ORDER — FLECAINIDE ACETATE 50 MG PO TABS
150.0000 mg | ORAL_TABLET | Freq: Two times a day (BID) | ORAL | Status: DC
  Administered 2018-06-05: 150 mg via ORAL
  Filled 2018-06-05 (×2): qty 1

## 2018-06-05 MED ORDER — ONDANSETRON HCL 4 MG/2ML IJ SOLN
INTRAMUSCULAR | Status: DC | PRN
  Administered 2018-06-05: 4 mg via INTRAVENOUS

## 2018-06-05 MED ORDER — BUPIVACAINE LIPOSOME 1.3 % IJ SUSP
INTRAMUSCULAR | Status: DC | PRN
  Administered 2018-06-05: 20 mL

## 2018-06-05 MED ORDER — SODIUM CHLORIDE 0.9 % IV SOLN
INTRAVENOUS | Status: DC | PRN
  Administered 2018-06-05: 20 ug/min via INTRAVENOUS

## 2018-06-05 MED ORDER — FENTANYL CITRATE (PF) 250 MCG/5ML IJ SOLN
INTRAMUSCULAR | Status: AC
  Filled 2018-06-05: qty 5

## 2018-06-05 MED ORDER — 0.9 % SODIUM CHLORIDE (POUR BTL) OPTIME
TOPICAL | Status: DC | PRN
  Administered 2018-06-05: 1000 mL

## 2018-06-05 MED ORDER — FAMOTIDINE IN NACL 20-0.9 MG/50ML-% IV SOLN
20.0000 mg | Freq: Two times a day (BID) | INTRAVENOUS | Status: DC
  Administered 2018-06-05 (×2): 20 mg via INTRAVENOUS
  Filled 2018-06-05 (×2): qty 50

## 2018-06-05 MED ORDER — KCL IN DEXTROSE-NACL 20-5-0.45 MEQ/L-%-% IV SOLN
INTRAVENOUS | Status: DC
  Administered 2018-06-05 – 2018-06-06 (×2): via INTRAVENOUS
  Filled 2018-06-05 (×2): qty 1000

## 2018-06-05 MED ORDER — ONDANSETRON HCL 4 MG/2ML IJ SOLN: 4.0000 mg | Freq: Four times a day (QID) | INTRAMUSCULAR | Status: DC | PRN

## 2018-06-05 MED ORDER — CHLORHEXIDINE GLUCONATE CLOTH 2 % EX PADS: 6.0000 | MEDICATED_PAD | Freq: Once | CUTANEOUS | Status: DC

## 2018-06-05 MED ORDER — BUPIVACAINE LIPOSOME 1.3 % IJ SUSP
20.0000 mL | Freq: Once | INTRAMUSCULAR | Status: DC
  Filled 2018-06-05: qty 20

## 2018-06-05 MED ORDER — HEPARIN SODIUM (PORCINE) 5000 UNIT/ML IJ SOLN
5000.0000 [IU] | Freq: Once | INTRAMUSCULAR | Status: AC
  Administered 2018-06-05: 5000 [IU] via SUBCUTANEOUS
  Filled 2018-06-05: qty 1

## 2018-06-05 MED ORDER — ROCURONIUM BROMIDE 100 MG/10ML IV SOLN
INTRAVENOUS | Status: DC | PRN
  Administered 2018-06-05: 20 mg via INTRAVENOUS
  Administered 2018-06-05: 10 mg via INTRAVENOUS
  Administered 2018-06-05: 70 mg via INTRAVENOUS
  Administered 2018-06-05: 30 mg via INTRAVENOUS

## 2018-06-05 MED ORDER — ALBUMIN HUMAN 5 % IV SOLN
INTRAVENOUS | Status: DC | PRN
  Administered 2018-06-05: 09:00:00 via INTRAVENOUS

## 2018-06-05 MED ORDER — SODIUM CHLORIDE 0.9 % IJ SOLN
INTRAMUSCULAR | Status: DC | PRN
  Administered 2018-06-05: 10 mL via INTRAVENOUS

## 2018-06-05 MED ORDER — TRAZODONE HCL 100 MG PO TABS
200.0000 mg | ORAL_TABLET | Freq: Every day | ORAL | Status: DC
  Administered 2018-06-05: 200 mg via ORAL
  Filled 2018-06-05: qty 2

## 2018-06-05 MED ORDER — LACTATED RINGERS IV SOLN
INTRAVENOUS | Status: DC | PRN
  Administered 2018-06-05 (×3): via INTRAVENOUS

## 2018-06-05 MED ORDER — SUGAMMADEX SODIUM 500 MG/5ML IV SOLN
INTRAVENOUS | Status: DC | PRN
  Administered 2018-06-05: 200 mg via INTRAVENOUS

## 2018-06-05 MED ORDER — LIDOCAINE HCL (CARDIAC) PF 100 MG/5ML IV SOSY
PREFILLED_SYRINGE | INTRAVENOUS | Status: DC | PRN
  Administered 2018-06-05: 100 mg via INTRAVENOUS

## 2018-06-05 MED ORDER — PROPOFOL 10 MG/ML IV BOLUS
INTRAVENOUS | Status: AC
  Filled 2018-06-05: qty 40

## 2018-06-05 MED ORDER — FENTANYL CITRATE (PF) 100 MCG/2ML IJ SOLN
INTRAMUSCULAR | Status: AC
  Filled 2018-06-05: qty 2

## 2018-06-05 MED ORDER — ROCURONIUM BROMIDE 100 MG/10ML IV SOLN: INTRAVENOUS | Status: DC | PRN

## 2018-06-05 MED ORDER — HYDRALAZINE HCL 20 MG/ML IJ SOLN: 10.0000 mg | INTRAMUSCULAR | Status: DC | PRN

## 2018-06-05 MED ORDER — TOPIRAMATE 25 MG PO TABS: 50.0000 mg | ORAL_TABLET | Freq: Every day | ORAL | Status: DC

## 2018-06-05 MED ORDER — FENTANYL CITRATE (PF) 100 MCG/2ML IJ SOLN
12.5000 ug | INTRAMUSCULAR | Status: DC | PRN
  Administered 2018-06-05 (×2): 12.5 ug via INTRAVENOUS
  Filled 2018-06-05 (×2): qty 2

## 2018-06-05 MED ORDER — TOPIRAMATE 25 MG PO TABS
50.0000 mg | ORAL_TABLET | Freq: Every day | ORAL | Status: DC
  Administered 2018-06-05: 50 mg via ORAL
  Filled 2018-06-05: qty 2

## 2018-06-05 MED ORDER — HEPARIN SODIUM (PORCINE) 5000 UNIT/ML IJ SOLN
5000.0000 [IU] | Freq: Three times a day (TID) | INTRAMUSCULAR | Status: DC
  Administered 2018-06-05 – 2018-06-06 (×2): 5000 [IU] via SUBCUTANEOUS
  Filled 2018-06-05 (×2): qty 1

## 2018-06-05 MED ORDER — EPHEDRINE SULFATE 50 MG/ML IJ SOLN
INTRAMUSCULAR | Status: DC | PRN
  Administered 2018-06-05: 5 mg via INTRAVENOUS
  Administered 2018-06-05: 10 mg via INTRAVENOUS
  Administered 2018-06-05 (×3): 5 mg via INTRAVENOUS
  Administered 2018-06-05 (×3): 10 mg via INTRAVENOUS

## 2018-06-05 MED ORDER — FENTANYL CITRATE (PF) 100 MCG/2ML IJ SOLN
25.0000 ug | INTRAMUSCULAR | Status: DC | PRN
  Administered 2018-06-05: 50 ug via INTRAVENOUS

## 2018-06-05 MED ORDER — CYCLOSPORINE 0.05 % OP EMUL: 1.0000 [drp] | Freq: Four times a day (QID) | OPHTHALMIC | Status: DC | PRN

## 2018-06-05 MED ORDER — LEVOTHYROXINE SODIUM 25 MCG PO TABS
137.0000 ug | ORAL_TABLET | Freq: Every day | ORAL | Status: DC
  Administered 2018-06-06: 137 ug via ORAL
  Filled 2018-06-05: qty 1

## 2018-06-05 MED ORDER — GABAPENTIN 300 MG PO CAPS
300.0000 mg | ORAL_CAPSULE | ORAL | Status: AC
  Administered 2018-06-05: 300 mg via ORAL
  Filled 2018-06-05: qty 1

## 2018-06-05 MED ORDER — ONDANSETRON HCL 4 MG/2ML IJ SOLN: 4.0000 mg | Freq: Once | INTRAMUSCULAR | Status: DC | PRN

## 2018-06-05 MED ORDER — MIDAZOLAM HCL 5 MG/5ML IJ SOLN
INTRAMUSCULAR | Status: DC | PRN
  Administered 2018-06-05 (×2): 1 mg via INTRAVENOUS

## 2018-06-05 SURGICAL SUPPLY — 49 items
ADH SKN CLS APL DERMABOND .7 (GAUZE/BANDAGES/DRESSINGS) ×2
APL SKNCLS STERI-STRIP NONHPOA (GAUZE/BANDAGES/DRESSINGS)
APPLIER CLIP ROT 10 11.4 M/L (STAPLE)
APR CLP MED LRG 11.4X10 (STAPLE)
BENZOIN TINCTURE PRP APPL 2/3 (GAUZE/BANDAGES/DRESSINGS) IMPLANT
CABLE HIGH FREQUENCY MONO STRZ (ELECTRODE) IMPLANT
CLIP APPLIE ROT 10 11.4 M/L (STAPLE) IMPLANT
COVER SURGICAL LIGHT HANDLE (MISCELLANEOUS) ×3 IMPLANT
DECANTER SPIKE VIAL GLASS SM (MISCELLANEOUS) ×3 IMPLANT
DERMABOND ADVANCED (GAUZE/BANDAGES/DRESSINGS) ×1
DERMABOND ADVANCED .7 DNX12 (GAUZE/BANDAGES/DRESSINGS) ×2 IMPLANT
DEVICE SUT QUICK LOAD TK 5 (STAPLE) IMPLANT
DEVICE SUT TI-KNOT TK 5X26 (MISCELLANEOUS) IMPLANT
DEVICE SUTURE ENDOST 10MM (ENDOMECHANICALS) ×2 IMPLANT
DISSECTOR BLUNT TIP ENDO 5MM (MISCELLANEOUS) ×3 IMPLANT
DRAIN PENROSE 18X1/2 LTX STRL (DRAIN) ×3 IMPLANT
ELECT PENCIL ROCKER SW 15FT (MISCELLANEOUS) IMPLANT
ELECT REM PT RETURN 15FT ADLT (MISCELLANEOUS) ×3 IMPLANT
GLOVE BIOGEL M 8.0 STRL (GLOVE) ×3 IMPLANT
GOWN STRL REUS W/TWL XL LVL3 (GOWN DISPOSABLE) ×12 IMPLANT
HOLDER FOLEY CATH W/STRAP (MISCELLANEOUS) ×1 IMPLANT
KIT BASIN OR (CUSTOM PROCEDURE TRAY) ×3 IMPLANT
NDL SPNL 22GX3.5 QUINCKE BK (NEEDLE) ×2 IMPLANT
NEEDLE SPNL 22GX3.5 QUINCKE BK (NEEDLE) ×3 IMPLANT
PAD POSITIONING PINK XL (MISCELLANEOUS) IMPLANT
POSITIONER SURGICAL ARM (MISCELLANEOUS) IMPLANT
SCISSORS LAP 5X45 EPIX DISP (ENDOMECHANICALS) ×3 IMPLANT
SCRUB TECHNI CARE 4 OZ NO DYE (MISCELLANEOUS) ×3 IMPLANT
SET IRRIG TUBING LAPAROSCOPIC (IRRIGATION / IRRIGATOR) ×3 IMPLANT
SHEARS HARMONIC ACE PLUS 45CM (MISCELLANEOUS) ×3 IMPLANT
SLEEVE ADV FIXATION 5X100MM (TROCAR) IMPLANT
SOLUTION ANTI FOG 6CC (MISCELLANEOUS) ×2 IMPLANT
STAPLER VISISTAT 35W (STAPLE) ×3 IMPLANT
STRIP CLOSURE SKIN 1/2X4 (GAUZE/BANDAGES/DRESSINGS) IMPLANT
SUT SURGIDAC NAB ES-9 0 48 120 (SUTURE) ×8 IMPLANT
SUT VIC AB 4-0 SH 18 (SUTURE) ×3 IMPLANT
TAPE CLOTH 4X10 WHT NS (GAUZE/BANDAGES/DRESSINGS) IMPLANT
TIP INNERVISION DETACH 40FR (MISCELLANEOUS) IMPLANT
TIP INNERVISION DETACH 50FR (MISCELLANEOUS) IMPLANT
TIP INNERVISION DETACH 56FR (MISCELLANEOUS) IMPLANT
TIPS INNERVISION DETACH 40FR (MISCELLANEOUS)
TOWEL OR 17X26 10 PK STRL BLUE (TOWEL DISPOSABLE) ×3 IMPLANT
TRAY FOLEY CATH 14FRSI W/METER (CATHETERS) ×1 IMPLANT
TRAY FOLEY MTR SLVR 16FR STAT (SET/KITS/TRAYS/PACK) ×2 IMPLANT
TRAY LAPAROSCOPIC (CUSTOM PROCEDURE TRAY) ×3 IMPLANT
TROCAR ADV FIXATION 11X100MM (TROCAR) ×1 IMPLANT
TROCAR ADV FIXATION 5X100MM (TROCAR) IMPLANT
TROCAR BLADELESS OPT 5 100 (ENDOMECHANICALS) ×3 IMPLANT
TUBING INSUF HEATED (TUBING) ×3 IMPLANT

## 2018-06-05 NOTE — Progress Notes (Signed)
Pt's foley leaking, soaked pt in bed.  MD called and orders received.

## 2018-06-05 NOTE — Anesthesia Postprocedure Evaluation (Signed)
Anesthesia Post Note  Patient: Rebecca Marshall  Procedure(s) Performed: LAPAROSCOPIC  ENTERO-LYSIS  ERAS PATHWAY (N/A Abdomen) UPPER GI ENDOSCOPY     Patient location during evaluation: PACU Anesthesia Type: General Level of consciousness: awake and alert Pain management: pain level controlled Vital Signs Assessment: post-procedure vital signs reviewed and stable Respiratory status: spontaneous breathing, nonlabored ventilation, respiratory function stable and patient connected to nasal cannula oxygen Cardiovascular status: blood pressure returned to baseline and stable Postop Assessment: no apparent nausea or vomiting Anesthetic complications: no    Last Vitals:  Vitals:   06/05/18 1230 06/05/18 1334  BP: 114/64 118/68  Pulse: 73 77  Resp: 12 16  Temp:  36.8 C  SpO2: 92% 96%    Last Pain:  Vitals:   06/05/18 1334  TempSrc: Axillary  PainSc:                  [name]

## 2018-06-05 NOTE — Interval H&P Note (Signed)
History and Physical Interval Note:  06/05/2018 7:18 AM  [name]  has presented today for surgery, with the diagnosis of Slipped Nissen  The various methods of treatment have been discussed with the patient and family. After consideration of risks, benefits and other options for treatment, the patient has consented to  Procedure(s): [place] (N/A) as a surgical intervention .  The patient's history has been reviewed, patient examined, no change in status, stable for surgery.  I have reviewed the patient's chart and labs.  Questions were answered to the patient's satisfaction.     Rebecca Marshall

## 2018-06-05 NOTE — Anesthesia Procedure Notes (Signed)
Procedure Name: Intubation Date/Time: 06/05/2018 7:37 AM Performed by: [name], MD Pre-anesthesia Checklist: Patient identified, Emergency Drugs available, Suction available, Patient being monitored and Timeout performed Patient Re-evaluated:Patient Re-evaluated prior to induction Oxygen Delivery Method: Circle system utilized Preoxygenation: Pre-oxygenation with 100% oxygen Induction Type: IV induction Ventilation: Mask ventilation without difficulty Laryngoscope Size: Mac and 3 Grade View: Grade II Tube type: Oral Tube size: 7.5 mm Number of attempts: 1 Airway Equipment and Method: Stylet Placement Confirmation: ETT inserted through vocal cords under direct vision,  positive ETCO2 and breath sounds checked- equal and bilateral Secured at: 21 cm Tube secured with: Tape Dental Injury: Teeth and Oropharynx as per pre-operative assessment

## 2018-06-05 NOTE — Transfer of Care (Signed)
Immediate Anesthesia Transfer of Care Note  Patient: Rebecca Marshall  Procedure(s) Performed: LAPAROSCOPIC  ENTERO-LYSIS  ERAS PATHWAY (N/A Abdomen) UPPER GI ENDOSCOPY  Patient Location: PACU  Anesthesia Type:General  Level of Consciousness: awake, oriented, drowsy and patient cooperative  Airway & Oxygen Therapy: Patient Spontanous Breathing and Patient connected to face mask oxygen  Post-op Assessment: Report given to RN, Post -op Vital signs reviewed and stable and Patient moving all extremities  Post vital signs: Reviewed and stable  Last Vitals:  Vitals Value Taken Time  BP 118/61 06/05/2018 10:45 AM  Temp    Pulse 77 06/05/2018 10:49 AM  Resp 18 06/05/2018 10:49 AM  SpO2 99 % 06/05/2018 10:49 AM  Vitals shown include unvalidated device data.  Last Pain:  Vitals:   06/05/18 0551  TempSrc:   PainSc: 7       Patients Stated Pain Goal: 4 (01/65/53 7482)  Complications: No apparent anesthesia complications

## 2018-06-05 NOTE — Anesthesia Preprocedure Evaluation (Signed)
Anesthesia Evaluation  Patient identified by MRN, date of birth, ID band Patient awake    Reviewed: Allergy & Precautions, NPO status , Patient's Chart, lab work & pertinent test results  History of Anesthesia Complications (+) PONV  Airway Mallampati: II  TM Distance: >3 FB Neck ROM: Full    Dental no notable dental hx.    Pulmonary sleep apnea ,    breath sounds clear to auscultation       Cardiovascular + dysrhythmias  Rhythm:Regular Rate:Normal  Atrial tachycardia controlled with flecainide   Neuro/Psych  Headaches, Anxiety Depression    GI/Hepatic Neg liver ROS, GERD  Controlled,  Endo/Other  negative endocrine ROS  Renal/GU negative Renal ROS  negative genitourinary   Musculoskeletal  (+) Arthritis , Fibromyalgia -  Abdominal   Peds  Hematology negative hematology ROS (+)   Anesthesia Other Findings   Reproductive/Obstetrics                             Anesthesia Physical Anesthesia Plan  ASA: II  Anesthesia Plan: General   Post-op Pain Management:    Induction: Intravenous  PONV Risk Score and Plan: 4 or greater and Ondansetron, Dexamethasone, Propofol infusion, Treatment may vary due to age or medical condition and Scopolamine patch - Pre-op  Airway Management Planned: Oral ETT  Additional Equipment:   Intra-op Plan:   Post-operative Plan: Extubation in OR  Informed Consent: I have reviewed the patients History and Physical, chart, labs and discussed the procedure including the risks, benefits and alternatives for the proposed anesthesia with the patient or authorized representative who has indicated his/her understanding and acceptance.     Plan Discussed with:   Anesthesia Plan Comments:         Anesthesia Quick Evaluation

## 2018-06-05 NOTE — Op Note (Signed)
ELEASE SWARM  08/13/1951 13 Sept 2019    PCP:  Kathyrn Lass, MD   Surgeon: Kaylyn Lim, MD, FACS  Asst:  Romana Juniper, MD, FACS  Anes:  general  Preop Dx: Recurrent hiatal hernia Postop Dx: Same with dense adhesions of the upper stomach to the mediastinum  Procedure: Laparoscopic exploration and enterolysis; endoscopy   Location Surgery: [facility] OR 2 Complications: none  EBL:   20 cc  Drains:  none  Description of Procedure:  The patient was taken to OR 2 .  After anesthesia was administered and the patient was prepped  with Technicare and a timeout was performed.  Access to the abdomen was achieved with a 5 mm Optiview through the left upper quadrant a second 5 mm placed to the left umbilicus and subsequently increased to 11 larger scope using a second 5 left lateral the right and 4 right upper midline Nathanson retractor was used exposed this recurrent hiatal hernia.  This patient had a hiatal hernia done last year and 6 weeks later had a hip replacement.  I think her hernia recurred quickly probably related to either coughing vomiting or positioning changes during her surgery.  The wrap was herniated in a obvious large defect.  The posterior repair was intact and subsequent dissection.  The most striking problem was that the adhesions into the mediastinum or thick and very particularly in the most cephalad portion and we were unable to mobilize the stomach out of the sac safely.  The rapid hernia near the pericardium and I felt that laparoscopic dissection from below was likely to result and getting out of the proper plane and into the chest approximately pericardium.  Therefore I felt that this hernia would be best repaired the left chest.  Dr. Windle Guard performed upper endoscopy and we were able to pass the scope easily into the stomach and retroflexed it and see the wrap appeared to be intact but it herniated through this opening and we did not see any evidence of any perforations.  Did  not feel that a G-tube gastropexy will be of benefit to her at this point and will refer her for recurrent repair through the left chest.  Port sites were injected with Exparel and closed with 4-0 Monocryl and Dermabond.  The patient tolerated the procedure well and was taken to the PACU in stable condition.     Matt B. Hassell Done, [place], [facility], [place]

## 2018-06-05 NOTE — Progress Notes (Signed)
Pt. offered CPAP device as ordered.  Patient states that she would prefer not to use our machine.  Will bring hers from home if she is here more than one night.  Requested to have patient call if she changes her mind.

## 2018-06-05 NOTE — Op Note (Signed)
Preoperative diagnosis: recurrent hiatal hernia  Postoperative diagnosis: Same   Procedure: Upper endoscopy   Surgeon: Clovis Riley, M.D.  Anesthesia: Gen.   Description of procedure: The endoscopy was placed in the mouth and into the oropharynx and under endoscopic vision it was advanced to the esophagogastric junction.  The esophagus was noted to have robust peristalsis, no evidence of injury. The stomach had no evidence of gastrotomy or injury; the wrap appeared to be intact and the herniated stomach appeared to extend well above the wrap and cephalad to the heart. No bleeding or leaks were detected. The scope was withdrawn without difficulty.    Clovis Riley, M.D. General, Bariatric, & Minimally Invasive Surgery [facility] [place]

## 2018-06-06 ENCOUNTER — Encounter (HOSPITAL_COMMUNITY): Payer: Self-pay | Admitting: Surgery

## 2018-06-06 MED ORDER — HYDROMORPHONE HCL 2 MG PO TABS
1.0000 mg | ORAL_TABLET | Freq: Once | ORAL | Status: AC
  Administered 2018-06-06: 1 mg via ORAL
  Filled 2018-06-06: qty 1

## 2018-06-06 MED ORDER — HYDROMORPHONE HCL 2 MG PO TABS: 1.0000 mg | ORAL_TABLET | Freq: Four times a day (QID) | ORAL | 0 refills | Status: DC | PRN

## 2018-06-06 MED ORDER — ACETAMINOPHEN 500 MG PO TABS
1000.0000 mg | ORAL_TABLET | Freq: Once | ORAL | Status: AC
  Administered 2018-06-06: 1000 mg via ORAL
  Filled 2018-06-06: qty 2

## 2018-06-06 NOTE — Discharge Instructions (Signed)
[facility] [place] [facility] - DISCHARGE INSTRUCTIONS TO PATIENT Activity:  Driving - May drive in 3 to 5 days, when off pain meds   Lifting - No lifting more than 15 pounds for 7 days, then no limit  Wound Care:   May shower starting tomorrow  Diet:  Full liquids for 10 days, then may advance  Follow up appointment:  Call Dr. Earlie Server office [facility]) at [phone] for an appointment in 1 to 3 weeks.  Medications and dosages:  Resume your home medications.  You have a prescription for:  dilaudid  Call Dr. Hassell Done or his office  [phone]) if you have:  Temperature greater than 100.4,  Persistent nausea and vomiting,  Severe uncontrolled pain,  Redness, tenderness, or signs of infection (pain, swelling, redness, odor or green/yellow discharge around the site),  Difficulty breathing, headache or visual disturbances,  Any other questions or concerns you may have after discharge.  In an emergency, call 911 or go to an Emergency Department at a nearby hospital.

## 2018-06-06 NOTE — Plan of Care (Signed)
Pt is stable , comfortably sleeping at this time. Pain management in progress, effective.+ voiding.

## 2018-06-06 NOTE — Plan of Care (Signed)
Pt a&ox4; lying in bed with complaints of minimal pain in abdomen. Wants to talk with the doctor about getting Tylenol; does not want more narcotics. Hopeful for discharge today. Will continue to monitor.

## 2018-06-06 NOTE — Discharge Summary (Signed)
Physician Discharge Summary  Patient ID:  Rebecca Marshall  MRN: [id number]  DOB/AGE: [birth year] 66 y.o.  Admit date: 06/05/2018 Discharge date: 06/06/2018  Discharge Diagnoses:  1.  Recurrent hiatal hernia 2.  Obesity   Active Problems:   Hiatal hernia with GERD  Operation: Procedure(s): LAPAROSCOPIC  ENTERO-LYSIS, UPPER GI ENDOSCOPY on 06/05/2018 Hassell Done  Discharged Condition: good  Hospital Course: Rebecca Marshall is an 67 y.o. female whose primary care physician is Kathyrn Lass, MD and who was admitted 06/05/2018 with a chief complaint of No chief complaint on file. Marland Kitchen   She was brought to the operating room on 06/05/2018 and underwent LAPAROSCOPIC  ENTERO-LYSIS, UPPER GI ENDOSCOPY. She has tolerated liquids well and is ready to go home.   The discharge instructions were reviewed with the patient.  Consults: None  Significant Diagnostic Studies: Results for orders placed or performed during the hospital encounter of 06/05/18  CBC  Result Value Ref Range   WBC 11.3 (H) 4.0 - 10.5 K/uL   RBC 3.92 3.87 - 5.11 MIL/uL   Hemoglobin 12.0 12.0 - 15.0 g/dL   HCT 36.5 36.0 - 46.0 %   MCV 93.1 78.0 - 100.0 fL   MCH 30.6 26.0 - 34.0 pg   MCHC 32.9 30.0 - 36.0 g/dL   RDW 16.7 (H) 11.5 - 15.5 %   Platelets 204 150 - 400 K/uL  Creatinine, serum  Result Value Ref Range   Creatinine, Ser 0.71 0.44 - 1.00 mg/dL   GFR calc non Af Amer >60 >60 mL/min   GFR calc Af Amer >60 >60 mL/min    No results found.  Discharge Exam:  Vitals:   06/06/18 0132 06/06/18 0409  BP: (!) 98/52 110/73  Pulse: 83 82  Resp: 18 18  Temp: 99 F (37.2 C) 98.7 F (37.1 C)  SpO2: 97% 95%    General: obese WF who is alert and generally healthy appearing.  Lungs: Clear to auscultation and symmetric breath sounds. Heart:  RRR. No murmur or rub. Abdomen: Soft. No hernia. Normal bowel sounds. Incisions look good.  Discharge Medications:   Allergies as of 06/06/2018      Reactions   Nsaids Other (See  Comments)   Rectal bleeding   Codeine Nausea And Vomiting   Fentanyl Other (See Comments)   "knocked out" previously with a high dose.  Can tolerate low doses.       Medication List    TAKE these medications   acetaminophen 500 MG tablet Commonly known as:  TYLENOL Take 1,000 mg by mouth every 8 (eight) hours as needed for mild pain or moderate pain.   amoxicillin 500 MG capsule Commonly known as:  AMOXIL Take 500 mg by mouth 3 (three) times daily.   cetirizine 10 MG tablet Commonly known as:  ZYRTEC Take 10 mg by mouth daily as needed for allergies.   cyclobenzaprine 10 MG tablet Commonly known as:  FLEXERIL Take 1 tablet (10 mg total) by mouth 3 (three) times daily as needed for muscle spasms. What changed:  when to take this   eletriptan 40 MG tablet Commonly known as:  RELPAX Take by mouth one at the beginning of headache and may repeat once in 2  Hours for maximum of 2 tabs in 24hours What changed:    how much to take  how to take this  when to take this  reasons to take this  additional instructions   flecainide 150 MG tablet Commonly known as:  TAMBOCOR Take 1 tablet (150 mg total) by mouth 2 (two) times daily.   HYDROmorphone 2 MG tablet Commonly known as:  DILAUDID Take 0.5 tablets (1 mg total) by mouth every 6 (six) hours as needed for severe pain.   levothyroxine 137 MCG tablet Commonly known as:  SYNTHROID, LEVOTHROID Take 137 mcg by mouth daily before breakfast.   Melatonin 10 MG Tabs Take 10 mg by mouth at bedtime as needed (for sleep.).   ondansetron 4 MG disintegrating tablet Commonly known as:  ZOFRAN-ODT 4mg  ODT q4 hours prn nausea/vomit What changed:    how much to take  how to take this  when to take this  reasons to take this  additional instructions   prochlorperazine 10 MG tablet Commonly known as:  COMPAZINE Take 1 tablet (10 mg total) by mouth every 6 (six) hours as needed for nausea or vomiting (Use for nausea and /  or vomiting unresolved with ondansetron (Zofran).).   sertraline 100 MG tablet Commonly known as:  ZOLOFT Take 200 mg by mouth daily.   topiramate 25 MG tablet Commonly known as:  TOPAMAX TAKE 2 TABLETS AT BEDTIME What changed:  when to take this   traZODone 100 MG tablet Commonly known as:  DESYREL Take 2 tablets (200 mg total) by mouth at bedtime. What changed:  how much to take   triamcinolone cream 0.1 % Commonly known as:  KENALOG Apply 1 application topically 2 (two) times daily as needed (for ezcema).       Disposition: Discharge disposition: 01-Home or Self Care       Discharge Instructions    Diet - low sodium heart healthy   Complete by:  As directed    Increase activity slowly   Complete by:  As directed          Signed: Alphonsa Overall, M.D., [facility] Surgery Office:  [phone]  06/06/2018, 10:18 AM

## 2018-06-06 NOTE — Progress Notes (Signed)
[facility] Office:  [phone] General Surgery Progress Note   LOS: 1 day  POD -  1 Day Post-Op  Chief Complaint: Recurrent hiatal hernia  Assessment and Plan: 1.  LAPAROSCOPIC  ENTERO-LYSIS,  UPPER GI ENDOSCOPY - 06/05/2018 - Hassell Done  Taking liquids.  Ready to go home.  2.  Nissen - 06/2017 Hassell Done 3.  DVT prophylaxis - SQ Heparin   Active Problems:   Hiatal hernia with GERD  Subjective:  Sore, but taking po's well.  Ready to to home.  Nephew at bedside.  Objective:   Vitals:   06/06/18 0132 06/06/18 0409  BP: (!) 98/52 110/73  Pulse: 83 82  Resp: 18 18  Temp: 99 F (37.2 C) 98.7 F (37.1 C)  SpO2: 97% 95%     Intake/Output from previous day:  09/13 0701 - 09/14 0700 In: 4429.2 [P.O.:220; I.V.:4109.2; IV Piggyback:100] Out: 1260 [Urine:1250; Blood:10]  Intake/Output this shift:  No intake/output data recorded.   Physical Exam:   General: Obese WF who is alert and oriented.    HEENT: Normal. Pupils equal. .   Lungs: Clear.   Abdomen: Soft. Has BS.   Wound: Clean   Lab Results:    Recent Labs    06/05/18 1324  WBC 11.3*  HGB 12.0  HCT 36.5  PLT 204    BMET   Recent Labs    06/05/18 1324  CREATININE 0.71    PT/INR  No results for input(s): LABPROT, INR in the last 72 hours.  ABG  No results for input(s): PHART, HCO3 in the last 72 hours.  Invalid input(s): PCO2, PO2   Studies/Results:  No results found.   Anti-infectives:   Anti-infectives (From admission, onward)   Start     Dose/Rate Route Frequency Ordered Stop   06/05/18 0600  ceFAZolin (ANCEF) IVPB 2g/100 mL premix  Status:  Discontinued     2 g 200 mL/hr over 30 Minutes Intravenous On call to O.R. 06/05/18 0535 06/05/18 1238      Alphonsa Overall, MD, FACS Pager: [facility] Office: [phone] 06/06/2018

## 2018-06-10 ENCOUNTER — Telehealth: Payer: Self-pay | Admitting: Neurology

## 2018-06-10 NOTE — Telephone Encounter (Signed)
Called the patient to review the sleep study results. Dr Dohmeier was surprised to find out that the patient no longer has sleep apnea. Her OSA has been resolved. Dr Dohmeier would recommend the pt continue to follow up with Dr Jaynee Eagles for treatment of her occipital headaches with a different approach. Dr Brett Fairy will dc CPAP based off of this sleep study. Advised the patient of her low oxygen level through out the test. Patient states that she recently was going through with a procedure for hernia repair with Dr Hassell Done and states they were unable to complete the procedure. She states this is the reasoning behind why her oxygen levels are dropping. I did advise the patient to make her PCP aware of the finding in case this doesn't resolve and someone needs to order oxygen at night for her. Unfortunetly the patient is unable to get oxygen through [place] since sleep apnea is no longer the issue. Pt verbalized understanding. She would like to discuss with Dr Jaynee Eagles or her nurse about an apt of call to discuss treatment for the headaches since she discontinuing CPAP. I informed her I would pass that along to them. Pt verbalized understanding. Pt had no questions at this time but was encouraged to call back if questions arise.

## 2018-06-10 NOTE — Telephone Encounter (Signed)
Called and offered the patient the first available office visit that was open. Oct 30,2019 at 8:30 am. Pt verbalized understanding. Informed her that if there are any openings that come available sooner we can add her to waitlist to called. Patient was appreciative.

## 2018-06-10 NOTE — Telephone Encounter (Signed)
Rebecca Marshall, Let patient know she can call the front desk and make a follow up appointment with me thanks.

## 2018-07-14 ENCOUNTER — Other Ambulatory Visit: Payer: Self-pay | Admitting: Family Medicine

## 2018-07-14 DIAGNOSIS — Z1231 Encounter for screening mammogram for malignant neoplasm of breast: Secondary | ICD-10-CM

## 2018-07-21 ENCOUNTER — Ambulatory Visit (INDEPENDENT_AMBULATORY_CARE_PROVIDER_SITE_OTHER): Payer: BLUE CROSS/BLUE SHIELD | Admitting: Internal Medicine

## 2018-07-21 ENCOUNTER — Encounter: Payer: Self-pay | Admitting: Internal Medicine

## 2018-07-21 VITALS — BP 110/82 | HR 75 | Ht 68.0 in | Wt 223.6 lb

## 2018-07-21 DIAGNOSIS — I471 Supraventricular tachycardia: Secondary | ICD-10-CM | POA: Diagnosis not present

## 2018-07-21 DIAGNOSIS — R197 Diarrhea, unspecified: Secondary | ICD-10-CM | POA: Diagnosis not present

## 2018-07-21 DIAGNOSIS — R001 Bradycardia, unspecified: Secondary | ICD-10-CM

## 2018-07-21 DIAGNOSIS — K449 Diaphragmatic hernia without obstruction or gangrene: Secondary | ICD-10-CM | POA: Diagnosis not present

## 2018-07-21 DIAGNOSIS — R55 Syncope and collapse: Secondary | ICD-10-CM | POA: Diagnosis not present

## 2018-07-21 DIAGNOSIS — L570 Actinic keratosis: Secondary | ICD-10-CM | POA: Diagnosis not present

## 2018-07-21 DIAGNOSIS — R002 Palpitations: Secondary | ICD-10-CM

## 2018-07-21 DIAGNOSIS — D509 Iron deficiency anemia, unspecified: Secondary | ICD-10-CM | POA: Diagnosis not present

## 2018-07-21 MED ORDER — FLECAINIDE ACETATE 150 MG PO TABS: 150.0000 mg | ORAL_TABLET | Freq: Two times a day (BID) | ORAL | 3 refills | Status: DC

## 2018-07-21 MED FILL — PROMETHAZINE W/DM SYRUP: 6.25-15 | 6 days supply | Qty: 120 | Fill #0

## 2018-07-21 MED FILL — DOXYCYCLINE HYCLATE 100 MG: 100 | 10 days supply | Qty: 20 | Fill #0

## 2018-07-21 NOTE — Progress Notes (Signed)
Patient Care Team: Kathyrn Lass, MD as PCP - General (Family Medicine)   HPI  Rebecca Marshall is a 67 y.o. female Seen with a chief complaint of preoperative clearance.  She also has  atrial tachycardia    She continues to complain of palpitations;  They are infrequent and last about 10 min  No interval syncope  Has struggled since she underwent surgery for hiatal hernia.  Has had problems with significant postprandial pain.  She is scheduled to see Dr. Servando Snare later this week.        DATE PR interval QRSduration Dose  7/16 21 118 100  3/17 17 109 150  12/18 22 110 150  10/19 208 108 150         Exercise tolerance is pretty good.      DATE TEST EF   3/17 Echo   55-65 % LAE mod (35/1.55/40)  3/17 MYOVIEW   72 % No ischemia                Past Medical History:  Diagnosis Date  . Acne nodule 11/26/2011  . ADENOCARCINOMA, THYROID GLAND, PAPILLARY 05/17/2009   04/2009 Two surgeries for thyroid cancer Path report from last surgery :THYROID, LEFT, HEMITHYROIDECTOMY: - MULTIPLE FOCI OF FOLLICULAR VARIANT OR PAPILLARY THYROID CARCINOMA, 1.7 CM IN GREATEST DIMENSION, CONFINED WITHIN THE THYROID TISSUE. - ANGIOLYMPHATIC INVASION PRESENT. - ISTHMUS RESECTION MARGIN INVOLVED BY TUMOR. - FOLLICULAR ADENOMA(S) WITH ASSOCIATED LYMPHOCYTIC THYROIDITIS.     . AK (actinic keratosis) 04/16/2012  . ALLERGIC RHINITIS 01/30/2009   Qualifier: Diagnosis of  By: Andria Frames MD, Gwyndolyn Saxon    . Anxiety   . ANXIETY DEPRESSION 09/03/2010   Long standing depression with anxiety. Good response to medications initially. Has been to [facility]    Marland Kitchen Arthritis   . 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 08/11/2007   LEFT GNF:AOZHY hip replacement (Dr Berenice Primas) with recalled hardware. Had revision by Dr Berenice Primas in 2011 with  ASR .Marland Kitchen    Marland Kitchen Atrial dysrhythmia    FOLLOWED BY DR Caryl Comes WAS HAVING PALPITATIONS AND SYNCOPAL EPISODES ONSET 9 YEARS AGO ; STARTED ON FLECAIINIDE , PER PATIENT TOLERATES WELL, LAST SYNCOPAL  EPISODE WAS OVER 5 YEARS AGO ; SEE LOV IN  EPIC   . Atrial tachycardia ([place]) 06/05/2011   \Treated with flecainide-2011 2010-QRS duration 88 ms 2011 QRS duration 88 ms 2014 QRS duration 98 ms 2014 QRS 120mg    120 ms    . Bilateral bunions 12/18/2010  . Bilateral knee pain 06/29/2015  . Cancer [facility])    thyroid-status post resection on replacement Dr. Chalmers Cater  . Clinical depression 03/06/2016  . Cognitive changes 10/12/2014  . Complication of anesthesia    FENTANYL CAUSED ANAPHYLAXIS DURING CA COLONOSCOPY  . Depression   . Dysrhythmia    Dr Caryl Comes monitoring patient, atrial tachycardia under control, cardiac clearance 09/10/17  . Family history of diabetes mellitus (DM) 05/11/2014  . Fibromyalgia   . Gait abnormality 12/18/2010  . GE reflux   . GERD 07/14/2007   Qualifier: Diagnosis of  By: Nori Riis MD, Clarise Cruz    . Headache    migraines  . Headache upon awakening 06/05/2011  . Herniated lumbar disc without myelopathy 12/10/2013   Had surgery by neurosurgery in December, 2014.L4-5 laminotomy and microdiscectomy Dr Margreta Journey Done at [facility]   . Hip pain, chronic 04/05/2015   Status post THR plus revision.   . Hypoxemia 01/30/2015  . Memory change 05/12/2014  Reports problems with short-term memory, focusing, concentration in the last 6-12 months. Problems were significant enough that she had to quit her part-time job is fairly distressed about this.   . Metatarsalgia 12/18/2010  . Nausea & vomiting 06/05/2011  . Nocturia more than twice per night 01/30/2015  . Nonallopathic lesion of cervical region 11/26/2011  . Nonallopathic lesion of lumbosacral region 11/26/2011  . OSA on CPAP 01/30/2015  . Osteoporosis   . PONV (postoperative nausea and vomiting)    did not have nausea/vomiting with nissen fundoplication done 73/4193 at [facility]  . Poor compliance with CPAP treatment 03/24/2017  . Rash and nonspecific skin eruption 01/09/2016    located in the 10 o'clock position on Right breast    .  Severe obesity (BMI >= 40) ([place]) 01/30/2015  . Sleep apnea 10/2015   x 1 year, CPAP  . Snorings 01/30/2015  . Status post Nissen fundoplication 79/0/2409  . Subacute cough 03/27/2012  . Thyroid disease   . Well adult health check 06/05/2011    Past Surgical History:  Procedure Laterality Date  . ABDOMINAL HYSTERECTOMY    . BREAST EXCISIONAL BIOPSY Right 25+ yrs ago   benign  . caroitid dopplers Bilateral 11/2015   normal  . CATARACT EXTRACTION Bilateral 09/05/15, 09/12/15  . CHOLECYSTECTOMY    . COLONOSCOPY  04/2011  . ESOPHAGEAL MANOMETRY N/A 03/31/2017   Procedure: ESOPHAGEAL MANOMETRY (EM);  Surgeon: Ronald Lobo, MD;  Location: [facility] ENDOSCOPY;  Service: Endoscopy;  Laterality: N/A;  . HIATAL HERNIA REPAIR N/A 06/05/2018   Procedure: LAPAROSCOPIC  ENTERO-LYSIS  ERAS PATHWAY;  Surgeon: Johnathan Hausen, MD;  Location: [facility] ORS;  Service: General;  Laterality: N/A;  . LAPAROSCOPIC NISSEN FUNDOPLICATION N/A 73/01/3298   Procedure: LAPAROSCOPIC NISSEN FUNDOPLICATION;  Surgeon: Johnathan Hausen, MD;  Location: [facility] ORS;  Service: General;  Laterality: N/A;  . THYROIDECTOMY  04/2009   papillary carcimao Dr. Erik Obey  . TOE SURGERY     bone spur removed - rt great toe  . TOTAL HIP ARTHROPLASTY  2009 and 2011   L hip   . TOTAL HIP ARTHROPLASTY Right 09/12/2017   Procedure: TOTAL HIP ARTHROPLASTY ANTERIOR APPROACH;  Surgeon: Dorna Leitz, MD;  Location: [facility];  Service: Orthopedics;  Laterality: Right;  . TUBAL LIGATION    . UPPER GI ENDOSCOPY  06/05/2018   Procedure: UPPER GI ENDOSCOPY;  Surgeon: Johnathan Hausen, MD;  Location: [facility];  Service: General;;    Current Outpatient Medications  Medication Sig Dispense Refill  . acetaminophen (TYLENOL) 500 MG tablet Take 1,000 mg by mouth every 8 (eight) hours as needed for mild pain or moderate pain.     . cetirizine (ZYRTEC) 10 MG tablet Take 10 mg by mouth daily as needed for allergies.     . cyclobenzaprine (FLEXERIL) 10 MG tablet Take 10 mg by mouth 2  (two) times daily.    Marland Kitchen eletriptan (RELPAX) 40 MG tablet Take by mouth one at the beginning of headache and may repeat once in 2  Hours for maximum of 2 tabs in 24hours 27 tablet 3  . flecainide (TAMBOCOR) 150 MG tablet Take 1 tablet (150 mg total) by mouth 2 (two) times daily. 180 tablet 0  . levothyroxine (SYNTHROID, LEVOTHROID) 137 MCG tablet Take 137 mcg by mouth daily before breakfast.    . Melatonin 10 MG TABS Take 10 mg by mouth at bedtime as needed (for sleep.).    Marland Kitchen ondansetron (ZOFRAN ODT) 4 MG disintegrating tablet 4mg  ODT q4 hours  prn nausea/vomit 30 tablet 1  . prochlorperazine (COMPAZINE) 10 MG tablet Take 1 tablet (10 mg total) by mouth every 6 (six) hours as needed for nausea or vomiting (Use for nausea and / or vomiting unresolved with ondansetron (Zofran).). 20 tablet 1  . sertraline (ZOLOFT) 100 MG tablet Take 200 mg by mouth daily.    Marland Kitchen topiramate (TOPAMAX) 25 MG tablet TAKE 2 TABLETS AT BEDTIME 180 tablet 0  . traZODone (DESYREL) 100 MG tablet Take 300 mg by mouth at bedtime.    . triamcinolone cream (KENALOG) 0.1 % Apply 1 application topically 2 (two) times daily as needed (for ezcema).     No current facility-administered medications for this visit.     Allergies  Allergen Reactions  . Nsaids Other (See Comments)    Rectal bleeding  . Codeine Nausea And Vomiting  . Fentanyl Other (See Comments)    "knocked out" previously with a high dose.  Can tolerate low doses.     Review of Systems negative except from HPI and PMH  Physical Exam BP 110/82   Pulse 75   Ht 5\' 8"  (1.727 m)   Wt 223 lb 9.6 oz (101.4 kg)   SpO2 96%   BMI 34.00 kg/m  Well developed and nourished in no acute distress HENT normal Neck supple with JVP-flat Clear Regular rate and rhythm, no murmurs or gallops Abd-soft with active BS No Clubbing cyanosis edema Skin-warm and dry A & Oriented  Grossly normal sensory and motor function  ECG demonstrates sinus @ 75 21/11/42 PRWP Unchanged  from 2017, 15 Personally reviewed     Assessment and  Plan  Obstructive sleep apnea-treated  Fatigue  Bradycardia-reported  Atrial tachycardia/palpitations-recurrent  Syncope prob neurally mediated  Abdominal pain

## 2018-07-21 NOTE — Patient Instructions (Signed)
Your physician recommends that you continue on your current medications as directed. Please refer to the Current Medication list given to you today.   Your physician wants you to follow-up in: YEAR WITH DR KLEIN  You will receive a reminder letter in the mail two months in advance. If you don't receive a letter, please call our office to schedule the follow-up appointment.  

## 2018-07-22 ENCOUNTER — Ambulatory Visit: Payer: Self-pay | Admitting: Neurology

## 2018-07-23 DIAGNOSIS — H353111 Nonexudative age-related macular degeneration, right eye, early dry stage: Secondary | ICD-10-CM | POA: Diagnosis not present

## 2018-07-28 ENCOUNTER — Encounter: Payer: BLUE CROSS/BLUE SHIELD | Admitting: Cardiothoracic Surgery

## 2018-07-31 ENCOUNTER — Institutional Professional Consult (permissible substitution) (INDEPENDENT_AMBULATORY_CARE_PROVIDER_SITE_OTHER): Payer: BLUE CROSS/BLUE SHIELD | Admitting: Cardiothoracic Surgery

## 2018-07-31 VITALS — BP 101/69 | HR 80 | Resp 20 | Ht 68.0 in | Wt 222.0 lb

## 2018-07-31 DIAGNOSIS — Z9889 Other specified postprocedural states: Secondary | ICD-10-CM | POA: Diagnosis not present

## 2018-07-31 DIAGNOSIS — K9189 Other postprocedural complications and disorders of digestive system: Secondary | ICD-10-CM | POA: Diagnosis not present

## 2018-07-31 DIAGNOSIS — Z8719 Personal history of other diseases of the digestive system: Secondary | ICD-10-CM | POA: Diagnosis not present

## 2018-07-31 NOTE — Progress Notes (Signed)
[address]       [place],Re[place] [id number]             [phone]                    Rebecca Marshall Catherine Medical Record #[id number] Date of Birth: 01/19/1951  Referring: Rebecca Hausen, MD Primary Care: Rebecca Lass, MD Primary Cardiologist: Rebecca Marshall.  Chief Complaint:    Chief Complaint  Patient presents with  . Diaphragmatic Hernia    Surgical eval    History of Present Illness:    Rebecca Marshall 67 y.o. female is seen in the office  today for "slipped Nissen".  Patient is a 67 year old female who is been bothered by significant reflux symptoms and epigastric discomfort in the past.  In October 2018 she underwent laparoscopic Nissen fundal fundoplication.  Following this she had a right hip replacement.  Several months postop had recurrent epigastric discomfort symptoms and reflux symptoms.  Repeat radiographic swallow was done in June 2019 demonstrating the Nissen wrap in the chest.  In September 2019 she had an attempt at repair of the slipped Nissen 5 laparoscopic/abdominal approach.  This was aborted due to adhesions.       Current Activity/ Functional Status:  Patient is independent with mobility/ambulation, transfers, ADL's, IADL's.   Zubrod Score: At the time of surgery this patient's most appropriate activity status/level should be described as: []     0    Normal activity, Rebecca symptoms [x]     1    Restricted in physical strenuous activity but ambulatory, able to do out light work []     2    Ambulatory and capable of self care, unable to do work activities, up and about               >50 % of waking hours                              []     3    Only limited self care, in bed greater than 50% of waking hours []     4    Completely disabled, Rebecca self care, confined to bed or chair []     5    Moribund   Past Medical History:  Diagnosis Date  . Acne nodule 11/26/2011  . ADENOCARCINOMA, THYROID GLAND, PAPILLARY 05/17/2009   04/2009 Two surgeries for thyroid cancer Path report from last surgery :THYROID, LEFT, HEMITHYROIDECTOMY: - MULTIPLE FOCI OF FOLLICULAR VARIANT OR PAPILLARY THYROID CARCINOMA, 1.7 CM IN GREATEST DIMENSION, CONFINED WITHIN THE THYROID TISSUE. - ANGIOLYMPHATIC INVASION PRESENT. - ISTHMUS RESECTION MARGIN INVOLVED BY TUMOR. - FOLLICULAR ADENOMA(S) WITH ASSOCIATED LYMPHOCYTIC THYROIDITIS.     . AK (actinic keratosis) 04/16/2012  . ALLERGIC RHINITIS 01/30/2009   Qualifier: Diagnosis of  By: Rebecca Frames MD, Rebecca Marshall    . Anxiety   . ANXIETY DEPRESSION 09/03/2010   Long standing depression with anxiety. Good response to medications initially. Has been to [facility].    Rebecca Kitchen Arthritis   . 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 08/11/2007   LEFT ZDG:LOVFI hip replacement (Rebecca Marshall) with recalled hardware. Had revision by Rebecca Marshall in 2011 with  ASR .Rebecca Kitchen    Rebecca Kitchen Atrial dysrhythmia    FOLLOWED BY Rebecca Marshall WAS HAVING PALPITATIONS AND SYNCOPAL EPISODES ONSET 9 YEARS AGO ; STARTED ON FLECAIINIDE , PER PATIENT TOLERATES  WELL, LAST SYNCOPAL EPISODE WAS OVER 5 YEARS AGO ; SEE LOV IN  [place]   . Atrial tachycardia ([place]) 06/05/2011   \Treated with flecainide-2011 2010-QRS duration 88 ms 2011 QRS duration 88 ms 2014 QRS duration 98 ms 2014 QRS 120mg    120 ms    . Bilateral bunions 12/18/2010  . Bilateral knee pain 06/29/2015  . Cancer [facility])    thyroid-status post resection on replacement Rebecca. Chalmers [place]  . Clinical depression 03/06/2016  . Cognitive changes 10/12/2014  . Complication of anesthesia    FENTANYL CAUSED ANAPHYLAXIS DURING CA COLONOSCOPY  . Depression   . Dysrhythmia    Rebecca Marshall monitoring patient, atrial tachycardia under control, cardiac clearance 09/10/17  . Family history of diabetes mellitus (DM) 05/11/2014  . Fibromyalgia   . Gait abnormality 12/18/2010  . GE reflux   . GERD 07/14/2007   Qualifier: Diagnosis of  By: Rebecca Riis MD, Rebecca Marshall    . Headache    migraines  . Headache upon awakening 06/05/2011  . Herniated lumbar disc  without myelopathy 12/10/2013   Had surgery by neurosurgery in December, 2014.L4-5 laminotomy and microdiscectomy Rebecca Marshall   . Hip pain, chronic 04/05/2015   Status post THR plus revision.   . Hypoxemia 01/30/2015  . Memory change 05/12/2014   Reports problems with short-term memory, focusing, concentration in the last 6-12 months. Problems were significant enough that she had to quit her part-time job is fairly distressed about this.   . Metatarsalgia 12/18/2010  . Nausea & vomiting 06/05/2011  . Nocturia more than twice per night 01/30/2015  . Nonallopathic lesion of cervical region 11/26/2011  . Nonallopathic lesion of lumbosacral region 11/26/2011  . OSA on CPAP 01/30/2015  . Osteoporosis   . PONV (postoperative nausea and vomiting)    did not have nausea/vomiting with nissen fundoplication done 48/1856 at [facility]  . Poor compliance with CPAP treatment 03/24/2017  . Rash and nonspecific skin eruption 01/09/2016    located in the 10 o'clock position on Right breast    . Severe obesity (BMI >= 40) ([place]) 01/30/2015  . Sleep apnea 10/2015   x 1 year, CPAP  . Snorings 01/30/2015  . Status post Nissen fundoplication 31/12/9700  . Subacute cough 03/27/2012  . Thyroid disease   . Well adult health check 06/05/2011    Past Surgical History:  Procedure Laterality Date  . ABDOMINAL HYSTERECTOMY    . BREAST EXCISIONAL BIOPSY Right 25+ yrs ago   benign  . caroitid dopplers Bilateral 11/2015   normal  . CATARACT EXTRACTION Bilateral 09/05/15, 09/12/15  . CHOLECYSTECTOMY    . COLONOSCOPY  04/2011  . ESOPHAGEAL MANOMETRY N/A 03/31/2017   Procedure: ESOPHAGEAL MANOMETRY (EM);  Surgeon: Rebecca Lobo, MD;  Location: [facility];  Service: Endoscopy;  Laterality: N/A;  . HIATAL HERNIA REPAIR N/A 06/05/2018   Procedure: LAPAROSCOPIC  ENTERO-LYSIS  ERAS PATHWAY;  Surgeon: Rebecca Hausen, MD;  Location: [facility];  Service: General;  Laterality: N/A;  . LAPAROSCOPIC NISSEN  FUNDOPLICATION N/A 63/03/8587   Procedure: LAPAROSCOPIC NISSEN FUNDOPLICATION;  Surgeon: Rebecca Hausen, MD;  Location: [facility];  Service: General;  Laterality: N/A;  . THYROIDECTOMY  04/2009   papillary carcimao Rebecca. Erik Obey  . TOE SURGERY     bone spur removed - rt great toe  . TOTAL HIP ARTHROPLASTY  2009 and 2011   L hip   . TOTAL HIP ARTHROPLASTY Right 09/12/2017   Procedure: TOTAL HIP ARTHROPLASTY ANTERIOR APPROACH;  Surgeon:  Dorna Leitz, MD;  Location: [facility];  Service: Orthopedics;  Laterality: Right;  . TUBAL LIGATION    . UPPER GI ENDOSCOPY  06/05/2018   Procedure: UPPER GI ENDOSCOPY;  Surgeon: Rebecca Hausen, MD;  Location: [facility];  Service: General;;    Family History  Problem Relation Age of Onset  . Cancer Other   . Alcohol abuse Mother   . Cancer Mother 57       Lung   . Alcohol abuse Father   . Cancer Sister 72       Breast     Social History   Tobacco Use  Smoking Status Never Smoker  Smokeless Tobacco Never Used    Social History   Substance and Sexual Activity  Alcohol Use Rebecca  . Alcohol/week: 0.0 standard drinks   Comment: none     Allergies  Allergen Reactions  . Nsaids Other (See Comments)    Rectal bleeding  . Codeine Nausea And Vomiting  . Fentanyl Other (See Comments)    "knocked out" previously with a high dose.  Can tolerate low doses.     Current Outpatient Medications  Medication Sig Dispense Refill  . acetaminophen (TYLENOL) 500 MG tablet Take 1,000 mg by mouth every 8 (eight) hours as needed for mild pain or moderate pain.     . cetirizine (ZYRTEC) 10 MG tablet Take 10 mg by mouth daily as needed for allergies.     . cyclobenzaprine (FLEXERIL) 10 MG tablet Take 10 mg by mouth 2 (two) times daily.    Rebecca Kitchen eletriptan (RELPAX) 40 MG tablet Take by mouth one at the beginning of headache and may repeat once in 2  Hours for maximum of 2 tabs in 24hours 27 tablet 3  . flecainide (TAMBOCOR) 150 MG tablet Take 1 tablet (150 mg total) by mouth 2  (two) times daily. 180 tablet 3  . levothyroxine (SYNTHROID, LEVOTHROID) 137 MCG tablet Take 137 mcg by mouth daily before breakfast.    . Melatonin 10 MG TABS Take 10 mg by mouth at bedtime as needed (for sleep.).    Rebecca Kitchen ondansetron (ZOFRAN ODT) 4 MG disintegrating tablet 4mg  ODT q4 hours prn nausea/vomit 30 tablet 1  . prochlorperazine (COMPAZINE) 10 MG tablet Take 1 tablet (10 mg total) by mouth every 6 (six) hours as needed for nausea or vomiting (Use for nausea and / or vomiting unresolved with ondansetron (Zofran).). 20 tablet 1  . sertraline (ZOLOFT) 100 MG tablet Take 200 mg by mouth daily.    Rebecca Kitchen topiramate (TOPAMAX) 25 MG tablet TAKE 2 TABLETS AT BEDTIME 180 tablet 0  . traZODone (DESYREL) 100 MG tablet Take 300 mg by mouth at bedtime.    . triamcinolone cream (KENALOG) 0.1 % Apply 1 application topically 2 (two) times daily as needed (for ezcema).     Rebecca current facility-administered medications for this visit.     Pertinent items are noted in HPI.   Review of Systems:     Cardiac Review of Systems: [Y] = yes  or   [ N ] = Rebecca   Chest Pain [  n  ]  Resting SOB [  n ] Exertional SOB  [n  ]  Orthopnea Florencio.Farrier  ]   Pedal Edema [ n  ]    Palpitations [ n ] Syncope  [ n ]   Presyncope [n   ]   General Review of Systems: [Y] = yes [  ]=Rebecca Constitional: recent weight change Blue.Reese  ];  Wt loss over the last 3 months [ gain  ] anorexia [  ]; fatigue [  ]; nausea [  ]; night sweats [  ]; fever [  ]; or chills [  ];           Eye : blurred vision [  ]; diplopia [   ]; vision changes [  ];  Amaurosis fugax[  ]; Resp: cough [  ];  wheezing[  ];  hemoptysis[  ]; shortness of breath[  ]; paroxysmal nocturnal dyspnea[  ]; dyspnea on exertion[  ]; or orthopnea[  ];  GI:  gallstones[  ], vomiting[  ];  dysphagia[ y ]; melena[  ];  hematochezia [  ]; heartburn[  ];   Hx of  Colonoscopy[ y ]; GU: kidney stones [  ]; hematuria[  ];   dysuria [  ];  nocturia[  ];  history of     obstruction [  ]; urinary frequency  [  ]             Skin: rash, swelling[  ];, hair loss[  ];  peripheral edema[  ];  or itching[  ]; Musculosketetal: myalgias[  ];  joint swelling[  ];  joint erythema[  ];  joint pain[ y ];  back pain[  ];  Heme/Lymph: bruising[  ];  bleeding[  ];  anemia[  ];  Neuro: TIA[  ];  headaches[  ];  stroke[  ];  vertigo[  ];  seizures[  ];   paresthesias[  ];  difficulty walking[  ];  Psych:depression[  ]; anxiety[  ];  Endocrine: diabetes[  ];  thyroid dysfunction[  ];  Immunizations: Flu up to date [ y ]; Pneumococcal up to date [ y ];  Other:     PHYSICAL EXAMINATION: BP 101/69   Pulse 80   Resp 20   Ht 5\' 8"  (1.727 m)   Wt 222 lb (100.7 kg)   SpO2 95% Comment: RA  BMI 33.75 kg/m  General appearance: alert, cooperative and Rebecca distress Head: Normocephalic, without obvious abnormality, atraumatic Neck: Rebecca adenopathy, Rebecca carotid bruit, Rebecca JVD, supple, symmetrical, trachea midline and thyroid not enlarged, symmetric, Rebecca tenderness/mass/nodules Lymph nodes: Cervical, supraclavicular, and axillary nodes normal. Resp: clear to auscultation bilaterally Back: symmetric, Rebecca curvature. ROM normal. Rebecca CVA tenderness. Cardio: regular rate and rhythm, S1, S2 normal, Rebecca murmur, click, rub or gallop GI: soft, non-tender; bowel sounds normal; Rebecca masses,  Rebecca organomegaly Extremities: extremities normal, atraumatic, Rebecca cyanosis or edema Neurologic: Grossly normal  Diagnostic Studies & Laboratory data:     Recent Radiology Findings:    CLINICAL DATA:  History of fundoplication for hiatal hernia repair with esophageal reflux disease  EXAM: UPPER GI SERIES WITH KUB  TECHNIQUE: After obtaining a scout radiograph a routine upper GI series was performed using thin and high density barium.    FLUOROSCOPY TIME:  Fluoroscopy Time:  1 minutes 54 seconds  Radiation Exposure Index (if provided by the fluoroscopic device): 439 mGy  Number of Acquired Spot Images: 0  COMPARISON:   Water-soluble upper GI 69/48/5462 after fundoplication  FINDINGS: A preliminary film of the abdomen shows a nonspecific bowel gas pattern. There are degenerative changes in the lower lumbar spine.  Initially double-contrast study was performed. The mucosa the esophagus is well seen with Rebecca abnormality noted. The swallowing mechanism appears normal. However, there is a small to moderate size paraesophageal hiatal hernia present. The more distal stomach is unremarkable. The duodenal bulb fills  and the duodenal loop is in normal position.  At the end of the study Rebecca gastroesophageal reflux could be demonstrated with the water siphon maneuver. A barium pill was given which passed quickly into the hernia and then into the more distal stomach without delay.  IMPRESSION: 1. Small to moderate size paraesophageal hiatal hernia. Rebecca definite gastroesophageal reflux. Barium pill passes into the stomach without delay. 2. The more distal stomach and the duodenum are unremarkable.   Electronically Signed   By: Ivar Drape M.D.   On: 03/17/2018 08:34  CLINICAL DATA:  One day postop from Nissen fundoplication and hiatal hernia repair.  EXAM: WATER SOLUBLE UPPER GI SERIES WITH KUB  TECHNIQUE: Single-column upper GI series was performed using water soluble contrast.  CONTRAST:  Omnipaque water-soluble contrast  COMPARISON:  03/17/2017  FLUOROSCOPY TIME:  Fluoroscopy Time:  0 minutes 48 seconds  Radiation Exposure Index (if provided by the fluoroscopic device): 16.2 mGy  Number of Acquired Spot Images: 0  FINDINGS: Scout radiograph shows a normal bowel gas pattern. Atelectasis in the left lung base. Right upper quadrant surgical clips noted.  Water-soluble upper GI series shows Rebecca evidence of esophageal mass or obstruction. Esophageal dysmotility is seen with persistent tertiary contractions. Nissen fundoplication wrap is seen. Rebecca evidence of persistent hiatal  hernia. Rebecca evidence of obstruction to passage of contrast. Rebecca evidence of contrast leak or extravasation. Remainder of the stomach is normal appearance. Prompt gastric emptying noted. Normal appearance of duodenal sweep.  IMPRESSION: Expected postoperative appearance status post Nissen fundoplication. Rebecca evidence of postop obstruction or leak.  Esophageal dysmotility.   Electronically Signed   By: Earle Gell M.D.   On: 06/27/2017 09:11   I have independently reviewed the above radiology studies  and reviewed the findings with the patient.   Recent Lab Findings: Lab Results  Component Value Date   WBC 11.3 (H) 06/05/2018   HGB 12.0 06/05/2018   HCT 36.5 06/05/2018   PLT 204 06/05/2018   GLUCOSE 120 (H) 06/01/2018   LDLDIRECT 122 (H) 02/28/2012   ALT 13 (L) 09/10/2017   AST 25 09/10/2017   NA 142 06/01/2018   K 3.6 06/01/2018   CL 107 06/01/2018   CREATININE 0.71 06/05/2018   BUN 16 06/01/2018   CO2 26 06/01/2018   TSH 1.445 05/11/2014   INR 1.01 09/10/2017   HGBA1C 5.4 05/11/2014   Surgeon:         Kaylyn Lim, MD, FACS  Asst:                Romana Juniper, MD, FACS  Anes:               general  Preop Dx:        Recurrent hiatal hernia Postop Dx:      Same with dense adhesions of the upper stomach to the mediastinum  Procedure:      Laparoscopic exploration and enterolysis; endoscopy   Location Surgery:        [facility] OR 2 Complications:            none  EBL:                            20 cc  Drains:  none  Description of Procedure:             The patient was taken to OR 2 .  After anesthesia was administered and the patient was prepped  with Technicare and a timeout was performed.  Access to the abdomen was achieved with a 5 mm Optiview through the left upper quadrant a second 5 mm placed to the left umbilicus and subsequently increased to 11 larger scope using a second 5 left lateral the right and 4 right upper midline Nathanson retractor was used  exposed this recurrent hiatal hernia.  This patient had a hiatal hernia done last year and 6 weeks later had a hip replacement.  I think her hernia recurred quickly probably related to either coughing vomiting or positioning changes during her surgery.  The wrap was herniated in a obvious large defect.  The posterior repair was intact and subsequent dissection.  The most striking problem was that the adhesions into the mediastinum or thick and very particularly in the most cephalad portion and we were unable to mobilize the stomach out of the sac safely.  The rapid hernia near the pericardium and I felt that laparoscopic dissection from below was likely to result and getting out of the proper plane and into the chest approximately pericardium.  Therefore I felt that this hernia would be best repaired the left chest.  Rebecca. Windle Guard performed upper endoscopy and we were able to pass the scope easily into the stomach and retroflexed it and see the wrap appeared to be intact but it herniated through this opening and we did not see any evidence of any perforations.  Did not feel that a G-tube gastropexy will be of benefit to her at this point and will refer her for recurrent repair through the left chest.  Port sites were injected with Exparel and closed with 4-0 Monocryl and Dermabond.  The patient tolerated the procedure well and was taken to the PACU in stable condition.     Matt B. Hassell Done, MD, [facility], [place] [phone]  Preop Dx:        Type III hiatal hernia with GERD Postop Dx:      same  Procedure:      Laparoscopic Nissen fundoplication and 4 suture repair of the hiatus Location Surgery:        [facility] 4 Complications:            None seen  EBL:                            5 cc  Drains: none  Description of Procedure:             The patient was taken to OR 4 .  After anesthesia was administered and the patient was prepped a timeout was performed.  Access to the abdomen  was achieved with a 5 mm Optiview through the left upper quadrant without difficulty. 65 mm bump ports were used one of which was exchanged for a 1112 tried to the right of midline. The stomach approximately the upper third was up in the chest with a modest sized hiatal defect.  Began dissection on the right crus with a Harmonic Scalpel taking down adhesions along the right crus and going up and taken these down out of the chest and reducing this. We carried this to the midline. We then began on the left side in a similar fashion divided the sac and stripped that out from the mediastinum. We were able to mobilize at least 3 cm of the esophagus into the abdomen. We had a Penrose drain around the esophagogastric junction and freed it from its  tethering's laterally and get a good view of the right and left crura up posteriorly. These were then approximated with 3 sutures of 0 Surgidek using the Ryder System and doing extracorporeal ties.   56 lighted bougie was then passed and the upper stomach wrapped grabbed and brought around behind the esophagus and a complementary site on the anterior stomach was brought anterior. Shoeshine maneuver indicated that they were contiguous. The wrap was then performed with 3 sutures of 20 Surgidek with the uppermost one secured with a Ty knot and the 2 lower ones were intracorporeal ties. A fourth suture was placed with the 56 lighted bougie across the diaphragm anteriorly to snug up the diaphragmatic closure. A total of 4 sutures were used to secure the diaphragm.  The bougie was removed. Everything appeared to be in order. Port sites were injected with Exparel.  Incisions were closed with 4-0 Monocryl and Dermabond.   The patient tolerated the procedure well and was taken to the PACU in stable condition.     Matt B. Hassell Done, MD, [facility], [place] [phone]   Assessment / Plan:   Patient status post laparoscopic with slip Nissen with the wrap  now in the posterior mediastinum.  Laparoscopic attempt at repair was aborted.  Can consider reduction of the hernia through left chest approach.  Prior to making any definitive recommendation I will plan to discuss the case with Rebecca. Hassell Done.  And before repair consider the reasons for failure, question shortened esophagus versus breakdown of crural repair.  The approach to the left chest was discussed with the patient.  I told her that however Rebecca. Hassell Done will call her back after I have the opportunity to talk with him.     Grace Isaac MD      [address] [place] [address] Office [phone]   Beeper [phone]  07/31/2018 10:23 AM

## 2018-08-27 ENCOUNTER — Ambulatory Visit: Payer: BLUE CROSS/BLUE SHIELD

## 2018-08-27 ENCOUNTER — Ambulatory Visit
Admission: RE | Admit: 2018-08-27 | Discharge: 2018-08-27 | Disposition: A | Discharge status: Home / Self Care | Payer: BLUE CROSS/BLUE SHIELD | Source: Ambulatory Visit | Attending: Family Medicine | Admitting: Family Medicine

## 2018-08-27 DIAGNOSIS — Z1231 Encounter for screening mammogram for malignant neoplasm of breast: Secondary | ICD-10-CM | POA: Diagnosis not present

## 2018-09-14 DIAGNOSIS — R05 Cough: Secondary | ICD-10-CM | POA: Diagnosis not present

## 2018-09-14 DIAGNOSIS — J069 Acute upper respiratory infection, unspecified: Secondary | ICD-10-CM | POA: Diagnosis not present

## 2018-10-06 ENCOUNTER — Telehealth: Payer: Self-pay | Admitting: *Deleted

## 2018-10-06 ENCOUNTER — Inpatient Hospital Stay (HOSPITAL_BASED_OUTPATIENT_CLINIC_OR_DEPARTMENT_OTHER): Payer: BLUE CROSS/BLUE SHIELD | Admitting: Oncology

## 2018-10-06 ENCOUNTER — Inpatient Hospital Stay: Payer: BLUE CROSS/BLUE SHIELD | Attending: Oncology

## 2018-10-06 VITALS — BP 109/63 | HR 74 | Temp 98.4°F | Resp 17 | Ht 68.0 in | Wt 222.6 lb

## 2018-10-06 DIAGNOSIS — R1013 Epigastric pain: Secondary | ICD-10-CM | POA: Diagnosis not present

## 2018-10-06 DIAGNOSIS — R197 Diarrhea, unspecified: Secondary | ICD-10-CM | POA: Insufficient documentation

## 2018-10-06 DIAGNOSIS — D508 Other iron deficiency anemias: Secondary | ICD-10-CM | POA: Insufficient documentation

## 2018-10-06 DIAGNOSIS — D509 Iron deficiency anemia, unspecified: Secondary | ICD-10-CM

## 2018-10-06 DIAGNOSIS — K909 Intestinal malabsorption, unspecified: Secondary | ICD-10-CM | POA: Diagnosis not present

## 2018-10-06 DIAGNOSIS — Z79899 Other long term (current) drug therapy: Secondary | ICD-10-CM | POA: Diagnosis not present

## 2018-10-06 LAB — CBC WITH DIFFERENTIAL (CANCER CENTER ONLY)
Abs Immature Granulocytes: 0.02 10*3/uL (ref 0.00–0.07)
Basophils Absolute: 0.1 10*3/uL (ref 0.0–0.1)
Basophils Relative: 1 %
Eosinophils Absolute: 0.1 10*3/uL (ref 0.0–0.5)
Eosinophils Relative: 3 %
HCT: 39.4 % (ref 36.0–46.0)
Hemoglobin: 12.4 g/dL (ref 12.0–15.0)
Immature Granulocytes: 1 %
Lymphocytes Relative: 35 %
Lymphs Abs: 1.5 10*3/uL (ref 0.7–4.0)
MCH: 30.2 pg (ref 26.0–34.0)
MCHC: 31.5 g/dL (ref 30.0–36.0)
MCV: 95.9 fL (ref 80.0–100.0)
Monocytes Absolute: 0.5 10*3/uL (ref 0.1–1.0)
Monocytes Relative: 10 %
Neutro Abs: 2.2 10*3/uL (ref 1.7–7.7)
Neutrophils Relative %: 50 %
Platelet Count: 255 10*3/uL (ref 150–400)
RBC: 4.11 MIL/uL (ref 3.87–5.11)
RDW: 13.9 % (ref 11.5–15.5)
WBC Count: 4.4 10*3/uL (ref 4.0–10.5)
nRBC: 0 % (ref 0.0–0.2)

## 2018-10-06 LAB — IRON AND TIBC
Iron: 110 ug/dL (ref 41–142)
Saturation Ratios: 34 % (ref 21–57)
TIBC: 323 ug/dL (ref 236–444)
UIBC: 213 ug/dL (ref 120–384)

## 2018-10-06 LAB — FERRITIN: Ferritin: 24 ng/mL (ref 11–307)

## 2018-10-06 NOTE — Progress Notes (Signed)
Hematology and Oncology Follow Up Visit  Rebecca Marshall [id number] Apr 20, 1951 68 y.o. 10/06/2018 8:03 AM Kathyrn Lass, MDMiller, Lattie Haw, MD   Principle Diagnosis: 68 year old woman with iron deficiency anemia diagnosed in April 2019.  Her iron deficiency is related to poor absorption and chronic blood loss.  Oral iron was unsuccessful in replacing her needs.   Prior Therapy: She is status post Feraheme infusion a total of 1000 mg in July 2019.   Current therapy: Active surveillance and replacement as needed.  Interim History: Rebecca Marshall presents today for a follow-up visit.  Since last visit, she received intravenous iron in July 9528 without complications.  She also had an endoscopy in September 2019 without any active bleeding noted.  Since last visit, she feels well and she is reporting improvement in her performance status and activity level.  Continues to have GI complaints including dyspepsia and diarrhea but no active bleeding.  She does not report any headaches, blurry vision, syncope or seizures. Does not report any fevers, chills or sweats.  Does not report any cough, wheezing or hemoptysis.  Does not report any chest pain, palpitation, orthopnea or leg edema.  Does not report any nausea, vomiting or abdominal pain.  Does not report any constipation or diarrhea.  Does not report any arthralgias or myalgias.    Does not report frequency, urgency or hematuria.  Does not report any skin rashes or lesions.  Does not report any lymphadenopathy or petechiae.  Does not report any anxiety or depression.  Remaining review of systems is negative.    Medications: I have reviewed the patient's current medications.  Current Outpatient Medications  Medication Sig Dispense Refill  . acetaminophen (TYLENOL) 500 MG tablet Take 1,000 mg by mouth every 8 (eight) hours as needed for mild pain or moderate pain.     . cetirizine (ZYRTEC) 10 MG tablet Take 10 mg by mouth daily as needed for allergies.     .  cyclobenzaprine (FLEXERIL) 10 MG tablet Take 10 mg by mouth 2 (two) times daily.    Marland Kitchen eletriptan (RELPAX) 40 MG tablet Take by mouth one at the beginning of headache and may repeat once in 2  Hours for maximum of 2 tabs in 24hours 27 tablet 3  . flecainide (TAMBOCOR) 150 MG tablet Take 1 tablet (150 mg total) by mouth 2 (two) times daily. 180 tablet 3  . levothyroxine (SYNTHROID, LEVOTHROID) 137 MCG tablet Take 137 mcg by mouth daily before breakfast.    . Melatonin 10 MG TABS Take 10 mg by mouth at bedtime as needed (for sleep.).    Marland Kitchen ondansetron (ZOFRAN ODT) 4 MG disintegrating tablet 4mg  ODT q4 hours prn nausea/vomit 30 tablet 1  . prochlorperazine (COMPAZINE) 10 MG tablet Take 1 tablet (10 mg total) by mouth every 6 (six) hours as needed for nausea or vomiting (Use for nausea and / or vomiting unresolved with ondansetron (Zofran).). 20 tablet 1  . sertraline (ZOLOFT) 100 MG tablet Take 200 mg by mouth daily.    Marland Kitchen topiramate (TOPAMAX) 25 MG tablet TAKE 2 TABLETS AT BEDTIME 180 tablet 0  . traZODone (DESYREL) 100 MG tablet Take 300 mg by mouth at bedtime.    . triamcinolone cream (KENALOG) 0.1 % Apply 1 application topically 2 (two) times daily as needed (for ezcema).     No current facility-administered medications for this visit.      Allergies:  Allergies  Allergen Reactions  . Nsaids Other (See Comments)    Rectal bleeding  .  Codeine Nausea And Vomiting  . Fentanyl Other (See Comments)    "knocked out" previously with a high dose.  Can tolerate low doses.     Past Medical History, Surgical history, Social history, and Family History were reviewed and updated.    Physical Exam: Blood pressure 109/63, pulse 74, temperature 98.4 F (36.9 C), temperature source Oral, resp. rate 17, height 5\' 8"  (1.727 m), weight 222 lb 9.6 oz (101 kg), SpO2 95 %.   ECOG: 1 General appearance: alert and cooperative appeared without distress. Head: Normocephalic, without obvious  abnormality Oropharynx: No oral thrush or ulcers. Eyes: No scleral icterus.  Pupils are equal and round reactive to light. Lymph nodes: Cervical, supraclavicular, and axillary nodes normal. Heart:regular rate and rhythm, S1, S2 normal, no murmur, click, rub or gallop Lung:chest clear, no wheezing, rales, normal symmetric air entry Abdomin: soft, non-tender, without masses or organomegaly. Neurological: No motor, sensory deficits.  Intact deep tendon reflexes. Skin: No rashes or lesions.  No ecchymosis or petechiae. Musculoskeletal: No joint deformity or effusion. .    Lab Results: Lab Results  Component Value Date   WBC 11.3 (H) 06/05/2018   HGB 12.0 06/05/2018   HCT 36.5 06/05/2018   MCV 93.1 06/05/2018   PLT 204 06/05/2018     Chemistry      Component Value Date/Time   NA 142 06/01/2018 1404   K 3.6 06/01/2018 1404   CL 107 06/01/2018 1404   CO2 26 06/01/2018 1404   BUN 16 06/01/2018 1404   CREATININE 0.71 06/05/2018 1324   CREATININE 0.62 02/28/2012 1110      Component Value Date/Time   CALCIUM 8.8 (L) 06/01/2018 1404   ALKPHOS 79 09/10/2017 1500   AST 25 09/10/2017 1500   ALT 13 (L) 09/10/2017 1500   BILITOT 0.5 09/10/2017 1500        Impression and Plan:  68 year old woman with the following:  1.  Iron deficiency anemia diagnosed in April 2019.  This was due to chronic blood losses and poor iron absorption.    She is status post intravenous iron in July 2019 with correction of her hemoglobin.  Hemoglobin in September 2019 was up to 12.7.  Treatment options and differential diagnosis were reviewed today.  Given normalization of her hemoglobin and iron studies, I recommended continuous monitoring for the time being and repeat intravenous iron in the future if needed.   2.  Hiatal hernia: She underwent an endoscopy by Dr. Lucia Gaskins in September 2019 without any active bleeding.  3.  Age-appropriate cancer screening: She is up-to-date on her colon cancer  screening.  I do not believe malignancy is responsible for her chronic iron deficiency.  4.  Follow-up: We will be as needed if she develop any iron deficiency in the future.  15  minutes was spent with the patient face-to-face today.  More than 50% of time was dedicated reviewing laboratory data, differential diagnosis and management options.       Zola Button, MD 1/14/20208:03 AM

## 2018-10-06 NOTE — Telephone Encounter (Signed)
Spoke with patient, per dr Alen Blew, her iron is normal.

## 2018-10-07 ENCOUNTER — Ambulatory Visit: Payer: PPO | Admitting: Neurology

## 2018-10-07 ENCOUNTER — Telehealth: Payer: Self-pay

## 2018-10-07 NOTE — Telephone Encounter (Signed)
Per 1/14 no los °

## 2018-10-09 ENCOUNTER — Other Ambulatory Visit: Payer: Self-pay | Admitting: Surgery

## 2018-10-09 DIAGNOSIS — K219 Gastro-esophageal reflux disease without esophagitis: Secondary | ICD-10-CM

## 2018-10-09 DIAGNOSIS — K449 Diaphragmatic hernia without obstruction or gangrene: Principal | ICD-10-CM

## 2018-10-13 ENCOUNTER — Ambulatory Visit
Admission: RE | Admit: 2018-10-13 | Discharge: 2018-10-13 | Disposition: A | Discharge status: Home / Self Care | Payer: BLUE CROSS/BLUE SHIELD | Source: Ambulatory Visit | Attending: Surgery | Admitting: Surgery

## 2018-10-13 DIAGNOSIS — Z23 Encounter for immunization: Secondary | ICD-10-CM | POA: Diagnosis not present

## 2018-10-13 DIAGNOSIS — K219 Gastro-esophageal reflux disease without esophagitis: Secondary | ICD-10-CM

## 2018-10-13 DIAGNOSIS — R05 Cough: Secondary | ICD-10-CM | POA: Diagnosis not present

## 2018-10-13 DIAGNOSIS — K449 Diaphragmatic hernia without obstruction or gangrene: Secondary | ICD-10-CM | POA: Diagnosis not present

## 2018-10-16 ENCOUNTER — Other Ambulatory Visit: Payer: Self-pay | Admitting: Family Medicine

## 2018-10-16 DIAGNOSIS — R05 Cough: Secondary | ICD-10-CM

## 2018-10-16 DIAGNOSIS — R053 Chronic cough: Secondary | ICD-10-CM

## 2018-10-19 ENCOUNTER — Other Ambulatory Visit: Payer: Self-pay | Admitting: Family Medicine

## 2018-10-20 ENCOUNTER — Other Ambulatory Visit: Payer: Self-pay | Admitting: Family Medicine

## 2018-10-21 ENCOUNTER — Ambulatory Visit
Admission: RE | Admit: 2018-10-21 | Discharge: 2018-10-21 | Disposition: A | Discharge status: Home / Self Care | Payer: BLUE CROSS/BLUE SHIELD | Source: Ambulatory Visit | Attending: Family Medicine | Admitting: Family Medicine

## 2018-10-21 ENCOUNTER — Other Ambulatory Visit: Payer: Self-pay | Admitting: Family Medicine

## 2018-10-21 DIAGNOSIS — R05 Cough: Secondary | ICD-10-CM

## 2018-10-21 DIAGNOSIS — R918 Other nonspecific abnormal finding of lung field: Secondary | ICD-10-CM | POA: Diagnosis not present

## 2018-10-21 DIAGNOSIS — R053 Chronic cough: Secondary | ICD-10-CM

## 2018-11-03 ENCOUNTER — Ambulatory Visit (INDEPENDENT_AMBULATORY_CARE_PROVIDER_SITE_OTHER): Payer: BLUE CROSS/BLUE SHIELD | Admitting: Pulmonary Disease

## 2018-11-03 ENCOUNTER — Encounter: Payer: Self-pay | Admitting: Pulmonary Disease

## 2018-11-03 VITALS — BP 94/60 | HR 78 | Ht 66.25 in | Wt 223.6 lb

## 2018-11-03 DIAGNOSIS — R05 Cough: Secondary | ICD-10-CM | POA: Diagnosis not present

## 2018-11-03 DIAGNOSIS — Z9889 Other specified postprocedural states: Secondary | ICD-10-CM

## 2018-11-03 DIAGNOSIS — R059 Cough, unspecified: Secondary | ICD-10-CM

## 2018-11-03 DIAGNOSIS — R918 Other nonspecific abnormal finding of lung field: Secondary | ICD-10-CM

## 2018-11-03 DIAGNOSIS — K449 Diaphragmatic hernia without obstruction or gangrene: Secondary | ICD-10-CM | POA: Diagnosis not present

## 2018-11-03 DIAGNOSIS — R140 Abdominal distension (gaseous): Secondary | ICD-10-CM | POA: Diagnosis not present

## 2018-11-03 DIAGNOSIS — R1013 Epigastric pain: Secondary | ICD-10-CM | POA: Diagnosis not present

## 2018-11-03 DIAGNOSIS — R911 Solitary pulmonary nodule: Secondary | ICD-10-CM | POA: Diagnosis not present

## 2018-11-03 MED ORDER — OMEPRAZOLE 20 MG PO CPDR: 20.0000 mg | DELAYED_RELEASE_CAPSULE | Freq: Two times a day (BID) | ORAL | 3 refills | Status: DC

## 2018-11-03 MED FILL — OMEPRAZOLE 20 MG CPDR: 20 | 30 days supply | Qty: 60 | Fill #0

## 2018-11-03 NOTE — Progress Notes (Signed)
Synopsis: Referred in Feb 2020 for chronic cough by Kathyrn Lass, MD  Subjective:   PATIENT ID: Rebecca Marshall GENDER: female DOB: [birth year], MRN: [id number]  Chief Complaint  Patient presents with  . 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 Randomly started coughing about 4 months ago. She was treated with abx for a week. She took ppi, omeprazole for about a week. She has a persistent hiatal hernia and had a nissen fundoplication that hasn't helped. Life long non-smoker, tried it in [employer]. Retired Marine scientist, worked for [employer], home care, dialysis. No birds, pets dogs or cats in the house. Second hand smoke exposure from parents. Husband with former working on railroad, dust exposure with items and clothing brought home, however this was many years ago.  Reviewed her CT scan today in the office which reveals enlarged hiatal hernia still present.  There is significant amount of food particles present within the stomach.  She states that she had a CT scan done in the morning.  And did not eat that much for breakfast.  Leaving the concern whether or not she has significant amount of retained food within the stomach.  She does state that her cough is much worse at night.  She lays flat at night.  She has not been using elevation of the head of the bed.  She has been told to do this in the past.  She was on PPI at one point in time twice a day.  She had a attempted Nissen fundoplication back approximately a year and a half ago.  At this point her symptoms are approximately the same as they were before.  But now she has persistent nocturnal cough.  She does use a opiate cough suppressant at nighttime which helps.   Past Medical  History:  Diagnosis Date  . Acne nodule 11/26/2011  . ADENOCARCINOMA, THYROID GLAND, PAPILLARY 05/17/2009   04/2009 Two surgeries for thyroid cancer Path report from last surgery :THYROID, LEFT, HEMITHYROIDECTOMY: - MULTIPLE FOCI OF FOLLICULAR VARIANT OR PAPILLARY THYROID CARCINOMA, 1.7 CM IN GREATEST DIMENSION, CONFINED WITHIN THE THYROID TISSUE. - ANGIOLYMPHATIC INVASION PRESENT. - ISTHMUS RESECTION MARGIN INVOLVED BY TUMOR. - FOLLICULAR ADENOMA(S) WITH ASSOCIATED LYMPHOCYTIC THYROIDITIS.     . AK (actinic keratosis) 04/16/2012  . ALLERGIC RHINITIS 01/30/2009   Qualifier: Diagnosis of  By: Andria Frames MD, Gwyndolyn Saxon    . Anxiety   . ANXIETY DEPRESSION 09/03/2010   Long standing depression with anxiety. Good response to medications initially. Has been to [facility].    Marland Kitchen Arthritis   . 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 08/11/2007   LEFT XBD:ZHGDJ hip replacement (Dr Berenice Primas) with recalled hardware. Had revision by Dr Berenice Primas in 2011 with  ASR .Marland Kitchen    Marland Kitchen Atrial dysrhythmia    FOLLOWED BY DR Caryl Comes WAS HAVING PALPITATIONS AND SYNCOPAL EPISODES ONSET 9 YEARS AGO ; STARTED ON FLECAIINIDE , PER PATIENT TOLERATES WELL, LAST SYNCOPAL EPISODE WAS OVER 5 YEARS AGO ; SEE LOV IN  EPIC   . Atrial tachycardia ([place]) 06/05/2011   \Treated with flecainide-2011 2010-QRS duration 88 ms 2011 QRS duration 88 ms 2014 QRS duration 98 ms 2014 QRS 120mg    120 ms    . Bilateral bunions 12/18/2010  . Bilateral knee pain 06/29/2015  .  Cancer [facility])    thyroid-status post resection on replacement Dr. Chalmers Cater  . Clinical depression 03/06/2016  . Cognitive changes 10/12/2014  . Complication of anesthesia    FENTANYL CAUSED ANAPHYLAXIS DURING CA COLONOSCOPY  . Depression   . Dysrhythmia    Dr Caryl Comes monitoring patient, atrial tachycardia under control, cardiac clearance 09/10/17  . Family history of diabetes mellitus (DM) 05/11/2014  . Fibromyalgia   . Gait abnormality 12/18/2010  . GE reflux   . GERD 07/14/2007   Qualifier: Diagnosis of   By: Nori Riis MD, Clarise Cruz    . Headache    migraines  . Headache upon awakening 06/05/2011  . Herniated lumbar disc without myelopathy 12/10/2013   Had surgery by neurosurgery in December, 2014.L4-5 laminotomy and microdiscectomy Dr Margreta Journey Done at [facility]   . Hip pain, chronic 04/05/2015   Status post THR plus revision.   . Hypoxemia 01/30/2015  . Memory change 05/12/2014   Reports problems with short-term memory, focusing, concentration in the last 6-12 months. Problems were significant enough that she had to quit her part-time job is fairly distressed about this.   . Metatarsalgia 12/18/2010  . Nausea & vomiting 06/05/2011  . Nocturia more than twice per night 01/30/2015  . Nonallopathic lesion of cervical region 11/26/2011  . Nonallopathic lesion of lumbosacral region 11/26/2011  . OSA on CPAP 01/30/2015  . Osteoporosis   . PONV (postoperative nausea and vomiting)    did not have nausea/vomiting with nissen fundoplication done 00/9233 at [facility]  . Poor compliance with CPAP treatment 03/24/2017  . Rash and nonspecific skin eruption 01/09/2016    located in the 10 o'clock position on Right breast    . Severe obesity (BMI >= 40) ([place]) 01/30/2015  . Sleep apnea 10/2015   x 1 year, CPAP  . Snorings 01/30/2015  . Status post Nissen fundoplication 00/03/6225  . Subacute cough 03/27/2012  . Thyroid disease   . Well adult health check 06/05/2011     Family History  Problem Relation Age of Onset  . Cancer Other   . Alcohol abuse Mother   . Cancer Mother 6       Lung   . Alcohol abuse Father   . Cancer Sister 102       Breast     Past Surgical History:  Procedure Laterality Date  . ABDOMINAL HYSTERECTOMY    . BREAST EXCISIONAL BIOPSY Right 25+ yrs ago   benign  . caroitid dopplers Bilateral 11/2015   normal  . CATARACT EXTRACTION Bilateral 09/05/15, 09/12/15  . CHOLECYSTECTOMY    . COLONOSCOPY  04/2011  . ESOPHAGEAL MANOMETRY N/A 03/31/2017   Procedure: ESOPHAGEAL MANOMETRY (EM);   Surgeon: Ronald Lobo, MD;  Location: [facility];  Service: Endoscopy;  Laterality: N/A;  . HIATAL HERNIA REPAIR N/A 06/05/2018   Procedure: LAPAROSCOPIC  ENTERO-LYSIS  ERAS PATHWAY;  Surgeon: Johnathan Hausen, MD;  Location: [facility];  Service: General;  Laterality: N/A;  . LAPAROSCOPIC NISSEN FUNDOPLICATION N/A 33/11/5454   Procedure: LAPAROSCOPIC NISSEN FUNDOPLICATION;  Surgeon: Johnathan Hausen, MD;  Location: [facility];  Service: General;  Laterality: N/A;  . THYROIDECTOMY  04/2009   papillary [place] Dr. Erik Obey  . TOE SURGERY     bone spur removed - rt great toe  . TOTAL HIP ARTHROPLASTY  2009 and 2011   L hip   . TOTAL HIP ARTHROPLASTY Right 09/12/2017   Procedure: TOTAL HIP ARTHROPLASTY ANTERIOR APPROACH;  Surgeon: Dorna Leitz, MD;  Location: [place];  Service: Orthopedics;  Laterality: Right;  . TUBAL LIGATION    . UPPER GI ENDOSCOPY  06/05/2018   Procedure: UPPER GI ENDOSCOPY;  Surgeon: Johnathan Hausen, MD;  Location: [facility];  Service: General;;    Social History   Socioeconomic History  . Marital status: Married    Spouse name: [name]  . Number of children: 3  . Years of education: 12+  . Highest education level: Not on file  Occupational History  . Occupation: unemployed   Social Needs  . Financial resource strain: Not on file  . Food insecurity:    Worry: Not on file    Inability: Not on file  . Transportation needs:    Medical: Not on file    Non-medical: Not on file  Tobacco Use  . Smoking status: Never Smoker  . Smokeless tobacco: Never Used  Substance and Sexual Activity  . Alcohol use: No    Alcohol/week: 0.0 standard drinks    Comment: none  . Drug use: No  . Sexual activity: Not on file  Lifestyle  . Physical activity:    Days per week: Not on file    Minutes per session: Not on file  . Stress: Not on file  Relationships  . Social connections:    Talks on phone: Not on file    Gets together: Not on file    Attends religious service: Not on file     Active member of club or organization: Not on file    Attends meetings of clubs or organizations: Not on file    Relationship status: Not on file  . Intimate partner violence:    Fear of current or ex partner: Not on file    Emotionally abused: Not on file    Physically abused: Not on file    Forced sexual activity: Not on file  Other Topics Concern  . Not on file  Social History Narrative   Patient lives at home alone    Patient is married    Patient is not working    Patient has 3 children    Patient has her BSN RN   Patient is right handed       Daily caffeine use 1 cup daily.     Allergies  Allergen Reactions  . Nsaids Other (See Comments)    Rectal bleeding  . Codeine Nausea And Vomiting  . Fentanyl Other (See Comments)    "knocked out" previously with a high dose.  Can tolerate low doses.      Outpatient Medications Prior to Visit  Medication Sig Dispense Refill  . acetaminophen (TYLENOL) 500 MG tablet Take 1,000 mg by mouth every 8 (eight) hours as needed for mild pain or moderate pain.     . cetirizine (ZYRTEC) 10 MG tablet Take 10 mg by mouth daily as needed for allergies.     . cyclobenzaprine (FLEXERIL) 10 MG tablet Take 10 mg by mouth 2 (two) times daily.    Marland Kitchen eletriptan (RELPAX) 40 MG tablet Take by mouth one at the beginning of headache and may repeat once in 2  Hours for maximum of 2 tabs in 24hours 27 tablet 3  . flecainide (TAMBOCOR) 150 MG tablet Take 1 tablet (150 mg total) by mouth 2 (two) times daily. 180 tablet 3  . levothyroxine (SYNTHROID, LEVOTHROID) 137 MCG tablet Take 137 mcg by mouth daily before breakfast.    . Melatonin 10 MG TABS Take 10 mg by mouth at bedtime as needed (for sleep.).    Marland Kitchen  sertraline (ZOLOFT) 100 MG tablet Take 200 mg by mouth daily.    Marland Kitchen topiramate (TOPAMAX) 25 MG tablet TAKE 2 TABLETS AT BEDTIME 180 tablet 0  . traZODone (DESYREL) 100 MG tablet Take 300 mg by mouth at bedtime.    . ondansetron (ZOFRAN ODT) 4 MG disintegrating  tablet 4mg  ODT q4 hours prn nausea/vomit (Patient not taking: Reported on 11/03/2018) 30 tablet 1  . prochlorperazine (COMPAZINE) 10 MG tablet Take 1 tablet (10 mg total) by mouth every 6 (six) hours as needed for nausea or vomiting (Use for nausea and / or vomiting unresolved with ondansetron (Zofran).). (Patient not taking: Reported on 11/03/2018) 20 tablet 1  . triamcinolone cream (KENALOG) 0.1 % Apply 1 application topically 2 (two) times daily as needed (for ezcema).     No facility-administered medications prior to visit.     Review of Systems  Constitutional: Negative for chills, fever, malaise/fatigue and weight loss.  HENT: Negative for hearing loss, sore throat and tinnitus.   Eyes: Negative for blurred vision and double vision.  Respiratory: Positive for cough. Negative for hemoptysis, sputum production, shortness of breath, wheezing and stridor.   Cardiovascular: Negative for chest pain, palpitations, orthopnea, leg swelling and PND.  Gastrointestinal: Positive for abdominal pain, heartburn and nausea. Negative for constipation, diarrhea and vomiting.       Bloating   Genitourinary: Negative for dysuria, hematuria and urgency.  Musculoskeletal: Negative for joint pain and myalgias.  Skin: Negative for itching and rash.  Neurological: Negative for dizziness, tingling, weakness and headaches.  Endo/Heme/Allergies: Negative for environmental allergies. Does not bruise/bleed easily.  Psychiatric/Behavioral: Negative for depression. The patient is not nervous/anxious and does not have insomnia.   All other systems reviewed and are negative.    Objective:  Physical Exam Vitals signs reviewed.  Constitutional:      General: She is not in acute distress.    Appearance: She is well-developed. She is obese.  HENT:     Head: Normocephalic and atraumatic.  Eyes:     General: No scleral icterus.    Conjunctiva/sclera: Conjunctivae normal.     Pupils: Pupils are equal, round, and  reactive to light.  Neck:     Musculoskeletal: Neck supple.     Vascular: No JVD.     Trachea: No tracheal deviation.  Cardiovascular:     Rate and Rhythm: Normal rate and regular rhythm.     Heart sounds: Normal heart sounds. No murmur.  Pulmonary:     Effort: Pulmonary effort is normal. No tachypnea, accessory muscle usage or respiratory distress.     Breath sounds: Normal breath sounds. No stridor. No wheezing, rhonchi or rales.  Abdominal:     General: Bowel sounds are normal. There is no distension.     Palpations: Abdomen is soft.     Tenderness: There is no abdominal tenderness.     Comments: Obese pannus  Musculoskeletal:        General: No tenderness.  Lymphadenopathy:     Cervical: No cervical adenopathy.  Skin:    General: Skin is warm and dry.     Capillary Refill: Capillary refill takes less than 2 seconds.     Findings: No rash.  Neurological:     Mental Status: She is alert and oriented to person, place, and time.  Psychiatric:        Behavior: Behavior normal.      Vitals:   11/03/18 0904  BP: 94/60  Pulse: 78  SpO2: 96%  Weight:  223 lb 9.6 oz (101.4 kg)  Height: 5' 6.25" (1.683 m)   96% on RA BMI Readings from Last 3 Encounters:  11/03/18 35.82 kg/m  10/06/18 33.85 kg/m  07/31/18 33.75 kg/m   Wt Readings from Last 3 Encounters:  11/03/18 223 lb 9.6 oz (101.4 kg)  10/06/18 222 lb 9.6 oz (101 kg)  07/31/18 222 lb (100.7 kg)     CBC    Component Value Date/Time   WBC 4.4 10/06/2018 0756   WBC 11.3 (H) 06/05/2018 1324   RBC 4.11 10/06/2018 0756   HGB 12.4 10/06/2018 0756   HCT 39.4 10/06/2018 0756   PLT 255 10/06/2018 0756   MCV 95.9 10/06/2018 0756   MCH 30.2 10/06/2018 0756   MCHC 31.5 10/06/2018 0756   RDW 13.9 10/06/2018 0756   LYMPHSABS 1.5 10/06/2018 0756   MONOABS 0.5 10/06/2018 0756   EOSABS 0.1 10/06/2018 0756   BASOSABS 0.1 10/06/2018 0756    Chest Imaging: 10/22/2018 CT chest without contrast: Found to have a 2.5 cm  groundglass opacity within the peripheral left upper lobe.  Recommending 55-month follow-up. The patient's images have been independently reviewed by me.    Pulmonary Functions Testing Results: No flowsheet data found.  FeNO: None   Pathology: None   Echocardiogram:  12/06/2015 ejection fraction 60 to 89%, grade 2 diastolic dysfunction  Heart Catheterization: None     Assessment & Plan:   Left upper lobe pulmonary nodule - 2.5cm ground glass predominate  - Plan: CT Chest Wo Contrast  Abnormal findings on diagnostic imaging of lung - Plan: CT Chest Wo Contrast  Hiatal hernia  Cough  History of Nissen fundoplication  Bloating  Epigastric pain  Discussion:  This is a 68 year old female with an incidentally found left upper lobe pulmonary nodule after CT scan for evaluation of chronic cough for the past 4 months.  Her medical history is significant for a hiatal hernia as well as history of esophageal reflux requiring a attempt at Nissen fundoplication which she states the surgery was not successful.  She has looked into having an additional surgeon do a repeat operation.  This is currently under evaluation.  She is now been experiencing chronic cough.  Of note CT scan that was completed showed a significant amount of stomach within the chest cavity as well as containing food particles.  The scan was completed in the morning.  And the patient states that she had not eaten anything that she knew of since the night before the CT scan.  This would suggest that the food had remained within her stomach overnight.  She does not sleep with an elevated incline.  Her symptoms are worse at night.  I would suggest that most of her cough is related to recurrent gastroesophageal reflux and her persistent hiatal hernia.  At this time we will start her on a PPI twice daily.  As for the left upper lobe lung nodule we will have a repeat CAT scan in 3 months. We also discussed the importance of sleeping  with the head of the bed inclined as well as recommendations for a gastroesophageal reflux disease diet.  Patient was given handout today in the office.  Patient to return to clinic in 3 months following CT scan of the chest.  Greater than 50% of this patient's 60-minute of visit was been face-to-face discussing above recommendations and treatment plan as well as reviewing patient's images with them at bedside.   Current Outpatient Medications:  .  acetaminophen (  TYLENOL) 500 MG tablet, Take 1,000 mg by mouth every 8 (eight) hours as needed for mild pain or moderate pain. , Disp: , Rfl:  .  cetirizine (ZYRTEC) 10 MG tablet, Take 10 mg by mouth daily as needed for allergies. , Disp: , Rfl:  .  cyclobenzaprine (FLEXERIL) 10 MG tablet, Take 10 mg by mouth 2 (two) times daily., Disp: , Rfl:  .  eletriptan (RELPAX) 40 MG tablet, Take by mouth one at the beginning of headache and may repeat once in 2  Hours for maximum of 2 tabs in 24hours, Disp: 27 tablet, Rfl: 3 .  flecainide (TAMBOCOR) 150 MG tablet, Take 1 tablet (150 mg total) by mouth 2 (two) times daily., Disp: 180 tablet, Rfl: 3 .  levothyroxine (SYNTHROID, LEVOTHROID) 137 MCG tablet, Take 137 mcg by mouth daily before breakfast., Disp: , Rfl:  .  Melatonin 10 MG TABS, Take 10 mg by mouth at bedtime as needed (for sleep.)., Disp: , Rfl:  .  sertraline (ZOLOFT) 100 MG tablet, Take 200 mg by mouth daily., Disp: , Rfl:  .  topiramate (TOPAMAX) 25 MG tablet, TAKE 2 TABLETS AT BEDTIME, Disp: 180 tablet, Rfl: 0 .  traZODone (DESYREL) 100 MG tablet, Take 300 mg by mouth at bedtime., Disp: , Rfl:  .  omeprazole (PRILOSEC) 20 MG capsule, Take 1 capsule (20 mg total) by mouth 2 (two) times daily before a meal for 30 days., Disp: 60 capsule, Rfl: 3 .  ondansetron (ZOFRAN ODT) 4 MG disintegrating tablet, 4mg  ODT q4 hours prn nausea/vomit (Patient not taking: Reported on 11/03/2018), Disp: 30 tablet, Rfl: 1 .  prochlorperazine (COMPAZINE) 10 MG tablet, Take  1 tablet (10 mg total) by mouth every 6 (six) hours as needed for nausea or vomiting (Use for nausea and / or vomiting unresolved with ondansetron (Zofran).). (Patient not taking: Reported on 11/03/2018), Disp: 20 tablet, Rfl: 1 .  triamcinolone cream (KENALOG) 0.1 %, Apply 1 application topically 2 (two) times daily as needed (for ezcema)., Disp: , Rfl:    Garner Nash, DO [facility] 11/03/2018 9:41 AM

## 2018-11-03 NOTE — Patient Instructions (Signed)
Thank you for visiting Dr. Valeta Harms at [facility]. Today we recommend the following: Orders Placed This Encounter  Procedures  . CT Chest Wo Contrast   Meds ordered this encounter  Medications  . omeprazole (PRILOSEC) 20 MG capsule    Sig: Take 1 capsule (20 mg total) by mouth 2 (two) times daily before a meal for 30 days.    Dispense:  60 capsule    Refill:  3   Continue elevation of head of bed and GERD diet.   Return in about 3 months (around 02/01/2019). Following completion of CT Chest.    Food Choices for Gastroesophageal Reflux Disease, Adult When you have gastroesophageal reflux disease (GERD), the foods you eat and your eating habits are very important. Choosing the right foods can help ease your discomfort. Think about working with a nutrition specialist (dietitian) to help you make good choices. What are tips for following this plan?  Meals  Choose healthy foods that are low in fat, such as fruits, vegetables, whole grains, low-fat dairy products, and lean meat, fish, and poultry.  Eat small meals often instead of 3 large meals a day. Eat your meals slowly, and in a place where you are relaxed. Avoid bending over or lying down until 2-3 hours after eating.  Avoid eating meals 2-3 hours before bed.  Avoid drinking a lot of liquid with meals.  Cook foods using methods other than frying. Bake, grill, or broil food instead.  Avoid or limit: ? Chocolate. ? Peppermint or spearmint. ? Alcohol. ? Pepper. ? Black and decaffeinated coffee. ? Black and decaffeinated tea. ? Bubbly (carbonated) soft drinks. ? Caffeinated energy drinks and soft drinks.  Limit high-fat foods such as: ? Fatty meat or fried foods. ? Whole milk, cream, butter, or ice cream. ? Nuts and nut butters. ? Pastries, donuts, and sweets made with butter or shortening.  Avoid foods that cause symptoms. These foods may be different for everyone. Common foods that cause symptoms  include: ? Tomatoes. ? Oranges, lemons, and limes. ? Peppers. ? Spicy food. ? Onions and garlic. ? Vinegar. Lifestyle  Maintain a healthy weight. Ask your doctor what weight is healthy for you. If you need to lose weight, work with your doctor to do so safely.  Exercise for at least 30 minutes for 5 or more days each week, or as told by your doctor.  Wear loose-fitting clothes.  Do not smoke. If you need help quitting, ask your doctor.  Sleep with the head of your bed higher than your feet. Use a wedge under the mattress or blocks under the bed frame to raise the head of the bed. Summary  When you have gastroesophageal reflux disease (GERD), food and lifestyle choices are very important in easing your symptoms.  Eat small meals often instead of 3 large meals a day. Eat your meals slowly, and in a place where you are relaxed.  Limit high-fat foods such as fatty meat or fried foods.  Avoid bending over or lying down until 2-3 hours after eating.  Avoid peppermint and spearmint, caffeine, alcohol, and chocolate. This information is not intended to replace advice given to you by your health care provider. Make sure you discuss any questions you have with your health care provider. Document Released: 03/10/2012 Document Revised: 10/15/2016 Document Reviewed: 10/15/2016 Elsevier Interactive Patient Education  2019 [employer].

## 2018-12-02 MED FILL — OMEPRAZOLE 20 MG CPDR: 20 | 30 days supply | Qty: 60 | Fill #1

## 2018-12-04 ENCOUNTER — Ambulatory Visit (INDEPENDENT_AMBULATORY_CARE_PROVIDER_SITE_OTHER): Payer: PPO

## 2018-12-04 ENCOUNTER — Other Ambulatory Visit: Payer: Self-pay

## 2018-12-04 ENCOUNTER — Encounter: Payer: Self-pay | Admitting: Podiatry

## 2018-12-04 ENCOUNTER — Other Ambulatory Visit: Payer: Self-pay | Admitting: Podiatry

## 2018-12-04 ENCOUNTER — Ambulatory Visit (INDEPENDENT_AMBULATORY_CARE_PROVIDER_SITE_OTHER): Payer: PPO | Admitting: Podiatry

## 2018-12-04 VITALS — BP 122/74

## 2018-12-04 DIAGNOSIS — M79672 Pain in left foot: Principal | ICD-10-CM

## 2018-12-04 DIAGNOSIS — M2041 Other hammer toe(s) (acquired), right foot: Secondary | ICD-10-CM

## 2018-12-04 DIAGNOSIS — M2042 Other hammer toe(s) (acquired), left foot: Secondary | ICD-10-CM | POA: Diagnosis not present

## 2018-12-04 DIAGNOSIS — M2040 Other hammer toe(s) (acquired), unspecified foot: Secondary | ICD-10-CM

## 2018-12-04 DIAGNOSIS — M79671 Pain in right foot: Secondary | ICD-10-CM

## 2018-12-04 DIAGNOSIS — M205X1 Other deformities of toe(s) (acquired), right foot: Secondary | ICD-10-CM | POA: Diagnosis not present

## 2018-12-04 NOTE — Progress Notes (Signed)
Subjective:   Patient ID: Rebecca Marshall, female   DOB: 68 y.o.   MRN: [id number]   HPI Patient presents with chronic foot pain right stating that her toe went out of position approximately 10 years ago and is worsened over that time.  She is tried wider shoes padding the area and also has a lot of pain in her big toe joint right and had previous surgery which is no longer helpful with the procedure being done 15 years ago.  Patient does not smoke likes to be active   Review of Systems  All other systems reviewed and are negative.       Objective:  Physical Exam Vitals signs and nursing note reviewed.  Constitutional:      Appearance: She is well-developed.  Pulmonary:     Effort: Pulmonary effort is normal.  Musculoskeletal: Normal range of motion.  Skin:    General: Skin is warm.  Neurological:     Mental Status: She is alert.     Neurovascular status intact muscle strength is adequate range of motion within normal limits with patient found to have a severely elevated second digit right with transverse sagittal plane deformity and keratotic lesion that is painful when pressed with deformity also of the third digit which is followed the second digit quite a bit of inflammation of the second metatarsal phalangeal joint with pain and significant loss of motion first MPJ right with arthritis of the joint surface and pain with palpation.  Patient has good digital perfusion and is well oriented x3     Assessment:  Severe hallux rigidus deformity right with chronic arthritis flexor plate dislocation with rigid contracture and pain digit to right and elongated second metatarsal with capsulitis and hammertoe deformity third digit right.     Plan:  H&P all conditions reviewed discussed.  Patient would like to have this corrected due to long term intense discomfort and failure to respond conservatively.  I did review this and she needs to get it done as soon as possible and at this point I  discussed implant procedure first MPJ shortening osteotomy second right with digital fusion and fusion digit 3 right.  I spent a great deal of time going over the consent form and reviewing all possible complications as outlined and the fact that it will be difficult to get the second toe in complete alignment.  Patient wants surgery understanding risk and at this point signed consent form after extensive review and understands there is no guarantee as to what will be able to accomplish and we are can have to replace the big toe joint.  She is willing to accept this risk wants surgery signed consent form and is scheduled for outpatient surgery at this time and is encouraged to call with any questions prior to procedure understanding total recovery take 6 months to 1 year.  I dispensed air fracture walker with all instructions on usage and encouraged her to call us if any questions should come up  X-ray indicates significant structural malalignment right over left with arthritis and narrowing of the first MPJ right with multiple signs of arthritis and severe dislocation second MPJ right and hammertoe deformity third right with elongated second metatarsal right

## 2018-12-04 NOTE — Patient Instructions (Addendum)
Pre-Operative Instructions  Congratulations, you have decided to take an important step towards improving your quality of life.  You can be assured that the doctors and staff at [facility] will be with you every step of the way.  Here are some important things you should know:  1. Plan to be at the surgery center/hospital at least 1 (one) hour prior to your scheduled time, unless otherwise directed by the surgical center/hospital staff.  You must have a responsible adult accompany you, remain during the surgery and drive you home.  Make sure you have directions to the surgical center/hospital to ensure you arrive on time. 2. If you are having surgery at [facility] or [facility], you will need a copy of your medical history and physical form from your family physician within one month prior to the date of surgery. We will give you a form for your primary physician to complete.  3. We make every effort to accommodate the date you request for surgery.  However, there are times where surgery dates or times have to be moved.  We will contact you as soon as possible if a change in schedule is required.   4. No aspirin/ibuprofen for one week before surgery.  If you are on aspirin, any non-steroidal anti-inflammatory medications (Mobic, Aleve, Ibuprofen) should not be taken seven (7) days prior to your surgery.  You make take Tylenol for pain prior to surgery.  5. Medications - If you are taking daily heart and blood pressure medications, seizure, reflux, allergy, asthma, anxiety, pain or diabetes medications, make sure you notify the surgery center/hospital before the day of surgery so they can tell you which medications you should take or avoid the day of surgery. 6. No food or drink after midnight the night before surgery unless directed otherwise by surgical center/hospital staff. 7. No alcoholic beverages 24-hours prior to surgery.  No smoking 24-hours prior or 24-hours after  surgery. 8. Wear loose pants or shorts. They should be loose enough to fit over bandages, boots, and casts. 9. Don't wear slip-on shoes. Sneakers are preferred. 10. Bring your boot with you to the surgery center/hospital.  Also bring crutches or a walker if your physician has prescribed it for you.  If you do not have this equipment, it will be provided for you after surgery. 11. If you have not been contacted by the surgery center/hospital by the day before your surgery, call to confirm the date and time of your surgery. 12. Leave-time from work may vary depending on the type of surgery you have.  Appropriate arrangements should be made prior to surgery with your employer. 13. Prescriptions will be provided immediately following surgery by your doctor.  Fill these as soon as possible after surgery and take the medication as directed. Pain medications will not be refilled on weekends and must be approved by the doctor. 14. Remove nail polish on the operative foot and avoid getting pedicures prior to surgery. 15. Wash the night before surgery.  The night before surgery wash the foot and leg well with water and the antibacterial soap provided. Be sure to pay special attention to beneath the toenails and in between the toes.  Wash for at least three (3) minutes. Rinse thoroughly with water and dry well with a towel.  Perform this wash unless told not to do so by your physician.  Enclosed: 1 Ice pack (please put in freezer the night before surgery)   1 Hibiclens skin cleaner     Pre-op instructions  If you have any questions regarding the instructions, please do not hesitate to call our office.  [place]: [address], [place], [place] [address] -- [phone]  [place]: [address] , [place] [address] -- [phone]  [place]: [address]  [place], [place] [address] -- [phone]  [place]: [address], [address], [place], [place] [address] -- [phone]  Website:  [email or web address] 

## 2018-12-08 ENCOUNTER — Encounter: Payer: Self-pay | Admitting: Podiatry

## 2018-12-08 DIAGNOSIS — I499 Cardiac arrhythmia, unspecified: Secondary | ICD-10-CM | POA: Diagnosis not present

## 2018-12-08 DIAGNOSIS — M21541 Acquired clubfoot, right foot: Secondary | ICD-10-CM | POA: Diagnosis not present

## 2018-12-08 DIAGNOSIS — M2021 Hallux rigidus, right foot: Secondary | ICD-10-CM | POA: Diagnosis not present

## 2018-12-08 DIAGNOSIS — M216X1 Other acquired deformities of right foot: Secondary | ICD-10-CM | POA: Diagnosis not present

## 2018-12-08 DIAGNOSIS — M2031 Hallux varus (acquired), right foot: Secondary | ICD-10-CM | POA: Diagnosis not present

## 2018-12-08 DIAGNOSIS — M2041 Other hammer toe(s) (acquired), right foot: Secondary | ICD-10-CM | POA: Diagnosis not present

## 2018-12-08 MED FILL — HYDROmorphone HCL 4 MG TABS: 4 | 4 days supply | Qty: 25 | Fill #0

## 2018-12-09 ENCOUNTER — Telehealth: Payer: Self-pay | Admitting: *Deleted

## 2018-12-09 NOTE — Telephone Encounter (Signed)
Called and spoke with the patient and patient stated the pain was about a 7 this am and took some pain medicine and has no fever or chills and no nausea and has elevated and used an ice pack and I stated to call the [facility] office if any concerns or questions at [phone]. Lattie Haw

## 2018-12-17 ENCOUNTER — Ambulatory Visit (INDEPENDENT_AMBULATORY_CARE_PROVIDER_SITE_OTHER): Payer: BLUE CROSS/BLUE SHIELD

## 2018-12-17 ENCOUNTER — Other Ambulatory Visit: Payer: Self-pay

## 2018-12-17 ENCOUNTER — Ambulatory Visit (INDEPENDENT_AMBULATORY_CARE_PROVIDER_SITE_OTHER): Payer: PPO | Admitting: Podiatry

## 2018-12-17 ENCOUNTER — Encounter: Payer: Self-pay | Admitting: Podiatry

## 2018-12-17 VITALS — BP 107/70 | HR 73 | Temp 99.1°F | Resp 16

## 2018-12-17 DIAGNOSIS — M2041 Other hammer toe(s) (acquired), right foot: Secondary | ICD-10-CM | POA: Diagnosis not present

## 2018-12-17 NOTE — Progress Notes (Signed)
Subjective:   Patient ID: Rebecca Marshall, female   DOB: 68 y.o.   MRN: [id number]   HPI Patient states doing very well with surgery and very happy so far with how things are going   ROS      Objective:  Physical Exam  Neurovascular status intact with patient's right foot healing well with good alignment noted digits in good alignment and good range of motion first MPJ with pins in place     Assessment:  Doing well post forefoot surgery right with good alignment noted     Plan:  H&P x-rays reviewed and at this point sterile dressing reapplied continue elevation compression immobilization and reappoint to recheck  X-ray indicates fixation in place screw in place implant in place with good alignment noted and no indication of pathology

## 2018-12-18 ENCOUNTER — Other Ambulatory Visit: Payer: PPO

## 2018-12-21 ENCOUNTER — Telehealth: Payer: Self-pay | Admitting: Pulmonary Disease

## 2018-12-21 MED ORDER — OMEPRAZOLE 20 MG PO CPDR: 20.0000 mg | DELAYED_RELEASE_CAPSULE | Freq: Two times a day (BID) | ORAL | 1 refills | Status: DC

## 2018-12-21 NOTE — Telephone Encounter (Signed)
Spoke with pt and verified preferred pharmacy.  Script sent in.  Nothing further is needed.

## 2018-12-28 ENCOUNTER — Ambulatory Visit (INDEPENDENT_AMBULATORY_CARE_PROVIDER_SITE_OTHER): Payer: BLUE CROSS/BLUE SHIELD | Admitting: Podiatry

## 2018-12-28 ENCOUNTER — Ambulatory Visit (INDEPENDENT_AMBULATORY_CARE_PROVIDER_SITE_OTHER): Payer: BLUE CROSS/BLUE SHIELD

## 2018-12-28 ENCOUNTER — Encounter: Payer: Self-pay | Admitting: Podiatry

## 2018-12-28 ENCOUNTER — Other Ambulatory Visit: Payer: Self-pay

## 2018-12-28 DIAGNOSIS — M2041 Other hammer toe(s) (acquired), right foot: Secondary | ICD-10-CM | POA: Diagnosis not present

## 2018-12-28 MED ORDER — DOXYCYCLINE HYCLATE 100 MG PO TABS: 100.0000 mg | ORAL_TABLET | Freq: Two times a day (BID) | ORAL | 0 refills | Status: DC

## 2018-12-28 MED ORDER — HYDROMORPHONE HCL 4 MG PO TABS: 4.0000 mg | ORAL_TABLET | ORAL | 0 refills | Status: DC | PRN

## 2018-12-28 MED FILL — DOXYCYCLINE HYCLATE 100 MG: 100 | 10 days supply | Qty: 20 | Fill #0

## 2018-12-31 ENCOUNTER — Encounter: Payer: PPO | Admitting: Podiatry

## 2018-12-31 NOTE — Progress Notes (Signed)
Subjective:   Patient ID: Rebecca Marshall, female   DOB: 68 y.o.   MRN: [id number]   HPI Patient states doing well with foot but just wanted to make sure there was no issues with there was some redness that I had seen earlier   ROS      Objective:  Physical Exam  Neurovascular status intact muscle strength is adequate with patient found to have swelling in the digits where the toes were straightened with good healing and no active drainage but mild redness of the digits themselves.  The metatarsal is doing well with good range of motion no pain     Assessment:  Localized irritation of tissue but no indications currently of active infective process     Plan:  H&P condition reviewed and stitches removed wound edges coapted well and at this point pin to remove the second and third toe as they have moved out the distal direction sterile dressings applied and as precautionary measure placed on doxycycline 100 mg twice daily gave strict instructions if any redness drainage or proximal edema erythema should occur patient is to let us know immediately  X-rays did indicate the implant is in place good alignment and the digits are in good alignment currently

## 2019-01-11 ENCOUNTER — Ambulatory Visit (INDEPENDENT_AMBULATORY_CARE_PROVIDER_SITE_OTHER): Payer: PPO | Admitting: Podiatry

## 2019-01-11 ENCOUNTER — Other Ambulatory Visit: Payer: Self-pay

## 2019-01-11 ENCOUNTER — Encounter: Payer: Self-pay | Admitting: Podiatry

## 2019-01-11 VITALS — Temp 98.1°F

## 2019-01-11 DIAGNOSIS — L03115 Cellulitis of right lower limb: Secondary | ICD-10-CM

## 2019-01-11 DIAGNOSIS — M2041 Other hammer toe(s) (acquired), right foot: Secondary | ICD-10-CM

## 2019-01-11 DIAGNOSIS — L03119 Cellulitis of unspecified part of limb: Secondary | ICD-10-CM

## 2019-01-11 DIAGNOSIS — L02611 Cutaneous abscess of right foot: Secondary | ICD-10-CM

## 2019-01-11 DIAGNOSIS — L02619 Cutaneous abscess of unspecified foot: Secondary | ICD-10-CM

## 2019-01-11 NOTE — Progress Notes (Signed)
Subjective:   Patient ID: Rebecca Marshall, female   DOB: 68 y.o.   MRN: [id number]   HPI Patient presents stating that overall she is doing well and she is noted several small spots of redness but they are localized and the antibiotics seem to help her quite a bit at last visit and she is concerned about slight elevation of the digits but is very happy with her big toe joint as far as reduced pain   ROS      Objective:  Physical Exam  Neurovascular status intact negative Homans sign was noted with patient's foot healing well with good motion of the first MPJ mild elevation of the second digit with moderate swelling of the second MPJ but overall is showing good recovery with no current indication of infective process     Assessment:  Patient has a localized cellulitic event that has responded well to antibiotics and at this time no erythema edema drainage was noted with moderate elevation of the second digit right     Plan:  H&P reviewed condition and at this point I have recommended a above ankle brace to both provide compression and also lower the second toe with instructions given today on proper application of brace.  Patient was advised on continued elevation compression and gradual return to shoe gear and will be seen back 6 weeks or earlier if any issues should occur

## 2019-01-19 DIAGNOSIS — C73 Malignant neoplasm of thyroid gland: Secondary | ICD-10-CM | POA: Diagnosis not present

## 2019-01-19 DIAGNOSIS — E890 Postprocedural hypothyroidism: Secondary | ICD-10-CM | POA: Diagnosis not present

## 2019-01-23 ENCOUNTER — Emergency Department (HOSPITAL_COMMUNITY): Payer: BLUE CROSS/BLUE SHIELD

## 2019-01-23 ENCOUNTER — Encounter (HOSPITAL_COMMUNITY): Payer: Self-pay | Admitting: Emergency Medicine

## 2019-01-23 ENCOUNTER — Other Ambulatory Visit: Payer: Self-pay

## 2019-01-23 ENCOUNTER — Emergency Department (HOSPITAL_COMMUNITY)
Admission: EM | Admit: 2019-01-23 | Discharge: 2019-01-23 | Disposition: A | Discharge status: Home / Self Care | Payer: BLUE CROSS/BLUE SHIELD | Attending: Emergency Medicine | Admitting: Emergency Medicine

## 2019-01-23 DIAGNOSIS — Z79899 Other long term (current) drug therapy: Secondary | ICD-10-CM | POA: Diagnosis not present

## 2019-01-23 DIAGNOSIS — W1781XA Fall down embankment (hill), initial encounter: Secondary | ICD-10-CM | POA: Insufficient documentation

## 2019-01-23 DIAGNOSIS — R52 Pain, unspecified: Secondary | ICD-10-CM | POA: Diagnosis not present

## 2019-01-23 DIAGNOSIS — S0081XA Abrasion of other part of head, initial encounter: Secondary | ICD-10-CM | POA: Diagnosis not present

## 2019-01-23 DIAGNOSIS — Y999 Unspecified external cause status: Secondary | ICD-10-CM | POA: Diagnosis not present

## 2019-01-23 DIAGNOSIS — S0990XA Unspecified injury of head, initial encounter: Secondary | ICD-10-CM | POA: Diagnosis not present

## 2019-01-23 DIAGNOSIS — Z96643 Presence of artificial hip joint, bilateral: Secondary | ICD-10-CM | POA: Diagnosis not present

## 2019-01-23 DIAGNOSIS — S0181XA Laceration without foreign body of other part of head, initial encounter: Secondary | ICD-10-CM | POA: Diagnosis not present

## 2019-01-23 DIAGNOSIS — R609 Edema, unspecified: Secondary | ICD-10-CM | POA: Diagnosis not present

## 2019-01-23 DIAGNOSIS — S60512A Abrasion of left hand, initial encounter: Secondary | ICD-10-CM

## 2019-01-23 DIAGNOSIS — S199XXA Unspecified injury of neck, initial encounter: Secondary | ICD-10-CM | POA: Diagnosis not present

## 2019-01-23 DIAGNOSIS — M25532 Pain in left wrist: Secondary | ICD-10-CM | POA: Diagnosis not present

## 2019-01-23 DIAGNOSIS — Y939 Activity, unspecified: Secondary | ICD-10-CM | POA: Diagnosis not present

## 2019-01-23 DIAGNOSIS — S6992XA Unspecified injury of left wrist, hand and finger(s), initial encounter: Secondary | ICD-10-CM | POA: Diagnosis not present

## 2019-01-23 DIAGNOSIS — Z85850 Personal history of malignant neoplasm of thyroid: Secondary | ICD-10-CM | POA: Insufficient documentation

## 2019-01-23 DIAGNOSIS — W19XXXA Unspecified fall, initial encounter: Secondary | ICD-10-CM | POA: Diagnosis not present

## 2019-01-23 DIAGNOSIS — Y9289 Other specified places as the place of occurrence of the external cause: Secondary | ICD-10-CM | POA: Insufficient documentation

## 2019-01-23 DIAGNOSIS — Z23 Encounter for immunization: Secondary | ICD-10-CM | POA: Insufficient documentation

## 2019-01-23 DIAGNOSIS — R0902 Hypoxemia: Secondary | ICD-10-CM | POA: Diagnosis not present

## 2019-01-23 DIAGNOSIS — M79642 Pain in left hand: Secondary | ICD-10-CM | POA: Diagnosis not present

## 2019-01-23 MED ORDER — TETANUS-DIPHTH-ACELL PERTUSSIS 5-2.5-18.5 LF-MCG/0.5 IM SUSP
0.5000 mL | Freq: Once | INTRAMUSCULAR | Status: AC
  Administered 2019-01-23: 0.5 mL via INTRAMUSCULAR
  Filled 2019-01-23: qty 0.5

## 2019-01-23 MED ORDER — CEPHALEXIN 500 MG PO CAPS: 500.0000 mg | ORAL_CAPSULE | Freq: Three times a day (TID) | ORAL | 0 refills | Status: DC

## 2019-01-23 MED ORDER — LIDOCAINE-EPINEPHRINE (PF) 2 %-1:200000 IJ SOLN
20.0000 mL | Freq: Once | INTRAMUSCULAR | Status: AC
  Administered 2019-01-23: 20 mL
  Filled 2019-01-23: qty 20

## 2019-01-23 MED ORDER — ACETAMINOPHEN 325 MG PO TABS
650.0000 mg | ORAL_TABLET | Freq: Once | ORAL | Status: AC
  Administered 2019-01-23: 650 mg via ORAL
  Filled 2019-01-23: qty 2

## 2019-01-23 NOTE — ED Triage Notes (Signed)
Pt BIBA from home c/o fall while gardening.  Pt landed on left side, laceration to left forehead, abrasions to left cheek and hand. Pt denies pain to back of head.    Pt denies taking blood thinners, denies LOC.    SpO2 91% on room air.

## 2019-01-23 NOTE — Discharge Instructions (Addendum)
Suture removal 5 days.

## 2019-01-23 NOTE — ED Notes (Signed)
Bed: WA24 Expected date:  Expected time:  Means of arrival:  Comments: Fall Hematoma/Lac to head, arm injury

## 2019-01-23 NOTE — ED Provider Notes (Signed)
[place] DEPT Provider Note   CSN: [id number] Arrival date & time: 01/23/19  1512    History   Chief Complaint No chief complaint on file.   HPI  Rebecca Marshall is a 68 y.o. female.     HPI 68 year old female presents the emergency department after a fall at her house today.  She fell down a hill landing on her left side resulting in injury to her left hand and her left forehead and her left cheek.  She reports mild neck pain.  She denies weakness of her arms or legs.  Full range of motion of her major joints of her arms and legs although with some pain in her left wrist.  She believes her last tetanus was updated 8 years ago.  She has a laceration to the left lateral forehead.  She is not on anticoagulants.  No significant headache.  Denies nausea and vomiting.   Past Medical History:  Diagnosis Date   Acne nodule 11/26/2011   ADENOCARCINOMA, THYROID GLAND, PAPILLARY 05/17/2009   04/2009 Two surgeries for thyroid cancer Path report from last surgery :THYROID, LEFT, HEMITHYROIDECTOMY: - MULTIPLE FOCI OF FOLLICULAR VARIANT OR PAPILLARY THYROID CARCINOMA, 1.7 CM IN GREATEST DIMENSION, CONFINED WITHIN THE THYROID TISSUE. - ANGIOLYMPHATIC INVASION PRESENT. - ISTHMUS RESECTION MARGIN INVOLVED BY TUMOR. - FOLLICULAR ADENOMA(S) WITH ASSOCIATED LYMPHOCYTIC THYROIDITIS.      AK (actinic keratosis) 04/16/2012   ALLERGIC RHINITIS 01/30/2009   Qualifier: Diagnosis of  By: Andria Frames MD, [place] 09/03/2010   Long standing depression with anxiety. Good response to medications initially. Has been to [facility].     Arthritis    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 08/11/2007   LEFT IRC:VELFY hip replacement (Dr Berenice Primas) with recalled hardware. Had revision by Dr Berenice Primas in 2011 with  ASR .Marland Kitchen     Atrial dysrhythmia    FOLLOWED BY DR Caryl Comes WAS HAVING PALPITATIONS AND SYNCOPAL EPISODES ONSET 9 YEARS AGO ; STARTED ON FLECAIINIDE , PER  PATIENT TOLERATES WELL, LAST SYNCOPAL EPISODE WAS OVER 5 YEARS AGO ; SEE LOV IN  EPIC    Atrial tachycardia (HCC) 06/05/2011   \Treated with flecainide-2011 2010-QRS duration 88 ms 2011 QRS duration 88 ms 2014 QRS duration 98 ms 2014 QRS 120mg    120 ms     Bilateral bunions 12/18/2010   Bilateral knee pain 06/29/2015   Cancer ([place])    thyroid-status post resection on replacement Dr. Chalmers Cater   Clinical depression 03/06/2016   Cognitive changes 09/23/7508   Complication of anesthesia    FENTANYL CAUSED ANAPHYLAXIS DURING CA COLONOSCOPY   Depression    Dysrhythmia    Dr Caryl Comes monitoring patient, atrial tachycardia under control, cardiac clearance 09/10/17   Family history of diabetes mellitus (DM) 05/11/2014   Fibromyalgia    Gait abnormality 12/18/2010   GE reflux    GERD 07/14/2007   Qualifier: Diagnosis of  By: Nori Riis MD, Clarise Cruz     Headache    migraines   Headache upon awakening 06/05/2011   Herniated lumbar disc without myelopathy 12/10/2013   Had surgery by neurosurgery in December, 2014.L4-5 laminotomy and microdiscectomy Dr Margreta Journey Done at [facility]    Hip pain, chronic 04/05/2015   Status post THR plus revision.    Hypoxemia 01/30/2015   Memory change 05/12/2014   Reports problems with short-term memory, focusing, concentration in the last 6-12 months. Problems were significant enough that she  had to quit her part-time job is fairly distressed about this.    Metatarsalgia 12/18/2010   Nausea & vomiting 06/05/2011   Nocturia more than twice per night 01/30/2015   Nonallopathic lesion of cervical region 11/26/2011   Nonallopathic lesion of lumbosacral region 11/26/2011   OSA on CPAP 01/30/2015   Osteoporosis    PONV (postoperative nausea and vomiting)    did not have nausea/vomiting with nissen fundoplication done 84/6962 at [facility]   Poor compliance with CPAP treatment 03/24/2017   Rash and nonspecific skin eruption 01/09/2016    located in the 10  o'clock position on Right breast     Severe obesity (BMI >= 40) ([place]) 01/30/2015   Sleep apnea 10/2015   x 1 year, CPAP   Snorings 01/30/2015   Status post Nissen fundoplication 95/10/8411   Subacute cough 03/27/2012   Thyroid disease    Well adult health check 06/05/2011    Patient Active Problem List   Diagnosis Date Noted   Hiatal hernia with GERD 06/05/2018   Absolute anemia 05/12/2018   Episodic cluster headache, not intractable 05/12/2018   Morbid obesity ([place]) 05/12/2018   Chest pain due to GERD 05/12/2018   Iron deficiency anemia 04/07/2018   Primary osteoarthritis of right hip 09/12/2017   Status post Nissen fundoplication 24/40/1027   Poor compliance with CPAP treatment 03/24/2017   Dysphonia 02/05/2017   Depression headache 03/06/2016   Clinical depression 03/06/2016   Rash and nonspecific skin eruption 01/09/2016   Bilateral knee pain 06/29/2015   Hip pain, chronic 04/05/2015   OSA on CPAP 01/30/2015   Snorings 01/30/2015   Severe obesity (BMI >= 40) ([place]) 01/30/2015   Nocturia more than twice per night 01/30/2015   Hypoxemia 01/30/2015   OSA (obstructive sleep apnea) 11/01/2014   Cognitive changes 10/12/2014   Memory change 05/12/2014   Family history of diabetes mellitus (DM) 05/11/2014   Herniated lumbar disc without myelopathy 12/10/2013   AK (actinic keratosis) 04/16/2012   Atrial tachycardia ([place]) 06/05/2011   Bilateral bunions 12/18/2010   Metatarsalgia 12/18/2010   ANXIETY DEPRESSION 09/03/2010   ADENOCARCINOMA, THYROID GLAND, PAPILLARY 05/17/2009   ALLERGIC RHINITIS 01/30/2009   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 08/11/2007   GERD 07/14/2007   FIBROMYALGIA 07/14/2007    Past Surgical History:  Procedure Laterality Date   ABDOMINAL HYSTERECTOMY     BREAST EXCISIONAL BIOPSY Right 25+ yrs ago   benign   caroitid dopplers Bilateral 11/2015   normal   CATARACT EXTRACTION Bilateral 09/05/15, 09/12/15     CHOLECYSTECTOMY     COLONOSCOPY  04/2011   ESOPHAGEAL MANOMETRY N/A 03/31/2017   Procedure: ESOPHAGEAL MANOMETRY (EM);  Surgeon: Ronald Lobo, MD;  Location: [facility];  Service: Endoscopy;  Laterality: N/A;   HIATAL HERNIA REPAIR N/A 06/05/2018   Procedure: LAPAROSCOPIC  ENTERO-LYSIS  ERAS PATHWAY;  Surgeon: Johnathan Hausen, MD;  Location: [facility];  Service: General;  Laterality: N/A;   LAPAROSCOPIC NISSEN FUNDOPLICATION N/A 25/11/6642   Procedure: LAPAROSCOPIC NISSEN FUNDOPLICATION;  Surgeon: Johnathan Hausen, MD;  Location: [facility];  Service: General;  Laterality: N/A;   THYROIDECTOMY  04/2009   papillary carcimao Dr. Erik Obey   TOE SURGERY     bone spur removed - rt great toe   TOTAL HIP ARTHROPLASTY  2009 and 2011   L hip    TOTAL HIP ARTHROPLASTY Right 09/12/2017   Procedure: TOTAL HIP ARTHROPLASTY ANTERIOR APPROACH;  Surgeon: Dorna Leitz, MD;  Location: [facility];  Service: Orthopedics;  Laterality: Right;   TUBAL LIGATION     UPPER GI ENDOSCOPY  06/05/2018   Procedure: UPPER GI ENDOSCOPY;  Surgeon: Johnathan Hausen, MD;  Location: [facility];  Service: General;;     OB History   No obstetric history on file.      Home Medications    Prior to Admission medications   Medication Sig Start Date End Date Taking? Authorizing Provider  acetaminophen (TYLENOL) 500 MG tablet Take 1,000 mg by mouth every 8 (eight) hours as needed for mild pain or moderate pain.     [provider]  cetirizine (ZYRTEC) 10 MG tablet Take 10 mg by mouth daily as needed for allergies.     [provider]  cyclobenzaprine (FLEXERIL) 10 MG tablet Take 10 mg by mouth 2 (two) times daily.    [provider]  doxycycline (VIBRA-TABS) 100 MG tablet Take 1 tablet (100 mg total) by mouth 2 (two) times daily. 12/28/18   Wallene Huh, DPM  eletriptan (RELPAX) 40 MG tablet Take by mouth one at the beginning of headache and may repeat once in 2  Hours for maximum of 2 tabs in 24hours  04/05/15   Dickie La, MD  flecainide (TAMBOCOR) 150 MG tablet Take 1 tablet (150 mg total) by mouth 2 (two) times daily. 07/21/18   Deboraha Sprang, MD  HYDROmorphone (DILAUDID) 4 MG tablet  12/08/18   [provider]  HYDROmorphone (DILAUDID) 4 MG tablet Take 1 tablet (4 mg total) by mouth every 4 (four) hours as needed for severe pain. 12/28/18   Wallene Huh, DPM  levothyroxine (SYNTHROID, LEVOTHROID) 137 MCG tablet Take 137 mcg by mouth daily before breakfast.    [provider]  Melatonin 10 MG TABS Take 10 mg by mouth at bedtime as needed (for sleep.).    [provider]  omeprazole (PRILOSEC) 20 MG capsule Take 1 capsule (20 mg total) by mouth 2 (two) times daily before a meal for 30 days. 12/21/18 01/20/19  Icard, Octavio Graves, DO  ondansetron (ZOFRAN ODT) 4 MG disintegrating tablet 4mg  ODT q4 hours prn nausea/vomit 06/28/17   Stark Klein, MD  prochlorperazine (COMPAZINE) 10 MG tablet Take 1 tablet (10 mg total) by mouth every 6 (six) hours as needed for nausea or vomiting (Use for nausea and / or vomiting unresolved with ondansetron (Zofran).). 06/28/17   Stark Klein, MD  sertraline (ZOLOFT) 100 MG tablet Take 200 mg by mouth daily.    [provider]  topiramate (TOPAMAX) 25 MG tablet TAKE 2 TABLETS AT BEDTIME 03/23/18   Melvenia Beam, MD  topiramate (TOPAMAX) 50 MG tablet  07/20/18   [provider]  traZODone (DESYREL) 100 MG tablet Take 300 mg by mouth at bedtime.    [provider]  triamcinolone cream (KENALOG) 0.1 % Apply 1 application topically 2 (two) times daily as needed (for ezcema).    [provider]    Family History Family History  Problem Relation Age of Onset   Cancer Other    Alcohol abuse Mother    Cancer Mother 47       Lung    Alcohol abuse Father    Cancer Sister 28       Breast    Social History Social History   Tobacco Use   Smoking status: Never Smoker   Smokeless tobacco: Never  Used  Substance Use Topics   Alcohol use: No    Alcohol/week: 0.0 standard drinks  Comment: none   Drug use: No     Allergies   Nsaids and Codeine   Review of Systems Review of Systems  All other systems reviewed and are negative.    Physical Exam Updated Vital Signs BP 133/89 (BP Location: Right Arm)    Temp 98.2 F (36.8 C) (Oral)    Resp 16    Wt 95.3 kg    SpO2 96%    BMI 33.64 kg/m   Physical Exam Vitals signs and nursing note reviewed.  Constitutional:      General: She is not in acute distress.    Appearance: She is well-developed.  HENT:     Head: Normocephalic.     Comments: Laceration left forehead with associated abrasion.  No active bleeding at this time.  Left cheek abrasion.  No trismus or malocclusion. Neck:     Musculoskeletal: Normal range of motion.     Comments: Mild cervical and paracervical tenderness without cervical step-off. Cardiovascular:     Rate and Rhythm: Normal rate and regular rhythm.     Heart sounds: Normal heart sounds.  Pulmonary:     Effort: Pulmonary effort is normal.     Breath sounds: Normal breath sounds.  Abdominal:     General: There is no distension.     Palpations: Abdomen is soft.     Tenderness: There is no abdominal tenderness.  Musculoskeletal: Normal range of motion.     Comments: Full range of motion bilateral knees, hips, ankles.  Full range of motion of bilateral shoulders and elbows.  Mild painful range of motion of the left shoulder with tenderness of the left fourth and fifth distal metacarpals with associated swelling and bruising.  Multiple abrasions to the left hand without active bleeding.  Several abrasions to the right hand without focal tenderness  Skin:    General: Skin is warm and dry.  Neurological:     Mental Status: She is alert and oriented to person, place, and time.  Psychiatric:        Judgment: Judgment normal.      ED Treatments / Results  Labs (all labs ordered are listed, but  only abnormal results are displayed) Labs Reviewed - No data to display  EKG None  Radiology Dg Wrist Complete Left  Result Date: 01/23/2019 CLINICAL DATA:  Left wrist pain EXAM: LEFT WRIST - COMPLETE 3+ VIEW COMPARISON:  None. FINDINGS: No acute fracture or dislocation. Severe osteoarthritis of the first [facility] joint. Mild osteoarthritis of the first IP joint. Radiopaque particulate material which appears to be on the skin surface between the webspace of the fourth and fifth fingers. No aggressive osseous lesion. IMPRESSION: 1.  No acute osseous injury of the left wrist. Electronically Signed   By: Kathreen Devoid   On: 01/23/2019 16:01   Dg Hand Complete Left  Result Date: 01/23/2019 CLINICAL DATA:  Left hand pain, status post fall EXAM: LEFT HAND - COMPLETE 3+ VIEW COMPARISON:  None. FINDINGS: No acute fracture or dislocation. Severe osteoarthritis of the first [facility] joint. Mild osteoarthritis of the first IP joint. Mild osteoarthritis of the fifth PIP and DIP joints. Mild osteoarthritis of the left second and third DIP joints. Radiopaque particulate material which appears to be on the skin surface between the webspace of the fourth and fifth fingers. No aggressive osseous lesion. IMPRESSION: 1.  No acute osseous injury of the left hand. Electronically Signed   By: Kathreen Devoid   On: 01/23/2019 16:00    Procedures .Marland Kitchen  Laceration Repair Performed by: Jola Schmidt, MD Authorized by: Jola Schmidt, MD    LACERATION REPAIR Performed by: Jola Schmidt Consent: Verbal consent obtained. Risks and benefits: risks, benefits and alternatives were discussed Patient identity confirmed: provided demographic data Time out performed prior to procedure Prepped and Draped in normal sterile fashion Wound explored Laceration Location: left forehead Laceration Length: 1.75cm No Foreign Bodies seen or palpated Anesthesia: local infiltration Local anesthetic: lidocaine 2% with epinephrine Anesthetic total: 5  ml Irrigation method: syringe Amount of cleaning: standard Skin closure: 5-0 prolene Number of sutures or staples: 5 Technique: simple interrupted Patient tolerance: Patient tolerated the procedure well with no immediate complications.   Medications Ordered in ED Medications  Tdap (BOOSTRIX) injection 0.5 mL (0.5 mLs Intramuscular Given 01/23/19 1541)  acetaminophen (TYLENOL) tablet 650 mg (650 mg Oral Given 01/23/19 1540)  lidocaine-EPINEPHrine (XYLOCAINE W/EPI) 2 %-1:200000 (PF) injection 20 mL (20 mLs Infiltration Given 01/23/19 1722)     Initial Impression / Assessment and Plan / ED Course  I have reviewed the triage vital signs and the nursing notes.  Pertinent labs & imaging results that were available during my care of the patient were reviewed by me and considered in my medical decision making (see chart for details).        CT imaging and plain films without acute traumatic abnormality.  Laceration repaired.  Close head injury warnings given.  Infection warnings given.  Full range of motion of other major joints.  Plan for discharge home at this time.  Patient understands return to the emergency department for new or worsening symptoms   Final Clinical Impressions(s) / ED Diagnoses   Final diagnoses:  Closed head injury, initial encounter  Forehead laceration, initial encounter  Abrasion of left hand, initial encounter    ED Discharge Orders         Ordered    cephALEXin (KEFLEX) 500 MG capsule  3 times daily     01/23/19 1713           Jola Schmidt, MD 01/28/19 1947

## 2019-01-26 DIAGNOSIS — M810 Age-related osteoporosis without current pathological fracture: Secondary | ICD-10-CM | POA: Diagnosis not present

## 2019-01-26 DIAGNOSIS — E890 Postprocedural hypothyroidism: Secondary | ICD-10-CM | POA: Diagnosis not present

## 2019-01-26 DIAGNOSIS — C73 Malignant neoplasm of thyroid gland: Secondary | ICD-10-CM | POA: Diagnosis not present

## 2019-01-26 DIAGNOSIS — M899 Disorder of bone, unspecified: Secondary | ICD-10-CM | POA: Diagnosis not present

## 2019-01-26 MED FILL — HYDROmorphone HCL 4 MG TABS: 4 | 4 days supply | Qty: 20 | Fill #0

## 2019-01-26 MED FILL — LEVOTHYROXINE 150 MCG TAB: 150 | 30 days supply | Qty: 30 | Fill #0

## 2019-01-29 DIAGNOSIS — K449 Diaphragmatic hernia without obstruction or gangrene: Secondary | ICD-10-CM | POA: Diagnosis not present

## 2019-02-02 DIAGNOSIS — W19XXXD Unspecified fall, subsequent encounter: Secondary | ICD-10-CM | POA: Diagnosis not present

## 2019-02-02 DIAGNOSIS — S0101XD Laceration without foreign body of scalp, subsequent encounter: Secondary | ICD-10-CM | POA: Diagnosis not present

## 2019-02-22 ENCOUNTER — Other Ambulatory Visit: Payer: Self-pay

## 2019-02-22 ENCOUNTER — Other Ambulatory Visit: Payer: Self-pay | Admitting: Podiatry

## 2019-02-22 ENCOUNTER — Ambulatory Visit (INDEPENDENT_AMBULATORY_CARE_PROVIDER_SITE_OTHER): Payer: PPO | Admitting: Podiatry

## 2019-02-22 ENCOUNTER — Ambulatory Visit (INDEPENDENT_AMBULATORY_CARE_PROVIDER_SITE_OTHER): Payer: PPO

## 2019-02-22 ENCOUNTER — Encounter: Payer: Self-pay | Admitting: Podiatry

## 2019-02-22 VITALS — Temp 96.6°F

## 2019-02-22 DIAGNOSIS — M2041 Other hammer toe(s) (acquired), right foot: Secondary | ICD-10-CM

## 2019-02-22 DIAGNOSIS — M205X1 Other deformities of toe(s) (acquired), right foot: Secondary | ICD-10-CM

## 2019-02-22 NOTE — Progress Notes (Signed)
Subjective:   Patient ID: Rebecca Marshall, female   DOB: 68 y.o.   MRN: [id number]   HPI Patient presents stating she dropped something on her right big toe joint 1 to make sure was okay but overall is been doing well and is very happy with the clinical appearance of her foot   ROS      Objective:  Physical Exam  Neurovascular status intact with patient's right foot doing well with minimal discomfort noted with good range of motion in digits and good alignment with mild swelling secondary to the injury     Assessment:  Overall doing well with mild trauma to the right foot     Plan:  H&P conditions reviewed x-ray taken at this point gradual return to normal activities.  Patient is encouraged to continue range of motion and will call me if any issues were to occur but at this point is very stable  X-rays indicate that the implant is in place and the toes are in good alignment especially in comparison to preoperative views

## 2019-02-23 DIAGNOSIS — Z01812 Encounter for preprocedural laboratory examination: Secondary | ICD-10-CM | POA: Diagnosis not present

## 2019-02-23 DIAGNOSIS — Z01810 Encounter for preprocedural cardiovascular examination: Secondary | ICD-10-CM | POA: Diagnosis not present

## 2019-02-23 DIAGNOSIS — Z1159 Encounter for screening for other viral diseases: Secondary | ICD-10-CM | POA: Diagnosis not present

## 2019-02-23 DIAGNOSIS — K449 Diaphragmatic hernia without obstruction or gangrene: Secondary | ICD-10-CM | POA: Diagnosis not present

## 2019-02-23 DIAGNOSIS — Z01818 Encounter for other preprocedural examination: Secondary | ICD-10-CM | POA: Diagnosis not present

## 2019-02-25 DIAGNOSIS — E890 Postprocedural hypothyroidism: Secondary | ICD-10-CM | POA: Diagnosis not present

## 2019-02-26 DIAGNOSIS — K224 Dyskinesia of esophagus: Secondary | ICD-10-CM | POA: Diagnosis not present

## 2019-03-02 DIAGNOSIS — R1310 Dysphagia, unspecified: Secondary | ICD-10-CM | POA: Diagnosis not present

## 2019-03-02 DIAGNOSIS — R1013 Epigastric pain: Secondary | ICD-10-CM | POA: Diagnosis not present

## 2019-03-02 DIAGNOSIS — R12 Heartburn: Secondary | ICD-10-CM | POA: Diagnosis not present

## 2019-03-02 DIAGNOSIS — K219 Gastro-esophageal reflux disease without esophagitis: Secondary | ICD-10-CM | POA: Diagnosis not present

## 2019-03-04 ENCOUNTER — Ambulatory Visit: Payer: BLUE CROSS/BLUE SHIELD | Admitting: Pulmonary Disease

## 2019-03-11 DIAGNOSIS — Z01818 Encounter for other preprocedural examination: Secondary | ICD-10-CM | POA: Diagnosis not present

## 2019-03-23 ENCOUNTER — Telehealth: Payer: Self-pay | Admitting: *Deleted

## 2019-03-23 NOTE — Telephone Encounter (Signed)

## 2019-03-24 ENCOUNTER — Ambulatory Visit (INDEPENDENT_AMBULATORY_CARE_PROVIDER_SITE_OTHER)
Admission: RE | Admit: 2019-03-24 | Discharge: 2019-03-24 | Disposition: A | Discharge status: Home / Self Care | Payer: BC Managed Care – PPO | Source: Ambulatory Visit | Attending: Pulmonary Disease | Admitting: Pulmonary Disease

## 2019-03-24 ENCOUNTER — Other Ambulatory Visit: Payer: Self-pay

## 2019-03-24 DIAGNOSIS — R911 Solitary pulmonary nodule: Secondary | ICD-10-CM

## 2019-03-24 DIAGNOSIS — R918 Other nonspecific abnormal finding of lung field: Secondary | ICD-10-CM

## 2019-03-24 DIAGNOSIS — Z1159 Encounter for screening for other viral diseases: Secondary | ICD-10-CM | POA: Diagnosis not present

## 2019-03-25 DIAGNOSIS — Z1159 Encounter for screening for other viral diseases: Secondary | ICD-10-CM | POA: Diagnosis not present

## 2019-03-25 DIAGNOSIS — Z01812 Encounter for preprocedural laboratory examination: Secondary | ICD-10-CM | POA: Diagnosis not present

## 2019-03-29 ENCOUNTER — Ambulatory Visit: Payer: BLUE CROSS/BLUE SHIELD | Admitting: Pulmonary Disease

## 2019-03-30 DIAGNOSIS — R1310 Dysphagia, unspecified: Secondary | ICD-10-CM | POA: Diagnosis not present

## 2019-03-30 DIAGNOSIS — K219 Gastro-esophageal reflux disease without esophagitis: Secondary | ICD-10-CM | POA: Diagnosis not present

## 2019-03-31 DIAGNOSIS — K449 Diaphragmatic hernia without obstruction or gangrene: Secondary | ICD-10-CM | POA: Diagnosis not present

## 2019-03-31 DIAGNOSIS — Z01818 Encounter for other preprocedural examination: Secondary | ICD-10-CM | POA: Diagnosis not present

## 2019-04-01 ENCOUNTER — Encounter: Payer: Self-pay | Admitting: Pulmonary Disease

## 2019-04-01 ENCOUNTER — Ambulatory Visit (INDEPENDENT_AMBULATORY_CARE_PROVIDER_SITE_OTHER): Payer: BC Managed Care – PPO | Admitting: Pulmonary Disease

## 2019-04-01 ENCOUNTER — Other Ambulatory Visit: Payer: Self-pay

## 2019-04-01 VITALS — BP 110/78 | HR 80 | Ht 66.5 in | Wt 223.6 lb

## 2019-04-01 DIAGNOSIS — R05 Cough: Secondary | ICD-10-CM | POA: Diagnosis not present

## 2019-04-01 DIAGNOSIS — K449 Diaphragmatic hernia without obstruction or gangrene: Secondary | ICD-10-CM | POA: Diagnosis not present

## 2019-04-01 DIAGNOSIS — R911 Solitary pulmonary nodule: Secondary | ICD-10-CM | POA: Diagnosis not present

## 2019-04-01 DIAGNOSIS — R059 Cough, unspecified: Secondary | ICD-10-CM

## 2019-04-01 DIAGNOSIS — Z9889 Other specified postprocedural states: Secondary | ICD-10-CM

## 2019-04-01 NOTE — Patient Instructions (Signed)
Thank you for visiting Dr. Valeta Harms at [facility]. Today we recommend the following: Please call with any questions or concerns.  Good luck with your planned surgery! Stay safe and remember the 3Ws.   Return if symptoms worsen or fail to improve.

## 2019-04-01 NOTE — Progress Notes (Signed)
Synopsis: Referred in Feb 2020 for chronic cough by Kathyrn Lass, MD  Subjective:   PATIENT ID: Rebecca Marshall GENDER: female DOB: 05-14-1951, MRN: [id number]  Chief Complaint  Patient presents with  . Follow-up    PFT and ABG yesterday. Here to discuss Chest CT.     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 Randomly started coughing about 4 months ago. She was treated with abx for a week. She took ppi, omeprazole for about a week. She has a persistent hiatal hernia and had a nissen fundoplication that hasn't helped. Life long non-smoker, tried it in [employer]. Retired Marine scientist, worked for [employer], home care, dialysis. No birds, pets dogs or cats in the house. Second hand smoke exposure from parents. Husband with former working on railroad, dust exposure with items and clothing brought home, however this was many years ago.  Reviewed her CT scan today in the office which reveals enlarged hiatal hernia still present.  There is significant amount of food particles present within the stomach.  She states that she had a CT scan done in the morning.  And did not eat that much for breakfast.  Leaving the concern whether or not she has significant amount of retained food within the stomach.  She does state that her cough is much worse at night.  She lays flat at night.  She has not been using elevation of the head of the bed.  She has been told to do this in the past.  She was on PPI at one point in time twice a day.  She had a attempted Nissen fundoplication back approximately a year and a half ago.  At this point her symptoms are approximately the same as they were before.  But now she has persistent nocturnal cough.  She does use a opiate cough suppressant at nighttime which helps.  OV 04/01/2019: Patient seen today for follow-up regarding recent CAT scan imaging.   This was completed for follow-up of a left upper lobe pulmonary nodule that was 2.5 cm in size groundglass.  Repeat imaging has revealed near complete resolution of this upper lobe lung nodule.  No additional follow-up needed at this time.  Today, patient was anxious about receiving her CAT scan results.  After knowing that that has disappeared she is very happy about this.  She recently had pulmonary function tests that was completed at [facility] for a planned thoracic surgery dealing with her hiatal hernia.  This is going to be done at the end of the month.  Her PFTs showed a reduced ERV and an elevated DLCO with normal spirometry.  This likely related to her weight.   Past Medical History:  Diagnosis Date  . Acne nodule 11/26/2011  . ADENOCARCINOMA, THYROID GLAND, PAPILLARY 05/17/2009   04/2009 Two surgeries for thyroid cancer Path report from last surgery :THYROID, LEFT, HEMITHYROIDECTOMY: - MULTIPLE FOCI OF FOLLICULAR VARIANT OR PAPILLARY THYROID CARCINOMA, 1.7 CM IN GREATEST DIMENSION, CONFINED WITHIN THE THYROID TISSUE. - ANGIOLYMPHATIC INVASION PRESENT. - ISTHMUS RESECTION MARGIN INVOLVED BY TUMOR. - FOLLICULAR ADENOMA(S) WITH ASSOCIATED LYMPHOCYTIC THYROIDITIS.     . AK (actinic keratosis) 04/16/2012  . ALLERGIC RHINITIS 01/30/2009   Qualifier: Diagnosis of  By: Andria Frames MD, Gwyndolyn Saxon    . Anxiety   . ANXIETY DEPRESSION 09/03/2010   Long standing depression with anxiety. Good response to medications initially. Has been to [facility].    Marland Kitchen  Arthritis   . 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 08/11/2007   LEFT TIW:PYKDX hip replacement (Dr Berenice Primas) with recalled hardware. Had revision by Dr Berenice Primas in 2011 with  ASR .Marland Kitchen    Marland Kitchen Atrial dysrhythmia    FOLLOWED BY DR Caryl Comes WAS HAVING PALPITATIONS AND SYNCOPAL EPISODES ONSET 9 YEARS AGO ; STARTED ON FLECAIINIDE , PER PATIENT TOLERATES WELL, LAST SYNCOPAL EPISODE WAS OVER 5 YEARS AGO ; SEE LOV IN  EPIC   . Atrial tachycardia ([place]) 06/05/2011   \Treated with  flecainide-2011 2010-QRS duration 88 ms 2011 QRS duration 88 ms 2014 QRS duration 98 ms 2014 QRS 120mg    120 ms    . Bilateral bunions 12/18/2010  . Bilateral knee pain 06/29/2015  . Cancer [facility])    thyroid-status post resection on replacement Dr. Chalmers Cater  . Clinical depression 03/06/2016  . Cognitive changes 10/12/2014  . Complication of anesthesia    FENTANYL CAUSED ANAPHYLAXIS DURING CA COLONOSCOPY  . Depression   . Dysrhythmia    Dr Caryl Comes monitoring patient, atrial tachycardia under control, cardiac clearance 09/10/17  . Family history of diabetes mellitus (DM) 05/11/2014  . Fibromyalgia   . Gait abnormality 12/18/2010  . GE reflux   . GERD 07/14/2007   Qualifier: Diagnosis of  By: Nori Riis MD, Clarise Cruz    . Headache    migraines  . Headache upon awakening 06/05/2011  . Herniated lumbar disc without myelopathy 12/10/2013   Had surgery by neurosurgery in December, 2014.L4-5 laminotomy and microdiscectomy Dr Margreta Journey Done at [facility]   . Hip pain, chronic 04/05/2015   Status post THR plus revision.   . Hypoxemia 01/30/2015  . Memory change 05/12/2014   Reports problems with short-term memory, focusing, concentration in the last 6-12 months. Problems were significant enough that she had to quit her part-time job is fairly distressed about this.   . Metatarsalgia 12/18/2010  . Nausea & vomiting 06/05/2011  . Nocturia more than twice per night 01/30/2015  . Nonallopathic lesion of cervical region 11/26/2011  . Nonallopathic lesion of lumbosacral region 11/26/2011  . OSA on CPAP 01/30/2015  . Osteoporosis   . PONV (postoperative nausea and vomiting)    did not have nausea/vomiting with nissen fundoplication done 83/3825 at [facility]  . Poor compliance with CPAP treatment 03/24/2017  . Rash and nonspecific skin eruption 01/09/2016    located in the 10 o'clock position on Right breast    . Severe obesity (BMI >= 40) ([place]) 01/30/2015  . Sleep apnea 10/2015   x 1 year, CPAP  . Snorings 01/30/2015   . Status post Nissen fundoplication 01/23/9766  . Subacute cough 03/27/2012  . Thyroid disease   . Well adult health check 06/05/2011     Family History  Problem Relation Age of Onset  . Cancer Other   . Alcohol abuse Mother   . Cancer Mother 41       Lung   . Alcohol abuse Father   . Cancer Sister 77       Breast     Past Surgical History:  Procedure Laterality Date  . ABDOMINAL HYSTERECTOMY    . BREAST EXCISIONAL BIOPSY Right 25+ yrs ago   benign  . caroitid dopplers Bilateral 11/2015   normal  . CATARACT EXTRACTION Bilateral 09/05/15, 09/12/15  . CHOLECYSTECTOMY    . COLONOSCOPY  04/2011  . ESOPHAGEAL MANOMETRY N/A 03/31/2017   Procedure: ESOPHAGEAL MANOMETRY (EM);  Surgeon: Ronald Lobo, MD;  Location: [facility];  Service: Endoscopy;  Laterality: N/A;  . HIATAL HERNIA REPAIR N/A 06/05/2018   Procedure: LAPAROSCOPIC  ENTERO-LYSIS  ERAS PATHWAY;  Surgeon: Johnathan Hausen, MD;  Location: [facility];  Service: General;  Laterality: N/A;  . LAPAROSCOPIC NISSEN FUNDOPLICATION N/A 06/23/7509   Procedure: LAPAROSCOPIC NISSEN FUNDOPLICATION;  Surgeon: Johnathan Hausen, MD;  Location: [facility];  Service: General;  Laterality: N/A;  . THYROIDECTOMY  04/2009   papillary [place]o Dr. Erik Obey  . TOE SURGERY     bone spur removed - rt great toe  . TOTAL HIP ARTHROPLASTY  2009 and 2011   L hip   . TOTAL HIP ARTHROPLASTY Right 09/12/2017   Procedure: TOTAL HIP ARTHROPLASTY ANTERIOR APPROACH;  Surgeon: Dorna Leitz, MD;  Location: [employer];  Service: Orthopedics;  Laterality: Right;  . TUBAL LIGATION    . UPPER GI ENDOSCOPY  06/05/2018   Procedure: UPPER GI ENDOSCOPY;  Surgeon: Johnathan Hausen, MD;  Location: [facility];  Service: General;;    Social History   Socioeconomic History  . Marital status: Married    Spouse name: [name]  . Number of children: 3  . Years of education: 12+  . Highest education level: Not on file  Occupational History  . Occupation: unemployed   Social Needs  .  Financial resource strain: Not on file  . Food insecurity    Worry: Not on file    Inability: Not on file  . Transportation needs    Medical: Not on file    Non-medical: Not on file  Tobacco Use  . Smoking status: Never Smoker  . Smokeless tobacco: Never Used  Substance and Sexual Activity  . Alcohol use: No    Alcohol/week: 0.0 standard drinks    Comment: none  . Drug use: No  . Sexual activity: Not on file  Lifestyle  . Physical activity    Days per week: Not on file    Minutes per session: Not on file  . Stress: Not on file  Relationships  . Social Herbalist on phone: Not on file    Gets together: Not on file    Attends religious service: Not on file    Active member of club or organization: Not on file    Attends meetings of clubs or organizations: Not on file    Relationship status: Not on file  . Intimate partner violence    Fear of current or ex partner: Not on file    Emotionally abused: Not on file    Physically abused: Not on file    Forced sexual activity: Not on file  Other Topics Concern  . Not on file  Social History Narrative   Patient lives at home alone    Patient is married    Patient is not working    Patient has 3 children    Patient has her BSN RN   Patient is right handed       Daily caffeine use 1 cup daily.     Allergies  Allergen Reactions  . Nsaids Other (See Comments)    Rectal bleeding  . Codeine Nausea And Vomiting     Outpatient Medications Prior to Visit  Medication Sig Dispense Refill  . acetaminophen (TYLENOL) 500 MG tablet Take 1,000 mg by mouth every 8 (eight) hours as needed for mild pain or moderate pain.     . cyclobenzaprine (FLEXERIL) 10 MG tablet Take 10 mg by mouth 2 (two) times daily.    Marland Kitchen eletriptan (RELPAX)  40 MG tablet Take by mouth one at the beginning of headache and may repeat once in 2  Hours for maximum of 2 tabs in 24hours 27 tablet 3  . flecainide (TAMBOCOR) 150 MG tablet Take 1 tablet (150 mg  total) by mouth 2 (two) times daily. 180 tablet 3  . levothyroxine (SYNTHROID) 150 MCG tablet Take 150 mcg by mouth daily before breakfast.    . Melatonin 10 MG TABS Take 10 mg by mouth at bedtime as needed (for sleep.).    Marland Kitchen ondansetron (ZOFRAN ODT) 4 MG disintegrating tablet 4mg  ODT q4 hours prn nausea/vomit 30 tablet 1  . sertraline (ZOLOFT) 100 MG tablet Take 200 mg by mouth daily.    Marland Kitchen topiramate (TOPAMAX) 50 MG tablet     . traZODone (DESYREL) 100 MG tablet Take 300 mg by mouth at bedtime.    . cetirizine (ZYRTEC) 10 MG tablet Take 10 mg by mouth daily as needed for allergies.     . cephALEXin (KEFLEX) 500 MG capsule Take 1 capsule (500 mg total) by mouth 3 (three) times daily. (Patient not taking: Reported on 04/01/2019) 21 capsule 0  . doxycycline (VIBRA-TABS) 100 MG tablet Take 1 tablet (100 mg total) by mouth 2 (two) times daily. (Patient not taking: Reported on 04/01/2019) 20 tablet 0  . HYDROmorphone (DILAUDID) 4 MG tablet     . HYDROmorphone (DILAUDID) 4 MG tablet Take 1 tablet (4 mg total) by mouth every 4 (four) hours as needed for severe pain. (Patient not taking: Reported on 04/01/2019) 20 tablet 0  . levothyroxine (SYNTHROID, LEVOTHROID) 137 MCG tablet Take 137 mcg by mouth daily before breakfast.    . omeprazole (PRILOSEC) 20 MG capsule Take 1 capsule (20 mg total) by mouth 2 (two) times daily before a meal for 30 days. 180 capsule 1  . prochlorperazine (COMPAZINE) 10 MG tablet Take 1 tablet (10 mg total) by mouth every 6 (six) hours as needed for nausea or vomiting (Use for nausea and / or vomiting unresolved with ondansetron (Zofran).). (Patient not taking: Reported on 04/01/2019) 20 tablet 1  . topiramate (TOPAMAX) 25 MG tablet TAKE 2 TABLETS AT BEDTIME (Patient not taking: Reported on 04/01/2019) 180 tablet 0  . triamcinolone cream (KENALOG) 0.1 % Apply 1 application topically 2 (two) times daily as needed (for ezcema).     No facility-administered medications prior to visit.      Review of Systems  Constitutional: Negative for chills, fever, malaise/fatigue and weight loss.  HENT: Negative for hearing loss, sore throat and tinnitus.   Eyes: Negative for blurred vision and double vision.  Respiratory: Negative for cough, hemoptysis, sputum production, shortness of breath, wheezing and stridor.   Cardiovascular: Negative for chest pain, palpitations, orthopnea, leg swelling and PND.  Gastrointestinal: Positive for heartburn and nausea. Negative for abdominal pain, constipation, diarrhea and vomiting.  Genitourinary: Negative for dysuria, hematuria and urgency.  Musculoskeletal: Negative for joint pain and myalgias.  Skin: Negative for itching and rash.  Neurological: Negative for dizziness, tingling, weakness and headaches.  Endo/Heme/Allergies: Negative for environmental allergies. Does not bruise/bleed easily.  Psychiatric/Behavioral: Negative for depression. The patient is not nervous/anxious and does not have insomnia.   All other systems reviewed and are negative.    Objective:  Physical Exam Vitals signs reviewed.  Constitutional:      General: She is not in acute distress.    Appearance: She is well-developed.  HENT:     Head: Normocephalic and atraumatic.  Eyes:  General: No scleral icterus.    Conjunctiva/sclera: Conjunctivae normal.     Pupils: Pupils are equal, round, and reactive to light.  Neck:     Musculoskeletal: Neck supple.     Vascular: No JVD.     Trachea: No tracheal deviation.  Cardiovascular:     Rate and Rhythm: Normal rate and regular rhythm.     Heart sounds: Normal heart sounds. No murmur.  Pulmonary:     Effort: Pulmonary effort is normal. No tachypnea, accessory muscle usage or respiratory distress.     Breath sounds: Normal breath sounds. No stridor. No wheezing, rhonchi or rales.  Abdominal:     General: Bowel sounds are normal. There is no distension.     Palpations: Abdomen is soft.     Tenderness: There is no  abdominal tenderness.  Musculoskeletal:        General: No tenderness.  Lymphadenopathy:     Cervical: No cervical adenopathy.  Skin:    General: Skin is warm and dry.     Capillary Refill: Capillary refill takes less than 2 seconds.     Findings: No rash.  Neurological:     Mental Status: She is alert and oriented to person, place, and time.  Psychiatric:        Behavior: Behavior normal.      Vitals:   04/01/19 1602  BP: 110/78  Pulse: 80  SpO2: 97%  Weight: 223 lb 9.6 oz (101.4 kg)  Height: 5' 6.5" (1.689 m)   97% on RA BMI Readings from Last 3 Encounters:  04/01/19 35.55 kg/m  01/23/19 33.64 kg/m  11/03/18 35.82 kg/m   Wt Readings from Last 3 Encounters:  04/01/19 223 lb 9.6 oz (101.4 kg)  01/23/19 210 lb (95.3 kg)  11/03/18 223 lb 9.6 oz (101.4 kg)     CBC    Component Value Date/Time   WBC 4.4 10/06/2018 0756   WBC 11.3 (H) 06/05/2018 1324   RBC 4.11 10/06/2018 0756   HGB 12.4 10/06/2018 0756   HCT 39.4 10/06/2018 0756   PLT 255 10/06/2018 0756   MCV 95.9 10/06/2018 0756   MCH 30.2 10/06/2018 0756   MCHC 31.5 10/06/2018 0756   RDW 13.9 10/06/2018 0756   LYMPHSABS 1.5 10/06/2018 0756   MONOABS 0.5 10/06/2018 0756   EOSABS 0.1 10/06/2018 0756   BASOSABS 0.1 10/06/2018 0756    Chest Imaging: 10/22/2018 CT chest without contrast: Found to have a 2.5 cm groundglass opacity within the peripheral left upper lobe.  Recommending 60-month follow-up. The patient's images have been independently reviewed by me.    03/24/2019 CT chest: Resolution of the left upper lobe 2.5 cm groundglass opacity. The patient's images have been independently reviewed by me.    Pulmonary Functions Testing Results: No flowsheet data found.  FeNO: None   Pathology: None   Echocardiogram:  12/06/2015 ejection fraction 60 to 32%, grade 2 diastolic dysfunction  Heart Catheterization: None     Assessment & Plan:     ICD-10-CM   1. Left upper lobe pulmonary nodule  R91.1    2. Hiatal hernia  K44.9   3. Cough  R05   4. History of Nissen fundoplication  D92.426     Discussion:  68 year old with an incidentally found left upper lobe groundglass nodule.  Repeat CT scanning reviewed today in the office with the patient reveals come near complete resolution of this.  She had PFTs completed recently at [facility] in preparation for a thoracic surgery related  to her hiatal hernia.  She is planning for this at the end of July. At this point she is continuing her acid reduction to help with her cough. Her cough has since resolved. Patient to follow-up with [facility] as scheduled.  No additional follow-up needed regarding her CT image findings.  Patient to return to our clinic as needed for shortness of breath wheezing or any change in respiratory complaints.  Greater than 50% of this patient 25-minute office visit was spent face-to-face discussing above recommendations treatment plan as well as reviewing the patient's images with her in the office today.  She also brought a copy of the pulmonary function test from [facility] and we reviewed these in person as well.    Current Outpatient Medications:  .  acetaminophen (TYLENOL) 500 MG tablet, Take 1,000 mg by mouth every 8 (eight) hours as needed for mild pain or moderate pain. , Disp: , Rfl:  .  cyclobenzaprine (FLEXERIL) 10 MG tablet, Take 10 mg by mouth 2 (two) times daily., Disp: , Rfl:  .  eletriptan (RELPAX) 40 MG tablet, Take by mouth one at the beginning of headache and may repeat once in 2  Hours for maximum of 2 tabs in 24hours, Disp: 27 tablet, Rfl: 3 .  flecainide (TAMBOCOR) 150 MG tablet, Take 1 tablet (150 mg total) by mouth 2 (two) times daily., Disp: 180 tablet, Rfl: 3 .  levothyroxine (SYNTHROID) 150 MCG tablet, Take 150 mcg by mouth daily before breakfast., Disp: , Rfl:  .  Melatonin 10 MG TABS, Take 10 mg by mouth at bedtime as needed (for sleep.)., Disp: , Rfl:  .  ondansetron (ZOFRAN ODT) 4  MG disintegrating tablet, 4mg  ODT q4 hours prn nausea/vomit, Disp: 30 tablet, Rfl: 1 .  sertraline (ZOLOFT) 100 MG tablet, Take 200 mg by mouth daily., Disp: , Rfl:  .  topiramate (TOPAMAX) 50 MG tablet, , Disp: , Rfl:  .  traZODone (DESYREL) 100 MG tablet, Take 300 mg by mouth at bedtime., Disp: , Rfl:    Garner Nash, DO [facility] 04/01/2019 5:00 PM

## 2019-04-07 DIAGNOSIS — R197 Diarrhea, unspecified: Secondary | ICD-10-CM | POA: Diagnosis not present

## 2019-04-07 DIAGNOSIS — R1013 Epigastric pain: Secondary | ICD-10-CM | POA: Diagnosis not present

## 2019-04-07 DIAGNOSIS — K449 Diaphragmatic hernia without obstruction or gangrene: Secondary | ICD-10-CM | POA: Diagnosis not present

## 2019-04-07 MED FILL — CHOLESTYRAMINE PACKET: 4 | 30 days supply | Qty: 60 | Fill #0

## 2019-04-08 DIAGNOSIS — E6609 Other obesity due to excess calories: Secondary | ICD-10-CM | POA: Diagnosis not present

## 2019-04-08 DIAGNOSIS — E890 Postprocedural hypothyroidism: Secondary | ICD-10-CM | POA: Diagnosis not present

## 2019-04-08 DIAGNOSIS — K449 Diaphragmatic hernia without obstruction or gangrene: Secondary | ICD-10-CM | POA: Diagnosis not present

## 2019-04-08 DIAGNOSIS — Z01812 Encounter for preprocedural laboratory examination: Secondary | ICD-10-CM | POA: Diagnosis not present

## 2019-04-08 DIAGNOSIS — R197 Diarrhea, unspecified: Secondary | ICD-10-CM | POA: Diagnosis not present

## 2019-04-08 DIAGNOSIS — Z1159 Encounter for screening for other viral diseases: Secondary | ICD-10-CM | POA: Diagnosis not present

## 2019-04-08 DIAGNOSIS — Z79899 Other long term (current) drug therapy: Secondary | ICD-10-CM | POA: Diagnosis not present

## 2019-04-08 DIAGNOSIS — D509 Iron deficiency anemia, unspecified: Secondary | ICD-10-CM | POA: Diagnosis not present

## 2019-04-12 DIAGNOSIS — R1310 Dysphagia, unspecified: Secondary | ICD-10-CM | POA: Diagnosis not present

## 2019-04-12 DIAGNOSIS — K219 Gastro-esophageal reflux disease without esophagitis: Secondary | ICD-10-CM | POA: Diagnosis not present

## 2019-04-12 DIAGNOSIS — K449 Diaphragmatic hernia without obstruction or gangrene: Secondary | ICD-10-CM | POA: Diagnosis not present

## 2019-04-12 DIAGNOSIS — R12 Heartburn: Secondary | ICD-10-CM | POA: Diagnosis not present

## 2019-04-12 DIAGNOSIS — R1110 Vomiting, unspecified: Secondary | ICD-10-CM | POA: Diagnosis not present

## 2019-04-12 DIAGNOSIS — Z85850 Personal history of malignant neoplasm of thyroid: Secondary | ICD-10-CM | POA: Diagnosis not present

## 2019-04-12 DIAGNOSIS — K224 Dyskinesia of esophagus: Secondary | ICD-10-CM | POA: Diagnosis not present

## 2019-04-12 DIAGNOSIS — Z9889 Other specified postprocedural states: Secondary | ICD-10-CM | POA: Diagnosis not present

## 2019-04-14 DIAGNOSIS — R1310 Dysphagia, unspecified: Secondary | ICD-10-CM | POA: Diagnosis not present

## 2019-04-14 DIAGNOSIS — R12 Heartburn: Secondary | ICD-10-CM | POA: Diagnosis not present

## 2019-04-16 DIAGNOSIS — Z01818 Encounter for other preprocedural examination: Secondary | ICD-10-CM | POA: Diagnosis not present

## 2019-04-16 DIAGNOSIS — R918 Other nonspecific abnormal finding of lung field: Secondary | ICD-10-CM | POA: Diagnosis not present

## 2019-04-16 DIAGNOSIS — R1013 Epigastric pain: Secondary | ICD-10-CM | POA: Diagnosis not present

## 2019-04-16 DIAGNOSIS — Z79899 Other long term (current) drug therapy: Secondary | ICD-10-CM | POA: Diagnosis not present

## 2019-04-16 DIAGNOSIS — R410 Disorientation, unspecified: Secondary | ICD-10-CM | POA: Diagnosis not present

## 2019-04-16 DIAGNOSIS — H269 Unspecified cataract: Secondary | ICD-10-CM | POA: Diagnosis not present

## 2019-04-16 DIAGNOSIS — G8912 Acute post-thoracotomy pain: Secondary | ICD-10-CM | POA: Diagnosis not present

## 2019-04-16 DIAGNOSIS — Z90710 Acquired absence of both cervix and uterus: Secondary | ICD-10-CM | POA: Diagnosis not present

## 2019-04-16 DIAGNOSIS — T80319A ABO incompatibility with hemolytic transfusion reaction, unspecified, initial encounter: Secondary | ICD-10-CM | POA: Diagnosis not present

## 2019-04-16 DIAGNOSIS — G8929 Other chronic pain: Secondary | ICD-10-CM | POA: Diagnosis not present

## 2019-04-16 DIAGNOSIS — Z79891 Long term (current) use of opiate analgesic: Secondary | ICD-10-CM | POA: Diagnosis not present

## 2019-04-16 DIAGNOSIS — K219 Gastro-esophageal reflux disease without esophagitis: Secondary | ICD-10-CM | POA: Diagnosis not present

## 2019-04-16 DIAGNOSIS — Z9889 Other specified postprocedural states: Secondary | ICD-10-CM | POA: Diagnosis not present

## 2019-04-16 DIAGNOSIS — H9190 Unspecified hearing loss, unspecified ear: Secondary | ICD-10-CM | POA: Diagnosis not present

## 2019-04-16 DIAGNOSIS — M8580 Other specified disorders of bone density and structure, unspecified site: Secondary | ICD-10-CM | POA: Diagnosis not present

## 2019-04-16 DIAGNOSIS — G4700 Insomnia, unspecified: Secondary | ICD-10-CM | POA: Diagnosis not present

## 2019-04-16 DIAGNOSIS — K449 Diaphragmatic hernia without obstruction or gangrene: Secondary | ICD-10-CM | POA: Diagnosis not present

## 2019-04-16 DIAGNOSIS — G8918 Other acute postprocedural pain: Secondary | ICD-10-CM | POA: Diagnosis not present

## 2019-04-16 DIAGNOSIS — E039 Hypothyroidism, unspecified: Secondary | ICD-10-CM | POA: Diagnosis not present

## 2019-04-16 DIAGNOSIS — K2950 Unspecified chronic gastritis without bleeding: Secondary | ICD-10-CM | POA: Diagnosis not present

## 2019-04-16 DIAGNOSIS — G4733 Obstructive sleep apnea (adult) (pediatric): Secondary | ICD-10-CM | POA: Diagnosis not present

## 2019-04-16 DIAGNOSIS — I471 Supraventricular tachycardia: Secondary | ICD-10-CM | POA: Diagnosis not present

## 2019-04-16 DIAGNOSIS — E669 Obesity, unspecified: Secondary | ICD-10-CM | POA: Diagnosis not present

## 2019-04-16 DIAGNOSIS — F419 Anxiety disorder, unspecified: Secondary | ICD-10-CM | POA: Diagnosis not present

## 2019-04-16 DIAGNOSIS — J90 Pleural effusion, not elsewhere classified: Secondary | ICD-10-CM | POA: Diagnosis not present

## 2019-04-16 DIAGNOSIS — M1990 Unspecified osteoarthritis, unspecified site: Secondary | ICD-10-CM | POA: Diagnosis not present

## 2019-04-16 DIAGNOSIS — Z1159 Encounter for screening for other viral diseases: Secondary | ICD-10-CM | POA: Diagnosis not present

## 2019-04-16 DIAGNOSIS — K3189 Other diseases of stomach and duodenum: Secondary | ICD-10-CM | POA: Diagnosis not present

## 2019-04-16 DIAGNOSIS — D649 Anemia, unspecified: Secondary | ICD-10-CM | POA: Diagnosis not present

## 2019-04-16 DIAGNOSIS — R531 Weakness: Secondary | ICD-10-CM | POA: Diagnosis not present

## 2019-04-16 DIAGNOSIS — R079 Chest pain, unspecified: Secondary | ICD-10-CM | POA: Diagnosis not present

## 2019-04-27 DIAGNOSIS — F341 Dysthymic disorder: Secondary | ICD-10-CM | POA: Diagnosis not present

## 2019-04-27 DIAGNOSIS — I471 Supraventricular tachycardia: Secondary | ICD-10-CM | POA: Diagnosis not present

## 2019-04-27 DIAGNOSIS — K449 Diaphragmatic hernia without obstruction or gangrene: Secondary | ICD-10-CM | POA: Diagnosis not present

## 2019-04-27 DIAGNOSIS — K219 Gastro-esophageal reflux disease without esophagitis: Secondary | ICD-10-CM | POA: Diagnosis not present

## 2019-04-27 DIAGNOSIS — E669 Obesity, unspecified: Secondary | ICD-10-CM | POA: Diagnosis not present

## 2019-04-27 DIAGNOSIS — Z48815 Encounter for surgical aftercare following surgery on the digestive system: Secondary | ICD-10-CM | POA: Diagnosis not present

## 2019-04-27 DIAGNOSIS — E039 Hypothyroidism, unspecified: Secondary | ICD-10-CM | POA: Diagnosis not present

## 2019-04-30 DIAGNOSIS — K449 Diaphragmatic hernia without obstruction or gangrene: Secondary | ICD-10-CM | POA: Diagnosis not present

## 2019-04-30 DIAGNOSIS — Z4803 Encounter for change or removal of drains: Secondary | ICD-10-CM | POA: Diagnosis not present

## 2019-04-30 DIAGNOSIS — Z9889 Other specified postprocedural states: Secondary | ICD-10-CM | POA: Diagnosis not present

## 2019-04-30 DIAGNOSIS — K319 Disease of stomach and duodenum, unspecified: Secondary | ICD-10-CM | POA: Diagnosis not present

## 2019-04-30 DIAGNOSIS — J90 Pleural effusion, not elsewhere classified: Secondary | ICD-10-CM | POA: Diagnosis not present

## 2019-05-03 ENCOUNTER — Other Ambulatory Visit: Payer: Self-pay

## 2019-05-03 ENCOUNTER — Emergency Department (HOSPITAL_COMMUNITY): Payer: BC Managed Care – PPO

## 2019-05-03 ENCOUNTER — Encounter (HOSPITAL_COMMUNITY): Payer: Self-pay | Admitting: Emergency Medicine

## 2019-05-03 ENCOUNTER — Emergency Department (HOSPITAL_BASED_OUTPATIENT_CLINIC_OR_DEPARTMENT_OTHER): Payer: BC Managed Care – PPO

## 2019-05-03 ENCOUNTER — Emergency Department (HOSPITAL_COMMUNITY)
Admission: EM | Admit: 2019-05-03 | Discharge: 2019-05-03 | Disposition: A | Discharge status: Home / Self Care | Payer: BC Managed Care – PPO | Attending: Emergency Medicine | Admitting: Emergency Medicine

## 2019-05-03 DIAGNOSIS — R609 Edema, unspecified: Secondary | ICD-10-CM | POA: Diagnosis not present

## 2019-05-03 DIAGNOSIS — R079 Chest pain, unspecified: Secondary | ICD-10-CM | POA: Diagnosis not present

## 2019-05-03 DIAGNOSIS — R0602 Shortness of breath: Secondary | ICD-10-CM

## 2019-05-03 DIAGNOSIS — R600 Localized edema: Secondary | ICD-10-CM | POA: Diagnosis not present

## 2019-05-03 DIAGNOSIS — Z79899 Other long term (current) drug therapy: Secondary | ICD-10-CM | POA: Insufficient documentation

## 2019-05-03 DIAGNOSIS — M7989 Other specified soft tissue disorders: Secondary | ICD-10-CM | POA: Diagnosis not present

## 2019-05-03 LAB — COMPREHENSIVE METABOLIC PANEL
ALT: 21 U/L (ref 0–44)
AST: 22 U/L (ref 15–41)
Albumin: 2.9 g/dL — ABNORMAL LOW (ref 3.5–5.0)
Alkaline Phosphatase: 74 U/L (ref 38–126)
Anion gap: 9 (ref 5–15)
BUN: 8 mg/dL (ref 8–23)
CO2: 30 mmol/L (ref 22–32)
Calcium: 8.3 mg/dL — ABNORMAL LOW (ref 8.9–10.3)
Chloride: 100 mmol/L (ref 98–111)
Creatinine, Ser: 0.61 mg/dL (ref 0.44–1.00)
GFR calc Af Amer: >60 mL/min (ref >60)
GFR calc non Af Amer: >60 mL/min (ref >60)
Glucose, Bld: 109 mg/dL — ABNORMAL HIGH (ref 70–99)
Potassium: 3.3 mmol/L — ABNORMAL LOW (ref 3.5–5.1)
Sodium: 139 mmol/L (ref 135–145)
Total Bilirubin: 0.2 mg/dL — ABNORMAL LOW (ref 0.3–1.2)
Total Protein: 6.1 g/dL — ABNORMAL LOW (ref 6.5–8.1)

## 2019-05-03 LAB — CBC WITH DIFFERENTIAL/PLATELET
Abs Immature Granulocytes: 0.33 10*3/uL — ABNORMAL HIGH (ref 0.00–0.07)
Basophils Absolute: 0.1 10*3/uL (ref 0.0–0.1)
Basophils Relative: 1 %
Eosinophils Absolute: 0.3 10*3/uL (ref 0.0–0.5)
Eosinophils Relative: 3 %
HCT: 30.8 % — ABNORMAL LOW (ref 36.0–46.0)
Hemoglobin: 9.5 g/dL — ABNORMAL LOW (ref 12.0–15.0)
Immature Granulocytes: 4 %
Lymphocytes Relative: 19 %
Lymphs Abs: 1.7 10*3/uL (ref 0.7–4.0)
MCH: 29.1 pg (ref 26.0–34.0)
MCHC: 30.8 g/dL (ref 30.0–36.0)
MCV: 94.2 fL (ref 80.0–100.0)
Monocytes Absolute: 1.1 10*3/uL — ABNORMAL HIGH (ref 0.1–1.0)
Monocytes Relative: 13 %
Neutro Abs: 5.4 10*3/uL (ref 1.7–7.7)
Neutrophils Relative %: 60 %
Platelets: 491 10*3/uL — ABNORMAL HIGH (ref 150–400)
RBC: 3.27 MIL/uL — ABNORMAL LOW (ref 3.87–5.11)
RDW: 14.6 % (ref 11.5–15.5)
WBC: 8.9 10*3/uL (ref 4.0–10.5)
nRBC: 0 % (ref 0.0–0.2)

## 2019-05-03 LAB — TROPONIN I (HIGH SENSITIVITY): Troponin I (High Sensitivity): 3 ng/L (ref <18)

## 2019-05-03 LAB — BRAIN NATRIURETIC PEPTIDE: B Natriuretic Peptide: 23.1 pg/mL (ref 0.0–100.0)

## 2019-05-03 MED ORDER — IOHEXOL 350 MG/ML SOLN
100.0000 mL | Freq: Once | INTRAVENOUS | Status: AC | PRN
  Administered 2019-05-03: 100 mL via INTRAVENOUS

## 2019-05-03 MED ORDER — SODIUM CHLORIDE (PF) 0.9 % IJ SOLN
INTRAMUSCULAR | Status: AC
  Filled 2019-05-03: qty 50

## 2019-05-03 MED ORDER — ACETAMINOPHEN 325 MG PO TABS
650.0000 mg | ORAL_TABLET | Freq: Once | ORAL | Status: AC
  Administered 2019-05-03: 650 mg via ORAL
  Filled 2019-05-03: qty 2

## 2019-05-03 NOTE — ED Triage Notes (Signed)
Pt c/o bilat leg swelling since last week. Last night started having SOB with exertion. Was sent to ED by PCP. Had thoracotomy 2 weeks ago, has incisional pains with breathing.

## 2019-05-03 NOTE — Progress Notes (Signed)
VASCULAR LAB PRELIMINARY  PRELIMINARY  PRELIMINARY  PRELIMINARY  Bilateral lower extremity venous duplex completed.    Preliminary report:  See CV proc for preliminary results.   Salena Saner, PA-C report  Sharion Dove, RVT 05/03/2019, 6:27 PM

## 2019-05-03 NOTE — Discharge Instructions (Addendum)
You have been diagnosed today with shortness of breath and lower extremity swelling.  At this time there does not appear to be the presence of an emergent medical condition, however there is always the potential for conditions to change. Please read and follow the below instructions.  Please return to the Emergency Department immediately for any new or worsening symptoms. Please be sure to follow up with your Primary Care Provider within one week regarding your visit today; please call their office to schedule an appointment even if you are feeling better for a follow-up visit. Your hemoglobin level was low today at 9.5.  Please discuss this with your primary doctor as recheck of your blood count may be necessary.  Call their office tomorrow to schedule this appointment.  If you develop any symptoms of bleeding including dark stools, blood in your stool or blood in your cough or if you vomit return to the emergency department immediately. Your CT scan today showed soft tissue changes around your esophagus with a small amount of fluid, discussed this with your surgeon and primary care provider at your next visit. Your CT scan showed small bilateral pleural effusions, discussed this with your surgeon and your primary care provider at your next visit. Your CT scan today showed aortic atherosclerosis and the multiple left rib fractures, discussed both these findings with your primary care provider at your next visit. Your CT scan showed a 12 mm groundglass nodular density in your right upper lobe of your lung, discussed this with your primary care provider at your next visit. Below I have attached the results of your CT scan and your chest x-ray, discussed these with your primary care provider at your next visit.  Get help right away if: You have chest pain. You cannot breathe when you lie down. You have pain, redness, or warmth in the swollen areas. Your shortness of breath gets worse. You have  trouble breathing when you are resting. You feel light-headed or you pass out (faint). You cough up blood. You have pain with breathing. You have pain in your chest, arms, shoulders, or belly (abdomen). You have a fever. You cannot walk up stairs. You cannot exercise the way you normally do. Any new/concerning or worsening symptoms  Please read the additional information packets attached to your discharge summary.  Do not take your medicine if  develop an itchy rash, swelling in your mouth or lips, or difficulty breathing; call 911 and seek immediate emergency medical attention if this occurs.  Chest X-ray:  IMPRESSION:  1. Small bilateral pleural effusions.  2. Multiple left-sided mildly displaced rib fractures are noted  without evidence for a left-sided pneumothorax.  3. Scattered airspace opacities at the left lung base may represent  postoperative atelectasis, however a pneumonia is not excluded.  4. Persistent cardiomegaly.      CT scan of Chest:  IMPRESSION:  1. No CT evidence of pulmonary embolism.  2. Postsurgical changes of hiatal hernia repair. There is soft  tissue thickening of the distal esophagus with small amount of  surrounding fluid, likely postoperative. No definite drainable fluid  collection identified.  3. Small bilateral pleural effusions with bibasilar linear densities  likely atelectasis and less likely infiltrate. Clinical correlation  is recommended.  4. Aortic Atherosclerosis (ICD10-I70.0).  5. Multiple left rib fractures, likely related to thoracotomy.  Clinical correlation is recommended.  6. A 12 mm ground-glass nodular density in the right upper lobe, new  since the prior CTs and likely an area  of vascular and parenchymal  confluent or atelectasis. Attention on follow-up imaging  recommended.

## 2019-05-03 NOTE — ED Provider Notes (Addendum)
[facility] DEPT Provider Note   CSN: [id number] Arrival date & time: 05/03/19  1508    History   Chief Complaint Chief Complaint  Patient presents with   Leg Swelling   Shortness of Breath    HPI Rebecca Marshall is a 68 y.o. female history of thyroid cancer, atrial dysrhythmia, GERD, obesity, presents today with shortness of breath, chest pain and bilateral leg swelling after undergoing thoracotomy 2 weeks ago.  Patient called her primary care doctor's office today but was referred to the ER for evaluation due to concern of possible pulmonary embolism.  Patient underwent left thoracotomy for hiatal hernia repair and pylorus gastro-plexi on 04/19/2019.  Patient reports that she has had a moderate left-sided chest pain since that time that has not changed in nature and is worse with inspiration, nonradiating and without aggravating or alleviating factors.  She reports she has been using her incentive spirometer as directed by her surgeon.  Patient reports that on Wednesday, 04/28/2019 she developed bilateral lower extremity edema she describes as "2+ pitting edema" that has remained since that time she describes asymmetrical lower extremity swelling, reports that she feels her left leg is somewhat larger than the right leg today, she denies color change or pain of the legs, no clear aggravating or alleviating factors.  Patient reports that last night after dinner she developed shortness of breath with exertion reports that with any activity her shortness of breath returns, she denies any worsening of her chest pain with her shortness of breath.  She denies similar in the past or clear aggravating or alleviating factors.  She denies any shortness of breath on initial evaluation.  Denies fever/chills, cough/sputum production, hemoptysis, abdominal pain, nausea/vomiting, diarrhea or any additional concerns.    HPI  Past Medical History:  Diagnosis Date   Acne  nodule 11/26/2011   ADENOCARCINOMA, THYROID GLAND, PAPILLARY 05/17/2009   04/2009 Two surgeries for thyroid cancer Path report from last surgery :THYROID, LEFT, HEMITHYROIDECTOMY: - MULTIPLE FOCI OF FOLLICULAR VARIANT OR PAPILLARY THYROID CARCINOMA, 1.7 CM IN GREATEST DIMENSION, CONFINED WITHIN THE THYROID TISSUE. - ANGIOLYMPHATIC INVASION PRESENT. - ISTHMUS RESECTION MARGIN INVOLVED BY TUMOR. - FOLLICULAR ADENOMA(S) WITH ASSOCIATED LYMPHOCYTIC THYROIDITIS.      AK (actinic keratosis) 04/16/2012   ALLERGIC RHINITIS 01/30/2009   Qualifier: Diagnosis of  By: Andria Frames MD, [place] 09/03/2010   Long standing depression with anxiety. Good response to medications initially. Has been to [facility].     Arthritis    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 08/11/2007   LEFT JAS:NKNLZ hip replacement (Dr Berenice Primas) with recalled hardware. Had revision by Dr Berenice Primas in 2011 with  ASR .Marland Kitchen     Atrial dysrhythmia    FOLLOWED BY DR Caryl Comes WAS HAVING PALPITATIONS AND SYNCOPAL EPISODES ONSET 9 YEARS AGO ; STARTED ON FLECAIINIDE , PER PATIENT TOLERATES WELL, LAST SYNCOPAL EPISODE WAS OVER 5 YEARS AGO ; SEE LOV IN  EPIC    Atrial tachycardia (HCC) 06/05/2011   \Treated with flecainide-2011 2010-QRS duration 88 ms 2011 QRS duration 88 ms 2014 QRS duration 98 ms 2014 QRS 120mg    120 ms     Bilateral bunions 12/18/2010   Bilateral knee pain 06/29/2015   Cancer (HCC)    thyroid-status post resection on replacement Dr. Chalmers Cater   Clinical depression 03/06/2016   Cognitive changes 7/67/3419   Complication of anesthesia    FENTANYL CAUSED ANAPHYLAXIS DURING CA COLONOSCOPY  Depression    Dysrhythmia    Dr Caryl Comes monitoring patient, atrial tachycardia under control, cardiac clearance 09/10/17   Family history of diabetes mellitus (DM) 05/11/2014   Fibromyalgia    Gait abnormality 12/18/2010   GE reflux    GERD 07/14/2007   Qualifier: Diagnosis of  By: Nori Riis MD, Clarise Cruz     Headache     migraines   Headache upon awakening 06/05/2011   Herniated lumbar disc without myelopathy 12/10/2013   Had surgery by neurosurgery in December, 2014.L4-5 laminotomy and microdiscectomy Dr Margreta Journey Done at [facility]    Hip pain, chronic 04/05/2015   Status post THR plus revision.    Hypoxemia 01/30/2015   Memory change 05/12/2014   Reports problems with short-term memory, focusing, concentration in the last 6-12 months. Problems were significant enough that she had to quit her part-time job is fairly distressed about this.    Metatarsalgia 12/18/2010   Nausea & vomiting 06/05/2011   Nocturia more than twice per night 01/30/2015   Nonallopathic lesion of cervical region 11/26/2011   Nonallopathic lesion of lumbosacral region 11/26/2011   OSA on CPAP 01/30/2015   Osteoporosis    PONV (postoperative nausea and vomiting)    did not have nausea/vomiting with nissen fundoplication done 23/5361 at [facility]   Poor compliance with CPAP treatment 03/24/2017   Rash and nonspecific skin eruption 01/09/2016    located in the 10 o'clock position on Right breast     Severe obesity (BMI >= 40) ([place]) 01/30/2015   Sleep apnea 10/2015   x 1 year, CPAP   Snorings 01/30/2015   Status post Nissen fundoplication 44/11/1538   Subacute cough 03/27/2012   Thyroid disease    Well adult health check 06/05/2011    Patient Active Problem List   Diagnosis Date Noted   Paraesophageal hernia 01/29/2019   Hiatal hernia with GERD 06/05/2018   Absolute anemia 05/12/2018   Episodic cluster headache, not intractable 05/12/2018   Morbid obesity ([place]) 05/12/2018   Chest pain due to GERD 05/12/2018   Iron deficiency anemia 04/07/2018   Primary osteoarthritis of right hip 09/12/2017   Status post Nissen fundoplication 08/67/6195   Poor compliance with CPAP treatment 03/24/2017   Dysphonia 02/05/2017   Depression headache 03/06/2016   Clinical depression 03/06/2016   Rash and  nonspecific skin eruption 01/09/2016   Bilateral knee pain 06/29/2015   Hip pain, chronic 04/05/2015   OSA on CPAP 01/30/2015   Snorings 01/30/2015   Severe obesity (BMI >= 40) ([place]) 01/30/2015   Nocturia more than twice per night 01/30/2015   Hypoxemia 01/30/2015   OSA (obstructive sleep apnea) 11/01/2014   Cognitive changes 10/12/2014   Memory change 05/12/2014   Family history of diabetes mellitus (DM) 05/11/2014   Herniated lumbar disc without myelopathy 12/10/2013   AK (actinic keratosis) 04/16/2012   Atrial tachycardia ([place]) 06/05/2011   Bilateral bunions 12/18/2010   Metatarsalgia 12/18/2010   ANXIETY DEPRESSION 09/03/2010   ADENOCARCINOMA, THYROID GLAND, PAPILLARY 05/17/2009   ALLERGIC RHINITIS 01/30/2009   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 08/11/2007   GERD 07/14/2007   FIBROMYALGIA 07/14/2007    Past Surgical History:  Procedure Laterality Date   ABDOMINAL HYSTERECTOMY     BREAST EXCISIONAL BIOPSY Right 25+ yrs ago   benign   caroitid dopplers Bilateral 11/2015   normal   CATARACT EXTRACTION Bilateral 09/05/15, 09/12/15   CHOLECYSTECTOMY     COLONOSCOPY  04/2011   ESOPHAGEAL MANOMETRY N/A 03/31/2017  Procedure: ESOPHAGEAL MANOMETRY (EM);  Surgeon: Ronald Lobo, MD;  Location: [facility] END[facility]COPY;  Service: Endoscopy;  Laterality: N/A;   HIATAL HERNIA REPAIR N/A 06/05/2018   Procedure: LAPAROSCOPIC  ENTERO-LYSIS  ERAS PATHWAY;  Surgeon: Johnathan Hausen, MD;  Location: [facility] ORS;  Service: General;  Laterality: N/A;   LAPAROSCOPIC NISSEN FUNDOPLICATION N/A 40/05/8118   Procedure: LAPAROSCOPIC NISSEN FUNDOPLICATION;  Surgeon: Johnathan Hausen, MD;  Location: [facility] ORS;  Service: General;  Laterality: N/A;   THYROIDECTOMY  04/2009   papillary carcimao Dr. Erik Obey   TOE SURGERY     bone spur removed - rt great toe   TOTAL HIP ARTHROPLASTY  2009 and 2011   L hip    TOTAL HIP ARTHROPLASTY Right 09/12/2017   Procedure: TOTAL HIP  ARTHROPLASTY ANTERIOR APPROACH;  Surgeon: Dorna Leitz, MD;  Location: [facility];  Service: Orthopedics;  Laterality: Right;   TUBAL LIGATION     UPPER GI ENDOSCOPY  06/05/2018   Procedure: UPPER GI ENDOSCOPY;  Surgeon: Johnathan Hausen, MD;  Location: [facility] ORS;  Service: General;;     OB History   No obstetric history on file.      Home Medications    Prior to Admission medications   Medication Sig Start Date End Date Taking? Authorizing Provider  acetaminophen (TYLENOL) 500 MG tablet Take 1,000 mg by mouth every 8 (eight) hours as needed for mild pain or moderate pain.     [provider]  cyclobenzaprine (FLEXERIL) 10 MG tablet Take 10 mg by mouth 2 (two) times daily.    [provider]  eletriptan (RELPAX) 40 MG tablet Take by mouth one at the beginning of headache and may repeat once in 2  Hours for maximum of 2 tabs in 24hours 04/05/15   Dickie La, MD  flecainide (TAMBOCOR) 150 MG tablet Take 1 tablet (150 mg total) by mouth 2 (two) times daily. 07/21/18   Deboraha Sprang, MD  levothyroxine (SYNTHROID) 150 MCG tablet Take 150 mcg by mouth daily before breakfast.    [provider]  Melatonin 10 MG TABS Take 10 mg by mouth at bedtime as needed (for sleep.).    [provider]  ondansetron (ZOFRAN ODT) 4 MG disintegrating tablet 4mg  ODT q4 hours prn nausea/vomit 06/28/17   Stark Klein, MD  sertraline (ZOLOFT) 100 MG tablet Take 200 mg by mouth daily.    [provider]  topiramate (TOPAMAX) 50 MG tablet  07/20/18   [provider]  traZODone (DESYREL) 100 MG tablet Take 300 mg by mouth at bedtime.    [provider]    Family History Family History  Problem Relation Age of Onset   Cancer Other    Alcohol abuse Mother    Cancer Mother 59       Lung    Alcohol abuse Father    Cancer Sister 75       Breast    Social History Social History   Tobacco Use   Smoking status: Never Smoker   Smokeless tobacco:  Never Used  Substance Use Topics   Alcohol use: No    Alcohol/week: 0.0 standard drinks    Comment: none   Drug use: No     Allergies   Nsaids and Codeine   Review of Systems Review of Systems Ten systems are reviewed and are negative for acute change except as noted in the HPI  Physical Exam Updated Vital Signs BP 115/79 (BP Location: Left Arm)    Pulse 84  Temp 99.1 F (37.3 C) (Oral)    Resp 16    SpO2 94%   Physical Exam Constitutional:      General: She is not in acute distress.    Appearance: Normal appearance. She is well-developed. She is obese. She is not ill-appearing or diaphoretic.  HENT:     Head: Normocephalic and atraumatic.     Right Ear: External ear normal.     Left Ear: External ear normal.     Nose: Nose normal.  Eyes:     General: Vision grossly intact. Gaze aligned appropriately.     Pupils: Pupils are equal, round, and reactive to light.  Neck:     Musculoskeletal: Normal range of motion.     Trachea: Trachea and phonation normal. No tracheal deviation.  Cardiovascular:     Rate and Rhythm: Normal rate and regular rhythm.     Pulses:          Radial pulses are 2+ on the right side and 2+ on the left side.       Dorsalis pedis pulses are 2+ on the right side and 2+ on the left side.     Heart sounds: Normal heart sounds.  Pulmonary:     Effort: Pulmonary effort is normal. No respiratory distress.     Breath sounds: Normal breath sounds. No wheezing or rhonchi.  Chest:       Comments: Left-sided thoracotomy scar, well-healing without erythema, drainage or dehiscence. Abdominal:     General: There is no distension.     Palpations: Abdomen is soft.     Tenderness: There is no abdominal tenderness. There is no guarding or rebound.  Musculoskeletal: Normal range of motion.     Right lower leg: 1+ Edema present.     Left lower leg: 1+ Edema present.  Skin:    General: Skin is warm and dry.  Neurological:     Mental Status: She is alert.       GCS: GCS eye subscore is 4. GCS verbal subscore is 5. GCS motor subscore is 6.     Comments: Speech is clear and goal oriented, follows commands Major Cranial nerves without deficit, no facial droop Moves extremities without ataxia, coordination intact  Psychiatric:        Behavior: Behavior normal.      ED Treatments / Results  Labs (all labs ordered are listed, but only abnormal results are displayed) Labs Reviewed  CBC WITH DIFFERENTIAL/PLATELET - Abnormal; Notable for the following components:      Result Value   RBC 3.27 (*)    Hemoglobin 9.5 (*)    HCT 30.8 (*)    Platelets 491 (*)    Monocytes Absolute 1.1 (*)    Abs Immature Granulocytes 0.33 (*)    All other components within normal limits  COMPREHENSIVE METABOLIC PANEL - Abnormal; Notable for the following components:   Potassium 3.3 (*)    Glucose, Bld 109 (*)    Calcium 8.3 (*)    Total Protein 6.1 (*)    Albumin 2.9 (*)    Total Bilirubin 0.2 (*)    All other components within normal limits  BRAIN NATRIURETIC PEPTIDE  TROPONIN I (HIGH SENSITIVITY)    EKG EKG Interpretation  Date/Time:  Monday May 03 2019 20:15:27 EDT Ventricular Rate:  82 PR Interval:    QRS Duration: 112 QT Interval:  415 QTC Calculation: 485 R Axis:   16 Text Interpretation:  Sinus rhythm No ST segment changes Confirmed  by Madalyn Rob [phone]) on 05/03/2019 8:31:03 PM   Radiology Dg Chest 2 View  Result Date: 05/03/2019 CLINICAL DATA:  Shortness of breath EXAM: CHEST - 2 VIEW COMPARISON:  September 14, 2018 FINDINGS: The heart is enlarged. There are small bilateral pleural effusions. New opacities at the left lung base are nonspecific. There are multiple acute appearing left-sided rib fractures. There is no pneumothorax. There is a small right-sided pleural effusion. Aortic calcifications are noted. IMPRESSION: 1. Small bilateral pleural effusions. 2. Multiple left-sided mildly displaced rib fractures are noted without  evidence for a left-sided pneumothorax. 3. Scattered airspace opacities at the left lung base may represent postoperative atelectasis, however a pneumonia is not excluded. 4. Persistent cardiomegaly. Electronically Signed   By: Constance Holster M.D.   On: 05/03/2019 16:33   Ct Angio Chest Pe W And/or Wo Contrast  Result Date: 05/03/2019 CLINICAL DATA:  68 year old female with left thoracotomy for hiatal hernia 2 weeks ago. Patient presenting with left-sided chest pain and shortness of breath and bilateral leg swelling. EXAM: CT ANGIOGRAPHY CHEST WITH CONTRAST TECHNIQUE: Multidetector CT imaging of the chest was performed using the standard protocol during bolus administration of intravenous contrast. Multiplanar CT image reconstructions and MIPs were obtained to evaluate the vascular anatomy. CONTRAST:  110mL OMNIPAQUE IOHEXOL 350 MG/ML SOLN COMPARISON:  Chest CT dated 03/24/2019 FINDINGS: Evaluation of this exam is limited due to respiratory motion artifact. Cardiovascular: There is mild cardiomegaly. No pericardial effusion. Mild atherosclerotic calcification of the thoracic aorta. No aneurysmal dilatation or dissection. The origins of the great vessels of the aortic arch are patent. No pulmonary artery embolus identified. Mediastinum/Nodes: There is no hilar or mediastinal adenopathy. Postsurgical changes of hiatal hernia repair with multiple surgical staples and sutures. There is soft tissue thickening of the distal esophagus, likely postoperative. Evaluation is limited due to respiratory motion artifact. No definite drainable fluid collection identified. There is fluid extending into the anterior paravertebral space and adjacent to the aorta. Lungs/Pleura: There are small bilateral pleural effusions bibasilar linear densities may represent atelectasis or infiltrate. No lobar consolidation. There is a 12 mm ground-glass nodular density in the right upper lobe adjacent the right hilum (series 10 image 64).  There is no pneumothorax. The central airways are patent. Upper Abdomen: A 12 mm indeterminate left adrenal nodule. Cholecystectomy clips and mild dilatation of the intrahepatic biliary tree. Musculoskeletal: Minimally displaced fracture of the lateral left seventh rib. Fractures of the posterior left eighth and ninth ribs. These fractures may be postsurgical and related to thoracotomy. Clinical correlation is recommended. There is degenerative changes of the spine. Review of the MIP images confirms the above findings. IMPRESSION: 1. No CT evidence of pulmonary embolism. 2. Postsurgical changes of hiatal hernia repair. There is soft tissue thickening of the distal esophagus with small amount of surrounding fluid, likely postoperative. No definite drainable fluid collection identified. 3. Small bilateral pleural effusions with bibasilar linear densities likely atelectasis and less likely infiltrate. Clinical correlation is recommended. 4. Aortic Atherosclerosis (ICD10-I70.0). 5. Multiple left rib fractures, likely related to thoracotomy. Clinical correlation is recommended. 6. A 12 mm ground-glass nodular density in the right upper lobe, new since the prior CTs and likely an area of vascular and parenchymal confluent or atelectasis. Attention on follow-up imaging recommended. Electronically Signed   By: Anner Crete M.D.   On: 05/03/2019 19:32   Vas [place] Lower Extremity Venous (dvt) (only [facility])  Result Date: 05/03/2019  Lower Venous Study Indications: Edema.  Comparison Study:  No prior study on file for comparison Performing Technologist: Sharion Dove RVS  Examination Guidelines: A complete evaluation includes B-mode imaging, spectral Doppler, color Doppler, and power Doppler as needed of all accessible portions of each vessel. Bilateral testing is considered an integral part of a complete examination. Limited examinations for reoccurring indications may be performed as noted.   +---------+---------------+---------+-----------+----------+-------+  RIGHT     Compressibility Phasicity Spontaneity Properties Summary  +---------+---------------+---------+-----------+----------+-------+  CFV       Full            Yes       Yes                             +---------+---------------+---------+-----------+----------+-------+  SFJ       Full                                                      +---------+---------------+---------+-----------+----------+-------+  FV Prox   Full                                                      +---------+---------------+---------+-----------+----------+-------+  FV Mid    Full                                                      +---------+---------------+---------+-----------+----------+-------+  FV Distal Full                                                      +---------+---------------+---------+-----------+----------+-------+  PFV       Full                                                      +---------+---------------+---------+-----------+----------+-------+  POP       Full            Yes       Yes                             +---------+---------------+---------+-----------+----------+-------+  PTV       Full                                                      +---------+---------------+---------+-----------+----------+-------+  PERO      Full                                                      +---------+---------------+---------+-----------+----------+-------+   +---------+---------------+---------+-----------+----------+-------+  LEFT      Compressibility Phasicity Spontaneity Properties Summary  +---------+---------------+---------+-----------+----------+-------+  CFV       Full            Yes       Yes                             +---------+---------------+---------+-----------+----------+-------+  SFJ       Full                                                      +---------+---------------+---------+-----------+----------+-------+  FV Prox   Full                                                       +---------+---------------+---------+-----------+----------+-------+  FV Mid    Full                                                      +---------+---------------+---------+-----------+----------+-------+  FV Distal Full                                                      +---------+---------------+---------+-----------+----------+-------+  PFV       Full                                                      +---------+---------------+---------+-----------+----------+-------+  POP       Full            Yes       Yes                             +---------+---------------+---------+-----------+----------+-------+  PTV       Full                                                      +---------+---------------+---------+-----------+----------+-------+  PERO      Full                                                      +---------+---------------+---------+-----------+----------+-------+     Summary: Right: There is no evidence of deep vein thrombosis in the lower extremity. Left: There is no evidence of deep vein thrombosis in the lower extremity.  *See table(s) above for measurements and observations.  Preliminary     Procedures Procedures (including critical care time)  Medications Ordered in ED Medications  sodium chloride (PF) 0.9 % injection (has no administration in time range)  acetaminophen (TYLENOL) tablet 650 mg (650 mg Oral Given 05/03/19 1811)  iohexol (OMNIPAQUE) 350 MG/ML injection 100 mL (100 mLs Intravenous Contrast Given 05/03/19 1906)     Initial Impression / Assessment and Plan / ED Course  I have reviewed the triage vital signs and the nursing notes.  Pertinent labs & imaging results that were available during my care of the patient were reviewed by me and considered in my medical decision making (see chart for details).    HS troponin: 3, after multiple days of chest pain and greater than 12 hours since onset of SOB, doubt  ACS, no indication for delta troponin BNP within normal limits CMP nonacute CBC with hemoglobin 9.5, decreased from baseline, patient 2 weeks post thoracotomy, she denies any GI bleed symptoms including melena, bright blood per rectum, hemoptysis or hematemesis, low suspicion for bleed at this time patient follow-up with PCP for repeat CBC next week.  Patient reports history of anemia. No indication for transfusion at this time.  DG chest:  IMPRESSION:  1. Small bilateral pleural effusions.  2. Multiple left-sided mildly displaced rib fractures are noted  without evidence for a left-sided pneumothorax.  3. Scattered airspace opacities at the left lung base may represent  postoperative atelectasis, however a pneumonia is not excluded.  4. Persistent cardiomegaly.     DVT US Bilateral LE:  Summary:  Right: There is no evidence of deep vein thrombosis in the lower extremity.  Left: There is no evidence of deep vein thrombosis in the lower extremity.   CTA PE study:  IMPRESSION:  1. No CT evidence of pulmonary embolism.  2. Postsurgical changes of hiatal hernia repair. There is soft  tissue thickening of the distal esophagus with small amount of  surrounding fluid, likely postoperative. No definite drainable fluid  collection identified.  3. Small bilateral pleural effusions with bibasilar linear densities  likely atelectasis and less likely infiltrate. Clinical correlation  is recommended.  4. Aortic Atherosclerosis (ICD10-I70.0).  5. Multiple left rib fractures, likely related to thoracotomy.  Clinical correlation is recommended.  6. A 12 mm ground-glass nodular density in the right upper lobe, new  since the prior CTs and likely an area of vascular and parenchymal  confluent or atelectasis. Attention on follow-up imaging  recommended.   EKG:  Sinus rhythm No ST segment changes Confirmed by Madalyn Rob [phone]) on 05/03/2019 8:31:03 PM --- Patient reassessed, resting  comfortably no acute distress.  She has been updated on results today and states understanding.  She plans to follow-up with her PCP this week and has a routine follow-up with her surgeon beginning of September.  At this time there does not appear to be any evidence of an acute emergency medical condition and the patient appears stable for discharge with appropriate outpatient follow up. Diagnosis was discussed with patient who verbalizes understanding of care plan and is agreeable to discharge. I have discussed return precautions with patient and her husband who verbalizes understanding of return precautions. Patient encouraged to follow-up with their PCP. All questions answered. Patient has been discharged in good condition.  Patient seen and evaluated by Dr. Roslynn Amble during this visit who agrees with plan to discharge with follow-up.   Note: Portions of this report may have been transcribed using voice recognition software. Every effort was made to  ensure accuracy; however, inadvertent computerized transcription errors may still be present. Final Clinical Impressions(s) / ED Diagnoses   Final diagnoses:  Bilateral lower extremity edema  Shortness of breath    ED Discharge Orders    None       Deliah Boston, PA-C 05/03/19 2050    Deliah Boston, PA-C 05/03/19 2051    Lucrezia Starch, MD 05/05/19 [phone]

## 2019-05-24 DIAGNOSIS — K449 Diaphragmatic hernia without obstruction or gangrene: Secondary | ICD-10-CM | POA: Diagnosis not present

## 2019-05-27 DIAGNOSIS — D649 Anemia, unspecified: Secondary | ICD-10-CM | POA: Diagnosis not present

## 2019-06-07 DIAGNOSIS — Z23 Encounter for immunization: Secondary | ICD-10-CM | POA: Diagnosis not present

## 2019-06-23 DIAGNOSIS — K449 Diaphragmatic hernia without obstruction or gangrene: Secondary | ICD-10-CM | POA: Diagnosis not present

## 2019-06-29 DIAGNOSIS — M7062 Trochanteric bursitis, left hip: Secondary | ICD-10-CM | POA: Diagnosis not present

## 2019-06-29 DIAGNOSIS — M67911 Unspecified disorder of synovium and tendon, right shoulder: Secondary | ICD-10-CM | POA: Diagnosis not present

## 2019-07-05 ENCOUNTER — Other Ambulatory Visit: Payer: Self-pay | Admitting: Internal Medicine

## 2019-07-05 DIAGNOSIS — E890 Postprocedural hypothyroidism: Secondary | ICD-10-CM | POA: Diagnosis not present

## 2019-07-09 NOTE — Progress Notes (Deleted)
PCP:  Kathyrn Lass, MD Primary Cardiologist: No primary care provider on file. Electrophysiologist: None   Rebecca Marshall is a 68 y.o. female with history of OSA on CPAP ***, atrial tachycardia, and palpitations who presents today for routine electrophysiology followup. They are seen for Dr Caryl Comes.   Since last being seen in our clinic, the patient reports doing very well.    The patient feels that she is tolerating medications without difficulties and is otherwise without complaint today.   Past Medical History:  Diagnosis Date  . Acne nodule 11/26/2011  . ADENOCARCINOMA, THYROID GLAND, PAPILLARY 05/17/2009   04/2009 Two surgeries for thyroid cancer Path report from last surgery :THYROID, LEFT, HEMITHYROIDECTOMY: - MULTIPLE FOCI OF FOLLICULAR VARIANT OR PAPILLARY THYROID CARCINOMA, 1.7 CM IN GREATEST DIMENSION, CONFINED WITHIN THE THYROID TISSUE. - ANGIOLYMPHATIC INVASION PRESENT. - ISTHMUS RESECTION MARGIN INVOLVED BY TUMOR. - FOLLICULAR ADENOMA(S) WITH ASSOCIATED LYMPHOCYTIC THYROIDITIS.     . AK (actinic keratosis) 04/16/2012  . ALLERGIC RHINITIS 01/30/2009   Qualifier: Diagnosis of  By: Andria Frames MD, Gwyndolyn Saxon    . Anxiety   . ANXIETY DEPRESSION 09/03/2010   Long standing depression with anxiety. Good response to medications initially. [facility] been to [facility].    Marland Kitchen Arthritis   . 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 Had revision by Dr Berenice Primas in 2011 with  ASR .Marland Kitchen    Marland Kitchen Atrial dysrhythmia    FOLLOWED BY DR Caryl Comes WAS HAVING PALPITATIONS AND SYNCOPAL EPISODES ONSET 9 YEARS AGO ; STARTED ON FLECAIINIDE , PER PATIENT TOLERATES WELL, LAST SYNCOPAL EPISODE WAS OVER 5 YEARS AGO ; SEE LOV IN  EPIC   . Atrial tachycardia ([place]) 06/05/2011   \Treated with flecainide-2011 2010-QRS duration 88 ms 2011 QRS duration 88 ms 2014 QRS duration 98 ms 2014 QRS 120mg    120 ms    . Bilateral bunions 12/18/2010  . Bilateral knee pain 06/29/2015   . Cancer [facility])    thyroid-status post resection on replacement Dr. Chalmers Cater  . Clinical depression 03/06/2016  . Cognitive changes 10/12/2014  . Complication of anesthesia    FENTANYL CAUSED ANAPHYLAXIS DURING CA COLONOSCOPY  . Depression   . Dysrhythmia    Dr Caryl Comes monitoring patient, atrial tachycardia under control, cardiac clearance 09/10/17  . Family history of diabetes mellitus (DM) 05/11/2014  . Fibromyalgia   . Gait abnormality 12/18/2010  . GE reflux   . GERD 07/14/2007   Qualifier: Diagnosis of  By: Nori Riis MD, Clarise Cruz    . Headache    migraines  . Headache upon awakening 06/05/2011  . Herniated lumbar disc without myelopathy 12/10/2013   Had surgery by neurosurgery in December, 2014.L4-5 laminotomy and microdiscectomy Dr Margreta Journey Done at [facility] specialty surgical center   . Hip pain, chronic 04/05/2015   Status post THR plus revision.   . Hypoxemia 01/30/2015  . Memory change 05/12/2014   Reports problems with short-term memory, focusing, concentration in the last 6-12 months. Problems were significant enough that she had to quit her part-time job is fairly distressed about this.   . Metatarsalgia 12/18/2010  . Nausea & vomiting 06/05/2011  . Nocturia more than twice per night 01/30/2015  . Nonallopathic lesion of cervical region 11/26/2011  . Nonallopathic lesion of lumbosacral region 11/26/2011  . OSA on CPAP 01/30/2015  . Osteoporosis   . PONV (postoperative nausea and vomiting)    did not have nausea/vomiting with nissen fundoplication done [id number]  at [facility]  . Poor compliance with CPAP treatment 03/24/2017  . Rash and nonspecific skin eruption 01/09/2016    located in the 10 o'clock position on Right breast    . Severe obesity (BMI >= 40) ([place]) 01/30/2015  . Sleep apnea 10/2015   x 1 year, CPAP  . Snorings 01/30/2015  . Status post Nissen fundoplication [id number]  . Subacute cough 03/27/2012  . Thyroid disease   . Well adult health check 06/05/2011   Past Surgical History:  Procedure  Laterality Date  . ABDOMINAL HYSTERECTOMY    . BREAST EXCISIONAL BIOPSY Right 25+ yrs ago   benign  . caroitid dopplers Bilateral 11/2015   normal  . CATARACT EXTRACTION Bilateral 09/05/15, 09/12/15  . CHOLECYSTECTOMY    . COLONOSCOPY  04/2011  . ESOPHAGEAL MANOMETRY N/A 03/31/2017   Procedure: ESOPHAGEAL MANOMETRY (EM);  Surgeon: Ronald Lobo, MD;  [facility];  Service: Endoscopy;  Laterality: N/A;  . HIATAL HERNIA REPAIR N/A 06/05/2018   Procedure: LAPAROSCOPIC  ENTERO-LYSIS  ERAS PATHWAY;  Surgeon: Johnathan Hausen, MD;  [facility]: [facility];  Service: General;  Laterality: N/A;  . LAPAROSCOPIC NISSEN FUNDOPLICATION N/A [id number]   Procedure: LAPAROSCOPIC NISSEN FUNDOPLICATION;  Surgeon: Johnathan Hausen, MD;  [facility];  Service: General;  Laterality: N/A;  . THYROIDECTOMY  04/2009   papillary carcimao Dr. Erik Obey  . TOE SURGERY     bone spur removed - rt great toe  . TOTAL HIP ARTHROPLASTY  2009 and 2011   L hip   . TOTAL HIP ARTHROPLASTY Right 09/12/2017   Procedure: TOTAL HIP ARTHROPLASTY ANTERIOR APPROACH;  Surgeon: Dorna Leitz, MD;  Location: [place];  Service: Orthopedics;  Laterality: Right;  . TUBAL LIGATION    . UPPER GI ENDOSCOPY  06/05/2018   Procedure: UPPER GI ENDOSCOPY;  Surgeon: Johnathan Hausen, MD;  [facility];  Service: General;;    Current Outpatient Medications  Medication Sig Dispense Refill  . acetaminophen (TYLENOL) 500 MG tablet Take 1,000 mg by mouth every 8 (eight) hours as needed for mild pain or moderate pain.     . cyclobenzaprine (FLEXERIL) 10 MG tablet Take 10 mg by mouth 2 (two) times daily.    Marland Kitchen eletriptan (RELPAX) 40 MG tablet Take by mouth one at the beginning of headache and may repeat once in 2  Hours for maximum of 2 tabs in 24hours 27 tablet 3  . flecainide (TAMBOCOR) 150 MG tablet TAKE 1 TABLET TWICE A DAY 180 tablet 0  . levothyroxine (SYNTHROID) 150 MCG tablet Take 150 mcg by mouth daily before breakfast.    . Melatonin 10  MG TABS Take 10 mg by mouth at bedtime as needed (for sleep.).    Marland Kitchen ondansetron (ZOFRAN ODT) 4 MG disintegrating tablet 4mg  ODT q4 hours prn nausea/vomit 30 tablet 1  . sertraline (ZOLOFT) 100 MG tablet Take 200 mg by mouth daily.    Marland Kitchen topiramate (TOPAMAX) 50 MG tablet     . traZODone (DESYREL) 100 MG tablet Take 300 mg by mouth at bedtime.     No current facility-administered medications for this visit.     Allergies  Allergen Reactions  . Nsaids Other (See Comments)    Rectal bleeding  . Codeine Nausea And Vomiting    Social History   Socioeconomic History  . Marital status: Married    Spouse name: [name]  . Number of children: 3  . Years of education: 12+  . Highest education level: Not on file  Occupational  History  . Occupation: unemployed   Social Needs  . Financial resource strain: Not on file  . Food insecurity    Worry: Not on file    Inability: Not on file  . Transportation needs    Medical: Not on file    Non-medical: Not on file  Tobacco Use  . Smoking status: Never Smoker  . Smokeless tobacco: Never Used  Substance and Sexual Activity  . Alcohol use: No    Alcohol/week: 0.0 standard drinks    Comment: none  . Drug use: No  . Sexual activity: Not on file  Lifestyle  . Physical activity    Days per week: Not on file    Minutes per session: Not on file  . Stress: Not on file  Relationships  . Social Herbalist on phone: Not on file    Gets together: Not on file    Attends religious service: Not on file    Active member of club or organization: Not on file    Attends meetings of clubs or organizations: Not on file    Relationship status: Not on file  . Intimate partner violence    Fear of current or ex partner: Not on file    Emotionally abused: Not on file    Physically abused: Not on file    Forced sexual activity: Not on file  Other Topics Concern  . Not on file  Social History Narrative   Patient lives at home alone    Patient is  married    Patient is not working    Patient has 3 children    Patient has her BSN RN   Patient is right handed       Daily caffeine use 1 cup daily.     Review of Systems: General: No chills, fever, night sweats or weight changes  Cardiovascular:  No chest pain, dyspnea on exertion, edema, orthopnea, palpitations, paroxysmal nocturnal dyspnea Dermatological: No rash, lesions or masses Respiratory: No cough, dyspnea Urologic: No hematuria, dysuria Abdominal: No nausea, vomiting, diarrhea, bright red blood per rectum, melena, or hematemesis Neurologic: No visual changes, weakness, changes in mental status All other systems reviewed and are otherwise negative except as noted above.  Physical Exam: There were no vitals filed for this visit.  GEN- The patient is well appearing, alert and oriented x 3 today.   HEENT: normocephalic, atraumatic; sclera clear, conjunctiva pink; hearing intact; oropharynx clear; neck supple, no JVP Lymph- no cervical lymphadenopathy Lungs- Clear to ausculation bilaterally, normal work of breathing.  No wheezes, rales, rhonchi Heart- Regular rate and rhythm, no murmurs, rubs or gallops, PMI not laterally displaced GI- soft, non-tender, non-distended, bowel sounds present, no hepatosplenomegaly Extremities- no clubbing, cyanosis, or edema; DP/PT/radial pulses 2+ bilaterally MS- no significant deformity or atrophy Skin- warm and dry, no rash or lesion Psych- euthymic mood, full affect Neuro- strength and sensation are intact  EKG is not ordered today. Personal review of EKG from 05/03/2019 shows NSR at 85 bpm.   Assessment and Plan:  1. Atrial tachycardia/palpitations  2. OSA ***  3. H/o syncope ***  Shirley Friar, PA-C  07/09/19 12:13 PM

## 2019-07-12 ENCOUNTER — Ambulatory Visit: Payer: BC Managed Care – PPO | Admitting: Student

## 2019-07-13 NOTE — Progress Notes (Signed)
PCP:  Kathyrn Lass, MD Primary Cardiologist: No primary care provider on file. Electrophysiologist: None   Rebecca Marshall is a 68 y.o. female with past medical history of atrial tachycardia, reported bradycardia, neurally mediated syncope, and OSA on CPAP who presents today for routine electrophysiology followup. They are seen for Dr. Caryl Comes.   Since last being seen in our clinic, the patient reports doing well overall.  She had palpitations last Tuesday, that lasted until Friday afternoon. She was more fatigued and SOB at times. She had the same feeling this Tuesday, but it only lasted for part of the day. She denies any inciting factors. No caffeine or ETOH use. No high stress or recent illness.  She has an EKG function on her apple watch, but didn't think to use it for this.   She has not missed any of her medications. She denies CP, orthopnea, PND, or peripheral edema.   The patient feels that she is tolerating medications without difficulties and is otherwise without complaint today.   DATE PR interval QRSduration Dose  7/16 21 118 100  3/17 17 109 150  12/18 22 110 150  10/19 208 108 150  8/20 184 98 150     Past Medical History:  Diagnosis Date  . Acne nodule 11/26/2011  . ADENOCARCINOMA, THYROID GLAND, PAPILLARY 05/17/2009   04/2009 Two surgeries for thyroid cancer Path report from last surgery :THYROID, LEFT, HEMITHYROIDECTOMY: - MULTIPLE FOCI OF FOLLICULAR VARIANT OR PAPILLARY THYROID CARCINOMA, 1.7 CM IN GREATEST DIMENSION, CONFINED WITHIN THE THYROID TISSUE. - ANGIOLYMPHATIC INVASION PRESENT. - ISTHMUS RESECTION MARGIN INVOLVED BY TUMOR. - FOLLICULAR ADENOMA(S) WITH ASSOCIATED LYMPHOCYTIC THYROIDITIS.     . AK (actinic keratosis) 04/16/2012  . ALLERGIC RHINITIS 01/30/2009   Qualifier: Diagnosis of  By: Andria Frames MD, Gwyndolyn Saxon    . Anxiety   . ANXIETY DEPRESSION 09/03/2010   Long standing depression with anxiety. Good response to medications initially. Has been to [facility].    Marland Kitchen  Arthritis   . 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 Had revision by Dr Berenice Primas in 2011 with  ASR .Marland Kitchen    Marland Kitchen Atrial dysrhythmia    FOLLOWED BY DR Caryl Comes WAS HAVING PALPITATIONS AND SYNCOPAL EPISODES ONSET 9 YEARS AGO ; STARTED ON FLECAIINIDE , PER PATIENT TOLERATES WELL, LAST SYNCOPAL EPISODE WAS OVER 5 YEARS AGO ; SEE LOV IN  EPIC   . Atrial tachycardia ([place]) 06/05/2011   \Treated with flecainide-2011 2010-QRS duration 88 ms 2011 QRS duration 88 ms 2014 QRS duration 98 ms 2014 QRS 120mg    120 ms    . Bilateral bunions 12/18/2010  . Bilateral knee pain 06/29/2015  . Cancer [facility])    thyroid-status post resection on replacement Dr. Chalmers Cater  . Clinical depression 03/06/2016  . Cognitive changes 10/12/2014  . Complication of anesthesia    FENTANYL CAUSED ANAPHYLAXIS DURING CA COLONOSCOPY  . Depression   . Dysrhythmia    Dr Caryl Comes monitoring patient, atrial tachycardia under control, cardiac clearance 09/10/17  . Family history of diabetes mellitus (DM) 05/11/2014  . Fibromyalgia   . Gait abnormality 12/18/2010  . GE reflux   . GERD 07/14/2007   Qualifier: Diagnosis of  By: Nori Riis MD, Clarise Cruz    . Headache    migraines  . Headache upon awakening 06/05/2011  . Herniated lumbar disc without myelopathy 12/10/2013   Had surgery by neurosurgery in December, 2014.L4-5 laminotomy and microdiscectomy Dr Margreta Journey Done  at [facility] specialty surgical center   . Hip pain, chronic 04/05/2015   Status post THR plus revision.   . Hypoxemia 01/30/2015  . Memory change 05/12/2014   Reports problems with short-term memory, focusing, concentration in the last 6-12 months. Problems were significant enough that she had to quit her part-time job is fairly distressed about this.   . Metatarsalgia 12/18/2010  . Nausea & vomiting 06/05/2011  . Nocturia more than twice per night 01/30/2015  . Nonallopathic lesion of cervical region 11/26/2011  . Nonallopathic  lesion of lumbosacral region 11/26/2011  . OSA on CPAP 01/30/2015  . Osteoporosis   . PONV (postoperative nausea and vomiting)    did not have nausea/vomiting with nissen fundoplication done [id number] at [facility]  . Poor compliance with CPAP treatment 03/24/2017  . Rash and nonspecific skin eruption 01/09/2016    located in the 10 o'clock position on Right breast    . Severe obesity (BMI >= 40) ([place]) 01/30/2015  . Sleep apnea 10/2015   x 1 year, CPAP  . Snorings 01/30/2015  . Status post Nissen fundoplication [id number]  . Subacute cough 03/27/2012  . Thyroid disease   . Well adult health check 06/05/2011   Past Surgical History:  Procedure Laterality Date  . ABDOMINAL HYSTERECTOMY    . BREAST EXCISIONAL BIOPSY Right 25+ yrs ago   benign  . caroitid dopplers Bilateral 11/2015   normal  . CATARACT EXTRACTION Bilateral 09/05/15, 09/12/15  . CHOLECYSTECTOMY    . COLONOSCOPY  04/2011  . ESOPHAGEAL MANOMETRY N/A 03/31/2017   Procedure: ESOPHAGEAL MANOMETRY (EM);  Surgeon: Ronald Lobo, MD;  [facility];  Service: Endoscopy;  Laterality: N/A;  . HIATAL HERNIA REPAIR N/A 06/05/2018   Procedure: LAPAROSCOPIC  ENTERO-LYSIS  ERAS PATHWAY;  Surgeon: Johnathan Hausen, MD;  [facility];  Service: General;  Laterality: N/A;  . LAPAROSCOPIC NISSEN FUNDOPLICATION N/A [id number]   Procedure: LAPAROSCOPIC NISSEN FUNDOPLICATION;  Surgeon: Johnathan Hausen, MD;  [facility];  Service: General;  Laterality: N/A;  . THYROIDECTOMY  04/2009   papillary carcimao Dr. Erik Obey  . TOE SURGERY     bone spur removed - rt great toe  . TOTAL HIP ARTHROPLASTY  2009 and 2011   L hip   . TOTAL HIP ARTHROPLASTY Right 09/12/2017   Procedure: TOTAL HIP ARTHROPLASTY ANTERIOR APPROACH;  Surgeon: Dorna Leitz, MD;  Location: [place];  Service: Orthopedics;  Laterality: Right;  . TUBAL LIGATION    . UPPER GI ENDOSCOPY  06/05/2018   Procedure: UPPER GI ENDOSCOPY;  Surgeon: Johnathan Hausen, MD;  [facility];   Service: General;;    Current Outpatient Medications  Medication Sig Dispense Refill  . acetaminophen (TYLENOL) 500 MG tablet Take 1,000 mg by mouth every 8 (eight) hours as needed for mild pain or moderate pain.     . cyclobenzaprine (FLEXERIL) 10 MG tablet Take 10 mg by mouth 2 (two) times daily.    Marland Kitchen eletriptan (RELPAX) 40 MG tablet Take by mouth one at the beginning of headache and may repeat once in 2  Hours for maximum of 2 tabs in 24hours 27 tablet 3  . flecainide (TAMBOCOR) 150 MG tablet TAKE 1 TABLET TWICE A DAY 180 tablet 0  . levothyroxine (SYNTHROID) 150 MCG tablet Take 150 mcg by mouth daily before breakfast.    . Melatonin 10 MG TABS Take 10 mg by mouth at bedtime as needed (for sleep.).    Marland Kitchen ondansetron (ZOFRAN ODT) 4 MG disintegrating tablet 4mg   ODT q4 hours prn nausea/vomit 30 tablet 1  . sertraline (ZOLOFT) 100 MG tablet Take 200 mg by mouth daily.    . traZODone (DESYREL) 100 MG tablet Take 300 mg by mouth at bedtime.     No current facility-administered medications for this visit.     Allergies  Allergen Reactions  . Nsaids Other (See Comments)    Rectal bleeding  . Codeine Nausea And Vomiting    Social History   Socioeconomic History  . Marital status: Married    Spouse name: [name]  . Number of children: 3  . Years of education: 12+  . Highest education level: Not on file  Occupational History  . Occupation: unemployed   Social Needs  . Financial resource strain: Not on file  . Food insecurity    Worry: Not on file    Inability: Not on file  . Transportation needs    Medical: Not on file    Non-medical: Not on file  Tobacco Use  . Smoking status: Never Smoker  . Smokeless tobacco: Never Used  Substance and Sexual Activity  . Alcohol use: No    Alcohol/week: 0.0 standard drinks    Comment: none  . Drug use: No  . Sexual activity: Not on file  Lifestyle  . Physical activity    Days per week: Not on file    Minutes per session: Not on file  .  Stress: Not on file  Relationships  . Social Herbalist on phone: Not on file    Gets together: Not on file    Attends religious service: Not on file    Active member of club or organization: Not on file    Attends meetings of clubs or organizations: Not on file    Relationship status: Not on file  . Intimate partner violence    Fear of current or ex partner: Not on file    Emotionally abused: Not on file    Physically abused: Not on file    Forced sexual activity: Not on file  Other Topics Concern  . Not on file  Social History Narrative   Patient lives at home alone    Patient is married    Patient is not working    Patient has 3 children    Patient has her BSN RN   Patient is right handed       Daily caffeine use 1 cup daily.    Review of Systems: General: No chills, fever, night sweats or weight changes  Cardiovascular:  No chest pain, dyspnea on exertion, edema, orthopnea, paroxysmal nocturnal dyspnea Dermatological: No rash, lesions or masses Respiratory: No cough, dyspnea Urologic: No hematuria, dysuria Abdominal: No nausea, vomiting, diarrhea, bright red blood per rectum, melena, or hematemesis Neurologic: No visual changes, weakness, changes in mental status All other systems reviewed and are otherwise negative except as noted above.  Physical Exam: Vitals:   07/15/19 0832  BP: 116/78  Pulse: 77  SpO2: 95%  Weight: 197 lb (89.4 kg)  Height: 5' 6.5" (1.689 m)   GEN- The patient is well appearing, alert and oriented x 3 today.   HEENT: normocephalic, atraumatic; sclera clear, conjunctiva pink; hearing intact; oropharynx clear; neck supple, no JVP Lymph- no cervical lymphadenopathy Lungs- Clear to ausculation bilaterally, normal work of breathing.  No wheezes, rales, rhonchi Heart- Regular rate and rhythm, no murmurs, rubs or gallops, PMI not laterally displaced GI- soft, non-tender, non-distended, bowel sounds present, no hepatosplenomegaly  Extremities-  no clubbing, cyanosis, or edema; DP/PT/radial pulses 2+ bilaterally MS- no significant deformity or atrophy Skin- warm and dry, no rash or lesion Psych- euthymic mood, full affect Neuro- strength and sensation are intact  EKG is not ordered. Personal review of EKG from 05/03/2019 shows NSR at 85 bpm, QRS 98 ms, PR 184 ms. (This is stable from 06/2018, so not re-done today)  Assessment and Plan:  1. Palpitations/Atrial tachycardia Continue flecainide 150 mg BID Echo 11/2015 LVEF 60-65% with LA 35 mm She had an extended episode last week, and a shorter episode this week. I suggested [employer], which she is not interested in at this time.   Prior to last week she hadn't had palpitations in many months, so she is hesitant to undergo monitoring at this time, with what she thinks would be a poor predictive value. Discussed LOOP which I think could be reasonable for her.  At this point, will check labwork and repeat Echo. Will discuss with Dr. Caryl Comes what he thinks would most benefit her at this point, watchful waiting, short term monitoring, or "chronic" monitoring via LOOP recorder.   2. OSA - treated Encouraged CPAP use.  3. Hypothyroidism S/p surgery for thyroid cancer She had labs checked earlier this month and states her levels were stable.   Labs today. Will plan to see back in 3 months to further discuss symptoms. Pt to call back with any further sustained palpitations.   Shirley Friar, PA-C  07/15/19 8:38 AM

## 2019-07-15 ENCOUNTER — Ambulatory Visit (INDEPENDENT_AMBULATORY_CARE_PROVIDER_SITE_OTHER): Payer: BC Managed Care – PPO | Admitting: Student

## 2019-07-15 ENCOUNTER — Other Ambulatory Visit: Payer: Self-pay

## 2019-07-15 VITALS — BP 116/78 | HR 77 | Ht 66.5 in | Wt 197.0 lb

## 2019-07-15 DIAGNOSIS — R002 Palpitations: Secondary | ICD-10-CM | POA: Diagnosis not present

## 2019-07-15 LAB — COMPREHENSIVE METABOLIC PANEL
ALT: 13 IU/L (ref 0–32)
AST: 18 IU/L (ref 0–40)
Albumin/Globulin Ratio: 1.7 (ref 1.2–2.2)
Albumin: 4.2 g/dL (ref 3.8–4.8)
Alkaline Phosphatase: 79 IU/L (ref 39–117)
BUN/Creatinine Ratio: 14 (ref 12–28)
BUN: 10 mg/dL (ref 8–27)
Bilirubin Total: 0.4 mg/dL (ref 0.0–1.2)
CO2: 26 mmol/L (ref 20–29)
Calcium: 9.2 mg/dL (ref 8.7–10.3)
Chloride: 102 mmol/L (ref 96–106)
Creatinine, Ser: 0.73 mg/dL (ref 0.57–1.00)
GFR calc Af Amer: 99 mL/min/{1.73_m2} (ref >59)
GFR calc non Af Amer: 86 mL/min/{1.73_m2} (ref >59)
Globulin, Total: 2.5 g/dL (ref 1.5–4.5)
Glucose: 95 mg/dL (ref 65–99)
Potassium: 4 mmol/L (ref 3.5–5.2)
Sodium: 141 mmol/L (ref 134–144)
Total Protein: 6.7 g/dL (ref 6.0–8.5)

## 2019-07-15 LAB — MAGNESIUM: Magnesium: 2.2 mg/dL (ref 1.6–2.3)

## 2019-07-15 MED ORDER — FLECAINIDE ACETATE 150 MG PO TABS: 150.0000 mg | ORAL_TABLET | Freq: Two times a day (BID) | ORAL | 2 refills | Status: DC

## 2019-07-15 NOTE — Patient Instructions (Signed)
Medication Instructions:   Your physician recommends that you continue on your current medications as directed. Please refer to the Current Medication list given to you today.  *If you need a refill on your cardiac medications before your next appointment, please call your pharmacy*  Lab Work:  [facility] AND [place]   If you have labs (blood work) drawn today and your tests are completely normal, you will receive your results only by: Marland Kitchen MyChart Message (if you have MyChart) OR . A paper copy in the mail If you have any lab test that is abnormal or we need to change your treatment, we will call you to review the results.  Testing/Procedures: Your physician has requested that you have an echocardiogram. Echocardiography is a painless test that uses sound waves to create images of your heart. It provides your doctor with information about the size and shape of your heart and how well your heart's chambers and valves are working. This procedure takes approximately one hour. There are no restrictions for this procedure.    Follow-Up: At [facility], you and your health needs are our priority.  As part of our continuing mission to provide you with exceptional heart care, we have created designated Provider Care Teams.  These Care Teams include your primary Cardiologist (physician) and Advanced Practice Providers (APPs -  Physician Assistants and Nurse Practitioners) who all work together to provide you with the care you need, when you need it.  Your next appointment:   3 months  The format for your next appointment:   In Person  Provider:    You may see Legrand Como "Oda Kilts, PA-C   Other Instructions

## 2019-07-20 ENCOUNTER — Other Ambulatory Visit (HOSPITAL_COMMUNITY): Payer: BC Managed Care – PPO

## 2019-07-22 ENCOUNTER — Other Ambulatory Visit (HOSPITAL_COMMUNITY): Payer: BC Managed Care – PPO

## 2019-07-24 DIAGNOSIS — K449 Diaphragmatic hernia without obstruction or gangrene: Secondary | ICD-10-CM | POA: Diagnosis not present

## 2019-07-26 ENCOUNTER — Other Ambulatory Visit: Payer: Self-pay | Admitting: Family Medicine

## 2019-07-26 DIAGNOSIS — Z1231 Encounter for screening mammogram for malignant neoplasm of breast: Secondary | ICD-10-CM

## 2019-08-05 DIAGNOSIS — Z6981 Encounter for mental health services for victim of other abuse: Secondary | ICD-10-CM | POA: Diagnosis not present

## 2019-08-05 DIAGNOSIS — Z62811 Personal history of psychological abuse in childhood: Secondary | ICD-10-CM | POA: Diagnosis not present

## 2019-08-05 DIAGNOSIS — F331 Major depressive disorder, recurrent, moderate: Secondary | ICD-10-CM | POA: Diagnosis not present

## 2019-08-05 DIAGNOSIS — F4322 Adjustment disorder with anxiety: Secondary | ICD-10-CM | POA: Diagnosis not present

## 2019-08-23 DIAGNOSIS — K449 Diaphragmatic hernia without obstruction or gangrene: Secondary | ICD-10-CM | POA: Diagnosis not present

## 2019-08-26 DIAGNOSIS — H353131 Nonexudative age-related macular degeneration, bilateral, early dry stage: Secondary | ICD-10-CM | POA: Diagnosis not present

## 2019-08-31 DIAGNOSIS — F4322 Adjustment disorder with anxiety: Secondary | ICD-10-CM | POA: Diagnosis not present

## 2019-08-31 DIAGNOSIS — Z62811 Personal history of psychological abuse in childhood: Secondary | ICD-10-CM | POA: Diagnosis not present

## 2019-08-31 DIAGNOSIS — D649 Anemia, unspecified: Secondary | ICD-10-CM | POA: Diagnosis not present

## 2019-08-31 DIAGNOSIS — Z6981 Encounter for mental health services for victim of other abuse: Secondary | ICD-10-CM | POA: Diagnosis not present

## 2019-08-31 DIAGNOSIS — G43009 Migraine without aura, not intractable, without status migrainosus: Secondary | ICD-10-CM | POA: Diagnosis not present

## 2019-08-31 DIAGNOSIS — G44209 Tension-type headache, unspecified, not intractable: Secondary | ICD-10-CM | POA: Diagnosis not present

## 2019-08-31 DIAGNOSIS — F5101 Primary insomnia: Secondary | ICD-10-CM | POA: Diagnosis not present

## 2019-08-31 DIAGNOSIS — F331 Major depressive disorder, recurrent, moderate: Secondary | ICD-10-CM | POA: Diagnosis not present

## 2019-09-01 DIAGNOSIS — R633 Feeding difficulties: Secondary | ICD-10-CM | POA: Diagnosis not present

## 2019-09-01 DIAGNOSIS — R634 Abnormal weight loss: Secondary | ICD-10-CM | POA: Diagnosis not present

## 2019-09-01 DIAGNOSIS — R1013 Epigastric pain: Secondary | ICD-10-CM | POA: Diagnosis not present

## 2019-09-01 DIAGNOSIS — R1312 Dysphagia, oropharyngeal phase: Secondary | ICD-10-CM | POA: Diagnosis not present

## 2019-09-02 DIAGNOSIS — Z23 Encounter for immunization: Secondary | ICD-10-CM | POA: Diagnosis not present

## 2019-09-15 ENCOUNTER — Ambulatory Visit
Admission: RE | Admit: 2019-09-15 | Discharge: 2019-09-15 | Disposition: A | Discharge status: Home / Self Care | Payer: BC Managed Care – PPO | Source: Ambulatory Visit | Attending: Family Medicine | Admitting: Family Medicine

## 2019-09-15 ENCOUNTER — Other Ambulatory Visit: Payer: Self-pay

## 2019-09-15 DIAGNOSIS — Z1231 Encounter for screening mammogram for malignant neoplasm of breast: Secondary | ICD-10-CM

## 2019-09-29 DIAGNOSIS — Z79899 Other long term (current) drug therapy: Secondary | ICD-10-CM | POA: Diagnosis not present

## 2019-09-29 DIAGNOSIS — Z6830 Body mass index (BMI) 30.0-30.9, adult: Secondary | ICD-10-CM | POA: Diagnosis not present

## 2019-09-29 DIAGNOSIS — L918 Other hypertrophic disorders of the skin: Secondary | ICD-10-CM | POA: Diagnosis not present

## 2019-09-29 DIAGNOSIS — K911 Postgastric surgery syndromes: Secondary | ICD-10-CM | POA: Diagnosis not present

## 2019-09-29 DIAGNOSIS — L720 Epidermal cyst: Secondary | ICD-10-CM | POA: Diagnosis not present

## 2019-09-29 DIAGNOSIS — D649 Anemia, unspecified: Secondary | ICD-10-CM | POA: Diagnosis not present

## 2019-09-29 DIAGNOSIS — R634 Abnormal weight loss: Secondary | ICD-10-CM | POA: Diagnosis not present

## 2019-10-24 DIAGNOSIS — K449 Diaphragmatic hernia without obstruction or gangrene: Secondary | ICD-10-CM | POA: Diagnosis not present

## 2019-10-28 DIAGNOSIS — F4322 Adjustment disorder with anxiety: Secondary | ICD-10-CM | POA: Diagnosis not present

## 2019-11-10 DIAGNOSIS — M19041 Primary osteoarthritis, right hand: Secondary | ICD-10-CM | POA: Diagnosis not present

## 2019-11-10 DIAGNOSIS — M6740 Ganglion, unspecified site: Secondary | ICD-10-CM | POA: Diagnosis not present

## 2019-11-10 DIAGNOSIS — R2231 Localized swelling, mass and lump, right upper limb: Secondary | ICD-10-CM | POA: Diagnosis not present

## 2019-11-12 ENCOUNTER — Other Ambulatory Visit: Payer: Self-pay | Admitting: Orthopedic Surgery

## 2019-11-12 ENCOUNTER — Telehealth: Payer: Self-pay | Admitting: *Deleted

## 2019-11-12 NOTE — Telephone Encounter (Signed)
   [facility] Pre-operative Risk Assessment    Request for surgical clearance:  1. What type of surgery is being performed?  RIGHT LONG FINGER EXCISION CYST WITH DEBRIDEMENT DISTAL INTERPHALANGEAL JOINT   2. When is this surgery scheduled?  01/20/2020   3. What type of clearance is required (medical clearance vs. Pharmacy clearance to hold med vs. Both)?  MEDICAL  4. Are there any medications that need to be held prior to surgery and how long? N/A   5. Practice name and name of physician performing surgery?  [facility]   6. What is your office phone number [phone]    7.   What is your office fax number [phone]  8.   Anesthesia type (None, local, MAC, general) ?  LEFT MESSAGE FOR SURGEON'S OFFICE TO CALL BACK WITH INFORMATION    Jeanann Lewandowsky 11/12/2019, 12:53 PM  _________________________________________________________________   (provider comments below)

## 2019-11-12 NOTE — Telephone Encounter (Signed)
Alisha called back from Dr. Levell July office called back and stated that it would be CHOICE anethesia

## 2019-11-15 NOTE — Telephone Encounter (Signed)
Pt has decided against surgery.  She will notify Dr. Fredna Dow.

## 2019-11-19 ENCOUNTER — Ambulatory Visit: Payer: PPO | Attending: Internal Medicine

## 2019-11-19 DIAGNOSIS — Z23 Encounter for immunization: Secondary | ICD-10-CM

## 2019-11-19 NOTE — Progress Notes (Signed)
   Covid-19 Vaccination Clinic  Name:  Novella Hubley    MRN: [id number] DOB: 1950-10-02  11/19/2019  Ms. Ensor was observed post Covid-19 immunization for 15 minutes without incidence. She was provided with Vaccine Information Sheet and instruction to access the V-Safe system.   Ms. Vandoorn was instructed to call 911 with any severe reactions post vaccine: Marland Kitchen Difficulty breathing  . Swelling of your face and throat  . A fast heartbeat  . A bad rash all over your body  . Dizziness and weakness    Immunizations Administered    Name Date Dose VIS Date Route   Pfizer COVID-19 Vaccine 11/19/2019  8:32 AM 0.3 mL 09/03/2019 Intramuscular   Manufacturer: [place]   Lot: [id number]   [place]: [id number]

## 2019-11-21 DIAGNOSIS — K449 Diaphragmatic hernia without obstruction or gangrene: Secondary | ICD-10-CM | POA: Diagnosis not present

## 2019-11-23 DIAGNOSIS — F4322 Adjustment disorder with anxiety: Secondary | ICD-10-CM | POA: Diagnosis not present

## 2019-12-05 ENCOUNTER — Emergency Department (HOSPITAL_COMMUNITY): Payer: BC Managed Care – PPO

## 2019-12-05 ENCOUNTER — Encounter (HOSPITAL_COMMUNITY): Payer: Self-pay | Admitting: Emergency Medicine

## 2019-12-05 ENCOUNTER — Emergency Department (HOSPITAL_COMMUNITY)
Admission: EM | Admit: 2019-12-05 | Discharge: 2019-12-05 | Disposition: A | Discharge status: Home / Self Care | Payer: BC Managed Care – PPO | Attending: Emergency Medicine | Admitting: Emergency Medicine

## 2019-12-05 ENCOUNTER — Other Ambulatory Visit: Payer: Self-pay

## 2019-12-05 DIAGNOSIS — R079 Chest pain, unspecified: Secondary | ICD-10-CM

## 2019-12-05 DIAGNOSIS — R0789 Other chest pain: Secondary | ICD-10-CM | POA: Insufficient documentation

## 2019-12-05 DIAGNOSIS — Z79899 Other long term (current) drug therapy: Secondary | ICD-10-CM | POA: Diagnosis not present

## 2019-12-05 DIAGNOSIS — Z96643 Presence of artificial hip joint, bilateral: Secondary | ICD-10-CM | POA: Diagnosis not present

## 2019-12-05 LAB — BASIC METABOLIC PANEL
Anion gap: 8 (ref 5–15)
BUN: 18 mg/dL (ref 8–23)
CO2: 26 mmol/L (ref 22–32)
Calcium: 8.5 mg/dL — ABNORMAL LOW (ref 8.9–10.3)
Chloride: 106 mmol/L (ref 98–111)
Creatinine, Ser: 0.64 mg/dL (ref 0.44–1.00)
GFR calc Af Amer: >60 mL/min (ref >60)
GFR calc non Af Amer: >60 mL/min (ref >60)
Glucose, Bld: 102 mg/dL — ABNORMAL HIGH (ref 70–99)
Potassium: 3.3 mmol/L — ABNORMAL LOW (ref 3.5–5.1)
Sodium: 140 mmol/L (ref 135–145)

## 2019-12-05 LAB — CBC
HCT: 39.5 % (ref 36.0–46.0)
Hemoglobin: 12.4 g/dL (ref 12.0–15.0)
MCH: 29.5 pg (ref 26.0–34.0)
MCHC: 31.4 g/dL (ref 30.0–36.0)
MCV: 94 fL (ref 80.0–100.0)
Platelets: 210 10*3/uL (ref 150–400)
RBC: 4.2 MIL/uL (ref 3.87–5.11)
RDW: 14.4 % (ref 11.5–15.5)
WBC: 5.2 10*3/uL (ref 4.0–10.5)
nRBC: 0 % (ref 0.0–0.2)

## 2019-12-05 LAB — TROPONIN I (HIGH SENSITIVITY)
Troponin I (High Sensitivity): 3 ng/L (ref <18)
Troponin I (High Sensitivity): 3 ng/L (ref <18)

## 2019-12-05 NOTE — Discharge Instructions (Addendum)
Follow up with your cardiologist.  Return to the ED if you have any recurrent symptoms

## 2019-12-05 NOTE — ED Triage Notes (Signed)
Per pt, states CP radiating to left neck and arm-started at 8 am-states symptoms subsided but she decided to come get checked out anyway-history of palpations

## 2019-12-05 NOTE — ED Provider Notes (Signed)
[facility] DEPT Provider Note   CSN: [id number] Arrival date & time: 12/05/19  1240     History Chief Complaint  Patient presents with  . Chest Pain    Rebecca Marshall is a 69 y.o. female.  HPI  HPI: A 69 year old patient presents for evaluation of chest pain. Initial onset of pain was approximately 1-3 hours ago. The patient's chest pain is described as heaviness/pressure/tightness and is worse with exertion. The patient complains of nausea. The patient's chest pain is middle- or left-sided, is not well-localized, is not sharp and does radiate to the arms/jaw/neck. The patient denies diaphoresis. The patient has no history of stroke, has no history of peripheral artery disease, has not smoked in the past 90 days, denies any history of treated diabetes, has no relevant family history of coronary artery disease (first degree relative at less than age 46), is not hypertensive, has no history of hypercholesterolemia and does not have an elevated BMI (>=30). Pt had sudden onset of chest pain this am while gardening.  Started at [place].  She had to rest and it started to improve.  Pt then had another episode at around 11am.  She has had sx like this in the past.  She has had a cardiac workup that has been negative.  She sees Dr Caryl Comes.  Feeling much better now. She has been having trouble with chronic dizziness ever since she had a complicated surgery for a hiatal hernia about a year ago.  As a result she has dumping syndrome.  She tries to drink fluids.  Sx get worse from sitting to lying position. Past Medical History:  Diagnosis Date  . Acne nodule 11/26/2011  . ADENOCARCINOMA, THYROID GLAND, PAPILLARY 05/17/2009   04/2009 Two surgeries for thyroid cancer Path report from last surgery :THYROID, LEFT, HEMITHYROIDECTOMY: - MULTIPLE FOCI OF FOLLICULAR VARIANT OR PAPILLARY THYROID CARCINOMA, 1.7 CM IN GREATEST DIMENSION, CONFINED WITHIN THE THYROID TISSUE. - ANGIOLYMPHATIC  INVASION PRESENT. - ISTHMUS RESECTION MARGIN INVOLVED BY TUMOR. - FOLLICULAR ADENOMA(S) WITH ASSOCIATED LYMPHOCYTIC THYROIDITIS.     . AK (actinic keratosis) 04/16/2012  . ALLERGIC RHINITIS 01/30/2009   Qualifier: Diagnosis of  By: Andria Frames MD, Gwyndolyn Saxon    . Anxiety   . ANXIETY DEPRESSION 09/03/2010   Long standing depression with anxiety. Good response to medications initially. Has been to [facility].    Marland Kitchen Arthritis   . 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 08/11/2007   LEFT [id number] hip replacement (Dr Berenice Primas) with recalled hardware. Had revision by Dr Berenice Primas in 2011 with  ASR .Marland Kitchen    Marland Kitchen Atrial dysrhythmia    FOLLOWED BY DR Caryl Comes WAS HAVING PALPITATIONS AND SYNCOPAL EPISODES ONSET 9 YEARS AGO ; STARTED ON FLECAIINIDE , PER PATIENT TOLERATES WELL, LAST SYNCOPAL EPISODE WAS OVER 5 YEARS AGO ; SEE LOV IN  EPIC   . Atrial tachycardia ([place]) 06/05/2011   \Treated with flecainide-2011 2010-QRS duration 88 ms 2011 QRS duration 88 ms 2014 QRS duration 98 ms 2014 QRS 120mg    120 ms    . Bilateral bunions 12/18/2010  . Bilateral knee pain 06/29/2015  . Cancer [facility])    thyroid-status post resection on replacement Dr. Chalmers Cater  . Clinical depression 03/06/2016  . Cognitive changes 10/12/2014  . Complication of anesthesia    FENTANYL CAUSED ANAPHYLAXIS DURING CA COLONOSCOPY  . Depression   . Dysrhythmia    Dr Caryl Comes monitoring patient, atrial tachycardia under control, cardiac clearance 09/10/17  . Family history of diabetes  mellitus (DM) 05/11/2014  . Fibromyalgia   . Gait abnormality 12/18/2010  . GE reflux   . GERD 07/14/2007   Qualifier: Diagnosis of  By: Nori Riis MD, Clarise Cruz    . Headache    migraines  . Headache upon awakening 06/05/2011  . Herniated lumbar disc without myelopathy 12/10/2013   Had surgery by neurosurgery in December, 2014.L4-5 laminotomy and microdiscectomy Dr Margreta Journey Done at [facility]   . Hip pain, chronic 04/05/2015   Status post THR plus revision.   . Hypoxemia 01/30/2015  .  Memory change 05/12/2014   Reports problems with short-term memory, focusing, concentration in the last 6-12 months. Problems were significant enough that she had to quit her part-time job is fairly distressed about this.   . Metatarsalgia 12/18/2010  . Nausea & vomiting 06/05/2011  . Nocturia more than twice per night 01/30/2015  . Nonallopathic lesion of cervical region 11/26/2011  . Nonallopathic lesion of lumbosacral region 11/26/2011  . OSA on CPAP 01/30/2015  . Osteoporosis   . PONV (postoperative nausea and vomiting)    did not have nausea/vomiting with nissen fundoplication done [id number] at [facility]  . Poor compliance with CPAP treatment 03/24/2017  . Rash and nonspecific skin eruption 01/09/2016    located in the 10 o'clock position on Right breast    . Severe obesity (BMI >= 40) ([place]) 01/30/2015  . Sleep apnea 10/2015   x 1 year, CPAP  . Snorings 01/30/2015  . Status post Nissen fundoplication [id number]  . Subacute cough 03/27/2012  . Thyroid disease   . Well adult health check 06/05/2011    Patient Active Problem List   Diagnosis Date Noted  . Paraesophageal hernia 01/29/2019  . Hiatal hernia with GERD 06/05/2018  . Absolute anemia 05/12/2018  . Episodic cluster headache, not intractable 05/12/2018  . Morbid obesity ([place]) 05/12/2018  . Chest pain due to GERD 05/12/2018  . Iron deficiency anemia 04/07/2018  . Primary osteoarthritis of right hip 09/12/2017  . Status post Nissen fundoplication [id number]  . Poor compliance with CPAP treatment 03/24/2017  . Dysphonia 02/05/2017  . Depression headache 03/06/2016  . Clinical depression 03/06/2016  . Rash and nonspecific skin eruption 01/09/2016  . Bilateral knee pain 06/29/2015  . Hip pain, chronic 04/05/2015  . OSA on CPAP 01/30/2015  . Snorings 01/30/2015  . Severe obesity (BMI >= 40) ([place]) 01/30/2015  . Nocturia more than twice per night 01/30/2015  . Hypoxemia 01/30/2015  . OSA (obstructive sleep apnea) 11/01/2014  . Cognitive  changes 10/12/2014  . Memory change 05/12/2014  . Family history of diabetes mellitus (DM) 05/11/2014  . Herniated lumbar disc without myelopathy 12/10/2013  . AK (actinic keratosis) 04/16/2012  . Atrial tachycardia ([place]) 06/05/2011  . Bilateral bunions 12/18/2010  . Metatarsalgia 12/18/2010  . ANXIETY DEPRESSION 09/03/2010  . ADENOCARCINOMA, THYROID GLAND, PAPILLARY 05/17/2009  . ALLERGIC RHINITIS 01/30/2009  . 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 08/11/2007  . GERD 07/14/2007  . FIBROMYALGIA 07/14/2007    Past Surgical History:  Procedure Laterality Date  . ABDOMINAL HYSTERECTOMY    . BREAST EXCISIONAL BIOPSY Right 25+ yrs ago   benign  . caroitid dopplers Bilateral 11/2015   normal  . CATARACT EXTRACTION Bilateral 09/05/15, 09/12/15  . CHOLECYSTECTOMY    . COLONOSCOPY  04/2011  . ESOPHAGEAL MANOMETRY N/A 03/31/2017   Procedure: ESOPHAGEAL MANOMETRY (EM);  Surgeon: Ronald Lobo, MD;  Location: [facility];  Service: Endoscopy;  Laterality: N/A;  . HIATAL HERNIA REPAIR  N/A 06/05/2018   Procedure: LAPAROSCOPIC  ENTERO-LYSIS  ERAS PATHWAY;  Surgeon: Johnathan Hausen, MD;  Location: [facility];  Service: General;  Laterality: N/A;  . LAPAROSCOPIC NISSEN FUNDOPLICATION N/A [id number]   Procedure: LAPAROSCOPIC NISSEN FUNDOPLICATION;  Surgeon: Johnathan Hausen, MD;  Location: [facility];  Service: General;  Laterality: N/A;  . THYROIDECTOMY  04/2009   papillary [place] Dr. Erik Obey  . TOE SURGERY     bone spur removed - rt great toe  . TOTAL HIP ARTHROPLASTY  2009 and 2011   L hip   . TOTAL HIP ARTHROPLASTY Right 09/12/2017   Procedure: TOTAL HIP ARTHROPLASTY ANTERIOR APPROACH;  Surgeon: Dorna Leitz, MD;  Location: [facility];  Service: Orthopedics;  Laterality: Right;  . TUBAL LIGATION    . UPPER GI ENDOSCOPY  06/05/2018   Procedure: UPPER GI ENDOSCOPY;  Surgeon: Johnathan Hausen, MD;  Location: [facility];  Service: General;;     OB History   No obstetric history on file.     Family  History  Problem Relation Age of Onset  . Cancer Other   . Alcohol abuse Mother   . Cancer Mother 58       Lung   . Alcohol abuse Father   . Breast cancer Sister     Social History   Tobacco Use  . Smoking status: Never Smoker  . Smokeless tobacco: Never Used  Substance Use Topics  . Alcohol use: No    Alcohol/week: 0.0 standard drinks    Comment: none  . Drug use: No    Home Medications Prior to Admission medications   Medication Sig Start Date End Date Taking? Authorizing Provider  acetaminophen (TYLENOL) 500 MG tablet Take 1,000 mg by mouth every 8 (eight) hours as needed for mild pain or moderate pain.     [provider]  cyclobenzaprine (FLEXERIL) 10 MG tablet Take 10 mg by mouth 2 (two) times daily.    [provider]  eletriptan (RELPAX) 40 MG tablet Take by mouth one at the beginning of headache and may repeat once in 2  Hours for maximum of 2 tabs in 24hours 04/05/15   Dickie La, MD  flecainide (TAMBOCOR) 150 MG tablet Take 1 tablet (150 mg total) by mouth 2 (two) times daily. 07/15/19   Shirley Friar, PA-C  levothyroxine (SYNTHROID) 150 MCG tablet Take 150 mcg by mouth daily before breakfast.    [provider]  Melatonin 10 MG TABS Take 10 mg by mouth at bedtime as needed (for sleep.).    [provider]  ondansetron (ZOFRAN ODT) 4 MG disintegrating tablet 4mg  ODT q4 hours prn nausea/vomit 06/28/17   Stark Klein, MD  sertraline (ZOLOFT) 100 MG tablet Take 200 mg by mouth daily.    [provider]  traZODone (DESYREL) 100 MG tablet Take 300 mg by mouth at bedtime.    [provider]    Allergies    Nsaids and Codeine  Review of Systems   Review of Systems  Constitutional: Negative for fever.  Cardiovascular: Positive for palpitations.  Neurological: Positive for dizziness. Negative for seizures, syncope and speech difficulty.    Physical Exam Updated Vital Signs BP (!) 151/78 (BP Location:  Left Arm)   Pulse 64   Temp 97.9 F (36.6 C) (Oral)   Resp 15   Ht 1.715 m (5' 7.5")   Wt 87.5 kg   SpO2 100%   BMI 29.78 kg/m   Physical Exam Vitals and nursing note reviewed.  Constitutional:      General: She is not in acute distress.    Appearance: She is well-developed.  HENT:     Head: Normocephalic and atraumatic.     Right Ear: External ear normal.     Left Ear: External ear normal.  Eyes:     General: No scleral icterus.       Right eye: No discharge.        Left eye: No discharge.     Conjunctiva/sclera: Conjunctivae normal.  Neck:     Trachea: No tracheal deviation.  Cardiovascular:     Rate and Rhythm: Normal rate and regular rhythm.  Pulmonary:     Effort: Pulmonary effort is normal. No respiratory distress.     Breath sounds: Normal breath sounds. No stridor. No wheezing or rales.  Abdominal:     General: Bowel sounds are normal. There is no distension.     Palpations: Abdomen is soft.     Tenderness: There is no abdominal tenderness. There is no guarding or rebound.  Musculoskeletal:        General: No tenderness.     Cervical back: Neck supple.  Skin:    General: Skin is warm and dry.     Findings: No rash.  Neurological:     Mental Status: She is alert.     Cranial Nerves: No cranial nerve deficit (no facial droop, extraocular movements intact, no slurred speech).     Sensory: No sensory deficit.     Motor: No abnormal muscle tone or seizure activity.     Coordination: Coordination normal.     ED Results / Procedures / Treatments   Labs (all labs ordered are listed, but only abnormal results are displayed) Labs Reviewed  BASIC METABOLIC PANEL - Abnormal; Notable for the following components:      Result Value   Potassium 3.3 (*)    Glucose, Bld 102 (*)    Calcium 8.5 (*)    All other components within normal limits  CBC  TROPONIN I (HIGH SENSITIVITY)  TROPONIN I (HIGH SENSITIVITY)    EKG EKG Interpretation  Date/Time:  Sunday December 05 2019 12:55:09 EDT Ventricular Rate:  68 PR Interval:    QRS Duration: 109 QT Interval:  445 QTC Calculation: 474 R Axis:   -33 Text Interpretation: Sinus rhythm Left axis deviation Low voltage, precordial leads Borderline T abnormalities, anterior leads No significant change since last tracing Confirmed by Dorie Rank [phone]) on 12/05/2019 1:06:16 PM   Radiology DG Chest 2 View  Result Date: 12/05/2019 CLINICAL DATA:  Acute chest pain EXAM: CHEST - 2 VIEW COMPARISON:  05/03/2019 and prior radiographs FINDINGS: The cardiomediastinal silhouette is unremarkable. Minimal LEFT basilar scarring again noted. There is no evidence of focal airspace disease, pulmonary edema, suspicious pulmonary nodule/mass, pleural effusion, or pneumothorax. No acute bony abnormalities are identified. IMPRESSION: No active cardiopulmonary disease. Electronically Signed   By: Margarette Canada M.D.   On: 12/05/2019 13:21    Procedures Procedures (including critical care time)  Medications Ordered in ED Medications - No data to display  ED Course  I have reviewed the triage vital signs and the nursing notes.  Pertinent labs & imaging results that were available during my care of the patient were reviewed by me and considered in my medical decision making (see chart for details).  Clinical Course as of Dec 05 1647  Sun Dec 05, 2019  1332 Chest x-ray normal  DG Chest 2 View [JK]  1443  Initial troponin labs unremarkable   [JK]    Clinical Course User Index [JK] Dorie Rank, MD   MDM Rules/Calculators/A&P HEAR Score: 5                    Pt is moderate risk heart score.   Sx have some concerning features.  Pt does not have a history of ACS.  Doubt PE, dissection.  She has remained asymptomatic in the ED.  Serial troponins negative.  Pt is feeling well and would like to go home.  Discussed importance of close cardiology follow up.  Return to ED for recurrent symptoms.   Final Clinical Impression(s) / ED  Diagnoses Final diagnoses:  Chest pain, unspecified type    Rx / DC Orders ED Discharge Orders    None       Dorie Rank, MD 12/05/19 1649

## 2019-12-14 ENCOUNTER — Ambulatory Visit: Payer: PPO | Attending: Internal Medicine

## 2019-12-14 DIAGNOSIS — Z23 Encounter for immunization: Secondary | ICD-10-CM

## 2019-12-14 NOTE — Progress Notes (Signed)
   Covid-19 Vaccination Clinic  Name:  Rhonda Prat    MRN: [id number] DOB: [birth year]  12/14/2019  Ms. Skelley was observed post Covid-19 immunization for 15 minutes without incident. She was provided with Vaccine Information Sheet and instruction to access the V-Safe system.   Ms. Victoria was instructed to call 911 with any severe reactions post vaccine: Marland Kitchen Difficulty breathing  . Swelling of face and throat  . A fast heartbeat  . A bad rash all over body  . Dizziness and weakness   Immunizations Administered    Name Date Dose VIS Date Route   Pfizer COVID-19 Vaccine 12/14/2019  3:51 PM 0.3 mL 09/03/2019 Intramuscular   Manufacturer: [place]   Lot: [id number]   [place]: [id number]

## 2019-12-22 DIAGNOSIS — K449 Diaphragmatic hernia without obstruction or gangrene: Secondary | ICD-10-CM | POA: Diagnosis not present

## 2020-01-19 DIAGNOSIS — E890 Postprocedural hypothyroidism: Secondary | ICD-10-CM | POA: Diagnosis not present

## 2020-01-19 DIAGNOSIS — C73 Malignant neoplasm of thyroid gland: Secondary | ICD-10-CM | POA: Diagnosis not present

## 2020-01-20 ENCOUNTER — Encounter (HOSPITAL_BASED_OUTPATIENT_CLINIC_OR_DEPARTMENT_OTHER): Payer: Self-pay

## 2020-01-20 ENCOUNTER — Ambulatory Visit (HOSPITAL_BASED_OUTPATIENT_CLINIC_OR_DEPARTMENT_OTHER): Admit: 2020-01-20 | Payer: BC Managed Care – PPO | Admitting: Orthopedic Surgery

## 2020-01-20 SURGERY — EXCISION MASS
Anesthesia: Choice | Laterality: Right

## 2020-01-21 DIAGNOSIS — K449 Diaphragmatic hernia without obstruction or gangrene: Secondary | ICD-10-CM | POA: Diagnosis not present

## 2020-01-26 DIAGNOSIS — E890 Postprocedural hypothyroidism: Secondary | ICD-10-CM | POA: Diagnosis not present

## 2020-01-26 DIAGNOSIS — M899 Disorder of bone, unspecified: Secondary | ICD-10-CM | POA: Diagnosis not present

## 2020-01-26 DIAGNOSIS — C73 Malignant neoplasm of thyroid gland: Secondary | ICD-10-CM | POA: Diagnosis not present

## 2020-01-26 MED FILL — LEVOTHYROXINE 150 MCG TAB: 150 | 30 days supply | Qty: 30 | Fill #0

## 2020-02-15 DIAGNOSIS — F5101 Primary insomnia: Secondary | ICD-10-CM | POA: Diagnosis not present

## 2020-02-15 DIAGNOSIS — G43009 Migraine without aura, not intractable, without status migrainosus: Secondary | ICD-10-CM | POA: Diagnosis not present

## 2020-02-15 DIAGNOSIS — Z6830 Body mass index (BMI) 30.0-30.9, adult: Secondary | ICD-10-CM | POA: Diagnosis not present

## 2020-02-15 DIAGNOSIS — F331 Major depressive disorder, recurrent, moderate: Secondary | ICD-10-CM | POA: Diagnosis not present

## 2020-02-15 DIAGNOSIS — M4722 Other spondylosis with radiculopathy, cervical region: Secondary | ICD-10-CM | POA: Diagnosis not present

## 2020-02-18 DIAGNOSIS — R1013 Epigastric pain: Secondary | ICD-10-CM | POA: Diagnosis not present

## 2020-02-21 DIAGNOSIS — K449 Diaphragmatic hernia without obstruction or gangrene: Secondary | ICD-10-CM | POA: Diagnosis not present

## 2020-02-29 DIAGNOSIS — H353132 Nonexudative age-related macular degeneration, bilateral, intermediate dry stage: Secondary | ICD-10-CM | POA: Diagnosis not present

## 2020-03-07 DIAGNOSIS — R112 Nausea with vomiting, unspecified: Secondary | ICD-10-CM | POA: Diagnosis not present

## 2020-03-07 DIAGNOSIS — K591 Functional diarrhea: Secondary | ICD-10-CM | POA: Diagnosis not present

## 2020-03-07 DIAGNOSIS — R1013 Epigastric pain: Secondary | ICD-10-CM | POA: Diagnosis not present

## 2020-03-07 DIAGNOSIS — K911 Postgastric surgery syndromes: Secondary | ICD-10-CM | POA: Diagnosis not present

## 2020-03-20 ENCOUNTER — Other Ambulatory Visit: Payer: Self-pay

## 2020-03-20 ENCOUNTER — Ambulatory Visit (INDEPENDENT_AMBULATORY_CARE_PROVIDER_SITE_OTHER): Payer: PPO | Admitting: Podiatry

## 2020-03-20 ENCOUNTER — Encounter: Payer: Self-pay | Admitting: Podiatry

## 2020-03-20 ENCOUNTER — Ambulatory Visit (INDEPENDENT_AMBULATORY_CARE_PROVIDER_SITE_OTHER): Payer: PPO

## 2020-03-20 DIAGNOSIS — R52 Pain, unspecified: Secondary | ICD-10-CM

## 2020-03-20 DIAGNOSIS — S90212A Contusion of left great toe with damage to nail, initial encounter: Secondary | ICD-10-CM

## 2020-03-20 DIAGNOSIS — M79675 Pain in left toe(s): Secondary | ICD-10-CM

## 2020-03-20 NOTE — Patient Instructions (Signed)

## 2020-03-20 NOTE — Progress Notes (Addendum)
  Subjective:  Patient ID: Rebecca Marshall, female    DOB: 1951/09/01,  MRN: [id number]  Chief Complaint  Patient presents with  . Toe Injury    left great toe injury, occured on saturday. Pt dropped dresser on her great toe     69 y.o. female presents with the above new complaint.  She is well-known to Dr. Paulla Dolly from previous right foot surgery, first MP joint placement surgery, which is doing well and has no issues.  She has pain and bruising after dropping the dresser on her left hallux on Saturday.  Pain is throbbing and stabbing.  This is worsened by walking and movement.  Icing elevating is been helpful.  She notes blood into the toenail and that feels loose.  Review of Systems: Negative except as noted in the HPI. Denies N/V/F/Ch.  Objective:  There were no vitals filed for this visit. General AA&O x3. Normal mood and affect.  Vascular Dorsalis pedis and posterior tibial pulses  present 2+ bilaterally  Capillary refill normal to all digits. Pedal hair growth normal.  Neurologic Epicritic sensation grossly present.  Dermatologic  left hallux subungual hematoma, nailbed appears to be well adhered proximally and around the borders.  Orthopedic: MMT 5/5 in dorsiflexion, plantarflexion, inversion, and eversion. Normal joint ROM without pain or crepitus.   Radiology 3 weightbearing views of the left foot were taken.  There is no fracture of the distal proximal phalanx of the left hallux.  Joints congruous.  Assessment & Plan:   Encounter Diagnoses  Name Primary?  . Pain in toe of left foot   . Contusion of left great toe with damage to nail, initial encounter Yes    Patient was evaluated and treated and all questions answered.  -X-ray results reviewed with patient -We discussed treatment for this and that at some point the toenail may become loose and avulsed when a new nail grows in.  Educated her on the benefits of total nail avulsion versus evacuating subungual hematoma  today.  We decided that subungual hematoma x-ray should be the best choice as the nail is still well adhered.  Following verbal consent, local administration of 3 cc of 50-50 mix of 2% Xylocaine plain and 0.5 % Sensorcaine plain to the left hallux and a hallux block fashion, the left hallux nail plate was punctured with a 20-gauge needle after Betadine prep.  Several cc of blood was expressed from beneath the toenail.  No active bleeding was noted.  Hemostasis was achieved and a small adhesive bandage with Neosporin ointment was applied. -Follow-up as needed or less if the toenail becomes loose and needs avulsion in the future.  Lanae Crumbly, DPM 03/20/2020      Return if symptoms worsen or fail to improve.

## 2020-03-22 DIAGNOSIS — K449 Diaphragmatic hernia without obstruction or gangrene: Secondary | ICD-10-CM | POA: Diagnosis not present

## 2020-03-28 DIAGNOSIS — C73 Malignant neoplasm of thyroid gland: Secondary | ICD-10-CM | POA: Diagnosis not present

## 2020-03-28 DIAGNOSIS — E890 Postprocedural hypothyroidism: Secondary | ICD-10-CM | POA: Diagnosis not present

## 2020-04-14 ENCOUNTER — Other Ambulatory Visit: Payer: Self-pay | Admitting: Podiatry

## 2020-04-14 DIAGNOSIS — S90212A Contusion of left great toe with damage to nail, initial encounter: Secondary | ICD-10-CM

## 2020-04-15 ENCOUNTER — Other Ambulatory Visit: Payer: Self-pay | Admitting: Student

## 2020-04-22 DIAGNOSIS — K449 Diaphragmatic hernia without obstruction or gangrene: Secondary | ICD-10-CM | POA: Diagnosis not present

## 2020-05-05 IMAGING — CT CT ANGIOGRAPHY CHEST
2 of 8 series · 17 of 46 positions shown · IV contrast (OMNIPAQUE)
Comparison: Chest CT dated 03/24/2019

CLINICAL DATA: 67-year-old female with left thoracotomy for hiatal
hernia 2 weeks ago. Patient presenting with left-sided chest pain
and shortness of breath and bilateral leg swelling.

EXAM:
CT ANGIOGRAPHY CHEST WITH CONTRAST
TECHNIQUE: Multidetector CT imaging of the chest was performed using the
standard protocol during bolus administration of intravenous
contrast. Multiplanar CT image reconstructions and MIPs were
obtained to evaluate the vascular anatomy.
CONTRAST:  100mL OMNIPAQUE IOHEXOL 350 MG/ML SOLN

[Series 5: thins · axial · 0.71mm/px · z∈[-221,-9]mm · 15 of 240 slices shown]
[im 14/240  lung]
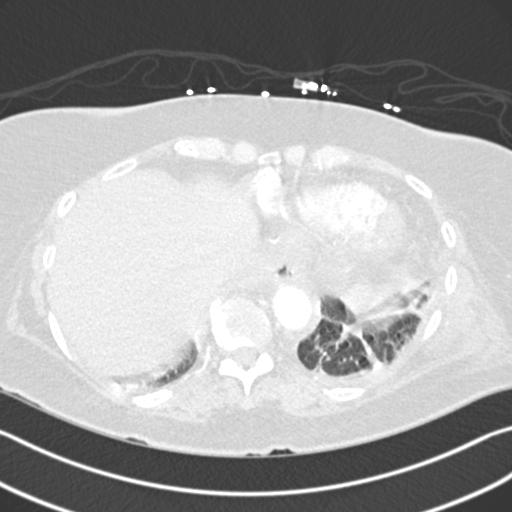
[im 27/240  soft-tissue]
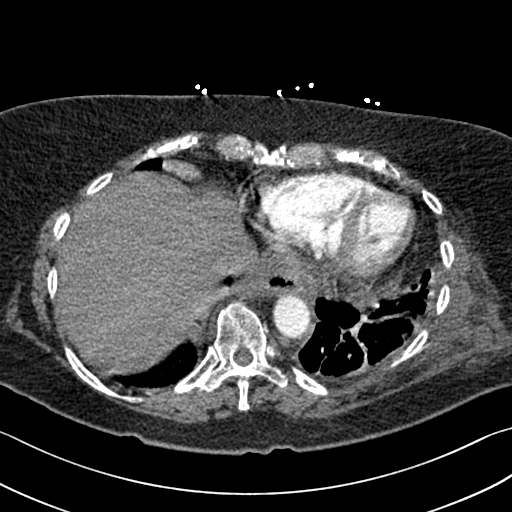
[im 40/240  lung]
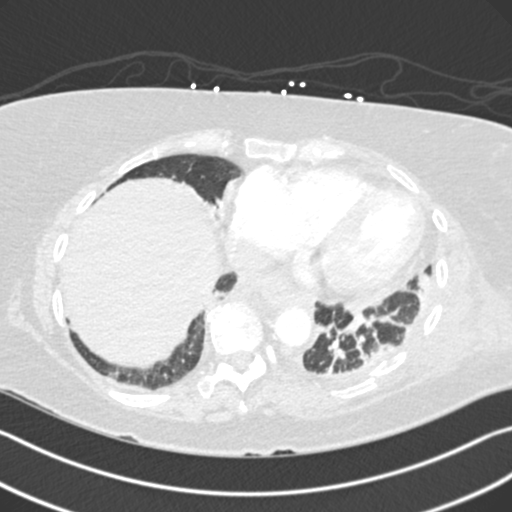
[im 54/240  soft-tissue]
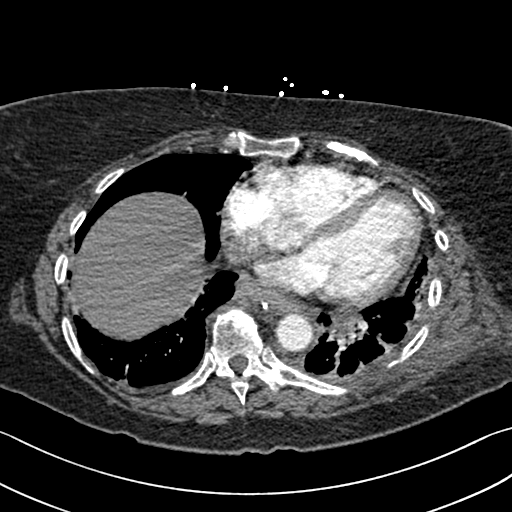
[im 80/240  lung]
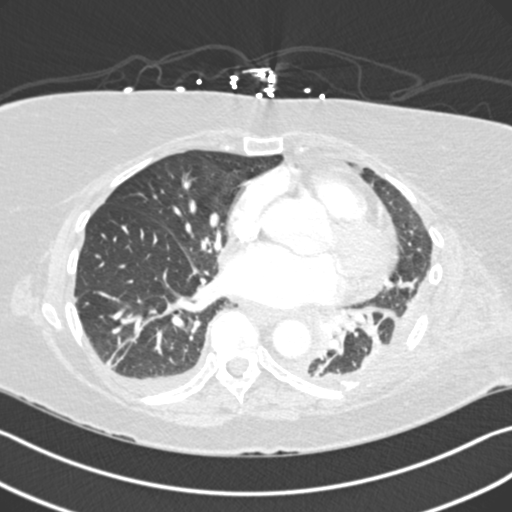
[im 93/240  soft-tissue]
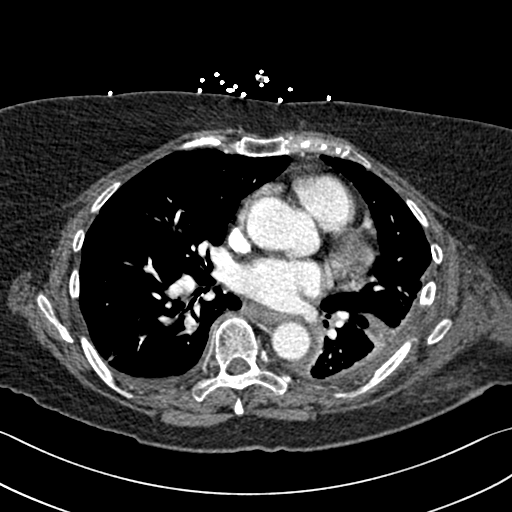
[im 107/240  lung]
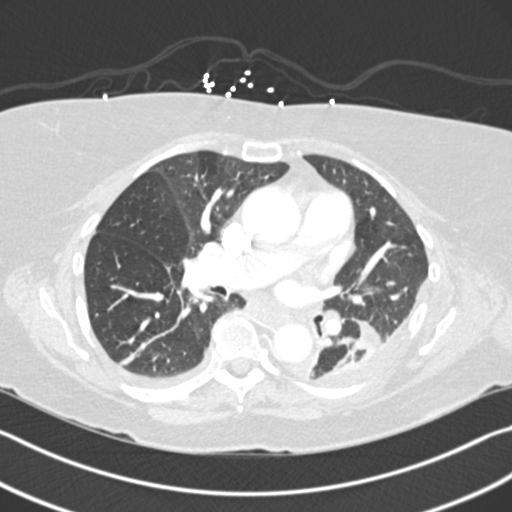
[im 120/240  soft-tissue]
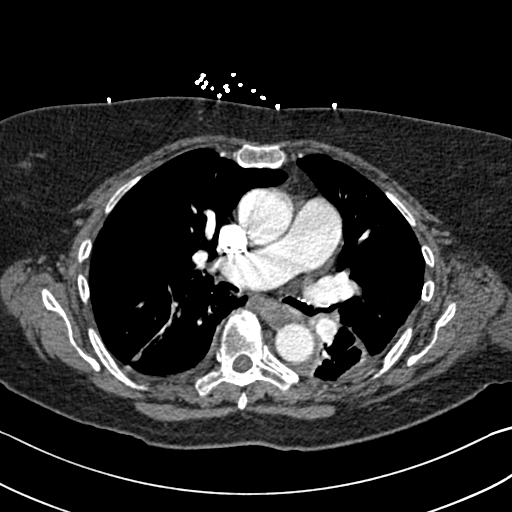
[im 133/240  lung]
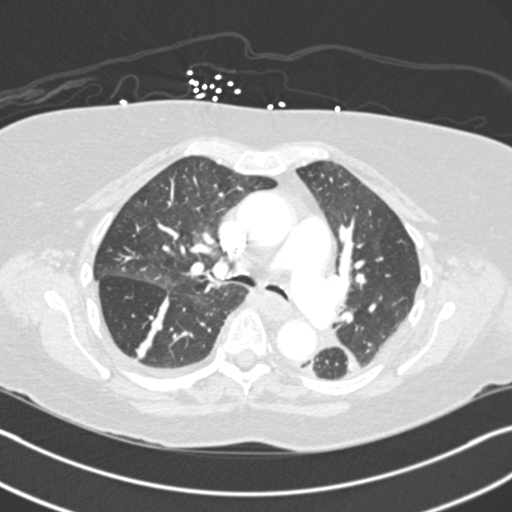
[im 147/240  soft-tissue]
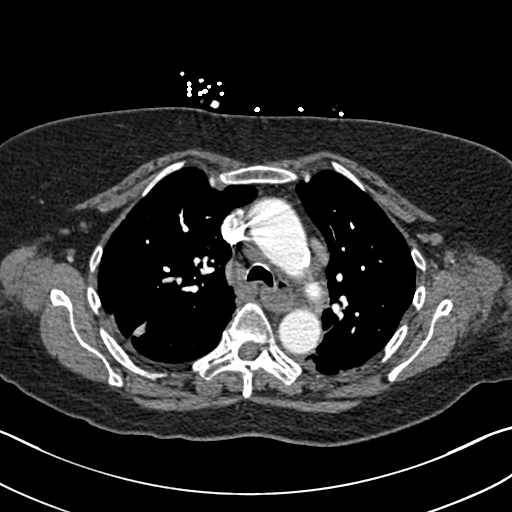
[im 160/240  lung]
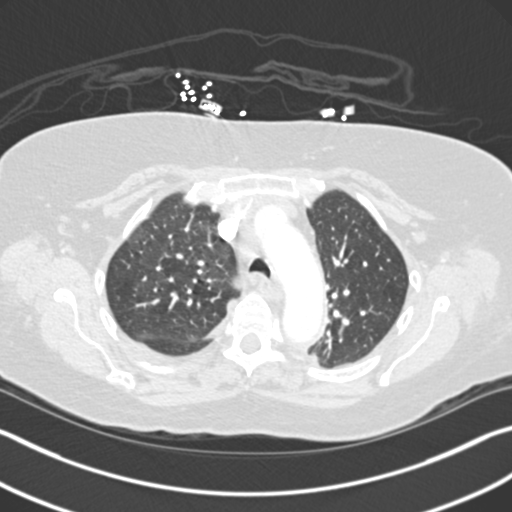
[im 186/240  soft-tissue]
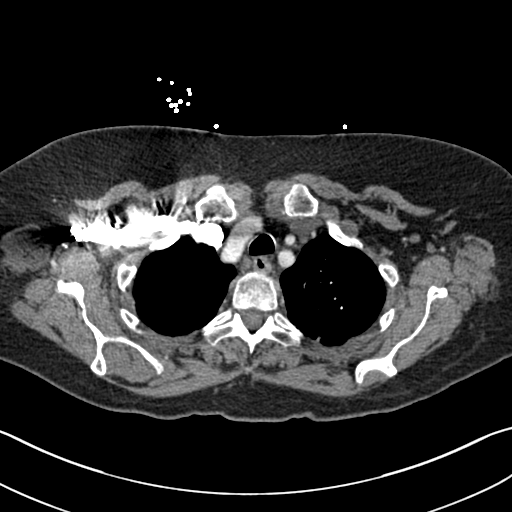
[im 200/240  lung]
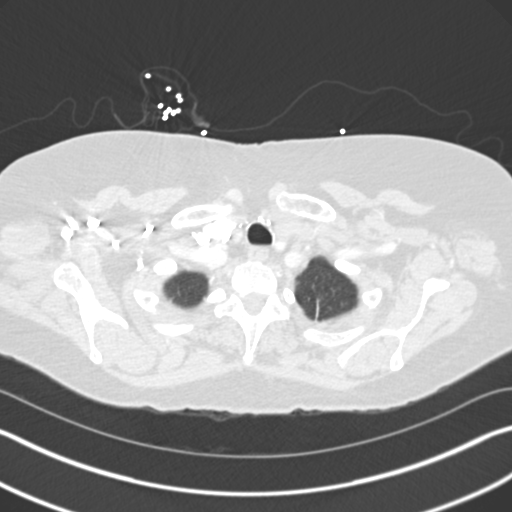
[im 213/240  soft-tissue]
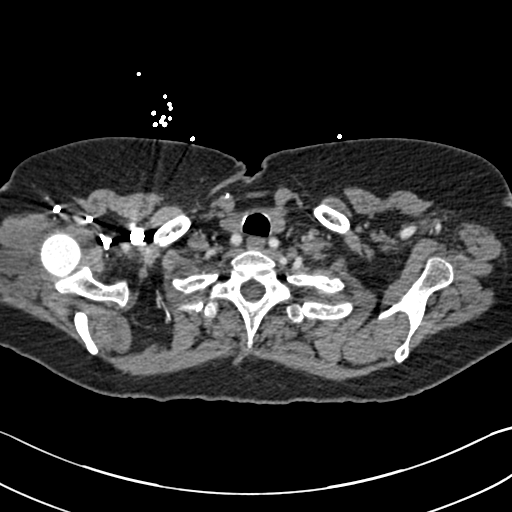
[im 226/240  lung]
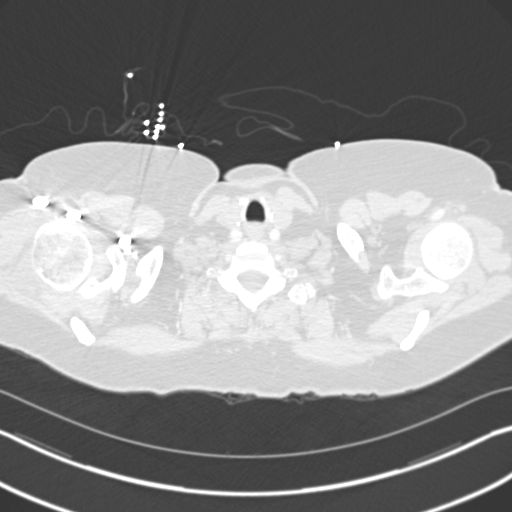

[Series 6: coronal mpr · coronal · 0.49mm/px · 2 of 70 slices shown]
[im 24/70  soft-tissue]
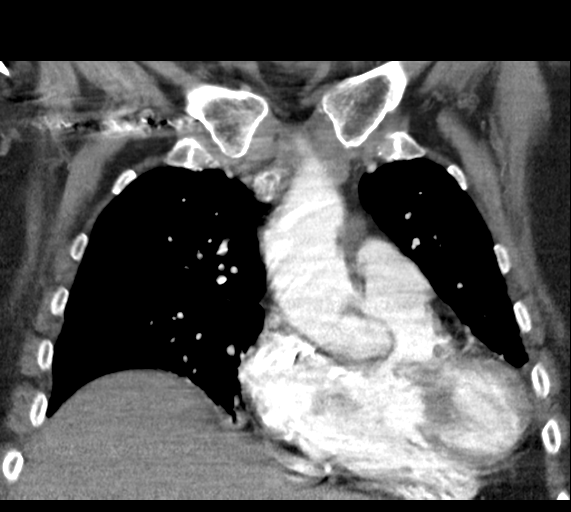
[im 47/70  soft-tissue]
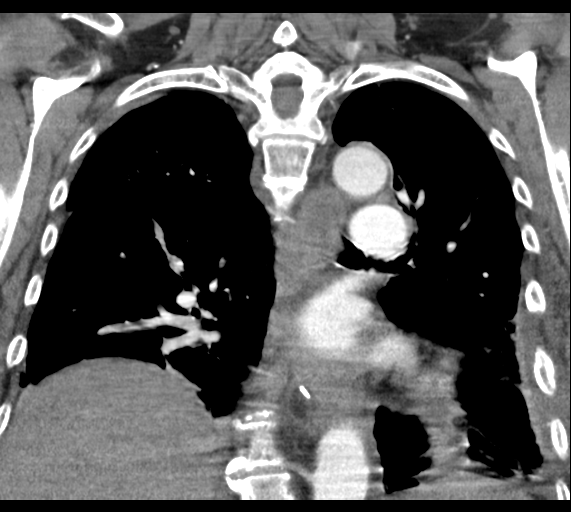

[17 of 46 positions shown; findings below may reference images not displayed]

FINDINGS: Evaluation of this exam is limited due to respiratory motion
artifact.

Cardiovascular: There is mild cardiomegaly. No pericardial effusion.
Mild atherosclerotic calcification of the thoracic aorta. No
aneurysmal dilatation or dissection. The origins of the great
vessels of the aortic arch are patent. No pulmonary artery embolus
identified.

Mediastinum/Nodes: There is no hilar or mediastinal adenopathy.
Postsurgical changes of hiatal hernia repair with multiple surgical
staples and sutures. There is soft tissue thickening of the distal
esophagus, likely postoperative. Evaluation is limited due to
respiratory motion artifact. No definite drainable fluid collection
identified. There is fluid extending into the anterior paravertebral
space and adjacent to the aorta.

Lungs/Pleura: There are small bilateral pleural effusions bibasilar
linear densities may represent atelectasis or infiltrate. No lobar
consolidation. There is a 12 mm ground-glass nodular density in the
right upper lobe adjacent the right hilum (series 10 image 64).
There is no pneumothorax. The central airways are patent.

Upper Abdomen: A 12 mm indeterminate left adrenal nodule.
Cholecystectomy clips and mild dilatation of the intrahepatic
biliary tree.

Musculoskeletal: Minimally displaced fracture of the lateral left
seventh rib. Fractures of the posterior left eighth and ninth ribs.
These fractures may be postsurgical and related to thoracotomy.
Clinical correlation is recommended. There is degenerative changes
of the spine.

Review of the MIP images confirms the above findings.
IMPRESSION: 1. No CT evidence of pulmonary embolism.
2. Postsurgical changes of hiatal hernia repair. There is soft
tissue thickening of the distal esophagus with small amount of
surrounding fluid, likely postoperative. No definite drainable fluid
collection identified.
3. Small bilateral pleural effusions with bibasilar linear densities
likely atelectasis and less likely infiltrate. Clinical correlation
is recommended.
4. Aortic Atherosclerosis ([id number]).
5. Multiple left rib fractures, likely related to thoracotomy.
Clinical correlation is recommended.
6. A 12 mm ground-glass nodular density in the right upper lobe, new
since the prior CTs and likely an area of vascular and parenchymal
confluent or atelectasis. Attention on follow-up imaging
recommended.

## 2020-05-12 ENCOUNTER — Other Ambulatory Visit: Payer: Self-pay | Admitting: Family Medicine

## 2020-05-12 DIAGNOSIS — Z1231 Encounter for screening mammogram for malignant neoplasm of breast: Secondary | ICD-10-CM

## 2020-05-17 DIAGNOSIS — G43009 Migraine without aura, not intractable, without status migrainosus: Secondary | ICD-10-CM | POA: Diagnosis not present

## 2020-05-17 DIAGNOSIS — Z79899 Other long term (current) drug therapy: Secondary | ICD-10-CM | POA: Diagnosis not present

## 2020-05-17 DIAGNOSIS — F5101 Primary insomnia: Secondary | ICD-10-CM | POA: Diagnosis not present

## 2020-05-17 DIAGNOSIS — Z6830 Body mass index (BMI) 30.0-30.9, adult: Secondary | ICD-10-CM | POA: Diagnosis not present

## 2020-05-17 DIAGNOSIS — E6609 Other obesity due to excess calories: Secondary | ICD-10-CM | POA: Diagnosis not present

## 2020-05-17 DIAGNOSIS — F331 Major depressive disorder, recurrent, moderate: Secondary | ICD-10-CM | POA: Diagnosis not present

## 2020-05-17 DIAGNOSIS — Z13220 Encounter for screening for lipoid disorders: Secondary | ICD-10-CM | POA: Diagnosis not present

## 2020-05-23 DIAGNOSIS — K449 Diaphragmatic hernia without obstruction or gangrene: Secondary | ICD-10-CM | POA: Diagnosis not present

## 2020-05-23 DIAGNOSIS — F4322 Adjustment disorder with anxiety: Secondary | ICD-10-CM | POA: Diagnosis not present

## 2020-06-03 ENCOUNTER — Ambulatory Visit: Payer: PPO | Attending: Internal Medicine

## 2020-06-03 DIAGNOSIS — Z23 Encounter for immunization: Secondary | ICD-10-CM

## 2020-06-03 NOTE — Progress Notes (Signed)
   Covid-19 Vaccination Clinic  Name:  Rebecca Marshall    MRN: [id number] DOB: 06-Nov-1950  06/03/2020  Rebecca Marshall was observed post Covid-19 immunization for 15 minutes without incident. She was provided with Vaccine Information Sheet and instruction to access the V-Safe system.   Rebecca Marshall was instructed to call 911 with any severe reactions post vaccine: Marland Kitchen Difficulty breathing  . Swelling of face and throat  . A fast heartbeat  . A bad rash all over body  . Dizziness and weakness

## 2020-06-05 ENCOUNTER — Other Ambulatory Visit: Payer: Self-pay | Admitting: Family Medicine

## 2020-06-05 DIAGNOSIS — R911 Solitary pulmonary nodule: Secondary | ICD-10-CM

## 2020-06-16 ENCOUNTER — Ambulatory Visit
Admission: RE | Admit: 2020-06-16 | Discharge: 2020-06-16 | Disposition: A | Discharge status: Home / Self Care | Payer: PPO | Source: Ambulatory Visit | Attending: Family Medicine | Admitting: Family Medicine

## 2020-06-16 DIAGNOSIS — S2242XA Multiple fractures of ribs, left side, initial encounter for closed fracture: Secondary | ICD-10-CM | POA: Diagnosis not present

## 2020-06-16 DIAGNOSIS — Z9049 Acquired absence of other specified parts of digestive tract: Secondary | ICD-10-CM | POA: Diagnosis not present

## 2020-06-16 DIAGNOSIS — R911 Solitary pulmonary nodule: Secondary | ICD-10-CM | POA: Diagnosis not present

## 2020-06-16 DIAGNOSIS — J984 Other disorders of lung: Secondary | ICD-10-CM | POA: Diagnosis not present

## 2020-06-22 DIAGNOSIS — K449 Diaphragmatic hernia without obstruction or gangrene: Secondary | ICD-10-CM | POA: Diagnosis not present

## 2020-06-27 DIAGNOSIS — R1013 Epigastric pain: Secondary | ICD-10-CM | POA: Diagnosis not present

## 2020-06-27 DIAGNOSIS — K219 Gastro-esophageal reflux disease without esophagitis: Secondary | ICD-10-CM | POA: Diagnosis not present

## 2020-06-27 DIAGNOSIS — K529 Noninfective gastroenteritis and colitis, unspecified: Secondary | ICD-10-CM | POA: Diagnosis not present

## 2020-06-27 DIAGNOSIS — R29898 Other symptoms and signs involving the musculoskeletal system: Secondary | ICD-10-CM | POA: Diagnosis not present

## 2020-06-27 DIAGNOSIS — Z885 Allergy status to narcotic agent status: Secondary | ICD-10-CM | POA: Diagnosis not present

## 2020-06-27 DIAGNOSIS — Z888 Allergy status to other drugs, medicaments and biological substances status: Secondary | ICD-10-CM | POA: Diagnosis not present

## 2020-06-27 DIAGNOSIS — Z886 Allergy status to analgesic agent status: Secondary | ICD-10-CM | POA: Diagnosis not present

## 2020-07-04 DIAGNOSIS — M545 Low back pain, unspecified: Secondary | ICD-10-CM | POA: Diagnosis not present

## 2020-07-04 DIAGNOSIS — N281 Cyst of kidney, acquired: Secondary | ICD-10-CM | POA: Diagnosis not present

## 2020-07-04 DIAGNOSIS — K7689 Other specified diseases of liver: Secondary | ICD-10-CM | POA: Diagnosis not present

## 2020-07-04 DIAGNOSIS — R195 Other fecal abnormalities: Secondary | ICD-10-CM | POA: Diagnosis not present

## 2020-07-04 DIAGNOSIS — R29898 Other symptoms and signs involving the musculoskeletal system: Secondary | ICD-10-CM | POA: Diagnosis not present

## 2020-07-04 DIAGNOSIS — M6281 Muscle weakness (generalized): Secondary | ICD-10-CM | POA: Diagnosis not present

## 2020-07-04 DIAGNOSIS — G8929 Other chronic pain: Secondary | ICD-10-CM | POA: Diagnosis not present

## 2020-07-10 NOTE — Progress Notes (Signed)
HPI: Follow-up atrial tachycardia, reported history of bradycardia, neurally mediated syncope, history of bradycardia and obstructive sleep apnea.  Previously followed by Dr. Caryl Comes.  Last echocardiogram March 2017 showed normal LV function, grade 2 diastolic dysfunction, trace aortic insufficiency, moderate left atrial enlargement and mild right atrial enlargement.  Carotid Dopplers March 2017 normal.  Nuclear study March 2017 showed ejection fraction 72%, breast attenuation but no ischemia.  Since last seen she denies dyspnea, chest pain or syncope.  She has occasional brief palpitations for 5 to 10 seconds that resolved spontaneously.  Current Outpatient Medications  Medication Sig Dispense Refill  . acetaminophen (TYLENOL) 500 MG tablet Take 1,000 mg by mouth every 8 (eight) hours as needed for mild pain or moderate pain.     . colestipol (COLESTID) 1 g tablet Take 1 g by mouth in the morning and at bedtime.    . diazepam (VALIUM) 10 MG tablet Take 10 mg by mouth daily as needed.    . eletriptan (RELPAX) 40 MG tablet Take by mouth one at the beginning of headache and may repeat once in 2  Hours for maximum of 2 tabs in 24hours 27 tablet 3  . flecainide (TAMBOCOR) 150 MG tablet Take 1 tablet (150 mg total) by mouth 2 (two) times daily. Please make yearly appt with Dr. Caryl Comes for October for future refills. 1st attempt 180 tablet 0  . levothyroxine (SYNTHROID) 150 MCG tablet Take 150 mcg by mouth daily before breakfast.    . Melatonin 10 MG TABS Take 10 mg by mouth at bedtime as needed (for sleep.).    Marland Kitchen Pancrelipase, Lip-Prot-Amyl, (CREON PO) Take by mouth 3 (three) times daily. Pt takes 2 tablets with each meal.    . sertraline (ZOLOFT) 100 MG tablet Take 200 mg by mouth daily.    . traZODone (DESYREL) 100 MG tablet Take 300 mg by mouth at bedtime.     No current facility-administered medications for this visit.     Past Medical History:  Diagnosis Date  . Acne nodule 11/26/2011  .  ADENOCARCINOMA, THYROID GLAND, PAPILLARY 05/17/2009   04/2009 Two surgeries for thyroid cancer Path report from last surgery :THYROID, LEFT, HEMITHYROIDECTOMY: - MULTIPLE FOCI OF FOLLICULAR VARIANT OR PAPILLARY THYROID CARCINOMA, 1.7 CM IN GREATEST DIMENSION, CONFINED WITHIN THE THYROID TISSUE. - ANGIOLYMPHATIC INVASION PRESENT. - ISTHMUS RESECTION MARGIN INVOLVED BY TUMOR. - FOLLICULAR ADENOMA(S) WITH ASSOCIATED LYMPHOCYTIC THYROIDITIS.     . AK (actinic keratosis) 04/16/2012  . ALLERGIC RHINITIS 01/30/2009   Qualifier: Diagnosis of  By: Andria Frames MD, Gwyndolyn Saxon    . Anxiety   . ANXIETY DEPRESSION 09/03/2010   Long standing depression with anxiety. Good response to medications initially. Has been to [facility].    Marland Kitchen Arthritis   . 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 08/11/2007   LEFT MGQ:QPYPP hip replacement (Dr Berenice Primas) with recalled hardware. Had revision by Dr Berenice Primas in 2011 with  ASR .Marland Kitchen    Marland Kitchen Atrial dysrhythmia    FOLLOWED BY DR Caryl Comes WAS HAVING PALPITATIONS AND SYNCOPAL EPISODES ONSET 9 YEARS AGO ; STARTED ON FLECAIINIDE , PER PATIENT TOLERATES WELL, LAST SYNCOPAL EPISODE WAS OVER 5 YEARS AGO ; SEE LOV IN  EPIC   . Atrial tachycardia ([place]) 06/05/2011   \Treated with flecainide-2011 2010-QRS duration 88 ms 2011 QRS duration 88 ms 2014 QRS duration 98 ms 2014 QRS 120mg    120 ms    . Bilateral bunions 12/18/2010  . Bilateral knee pain 06/29/2015  .  Cancer [facility])    thyroid-status post resection on replacement Dr. Chalmers Cater  . Clinical depression 03/06/2016  . Cognitive changes 10/12/2014  . Complication of anesthesia    FENTANYL CAUSED ANAPHYLAXIS DURING CA COLONOSCOPY  . Depression   . Dysrhythmia    Dr Caryl Comes monitoring patient, atrial tachycardia under control, cardiac clearance 09/10/17  . Family history of diabetes mellitus (DM) 05/11/2014  . Fibromyalgia   . Gait abnormality 12/18/2010  . GE reflux   . GERD 07/14/2007   Qualifier: Diagnosis of  By: Nori Riis MD, Clarise Cruz    . Headache    migraines  .  Headache upon awakening 06/05/2011  . Herniated lumbar disc without myelopathy 12/10/2013   Had surgery by neurosurgery in December, 2014.L4-5 laminotomy and microdiscectomy Dr Margreta Journey Done at [facility]   . Hip pain, chronic 04/05/2015   Status post THR plus revision.   . Hypoxemia 01/30/2015  . Memory change 05/12/2014   Reports problems with short-term memory, focusing, concentration in the last 6-12 months. Problems were significant enough that she had to quit her part-time job is fairly distressed about this.   . Metatarsalgia 12/18/2010  . Nausea & vomiting 06/05/2011  . Nocturia more than twice per night 01/30/2015  . Nonallopathic lesion of cervical region 11/26/2011  . Nonallopathic lesion of lumbosacral region 11/26/2011  . OSA on CPAP 01/30/2015  . Osteoporosis   . PONV (postoperative nausea and vomiting)    did not have nausea/vomiting with nissen fundoplication done 06/9322 at [facility]  . Poor compliance with CPAP treatment 03/24/2017  . Rash and nonspecific skin eruption 01/09/2016    located in the 10 o'clock position on Right breast    . Severe obesity (BMI >= 40) ([place]) 01/30/2015  . Sleep apnea 10/2015   x 1 year, CPAP  . Snorings 01/30/2015  . Status post Nissen fundoplication 55/03/3219  . Subacute cough 03/27/2012  . Thyroid disease   . Well adult health check 06/05/2011    Past Surgical History:  Procedure Laterality Date  . ABDOMINAL HYSTERECTOMY    . BREAST EXCISIONAL BIOPSY Right 25+ yrs ago   benign  . caroitid dopplers Bilateral 11/2015   normal  . CATARACT EXTRACTION Bilateral 09/05/15, 09/12/15  . CHOLECYSTECTOMY    . COLONOSCOPY  04/2011  . ESOPHAGEAL MANOMETRY N/A 03/31/2017   Procedure: ESOPHAGEAL MANOMETRY (EM);  Surgeon: Ronald Lobo, MD;  Location: [facility];  Service: Endoscopy;  Laterality: N/A;  . HIATAL HERNIA REPAIR N/A 06/05/2018   Procedure: LAPAROSCOPIC  ENTERO-LYSIS  ERAS PATHWAY;  Surgeon: Johnathan Hausen, MD;  Location: [facility];   Service: General;  Laterality: N/A;  . LAPAROSCOPIC NISSEN FUNDOPLICATION N/A 25/12/2704   Procedure: LAPAROSCOPIC NISSEN FUNDOPLICATION;  Surgeon: Johnathan Hausen, MD;  Location: [facility];  Service: General;  Laterality: N/A;  . THYROIDECTOMY  04/2009   papillary [place] Dr. Erik Obey  . TOE SURGERY     bone spur removed - rt great toe  . TOTAL HIP ARTHROPLASTY  2009 and 2011   L hip   . TOTAL HIP ARTHROPLASTY Right 09/12/2017   Procedure: TOTAL HIP ARTHROPLASTY ANTERIOR APPROACH;  Surgeon: Dorna Leitz, MD;  Location: [place];  Service: Orthopedics;  Laterality: Right;  . TUBAL LIGATION    . UPPER GI ENDOSCOPY  06/05/2018   Procedure: UPPER GI ENDOSCOPY;  Surgeon: Johnathan Hausen, MD;  Location: [facility];  Service: General;;    Social History   Socioeconomic History  . Marital status: Married    Spouse name:  Roy  . Number of children: 3  . Years of education: 12+  . Highest education level: Not on file  Occupational History  . Occupation: unemployed   Tobacco Use  . Smoking status: Never Smoker  . Smokeless tobacco: Never Used  Vaping Use  . Vaping Use: Never used  Substance and Sexual Activity  . Alcohol use: No    Alcohol/week: 0.0 standard drinks    Comment: none  . Drug use: No  . Sexual activity: Not on file  Other Topics Concern  . Not on file  Social History Narrative   Patient lives at home alone    Patient is married    Patient is not working    Patient has 3 children    Patient has her BSN RN   Patient is right handed       Daily caffeine use 1 cup daily.   Social Determinants of Health   Financial Resource Strain:   . Difficulty of Paying Living Expenses: Not on file  Food Insecurity:   . Worried About Charity fundraiser in the Last Year: Not on file  . Ran Out of Food in the Last Year: Not on file  Transportation Needs:   . Lack of Transportation (Medical): Not on file  . Lack of Transportation (Non-Medical): Not on file  Physical Activity:   . Days of  Exercise per Week: Not on file  . Minutes of Exercise per Session: Not on file  Stress:   . Feeling of Stress : Not on file  Social Connections:   . Frequency of Communication with Friends and Family: Not on file  . Frequency of Social Gatherings with Friends and Family: Not on file  . Attends Religious Services: Not on file  . Active Member of Clubs or Organizations: Not on file  . Attends Archivist Meetings: Not on file  . Marital Status: Not on file  Intimate Partner Violence:   . Fear of Current or Ex-Partner: Not on file  . Emotionally Abused: Not on file  . Physically Abused: Not on file  . Sexually Abused: Not on file    Family History  Problem Relation Age of Onset  . Cancer Other   . Alcohol abuse Mother   . Cancer Mother 39       Lung   . Alcohol abuse Father   . Breast cancer Sister     ROS: no fevers or chills, productive cough, hemoptysis, dysphasia, odynophagia, melena, hematochezia, dysuria, hematuria, rash, seizure activity, orthopnea, PND, pedal edema, claudication. Remaining systems are negative.  Physical Exam: Well-developed well-nourished in no acute distress.  Skin is warm and dry.  HEENT is normal.  Neck is supple.  Chest is clear to auscultation with normal expansion.  Cardiovascular exam is regular rate and rhythm.  Abdominal exam nontender or distended.  Positive bruit Extremities show no edema. neuro grossly intact  ECG-normal sinus rhythm at a rate of 63, first-degree AV block, RV conduction delay, no ST changes.  Personally reviewed  A/P  1 palpitations/atrial tachycardia-Continue flecainide at present dose.  Approximately 20 minutes spent reviewing patient's previous records prior to her arrival.  2 obstructive sleep apnea-we discussed the importance of using her CPAP.  3 abdominal bruit/question pulsatile mass-schedule abdominal ultrasound to exclude aneurysm.  Kirk Ruths, MD

## 2020-07-12 DIAGNOSIS — M545 Low back pain, unspecified: Secondary | ICD-10-CM | POA: Diagnosis not present

## 2020-07-12 DIAGNOSIS — G8929 Other chronic pain: Secondary | ICD-10-CM | POA: Diagnosis not present

## 2020-07-12 DIAGNOSIS — R29898 Other symptoms and signs involving the musculoskeletal system: Secondary | ICD-10-CM | POA: Diagnosis not present

## 2020-07-12 DIAGNOSIS — M6281 Muscle weakness (generalized): Secondary | ICD-10-CM | POA: Diagnosis not present

## 2020-07-13 ENCOUNTER — Encounter: Payer: Self-pay | Admitting: Cardiology

## 2020-07-13 ENCOUNTER — Ambulatory Visit (INDEPENDENT_AMBULATORY_CARE_PROVIDER_SITE_OTHER): Payer: PPO | Admitting: Cardiology

## 2020-07-13 ENCOUNTER — Other Ambulatory Visit: Payer: Self-pay

## 2020-07-13 VITALS — BP 120/80 | HR 63 | Ht 68.0 in | Wt 198.0 lb

## 2020-07-13 DIAGNOSIS — R002 Palpitations: Secondary | ICD-10-CM | POA: Diagnosis not present

## 2020-07-13 DIAGNOSIS — I471 Supraventricular tachycardia: Secondary | ICD-10-CM | POA: Diagnosis not present

## 2020-07-13 DIAGNOSIS — R0989 Other specified symptoms and signs involving the circulatory and respiratory systems: Secondary | ICD-10-CM

## 2020-07-13 MED ORDER — FLECAINIDE ACETATE 150 MG PO TABS: 150.0000 mg | ORAL_TABLET | Freq: Two times a day (BID) | ORAL | 3 refills | Status: DC

## 2020-07-13 NOTE — Addendum Note (Signed)
Addended by: Orma Render on: 07/13/2020 03:19 PM   Modules accepted: Orders

## 2020-07-13 NOTE — Patient Instructions (Signed)
Medication Instructions:   Refill sent to the pharmacy electronically.   *If you need a refill on your cardiac medications before your next appointment, please call your pharmacy   Testing/Procedures:  Your physician has requested that you have an abdominal aorta duplex. During this test, an ultrasound is used to evaluate the aorta. Allow 30 minutes for this exam. Do not eat after midnight the day before and avoid carbonated beverages [facility]   Follow-Up: At [facility], you and your health needs are our priority.  As part of our continuing mission to provide you with exceptional heart care, we have created designated Provider Care Teams.  These Care Teams include your primary Cardiologist (physician) and Advanced Practice Providers (APPs -  Physician Assistants and Nurse Practitioners) who all work together to provide you with the care you need, when you need it.  We recommend signing up for the patient portal called "MyChart".  Sign up information is provided on this After Visit Summary.  MyChart is used to connect with patients for Virtual Visits (Telemedicine).  Patients are able to view lab/test results, encounter notes, upcoming appointments, etc.  Non-urgent messages can be sent to your provider as well.   To learn more about what you can do with MyChart, go to [email or web address].    Your next appointment:   12 month(s)  The format for your next appointment:   In Person  Provider:   Kirk Ruths, MD

## 2020-07-17 NOTE — Addendum Note (Signed)
Addended by: Patria Mane A on: 07/17/2020 03:28 PM   Modules accepted: Orders

## 2020-07-19 DIAGNOSIS — G8929 Other chronic pain: Secondary | ICD-10-CM | POA: Diagnosis not present

## 2020-07-19 DIAGNOSIS — M545 Low back pain, unspecified: Secondary | ICD-10-CM | POA: Diagnosis not present

## 2020-07-19 DIAGNOSIS — R29898 Other symptoms and signs involving the musculoskeletal system: Secondary | ICD-10-CM | POA: Diagnosis not present

## 2020-07-19 DIAGNOSIS — M6281 Muscle weakness (generalized): Secondary | ICD-10-CM | POA: Diagnosis not present

## 2020-07-24 ENCOUNTER — Other Ambulatory Visit: Payer: Self-pay

## 2020-07-24 ENCOUNTER — Ambulatory Visit (HOSPITAL_COMMUNITY)
Admission: RE | Admit: 2020-07-24 | Discharge: 2020-07-24 | Disposition: A | Discharge status: Home / Self Care | Payer: BC Managed Care – PPO | Source: Ambulatory Visit | Attending: Cardiology | Admitting: Cardiology

## 2020-07-24 DIAGNOSIS — R0989 Other specified symptoms and signs involving the circulatory and respiratory systems: Secondary | ICD-10-CM | POA: Insufficient documentation

## 2020-07-24 DIAGNOSIS — Z136 Encounter for screening for cardiovascular disorders: Secondary | ICD-10-CM | POA: Diagnosis not present

## 2020-08-22 DIAGNOSIS — K449 Diaphragmatic hernia without obstruction or gangrene: Secondary | ICD-10-CM | POA: Diagnosis not present

## 2020-08-23 DIAGNOSIS — I700 Atherosclerosis of aorta: Secondary | ICD-10-CM | POA: Diagnosis not present

## 2020-08-23 DIAGNOSIS — M8580 Other specified disorders of bone density and structure, unspecified site: Secondary | ICD-10-CM | POA: Diagnosis not present

## 2020-08-23 DIAGNOSIS — Z0000 Encounter for general adult medical examination without abnormal findings: Secondary | ICD-10-CM | POA: Diagnosis not present

## 2020-09-04 DIAGNOSIS — K529 Noninfective gastroenteritis and colitis, unspecified: Secondary | ICD-10-CM | POA: Diagnosis not present

## 2020-09-04 DIAGNOSIS — R1319 Other dysphagia: Secondary | ICD-10-CM | POA: Diagnosis not present

## 2020-09-04 DIAGNOSIS — R197 Diarrhea, unspecified: Secondary | ICD-10-CM | POA: Diagnosis not present

## 2020-09-04 DIAGNOSIS — K449 Diaphragmatic hernia without obstruction or gangrene: Secondary | ICD-10-CM | POA: Diagnosis not present

## 2020-09-04 DIAGNOSIS — R1013 Epigastric pain: Secondary | ICD-10-CM | POA: Diagnosis not present

## 2020-09-04 DIAGNOSIS — R1310 Dysphagia, unspecified: Secondary | ICD-10-CM | POA: Diagnosis not present

## 2020-09-04 DIAGNOSIS — Z800 Family history of malignant neoplasm of digestive organs: Secondary | ICD-10-CM | POA: Diagnosis not present

## 2020-09-04 DIAGNOSIS — K219 Gastro-esophageal reflux disease without esophagitis: Secondary | ICD-10-CM | POA: Diagnosis not present

## 2020-09-04 DIAGNOSIS — R110 Nausea: Secondary | ICD-10-CM | POA: Diagnosis not present

## 2020-09-12 DIAGNOSIS — K30 Functional dyspepsia: Secondary | ICD-10-CM | POA: Diagnosis not present

## 2020-09-12 DIAGNOSIS — K219 Gastro-esophageal reflux disease without esophagitis: Secondary | ICD-10-CM | POA: Diagnosis not present

## 2020-09-12 DIAGNOSIS — R112 Nausea with vomiting, unspecified: Secondary | ICD-10-CM | POA: Diagnosis not present

## 2020-09-12 DIAGNOSIS — K3189 Other diseases of stomach and duodenum: Secondary | ICD-10-CM | POA: Diagnosis not present

## 2020-09-12 DIAGNOSIS — R110 Nausea: Secondary | ICD-10-CM | POA: Diagnosis not present

## 2020-09-13 ENCOUNTER — Other Ambulatory Visit: Payer: Self-pay | Admitting: Family Medicine

## 2020-09-13 DIAGNOSIS — M8580 Other specified disorders of bone density and structure, unspecified site: Secondary | ICD-10-CM

## 2020-09-18 ENCOUNTER — Ambulatory Visit
Admission: RE | Admit: 2020-09-18 | Discharge: 2020-09-18 | Disposition: A | Discharge status: Home / Self Care | Payer: PPO | Source: Ambulatory Visit | Attending: Family Medicine | Admitting: Family Medicine

## 2020-09-18 ENCOUNTER — Other Ambulatory Visit: Payer: Self-pay

## 2020-09-18 DIAGNOSIS — Z1231 Encounter for screening mammogram for malignant neoplasm of breast: Secondary | ICD-10-CM

## 2020-10-23 ENCOUNTER — Other Ambulatory Visit: Payer: PPO

## 2020-10-23 DIAGNOSIS — Z20822 Contact with and (suspected) exposure to covid-19: Secondary | ICD-10-CM | POA: Diagnosis not present

## 2020-10-24 LAB — SARS-COV-2, NAA 2 DAY TAT

## 2020-10-24 LAB — NOVEL CORONAVIRUS, NAA: SARS-CoV-2, NAA: DETECTED — AB

## 2020-10-25 ENCOUNTER — Telehealth: Payer: Self-pay | Admitting: Family

## 2020-10-25 ENCOUNTER — Other Ambulatory Visit: Payer: Self-pay | Admitting: Family

## 2020-10-25 ENCOUNTER — Ambulatory Visit (HOSPITAL_COMMUNITY)
Admission: RE | Admit: 2020-10-25 | Discharge: 2020-10-25 | Disposition: A | Discharge status: Home / Self Care | Payer: Medicare HMO | Source: Ambulatory Visit | Attending: Pulmonary Disease | Admitting: Pulmonary Disease

## 2020-10-25 DIAGNOSIS — U071 COVID-19: Secondary | ICD-10-CM

## 2020-10-25 DIAGNOSIS — G4733 Obstructive sleep apnea (adult) (pediatric): Secondary | ICD-10-CM | POA: Insufficient documentation

## 2020-10-25 MED ORDER — EPINEPHRINE 0.3 MG/0.3ML IJ SOAJ: 0.3000 mg | Freq: Once | INTRAMUSCULAR | Status: DC | PRN

## 2020-10-25 MED ORDER — SOTROVIMAB 500 MG/8ML IV SOLN
500.0000 mg | Freq: Once | INTRAVENOUS | Status: AC
  Administered 2020-10-25: 500 mg via INTRAVENOUS

## 2020-10-25 MED ORDER — FAMOTIDINE IN NACL 20-0.9 MG/50ML-% IV SOLN: 20.0000 mg | Freq: Once | INTRAVENOUS | Status: DC | PRN

## 2020-10-25 MED ORDER — ONDANSETRON HCL 4 MG/2ML IJ SOLN
4.0000 mg | Freq: Once | INTRAMUSCULAR | Status: AC
  Administered 2020-10-25: 4 mg via INTRAVENOUS
  Filled 2020-10-25: qty 2

## 2020-10-25 MED ORDER — METHYLPREDNISOLONE SODIUM SUCC 125 MG IJ SOLR: 125.0000 mg | Freq: Once | INTRAMUSCULAR | Status: DC | PRN

## 2020-10-25 MED ORDER — ALBUTEROL SULFATE HFA 108 (90 BASE) MCG/ACT IN AERS: 2.0000 | INHALATION_SPRAY | Freq: Once | RESPIRATORY_TRACT | Status: DC | PRN

## 2020-10-25 MED ORDER — DIPHENHYDRAMINE HCL 50 MG/ML IJ SOLN: 50.0000 mg | Freq: Once | INTRAMUSCULAR | Status: DC | PRN

## 2020-10-25 MED ORDER — SODIUM CHLORIDE 0.9 % IV SOLN: INTRAVENOUS | Status: DC | PRN

## 2020-10-25 NOTE — Discharge Instructions (Signed)

## 2020-10-25 NOTE — Progress Notes (Signed)
Patient reviewed Fact Sheet for Patients, Parents, and Caregivers for Emergency Use Authorization (EUA) of sotrovimab for the Treatment of Coronavirus. Patient also reviewed and is agreeable to the estimated cost of treatment. Patient is agreeable to proceed.   

## 2020-10-25 NOTE — Progress Notes (Signed)
I connected by phone with Rebecca Marshall on 10/25/2020 at 10:19 AM to discuss the potential use of a new treatment for mild to moderate COVID-19 viral infection in non-hospitalized patients.  This patient is a 70 y.o. female that meets the FDA criteria for Emergency Use Authorization of COVID monoclonal antibody sotrovimab.  Has a (+) direct SARS-CoV-2 viral test result  Has mild or moderate COVID-19   Is NOT hospitalized due to COVID-19  Is within 10 days of symptom onset  Has at least one of the high risk factor(s) for progression to severe COVID-19 and/or hospitalization as defined in EUA.  Specific high risk criteria : Older age (>/= 69 yo), BMI > 25 and Chronic Lung Disease   I have spoken and communicated the following to the patient or parent/caregiver regarding COVID monoclonal antibody treatment:  1. FDA has authorized the emergency use for the treatment of mild to moderate COVID-19 in adults and pediatric patients with positive results of direct SARS-CoV-2 viral testing who are 58 years of age and older weighing at least 40 kg, and who are at high risk for progressing to severe COVID-19 and/or hospitalization.  2. The significant known and potential risks and benefits of COVID monoclonal antibody, and the extent to which such potential risks and benefits are unknown.  3. Information on available alternative treatments and the risks and benefits of those alternatives, including clinical trials.  4. Patients treated with COVID monoclonal antibody should continue to self-isolate and use infection control measures (e.g., wear mask, isolate, social distance, avoid sharing personal items, clean and disinfect "high touch" surfaces, and frequent handwashing) according to CDC guidelines.   5. The patient or parent/caregiver has the option to accept or refuse COVID monoclonal antibody treatment.  After reviewing this information with the patient, the patient has agreed to receive one of  the available covid 19 monoclonal antibodies and will be provided an appropriate fact sheet prior to infusion.   Mauricio Po, FNP 10/25/2020 10:19 AM

## 2020-10-25 NOTE — Telephone Encounter (Signed)
Called to discuss with patient about COVID-19 symptoms and the use of one of the available treatments for those with mild to moderate Covid symptoms and at a high risk of hospitalization.  Pt appears to qualify for outpatient treatment due to co-morbid conditions and/or a member of an at-risk group in accordance with the FDA Emergency Use Authorization.    Symptom onset: 10/21/20 Currently having head cold and shortness of breath, fatigue Vaccinated: Yes Booster? Yes Immunocompromised?  Qualifiers: Morbid obesity, Sleep Apnea, Age  I spoke with Rebecca Marshall regarding the risks, benefits and potential financial costs associated with Sotrovimab and Remdesivir. She wishes to proceed with Sotrovimab. She does not qualify for oral medications currently for Paxlovid due to being on Flecanide.  [place],   We contacted you because you were recently diagnosed with COVID-19 and may benefit from a new treatment for mild to moderate disease. This treatment helps reduce the chance of being hospitalized. For some patients with medical conditions that may increase the chances of an infection, the treatment also decreases the risk for serious symptoms related to COVID-19.   The Food and Drug Administration (FDA) approved emergency use of a new drug to treat patients with mild to moderate symptoms who have risk factors that could cause severe symptoms related to COVID-19. This new treatment is a monoclonal antibody. It works by attaching like a magnet to the SARS-CoV2 virus (the virus that causes COVID-19) and stops it from infecting more cells in your body. It does not kill the virus, but it prevents it from spreading throughout your body with the hope that it will decrease your symptoms after it is administered.   This new drug is an intravenous (IV) infusion called Sotrovimab that is given over one 30-minute session in our [facility] outpatient infusion clinic. You will need to stay about 60 minutes  after the infusion to ensure you are tolerating it well and to watch for any allergic reaction to the medication. More information will be given to you at the time of your appointment.   Important information:  . The potential side effects: 2-4% of recipients experience nausea, vomiting, diarrhea, dizziness, headaches, itching, worsening fevers or chills for around 24 hours. . There have been no serious infusion-related reactions. . Of the more than 3,000 patients who received the infusion, only one had an allergic response that ended once the infusion was stopped. This is why we monitor all of our patients closely for 60 minutes after the infusion.  . The COVID-19 vaccine (including boosters) must be delayed at least 90 days after receiving this infusion.  . The medication itself is free, but your insurance will be charged an infusion fee. The amount you may owe later varies from insurance to insurance. If you do not have insurance, we can put you in touch with our billing department. Please contact your insurance agent to discuss prior to your appointment if you would like further details about billing specific to your policy. The CMS code is: M62  . If you have been tested outside of a [facility], you MUST bring a copy of your positive test with you the morning of your appointment. You may take a photo of this and upload to your MyChart portal,  have the testing facility fax the result to [phone] or email a copy to [email or web address].    You have been scheduled to receive the monoclonal antibody therapy at [place]:  10/25/20 at 1:30 pm  The address for the infusion clinic site is:   --The GPS address is [address], and the parking is located near the [employer] building where you will see a "COVID19 Infusion" feather banner marking the entrance to parking. (See photos below.)            --Enter into the 2nd entrance where the "wave, flag banner" is at  the road. Turn into this 2nd entrance and immediately turn left or right to park in one of the marked spaces.   --Please stay in your car and call the desk for assistance inside at [phone]. Let us know which space you are in.    --The average time in the department is roughly 90 minutes to two hours for monoclonal treatment. This includes preparation of the medication, IV start and the required 60-minute monitoring after the infusion.     Should you develop worsening shortness of breath, chest pain or severe breathing problems, please do not wait for this appointment and go to the emergency room for evaluation and treatment instead. You will undergo another oxygen screen before your infusion to ensure this is the best treatment option for you. There is a chance that the best decision may be to send you to the emergency room for evaluation at the time of your appointment.    The day of your visit, you should: Marland Kitchen Get plenty of rest the night before and drink plenty of water. . Eat a light meal/snack before coming and take your medications as prescribed.  . Wear warm, comfortable clothes with a shirt that can roll-up over the elbow (will need IV start).  . Wear a mask.  . Consider bringing an activity to help pass the time.     Regards,  Mauricio Po

## 2020-10-25 NOTE — Progress Notes (Signed)
Diagnosis: COVID-19  Physician: Dr. Patrick Wright  Procedure: Covid Infusion Clinic Med: Sotrovimab infusion - Provided patient with sotrovimab fact sheet for patients, parents, and caregivers prior to infusion.   Complications: No immediate complications noted  Discharge: Discharged home    

## 2020-11-13 DIAGNOSIS — E890 Postprocedural hypothyroidism: Secondary | ICD-10-CM | POA: Diagnosis not present

## 2020-11-30 MED FILL — CYCLOSPORINE 0.05 % EMUL: 0.05 | 30 days supply | Qty: 60 | Fill #0

## 2020-12-06 ENCOUNTER — Other Ambulatory Visit (HOSPITAL_COMMUNITY): Payer: Self-pay | Admitting: Endocrinology

## 2020-12-14 ENCOUNTER — Other Ambulatory Visit (HOSPITAL_BASED_OUTPATIENT_CLINIC_OR_DEPARTMENT_OTHER): Payer: Self-pay

## 2020-12-19 ENCOUNTER — Other Ambulatory Visit (HOSPITAL_BASED_OUTPATIENT_CLINIC_OR_DEPARTMENT_OTHER): Payer: Self-pay

## 2020-12-21 ENCOUNTER — Other Ambulatory Visit: Payer: Self-pay

## 2020-12-21 ENCOUNTER — Ambulatory Visit
Admission: RE | Admit: 2020-12-21 | Discharge: 2020-12-21 | Disposition: A | Discharge status: Home / Self Care | Payer: Medicare HMO | Source: Ambulatory Visit | Attending: Family Medicine | Admitting: Family Medicine

## 2020-12-21 DIAGNOSIS — M8580 Other specified disorders of bone density and structure, unspecified site: Secondary | ICD-10-CM

## 2020-12-21 DIAGNOSIS — K449 Diaphragmatic hernia without obstruction or gangrene: Secondary | ICD-10-CM | POA: Diagnosis not present

## 2020-12-21 DIAGNOSIS — M8588 Other specified disorders of bone density and structure, other site: Secondary | ICD-10-CM | POA: Diagnosis not present

## 2020-12-21 DIAGNOSIS — Z780 Asymptomatic menopausal state: Secondary | ICD-10-CM | POA: Diagnosis not present

## 2020-12-23 DIAGNOSIS — G8929 Other chronic pain: Secondary | ICD-10-CM | POA: Diagnosis not present

## 2020-12-23 DIAGNOSIS — E669 Obesity, unspecified: Secondary | ICD-10-CM | POA: Diagnosis not present

## 2020-12-23 DIAGNOSIS — E8809 Other disorders of plasma-protein metabolism, not elsewhere classified: Secondary | ICD-10-CM | POA: Diagnosis not present

## 2020-12-23 DIAGNOSIS — E039 Hypothyroidism, unspecified: Secondary | ICD-10-CM | POA: Diagnosis not present

## 2020-12-23 DIAGNOSIS — G4700 Insomnia, unspecified: Secondary | ICD-10-CM | POA: Diagnosis not present

## 2020-12-23 DIAGNOSIS — D6869 Other thrombophilia: Secondary | ICD-10-CM | POA: Diagnosis not present

## 2020-12-23 DIAGNOSIS — I4891 Unspecified atrial fibrillation: Secondary | ICD-10-CM | POA: Diagnosis not present

## 2020-12-23 DIAGNOSIS — E785 Hyperlipidemia, unspecified: Secondary | ICD-10-CM | POA: Diagnosis not present

## 2020-12-23 DIAGNOSIS — R69 Illness, unspecified: Secondary | ICD-10-CM | POA: Diagnosis not present

## 2020-12-26 ENCOUNTER — Other Ambulatory Visit (HOSPITAL_COMMUNITY): Payer: Self-pay

## 2020-12-26 DIAGNOSIS — Z9889 Other specified postprocedural states: Secondary | ICD-10-CM | POA: Diagnosis not present

## 2020-12-26 DIAGNOSIS — R197 Diarrhea, unspecified: Secondary | ICD-10-CM | POA: Diagnosis not present

## 2020-12-26 DIAGNOSIS — R109 Unspecified abdominal pain: Secondary | ICD-10-CM | POA: Diagnosis not present

## 2020-12-26 DIAGNOSIS — R29898 Other symptoms and signs involving the musculoskeletal system: Secondary | ICD-10-CM | POA: Diagnosis not present

## 2020-12-26 DIAGNOSIS — K8689 Other specified diseases of pancreas: Secondary | ICD-10-CM | POA: Diagnosis not present

## 2020-12-26 DIAGNOSIS — Z8719 Personal history of other diseases of the digestive system: Secondary | ICD-10-CM | POA: Diagnosis not present

## 2020-12-26 DIAGNOSIS — K219 Gastro-esophageal reflux disease without esophagitis: Secondary | ICD-10-CM | POA: Diagnosis not present

## 2020-12-26 DIAGNOSIS — R140 Abdominal distension (gaseous): Secondary | ICD-10-CM | POA: Diagnosis not present

## 2020-12-26 DIAGNOSIS — K529 Noninfective gastroenteritis and colitis, unspecified: Secondary | ICD-10-CM | POA: Diagnosis not present

## 2020-12-26 DIAGNOSIS — R110 Nausea: Secondary | ICD-10-CM | POA: Diagnosis not present

## 2020-12-26 DIAGNOSIS — R1013 Epigastric pain: Secondary | ICD-10-CM | POA: Diagnosis not present

## 2020-12-26 MED ORDER — CREON 36000-114000 UNITS PO CPEP
ORAL_CAPSULE | ORAL | 3 refills | Status: DC
  Filled 2020-12-26: qty 180, 30d supply, fill #0

## 2021-01-01 ENCOUNTER — Other Ambulatory Visit (HOSPITAL_COMMUNITY): Payer: Self-pay

## 2021-01-01 MED ORDER — LEVOTHYROXINE SODIUM 150 MCG PO TABS
ORAL_TABLET | ORAL | 0 refills | Status: DC
  Filled 2021-01-01: qty 90, 90d supply, fill #0

## 2021-01-03 DIAGNOSIS — M19042 Primary osteoarthritis, left hand: Secondary | ICD-10-CM | POA: Diagnosis not present

## 2021-01-03 DIAGNOSIS — B079 Viral wart, unspecified: Secondary | ICD-10-CM | POA: Diagnosis not present

## 2021-01-03 DIAGNOSIS — M19041 Primary osteoarthritis, right hand: Secondary | ICD-10-CM | POA: Diagnosis not present

## 2021-01-11 DIAGNOSIS — R29898 Other symptoms and signs involving the musculoskeletal system: Secondary | ICD-10-CM | POA: Diagnosis not present

## 2021-01-11 DIAGNOSIS — M19042 Primary osteoarthritis, left hand: Secondary | ICD-10-CM | POA: Diagnosis not present

## 2021-01-11 DIAGNOSIS — M25511 Pain in right shoulder: Secondary | ICD-10-CM | POA: Diagnosis not present

## 2021-01-11 DIAGNOSIS — G8929 Other chronic pain: Secondary | ICD-10-CM | POA: Diagnosis not present

## 2021-01-11 DIAGNOSIS — M19041 Primary osteoarthritis, right hand: Secondary | ICD-10-CM | POA: Diagnosis not present

## 2021-01-16 ENCOUNTER — Telehealth: Payer: Self-pay

## 2021-01-16 NOTE — Telephone Encounter (Signed)
Opened in error

## 2021-01-25 DIAGNOSIS — E890 Postprocedural hypothyroidism: Secondary | ICD-10-CM | POA: Diagnosis not present

## 2021-01-31 ENCOUNTER — Other Ambulatory Visit (HOSPITAL_COMMUNITY): Payer: Self-pay

## 2021-01-31 MED ORDER — CYCLOSPORINE 0.05 % OP EMUL
OPHTHALMIC | 4 refills | Status: DC
  Filled 2021-01-31: qty 24, 90d supply, fill #0
  Filled 2021-10-02: qty 24, 30d supply, fill #0

## 2021-02-06 ENCOUNTER — Other Ambulatory Visit (HOSPITAL_COMMUNITY): Payer: Self-pay

## 2021-02-12 DIAGNOSIS — M899 Disorder of bone, unspecified: Secondary | ICD-10-CM | POA: Diagnosis not present

## 2021-02-12 DIAGNOSIS — E890 Postprocedural hypothyroidism: Secondary | ICD-10-CM | POA: Diagnosis not present

## 2021-02-12 DIAGNOSIS — E162 Hypoglycemia, unspecified: Secondary | ICD-10-CM | POA: Diagnosis not present

## 2021-02-12 DIAGNOSIS — C73 Malignant neoplasm of thyroid gland: Secondary | ICD-10-CM | POA: Diagnosis not present

## 2021-02-13 DIAGNOSIS — C73 Malignant neoplasm of thyroid gland: Secondary | ICD-10-CM | POA: Diagnosis not present

## 2021-02-13 DIAGNOSIS — E162 Hypoglycemia, unspecified: Secondary | ICD-10-CM | POA: Diagnosis not present

## 2021-02-20 DIAGNOSIS — H353132 Nonexudative age-related macular degeneration, bilateral, intermediate dry stage: Secondary | ICD-10-CM | POA: Diagnosis not present

## 2021-02-21 ENCOUNTER — Other Ambulatory Visit (HOSPITAL_COMMUNITY): Payer: Self-pay

## 2021-02-21 MED ORDER — SERTRALINE HCL 100 MG PO TABS
ORAL_TABLET | ORAL | 1 refills | Status: DC
  Filled 2021-02-21: qty 180, 90d supply, fill #0
  Filled 2021-05-30: qty 180, 90d supply, fill #1

## 2021-02-22 ENCOUNTER — Other Ambulatory Visit (HOSPITAL_COMMUNITY): Payer: Self-pay

## 2021-03-05 NOTE — Progress Notes (Signed)
Office Visit Note  Patient: Rebecca Marshall             Date of Birth: Oct 14, 1950           MRN: [id number]             PCP: Kathyrn Lass, MD Referring: Aretta Nip, MD Visit Date: 03/19/2021 Occupation: @GUAROCC @  Subjective:  Pain in both hands.   History of Present Illness: Rebecca Marshall is a 70 y.o. female seen in consultation per request of her PCP.  According to the patient she has had longstanding history of osteoarthritis and degenerative disc disease.  About 6 weeks ago she developed pain and swelling in her right middle finger.  She states this lasted for almost 1 month and then subsided.  She took Tylenol for pain relief.  She also has right middle trigger finger.  She has discomfort in her bilateral hands.  She has history of right shoulder joint osteoarthritis then had injection by Dr. Berenice Primas about 3 months ago.  She also had bilateral total hip replacement by Dr. Berenice Primas in the past.  She believes that she had AVN in her left hip.  She has limited flexion in her right knee joint due to osteoarthritis.  She also had bunionectomy in the past due to osteoarthritis in her feet.  None of the other joints are swollen.  She gives history of dry eyes.  She uses Restasis eyedrops.  She also gives history of Raynauds during the winter months.  There is no history of oral ulcers, nasal ulcers, malar rash, photosensitivity, lymphadenopathy.  There is history of gout in her father.  There is no family history of autoimmune disease.  She is gravida 40, parity 3, miscarriages 7.  Activities of Daily Living:  Patient reports morning stiffness for 1-2 hours.   Patient Reports nocturnal pain.  Difficulty dressing/grooming: Reports Difficulty climbing stairs: Reports Difficulty getting out of chair: Reports Difficulty using hands for taps, buttons, cutlery, and/or writing: Reports  Review of Systems  Constitutional:  Positive for fatigue.  HENT:  Negative for mouth sores, mouth  dryness and nose dryness.   Eyes:  Positive for dryness. Negative for pain and itching.  Respiratory:  Negative for shortness of breath and difficulty breathing.   Cardiovascular:  Positive for palpitations. Negative for chest pain.  Gastrointestinal:  Positive for diarrhea. Negative for blood in stool and constipation.  Endocrine: Negative for increased urination.  Genitourinary:  Negative for difficulty urinating.  Musculoskeletal:  Positive for joint pain, joint pain, joint swelling, myalgias, morning stiffness, muscle tenderness and myalgias.  Skin:  Positive for color change. Negative for rash, redness and sensitivity to sunlight.  Allergic/Immunologic: Negative for susceptible to infections.  Neurological:  Positive for numbness, headaches and memory loss. Negative for dizziness.  Hematological:  Negative for bruising/bleeding tendency and swollen glands.  Psychiatric/Behavioral:  Positive for confusion.    PMFS History:  Patient Active Problem List   Diagnosis Date Noted   Paraesophageal hernia 01/29/2019   Hiatal hernia with GERD 06/05/2018   Absolute anemia 05/12/2018   Episodic cluster headache, not intractable 05/12/2018   Morbid obesity ([place]) 05/12/2018   Chest pain due to GERD 05/12/2018   Iron deficiency anemia 04/07/2018   Primary osteoarthritis of right hip 09/12/2017   Status post Nissen fundoplication 80/99/8338   Poor compliance with CPAP treatment 03/24/2017   Dysphonia 02/05/2017   Depression headache 03/06/2016   Clinical depression 03/06/2016   Rash and nonspecific skin eruption  01/09/2016   Bilateral knee pain 06/29/2015   Hip pain, chronic 04/05/2015   OSA on CPAP 01/30/2015   Snorings 01/30/2015   Severe obesity (BMI >= 40) ([place]) 01/30/2015   Nocturia more than twice per night 01/30/2015   Hypoxemia 01/30/2015   OSA (obstructive sleep apnea) 11/01/2014   Cognitive changes 10/12/2014   Memory change 05/12/2014   Family history of diabetes mellitus  (DM) 05/11/2014   Herniated lumbar disc without myelopathy 12/10/2013   AK (actinic keratosis) 04/16/2012   Atrial tachycardia ([place]) 06/05/2011   Bilateral bunions 12/18/2010   Metatarsalgia 12/18/2010   ANXIETY DEPRESSION 09/03/2010   ADENOCARCINOMA, THYROID GLAND, PAPILLARY 05/17/2009   ALLERGIC RHINITIS 01/30/2009   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 08/11/2007   GERD 07/14/2007   FIBROMYALGIA 07/14/2007    Past Medical History:  Diagnosis Date   Acne nodule 11/26/2011   ADENOCARCINOMA, THYROID GLAND, PAPILLARY 05/17/2009   04/2009 Two surgeries for thyroid cancer Path report from last surgery :THYROID, LEFT, HEMITHYROIDECTOMY: - MULTIPLE FOCI OF FOLLICULAR VARIANT OR PAPILLARY THYROID CARCINOMA, 1.7 CM IN GREATEST DIMENSION, CONFINED WITHIN THE THYROID TISSUE. - ANGIOLYMPHATIC INVASION PRESENT. - ISTHMUS RESECTION MARGIN INVOLVED BY TUMOR. - FOLLICULAR ADENOMA(S) WITH ASSOCIATED LYMPHOCYTIC THYROIDITIS.      AK (actinic keratosis) 04/16/2012   ALLERGIC RHINITIS 01/30/2009   Qualifier: Diagnosis of  By: Andria Frames MD, [place] 09/03/2010   Long standing depression with anxiety. Good response to medications initially. Has been to [facility].     Arthritis    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 08/11/2007   LEFT SAY:TKZSW hip replacement (Dr Berenice Primas) with recalled hardware. Had revision by Dr Berenice Primas in 2011 with  ASR .Marland Kitchen     Atrial dysrhythmia    FOLLOWED BY DR Caryl Comes WAS HAVING PALPITATIONS AND SYNCOPAL EPISODES ONSET 9 YEARS AGO ; STARTED ON FLECAIINIDE , PER PATIENT TOLERATES WELL, LAST SYNCOPAL EPISODE WAS OVER 5 YEARS AGO ; SEE LOV IN  EPIC    Atrial tachycardia (HCC) 06/05/2011   \Treated with flecainide-2011 2010-QRS duration 88 ms 2011 QRS duration 88 ms 2014 QRS duration 98 ms 2014 QRS 120mg    120 ms     Bilateral bunions 12/18/2010   Bilateral knee pain 06/29/2015   Cancer ([place])    thyroid-status post resection on replacement Dr. Chalmers Cater   Clinical  depression 03/06/2016   Cognitive changes 10/01/3233   Complication of anesthesia    FENTANYL CAUSED ANAPHYLAXIS DURING CA COLONOSCOPY   Depression    Dysrhythmia    Dr Caryl Comes monitoring patient, atrial tachycardia under control, cardiac clearance 09/10/17   Family history of diabetes mellitus (DM) 05/11/2014   Fibromyalgia    Gait abnormality 12/18/2010   GE reflux    GERD 07/14/2007   Qualifier: Diagnosis of  By: Nori Riis MD, Clarise Cruz     Headache    migraines   Headache upon awakening 06/05/2011   Herniated lumbar disc without myelopathy 12/10/2013   Had surgery by neurosurgery in December, 2014.L4-5 laminotomy and microdiscectomy Dr Margreta Journey Done at [facility]    Hip pain, chronic 04/05/2015   Status post THR plus revision.    Hypoxemia 01/30/2015   Memory change 05/12/2014   Reports problems with short-term memory, focusing, concentration in the last 6-12 months. Problems were significant enough that she had to quit her part-time job is fairly distressed about this.    Metatarsalgia 12/18/2010   Nausea & vomiting 06/05/2011  Nocturia more than twice per night 01/30/2015   Nonallopathic lesion of cervical region 11/26/2011   Nonallopathic lesion of lumbosacral region 11/26/2011   OSA on CPAP 01/30/2015   Osteoporosis    PONV (postoperative nausea and vomiting)    did not have nausea/vomiting with nissen fundoplication done 41/2878 at [facility]   Poor compliance with CPAP treatment 03/24/2017   Rash and nonspecific skin eruption 01/09/2016    located in the 10 o'clock position on Right breast     Severe obesity (BMI >= 40) ([place]) 01/30/2015   Sleep apnea 10/2015   x 1 year, CPAP   Snorings 01/30/2015   Status post Nissen fundoplication 67/02/7208   Subacute cough 03/27/2012   Thyroid disease    Well adult health check 06/05/2011    Family History  Problem Relation Age of Onset   Alcohol abuse Mother    Cancer Mother 20       Lung    Alcohol abuse Father    Gout Father    Breast  cancer Sister    Cancer Other    Healthy Son    Healthy Son    Healthy Daughter    Hypertension Daughter    Past Surgical History:  Procedure Laterality Date   ABDOMINAL HYSTERECTOMY     BREAST EXCISIONAL BIOPSY Right 25+ yrs ago   benign   caroitid dopplers Bilateral 11/2015   normal   CATARACT EXTRACTION Bilateral 09/05/15, 09/12/15   CHOLECYSTECTOMY     COLONOSCOPY  04/2011   ESOPHAGEAL MANOMETRY N/A 03/31/2017   Procedure: ESOPHAGEAL MANOMETRY (EM);  Surgeon: Ronald Lobo, MD;  Location: [facility];  Service: Endoscopy;  Laterality: N/A;   HIATAL HERNIA REPAIR N/A 06/05/2018   Procedure: LAPAROSCOPIC  ENTERO-LYSIS  ERAS PATHWAY;  Surgeon: Johnathan Hausen, MD;  Location: [facility];  Service: General;  Laterality: N/A;   LAPAROSCOPIC NISSEN FUNDOPLICATION N/A 47/0/9628   Procedure: LAPAROSCOPIC NISSEN FUNDOPLICATION;  Surgeon: Johnathan Hausen, MD;  Location: [facility];  Service: General;  Laterality: N/A;   THYROIDECTOMY  04/2009   papillary carcimao Dr. Erik Obey   TOE SURGERY     bone spur removed - rt great toe   TOTAL HIP ARTHROPLASTY  2009 and 2011   L hip    TOTAL HIP ARTHROPLASTY Right 09/12/2017   Procedure: TOTAL HIP ARTHROPLASTY ANTERIOR APPROACH;  Surgeon: Dorna Leitz, MD;  Location: [facility];  Service: Orthopedics;  Laterality: Right;   TUBAL LIGATION     UPPER GI ENDOSCOPY  06/05/2018   Procedure: UPPER GI ENDOSCOPY;  Surgeon: Johnathan Hausen, MD;  Location: [facility];  Service: General;;   Social History   Social History Narrative   Patient lives at home alone    Patient is married    Patient is not working    Patient has 3 children    Patient has her BSN RN   Patient is right handed       Daily caffeine use 1 cup daily.   Immunization History  Administered Date(s) Administered   Influenza Split 06/24/2012   Influenza, High Dose Seasonal PF 06/08/2018   Influenza,inj,Quad PF,6+ Mos 09/06/2014   PFIZER(Purple Top)SARS-COV-2 Vaccination 11/19/2019, 12/14/2019,  06/03/2020   Pneumococcal Polysaccharide-23 11/03/2017   Tdap 05/06/2011, 01/23/2019     Objective: Vital Signs: BP 110/74 (BP Location: Right Arm, Patient Position: Sitting, Cuff Size: Normal)   Pulse 71   Ht 5' 6.25" (1.683 m)   Wt 189 lb (85.7 kg)   BMI 30.28 kg/m    Physical  Exam Vitals and nursing note reviewed.  Constitutional:      Appearance: She is well-developed.  HENT:     Head: Normocephalic and atraumatic.  Eyes:     Conjunctiva/sclera: Conjunctivae normal.  Cardiovascular:     Rate and Rhythm: Normal rate and regular rhythm.     Heart sounds: Normal heart sounds.  Pulmonary:     Effort: Pulmonary effort is normal.     Breath sounds: Normal breath sounds.  Abdominal:     General: Bowel sounds are normal.     Palpations: Abdomen is soft.  Musculoskeletal:     Cervical back: Normal range of motion.  Lymphadenopathy:     Cervical: No cervical adenopathy.  Skin:    General: Skin is warm and dry.     Capillary Refill: Capillary refill takes less than 2 seconds.  Neurological:     Mental Status: She is alert and oriented to person, place, and time.  Psychiatric:        Behavior: Behavior normal.     Musculoskeletal Exam: C-spine was in good range of motion.  She is some discomfort range of motion of lumbar spine.  Shoulder joints, elbow joints, wrist joints with good range of motion.  She had bilateral CMC subluxation.  She had bilateral PIP and DIP thickening.  No synovitis was noted.  She had right middle trigger finger.  Hip joints were in good range of motion.  Both hip joints are replaced.  Knee joints were in good range of motion without upon swelling or effusion.  She had limited flexion of her right knee joint.  She had postsurgical changes in her bilateral feet due to osteoarthritis.  CDAI Exam: CDAI Score: -- Patient Global: --; Provider Global: -- Swollen: --; Tender: -- Joint Exam 03/19/2021   No joint exam has been documented for this visit    There is currently no information documented on the homunculus. Go to the Rheumatology activity and complete the homunculus joint exam.  Investigation: No additional findings.  Imaging: No results found.  Recent Labs: Lab Results  Component Value Date   WBC 5.2 12/05/2019   HGB 12.4 12/05/2019   PLT 210 12/05/2019   NA 140 12/05/2019   K 3.3 (L) 12/05/2019   CL 106 12/05/2019   CO2 26 12/05/2019   GLUCOSE 102 (H) 12/05/2019   BUN 18 12/05/2019   CREATININE 0.64 12/05/2019   BILITOT 0.4 07/15/2019   ALKPHOS 79 07/15/2019   AST 18 07/15/2019   ALT 13 07/15/2019   PROT 6.7 07/15/2019   ALBUMIN 4.2 07/15/2019   CALCIUM 8.5 (L) 12/05/2019   GFRAA >60 12/05/2019    Speciality Comments: No specialty comments available.  Procedures:  No procedures performed Allergies: Nsaids and Codeine   Assessment / Plan:     Visit Diagnoses: Primary osteoarthritis, right shoulder-patient gives history of osteoarthritis and right shoulder joint.  She has had cortisone injection by Dr.Graves 3 months ago which was helpful.  Pain in both hands -she gives history of severe pain and swelling in her right hand about a month ago.  She states the symptoms lasted for a month and then eventually resolved.  She took Tylenol for pain.  She continues to have some discomfort in her hands.  Clinical findings are consistent with osteoarthritis.  I will obtain labs to look for any  inflammatory or autoimmune process.  Plan: Sedimentation rate, Rheumatoid factor, Cyclic citrul peptide antibody, IgG, Uric acid, ANA.  I will refer her to occupational  therapy due to severe osteoarthritis in her hands.  A handout on hand exercises was given.  Primary osteoarthritis of both hands - Plan: XR Hand 2 View Right, XR Hand 2 View Left.  X-ray showed bilateral severe osteoarthritis with bilateral CMC subluxation.  Trigger finger, right middle finger-she complains of right middle trigger finger.  She had flexor tendon  thickening.  Status post bilateral total hip replacement-she had bilateral total hip replacement by Dr. Berenice Primas.  Patient gives history of AVN in her left hip in the past.  She had good range of motion of bilateral hip joints.  Chronic pain of right knee -she gives history of discomfort in her right knee joint.  She had limited flexion of her right knee.  Plan: XR KNEE 3 VIEW RIGHT. X-rays were consistent with mild chondromalacia patella of the knee.  A handout on knee exercises was given.  Primary osteoarthritis of both feet - Status post bilateral bunionectomy.  Patient has osteoarthritic changes in her feet.  DDD (degenerative disc disease), cervical-she has some stiffness in her neck off and on.  I reviewed a CT scan of her cervical spine from the past which showed degenerative changes.  DDD (degenerative disc disease), lumbar - Status post discectomy and currently asymptomatic.  Raynaud's phenomenon without gangrene-she gives history of Raynauds symptoms during the winter months.  Other medical problems are listed as follows:  Chronic pain syndrome  Fibromyalgia  Atrial tachycardia (HCC)  Status post Nissen fundoplication  ADENOCARCINOMA, THYROID GLAND, PAPILLARY  Episodic cluster headache, not intractable  History of depression  Orders: Orders Placed This Encounter  Procedures   XR Hand 2 View Right   XR Hand 2 View Left   XR KNEE 3 VIEW RIGHT   Sedimentation rate   Rheumatoid factor   Cyclic citrul peptide antibody, IgG   Uric acid   ANA    No orders of the defined types were placed in this encounter.    Follow-Up Instructions: Return for Osteoarthritis.   Bo Merino, MD  Note - This record has been created using Editor, commissioning.  Chart creation errors have been sought, but may not always  have been located. Such creation errors do not reflect on  the standard of medical care.

## 2021-03-19 ENCOUNTER — Ambulatory Visit (INDEPENDENT_AMBULATORY_CARE_PROVIDER_SITE_OTHER): Payer: Medicare HMO | Admitting: Rheumatology

## 2021-03-19 ENCOUNTER — Ambulatory Visit: Payer: Self-pay

## 2021-03-19 ENCOUNTER — Other Ambulatory Visit: Payer: Self-pay

## 2021-03-19 ENCOUNTER — Encounter: Payer: Self-pay | Admitting: Rheumatology

## 2021-03-19 VITALS — BP 110/74 | HR 71 | Ht 66.25 in | Wt 189.0 lb

## 2021-03-19 DIAGNOSIS — G894 Chronic pain syndrome: Secondary | ICD-10-CM

## 2021-03-19 DIAGNOSIS — M797 Fibromyalgia: Secondary | ICD-10-CM

## 2021-03-19 DIAGNOSIS — M19041 Primary osteoarthritis, right hand: Secondary | ICD-10-CM

## 2021-03-19 DIAGNOSIS — I7300 Raynaud's syndrome without gangrene: Secondary | ICD-10-CM

## 2021-03-19 DIAGNOSIS — M19071 Primary osteoarthritis, right ankle and foot: Secondary | ICD-10-CM | POA: Diagnosis not present

## 2021-03-19 DIAGNOSIS — M65331 Trigger finger, right middle finger: Secondary | ICD-10-CM

## 2021-03-19 DIAGNOSIS — M25561 Pain in right knee: Secondary | ICD-10-CM | POA: Diagnosis not present

## 2021-03-19 DIAGNOSIS — M5136 Other intervertebral disc degeneration, lumbar region: Secondary | ICD-10-CM | POA: Diagnosis not present

## 2021-03-19 DIAGNOSIS — Z96643 Presence of artificial hip joint, bilateral: Secondary | ICD-10-CM | POA: Diagnosis not present

## 2021-03-19 DIAGNOSIS — M5030 Other cervical disc degeneration, unspecified cervical region: Secondary | ICD-10-CM | POA: Diagnosis not present

## 2021-03-19 DIAGNOSIS — M79642 Pain in left hand: Secondary | ICD-10-CM

## 2021-03-19 DIAGNOSIS — G8929 Other chronic pain: Secondary | ICD-10-CM | POA: Diagnosis not present

## 2021-03-19 DIAGNOSIS — M79641 Pain in right hand: Secondary | ICD-10-CM | POA: Diagnosis not present

## 2021-03-19 DIAGNOSIS — C73 Malignant neoplasm of thyroid gland: Secondary | ICD-10-CM

## 2021-03-19 DIAGNOSIS — M19042 Primary osteoarthritis, left hand: Secondary | ICD-10-CM

## 2021-03-19 DIAGNOSIS — M19072 Primary osteoarthritis, left ankle and foot: Secondary | ICD-10-CM

## 2021-03-19 DIAGNOSIS — M19011 Primary osteoarthritis, right shoulder: Secondary | ICD-10-CM | POA: Diagnosis not present

## 2021-03-19 DIAGNOSIS — I471 Supraventricular tachycardia: Secondary | ICD-10-CM

## 2021-03-19 DIAGNOSIS — Z9889 Other specified postprocedural states: Secondary | ICD-10-CM

## 2021-03-19 DIAGNOSIS — Z8659 Personal history of other mental and behavioral disorders: Secondary | ICD-10-CM

## 2021-03-19 DIAGNOSIS — G44019 Episodic cluster headache, not intractable: Secondary | ICD-10-CM

## 2021-03-19 NOTE — Patient Instructions (Signed)
Journal for Nurse Practitioners, 15(4), 263-267. Retrieved June 29, 2018 from [email or web address]">  Knee Exercises Ask your health care provider which exercises are safe for you. Do exercises exactly as told by your health care provider and adjust them as directed. It is normal to feel mild stretching, pulling, tightness, or discomfort as you do these exercises. Stop right away if you feel sudden pain or your pain gets worse. Do not begin these exercises until told by your health care provider. Stretching and range-of-motion exercises These exercises warm up your muscles and joints and improve the movement and flexibility of your knee. These exercises also help to relieve pain andswelling. Knee extension, prone Lie on your abdomen (prone position) on a bed. Place your left / right knee just beyond the edge of the surface so your knee is not on the bed. You can put a towel under your left / right thigh just above your kneecap for comfort. Relax your leg muscles and allow gravity to straighten your knee (extension). You should feel a stretch behind your left / right knee. Hold this position for __________ seconds. Scoot up so your knee is supported between repetitions. Repeat __________ times. Complete this exercise __________ times a day. Knee flexion, active  Lie on your back with both legs straight. If this causes back discomfort, bend your left / right knee so your foot is flat on the floor. Slowly slide your left / right heel back toward your buttocks. Stop when you feel a gentle stretch in the front of your knee or thigh (flexion). Hold this position for __________ seconds. Slowly slide your left / right heel back to the starting position. Repeat __________ times. Complete this exercise __________ times a day. Quadriceps stretch, prone  Lie on your abdomen on a firm surface, such as a bed or padded floor. Bend your left / right knee and hold your ankle. If you cannot reach  your ankle or pant leg, loop a belt around your foot and grab the belt instead. Gently pull your heel toward your buttocks. Your knee should not slide out to the side. You should feel a stretch in the front of your thigh and knee (quadriceps). Hold this position for __________ seconds. Repeat __________ times. Complete this exercise __________ times a day. Hamstring, supine Lie on your back (supine position). Loop a belt or towel over the ball of your left / right foot. The ball of your foot is on the walking surface, right under your toes. Straighten your left / right knee and slowly pull on the belt to raise your leg until you feel a gentle stretch behind your knee (hamstring). Do not let your knee bend while you do this. Keep your other leg flat on the floor. Hold this position for __________ seconds. Repeat __________ times. Complete this exercise __________ times a day. Strengthening exercises These exercises build strength and endurance in your knee. Endurance is theability to use your muscles for a long time, even after they get tired. Quadriceps, isometric This exercise stretches the muscles in front of your thigh (quadriceps) without moving your knee joint (isometric). Lie on your back with your left / right leg extended and your other knee bent. Put a rolled towel or small pillow under your knee if told by your health care provider. Slowly tense the muscles in the front of your left / right thigh. You should see your kneecap slide up toward your hip or see increased dimpling just above the knee. This motion will   push the back of the knee toward the floor. For __________ seconds, hold the muscle as tight as you can without increasing your pain. Relax the muscles slowly and completely. Repeat __________ times. Complete this exercise __________ times a day. Straight leg raises This exercise stretches the muscles in front of your thigh (quadriceps) and the muscles that move your hips (hip  flexors). Lie on your back with your left / right leg extended and your other knee bent. Tense the muscles in the front of your left / right thigh. You should see your kneecap slide up or see increased dimpling just above the knee. Your thigh may even shake a bit. Keep these muscles tight as you raise your leg 4-6 inches (10-15 cm) off the floor. Do not let your knee bend. Hold this position for __________ seconds. Keep these muscles tense as you lower your leg. Relax your muscles slowly and completely after each repetition. Repeat __________ times. Complete this exercise __________ times a day. Hamstring, isometric Lie on your back on a firm surface. Bend your left / right knee about __________ degrees. Dig your left / right heel into the surface as if you are trying to pull it toward your buttocks. Tighten the muscles in the back of your thighs (hamstring) to "dig" as hard as you can without increasing any pain. Hold this position for __________ seconds. Release the tension gradually and allow your muscles to relax completely for __________ seconds after each repetition. Repeat __________ times. Complete this exercise __________ times a day. Hamstring curls If told by your health care provider, do this exercise while wearing ankle weights. Begin with __________ lb weights. Then increase the weight by 1 lb (0.5 kg) increments. Do not wear ankle weights that are more than __________ lb. Lie on your abdomen with your legs straight. Bend your left / right knee as far as you can without feeling pain. Keep your hips flat against the floor. Hold this position for __________ seconds. Slowly lower your leg to the starting position. Repeat __________ times. Complete this exercise __________ times a day. Squats This exercise strengthens the muscles in front of your thigh and knee (quadriceps). Stand in front of a table, with your feet and knees pointing straight ahead. You may rest your hands on the  table for balance but not for support. Slowly bend your knees and lower your hips like you are going to sit in a chair. Keep your weight over your heels, not over your toes. Keep your lower legs upright so they are parallel with the table legs. Do not let your hips go lower than your knees. Do not bend lower than told by your health care provider. If your knee pain increases, do not bend as low. Hold the squat position for __________ seconds. Slowly push with your legs to return to standing. Do not use your hands to pull yourself to standing. Repeat __________ times. Complete this exercise __________ times a day. Wall slides This exercise strengthens the muscles in front of your thigh and knee (quadriceps). Lean your back against a smooth wall or door, and walk your feet out 18-24 inches (46-61 cm) from it. Place your feet hip-width apart. Slowly slide down the wall or door until your knees bend __________ degrees. Keep your knees over your heels, not over your toes. Keep your knees in line with your hips. Hold this position for __________ seconds. Repeat __________ times. Complete this exercise __________ times a day. Straight leg raises This exercise   strengthens the muscles that rotate the leg at the hip and move it away from your body (hip abductors). Lie on your side with your left / right leg in the top position. Lie so your head, shoulder, knee, and hip line up. You may bend your bottom knee to help you keep your balance. Roll your hips slightly forward so your hips are stacked directly over each other and your left / right knee is facing forward. Leading with your heel, lift your top leg 4-6 inches (10-15 cm). You should feel the muscles in your outer hip lifting. Do not let your foot drift forward. Do not let your knee roll toward the ceiling. Hold this position for __________ seconds. Slowly return your leg to the starting position. Let your muscles relax completely after each  repetition. Repeat __________ times. Complete this exercise __________ times a day. Straight leg raises This exercise stretches the muscles that move your hips away from the front of the pelvis (hip extensors). Lie on your abdomen on a firm surface. You can put a pillow under your hips if that is more comfortable. Tense the muscles in your buttocks and lift your left / right leg about 4-6 inches (10-15 cm). Keep your knee straight as you lift your leg. Hold this position for __________ seconds. Slowly lower your leg to the starting position. Let your leg relax completely after each repetition. Repeat __________ times. Complete this exercise __________ times a day. This information is not intended to replace advice given to you by your health care provider. Make sure you discuss any questions you have with your healthcare provider. Document Revised: 06/30/2018 Document Reviewed: 06/30/2018 [employer] Patient Education  2022 [employer] Hand Exercises Hand exercises can be helpful for almost anyone. These exercises can strengthen the hands, improve flexibility and movement, and increase blood flow to the hands. These results can make work and daily tasks easier. Hand exercises can be especially helpful for people who have joint pain from arthritis or have nerve damage from overuse (carpal tunnel syndrome). These exercises can also help people who have injured a hand. Exercises Most of these hand exercises are gentle stretching and motion exercises. It is usually safe to do them often throughout the day. Warming up your hands before exercise may help to reduce stiffness. You can do this with gentle massage orby placing your hands in warm water for 10-15 minutes. It is normal to feel some stretching, pulling, tightness, or mild discomfort as you begin new exercises. This will gradually improve. Stop an exercise right away if you feel sudden, severe pain or your pain gets worse. Ask your healthcare  provider which exercises are best for you. Knuckle bend or "claw" fist Stand or sit with your arm, hand, and all five fingers pointed straight up. Make sure to keep your wrist straight during the exercise. Gently bend your fingers down toward your palm until the tips of your fingers are touching the top of your palm. Keep your big knuckle straight and just bend the small knuckles in your fingers. Hold this position for __________ seconds. Straighten (extend) your fingers back to the starting position. Repeat this exercise 5-10 times with each hand. Full finger fist Stand or sit with your arm, hand, and all five fingers pointed straight up. Make sure to keep your wrist straight during the exercise. Gently bend your fingers into your palm until the tips of your fingers are touching the middle of your palm. Hold this position for __________ seconds.   Extend your fingers back to the starting position, stretching every joint fully. Repeat this exercise 5-10 times with each hand. Straight fist Stand or sit with your arm, hand, and all five fingers pointed straight up. Make sure to keep your wrist straight during the exercise. Gently bend your fingers at the big knuckle, where your fingers meet your hand, and the middle knuckle. Keep the knuckle at the tips of your fingers straight and try to touch the bottom of your palm. Hold this position for __________ seconds. Extend your fingers back to the starting position, stretching every joint fully. Repeat this exercise 5-10 times with each hand. Tabletop Stand or sit with your arm, hand, and all five fingers pointed straight up. Make sure to keep your wrist straight during the exercise. Gently bend your fingers at the big knuckle, where your fingers meet your hand, as far down as you can while keeping the small knuckles in your fingers straight. Think of forming a tabletop with your fingers. Hold this position for __________ seconds. Extend your fingers  back to the starting position, stretching every joint fully. Repeat this exercise 5-10 times with each hand. Finger spread Place your hand flat on a table with your palm facing down. Make sure your wrist stays straight as you do this exercise. Spread your fingers and thumb apart from each other as far as you can until you feel a gentle stretch. Hold this position for __________ seconds. Bring your fingers and thumb tight together again. Hold this position for __________ seconds. Repeat this exercise 5-10 times with each hand. Making circles Stand or sit with your arm, hand, and all five fingers pointed straight up. Make sure to keep your wrist straight during the exercise. Make a circle by touching the tip of your thumb to the tip of your index finger. Hold for __________ seconds. Then open your hand wide. Repeat this motion with your thumb and each finger on your hand. Repeat this exercise 5-10 times with each hand. Thumb motion Sit with your forearm resting on a table and your wrist straight. Your thumb should be facing up toward the ceiling. Keep your fingers relaxed as you move your thumb. Lift your thumb up as high as you can toward the ceiling. Hold for __________ seconds. Bend your thumb across your palm as far as you can, reaching the tip of your thumb for the small finger (pinkie) side of your palm. Hold for __________ seconds. Repeat this exercise 5-10 times with each hand. Grip strengthening  Hold a stress ball or other soft ball in the middle of your hand. Slowly increase the pressure, squeezing the ball as much as you can without causing pain. Think of bringing the tips of your fingers into the middle of your palm. All of your finger joints should bend when doing this exercise. Hold your squeeze for __________ seconds, then relax. Repeat this exercise 5-10 times with each hand. Contact a health care provider if: Your hand pain or discomfort gets much worse when you do an  exercise. Your hand pain or discomfort does not improve within 2 hours after you exercise. If you have any of these problems, stop doing these exercises right away. Do not do them again unless your health care provider says that you can. Get help right away if: You develop sudden, severe hand pain or swelling. If this happens, stop doing these exercises right away. Do not do them again unless your health care provider says that you can. This   information is not intended to replace advice given to you by your health care provider. Make sure you discuss any questions you have with your healthcare provider. Document Revised: 12/31/2018 Document Reviewed: 09/10/2018 [employer] Patient Education  2022 [employer]  

## 2021-03-21 LAB — URIC ACID: Uric Acid, Serum: 4.1 mg/dL (ref 2.5–7.0)

## 2021-03-21 LAB — SEDIMENTATION RATE: Sed Rate: 6 mm/h (ref 0–30)

## 2021-03-21 LAB — RHEUMATOID FACTOR: Rheumatoid fact SerPl-aCnc: <14 IU/mL (ref <14)

## 2021-03-21 LAB — ANA: Anti Nuclear Antibody (ANA): NEGATIVE

## 2021-03-21 LAB — CYCLIC CITRUL PEPTIDE ANTIBODY, IGG: Cyclic Citrullin Peptide Ab: <16 UNITS

## 2021-03-27 ENCOUNTER — Other Ambulatory Visit: Payer: Self-pay

## 2021-03-27 ENCOUNTER — Ambulatory Visit (INDEPENDENT_AMBULATORY_CARE_PROVIDER_SITE_OTHER): Payer: Medicare HMO | Admitting: Rheumatology

## 2021-03-27 ENCOUNTER — Ambulatory Visit: Payer: Self-pay

## 2021-03-27 DIAGNOSIS — M65331 Trigger finger, right middle finger: Secondary | ICD-10-CM

## 2021-03-27 MED ORDER — LIDOCAINE HCL 1 % IJ SOLN
0.5000 mL | INTRAMUSCULAR | Status: AC | PRN
  Administered 2021-03-27: .5 mL

## 2021-03-27 MED ORDER — TRIAMCINOLONE ACETONIDE 40 MG/ML IJ SUSP
10.0000 mg | INTRAMUSCULAR | Status: AC | PRN
  Administered 2021-03-27: 10 mg

## 2021-03-27 NOTE — Progress Notes (Signed)
   Procedure Note  Patient: Rebecca Marshall             Date of Birth: 1950-12-02           MRN: [id number]             Visit Date: 03/27/2021  Procedures: Visit Diagnoses:  1. Trigger finger, right middle finger    Patient has been experiencing her right middle trigger finger.  She has tried topical analgesics and Tylenol without much relief.  Different treatment options and their side effects were discussed.  Patient wants to proceed with cortisone injection.  Ultrasound guided injection is preferred based studies that show increased duration, increased effect, greater accuracy, decreased procedural pain, increased response rate, and decreased cost with ultrasound guided versus blind injection.   Verbal informed consent obtained.  Time-out conducted.  Noted no overlying erythema, induration, or other signs of local infection. Ultrasound-guided right middle trigger finger injection: After sterile prep with Betadine, injected 0.5 mL of 1% lidocaine and 10 mg Kenalog using a 27-gauge needle needle, was approached into the right middle finger flexor tendon sheath under ultrasound guidance.    Hand/UE Inj: R long A1 for trigger finger on 03/27/2021 2:12 PM Indications: pain, tendon swelling and therapeutic Details: 27 G needle, ultrasound-guided volar approach Medications: 0.5 mL lidocaine 1 %; 10 mg triamcinolone acetonide 40 MG/ML Aspirate: 0 mL Procedure, treatment alternatives, risks and benefits explained, specific risks discussed. Immediately prior to procedure a time out was called to verify the correct patient, procedure, equipment, support staff and site/side marked as required. Patient was prepped and draped in the usual sterile fashion.   Postprocedure instructions were provided.  A splint was applied.  Bo Merino, MD

## 2021-04-04 ENCOUNTER — Other Ambulatory Visit (HOSPITAL_COMMUNITY): Payer: Self-pay

## 2021-04-04 MED ORDER — LEVOTHYROXINE SODIUM 150 MCG PO TABS
ORAL_TABLET | ORAL | 4 refills | Status: DC
  Filled 2021-04-04: qty 90, 90d supply, fill #0
  Filled 2021-07-11: qty 90, 90d supply, fill #1
  Filled 2021-10-08: qty 90, 90d supply, fill #2

## 2021-04-04 MED ORDER — LEVOTHYROXINE SODIUM 150 MCG PO TABS: ORAL_TABLET | ORAL | 0 refills | Status: DC

## 2021-04-09 ENCOUNTER — Ambulatory Visit: Payer: Medicare HMO | Admitting: Occupational Therapy

## 2021-04-19 ENCOUNTER — Ambulatory Visit: Payer: Medicare HMO | Admitting: Rheumatology

## 2021-04-25 ENCOUNTER — Other Ambulatory Visit (HOSPITAL_COMMUNITY): Payer: Self-pay

## 2021-04-25 ENCOUNTER — Telehealth: Payer: Self-pay | Admitting: Cardiology

## 2021-04-25 NOTE — Telephone Encounter (Signed)
*  STAT* If patient is at the pharmacy, call can be transferred to refill team.   1. Which medications need to be refilled? (please list name of each medication and dose if known) flecainide (TAMBOCOR) 150 MG tablet  2. Which pharmacy/location (including street and city if local pharmacy) is medication to be sent to? [facility]  3. Do they need a 30 day or 90 day supply?  90 day supply   Only has two tablets left.

## 2021-04-26 ENCOUNTER — Other Ambulatory Visit (HOSPITAL_COMMUNITY): Payer: Self-pay

## 2021-04-26 ENCOUNTER — Encounter: Payer: Self-pay | Admitting: Oncology

## 2021-04-26 ENCOUNTER — Other Ambulatory Visit: Payer: Self-pay | Admitting: Cardiology

## 2021-04-26 MED ORDER — FLECAINIDE ACETATE 150 MG PO TABS
150.0000 mg | ORAL_TABLET | Freq: Two times a day (BID) | ORAL | 3 refills | Status: DC
  Filled 2021-04-26: qty 180, 90d supply, fill #0

## 2021-04-30 ENCOUNTER — Other Ambulatory Visit (HOSPITAL_COMMUNITY): Payer: Self-pay

## 2021-05-01 ENCOUNTER — Other Ambulatory Visit (HOSPITAL_COMMUNITY): Payer: Self-pay

## 2021-05-25 ENCOUNTER — Other Ambulatory Visit (HOSPITAL_COMMUNITY): Payer: Self-pay

## 2021-05-25 ENCOUNTER — Encounter: Payer: Self-pay | Admitting: Oncology

## 2021-05-25 DIAGNOSIS — M549 Dorsalgia, unspecified: Secondary | ICD-10-CM | POA: Diagnosis not present

## 2021-05-25 MED ORDER — CYCLOBENZAPRINE HCL 10 MG PO TABS
ORAL_TABLET | ORAL | 0 refills | Status: DC
  Filled 2021-05-25: qty 30, 30d supply, fill #0

## 2021-05-30 ENCOUNTER — Other Ambulatory Visit (HOSPITAL_COMMUNITY): Payer: Self-pay

## 2021-06-01 DIAGNOSIS — R0789 Other chest pain: Secondary | ICD-10-CM | POA: Diagnosis not present

## 2021-06-01 DIAGNOSIS — M898X1 Other specified disorders of bone, shoulder: Secondary | ICD-10-CM | POA: Diagnosis not present

## 2021-07-11 ENCOUNTER — Other Ambulatory Visit (HOSPITAL_COMMUNITY): Payer: Self-pay

## 2021-07-11 NOTE — Progress Notes (Signed)
HPI: Follow-up atrial tachycardia, reported history of bradycardia, neurally mediated syncope, history of bradycardia and obstructive sleep apnea. Last echocardiogram March 2017 showed normal LV function, grade 2 diastolic dysfunction, trace aortic insufficiency, moderate left atrial enlargement and mild right atrial enlargement.  Carotid Dopplers March 2017 normal.  Nuclear study March 2017 showed ejection fraction 72%, breast attenuation but no ischemia.  Abdominal ultrasound November 2021 showed no abdominal aortic aneurysm, mildly dilated left iliac at 1.6 cm.  Since last seen she denies dyspnea on exertion, orthopnea, PND, pedal edema, chest pain or syncope.  Occasional palpitations not particular bothersome.  Current Outpatient Medications  Medication Sig Dispense Refill   acetaminophen (TYLENOL) 500 MG tablet Take 1,000 mg by mouth every 8 (eight) hours as needed for mild pain or moderate pain.      colestipol (COLESTID) 1 g tablet Take 1 g by mouth in the morning and at bedtime.     cyclobenzaprine (FLEXERIL) 10 MG tablet Take 1 tablet by mouth at bedtime as needed 30 tablet 0   cyclobenzaprine (FLEXERIL) 5 MG tablet Take 1 to 2 tablets by mouth at bedtime as needed 60 tablet 0   diazepam (VALIUM) 10 MG tablet Take 10 mg by mouth daily as needed.     eletriptan (RELPAX) 40 MG tablet Take by mouth one at the beginning of headache and may repeat once in 2  Hours for maximum of 2 tabs in 24hours 27 tablet 3   flecainide (TAMBOCOR) 150 MG tablet Take 1 tablet  by mouth 2  times daily. Please make yearly appt with Dr. Caryl Comes for October for future refills. 1st attempt 180 tablet 3   levothyroxine (SYNTHROID) 150 MCG tablet Take 150 mcg by mouth daily before breakfast.     levothyroxine (SYNTHROID) 150 MCG tablet TAKE 1 TABLET BY MOUTH EVERY MORNING ON AN EMPTY STOMACH 90 tablet 6   levothyroxine (SYNTHROID) 150 MCG tablet Take 1 tablet by mouth every morning on an empty stomach. 90 tablet 0    levothyroxine (SYNTHROID) 150 MCG tablet Take 1 tablet by mouth once daily in the morning on an empty stomach 90 tablet 4   levothyroxine (SYNTHROID) 150 MCG tablet Take 1 tablet by mouth once daily in the morning on an empty stomach 90 tablet 0   methocarbamol (ROBAXIN) 500 MG tablet Take 1 tablet by mouth every six hours as needed for muscle tension/spasms during the day 60 tablet 1   Pancrelipase, Lip-Prot-Amyl, (CREON PO) Take by mouth 3 (three) times daily. Pt takes 2 tablets with each meal.     sertraline (ZOLOFT) 100 MG tablet Take 200 mg by mouth daily.     sertraline (ZOLOFT) 100 MG tablet Take 2 tablets by mouth once daily. 180 tablet 1   traZODone (DESYREL) 100 MG tablet Take 300 mg by mouth at bedtime.     CREON 36000-114000 units CPEP capsule Take 2 capsules by mouth 3 times daily with meals. 540 capsule 3   cycloSPORINE (RESTASIS) 0.05 % ophthalmic emulsion Instill 1 drop into both eyes twice a day as directed (Patient not taking: Reported on 07/17/2021) 5.5 mL 4   Melatonin 10 MG TABS Take 10 mg by mouth at bedtime as needed (for sleep.).     No current facility-administered medications for this visit.     Past Medical History:  Diagnosis Date   Acne nodule 11/26/2011   ADENOCARCINOMA, THYROID GLAND, PAPILLARY 05/17/2009   04/2009 Two surgeries for thyroid cancer Path report from  last surgery :THYROID, LEFT, HEMITHYROIDECTOMY: - MULTIPLE FOCI OF FOLLICULAR VARIANT OR PAPILLARY THYROID CARCINOMA, 1.7 CM IN GREATEST DIMENSION, CONFINED WITHIN THE THYROID TISSUE. - ANGIOLYMPHATIC INVASION PRESENT. - ISTHMUS RESECTION MARGIN INVOLVED BY TUMOR. - FOLLICULAR ADENOMA(S) WITH ASSOCIATED LYMPHOCYTIC THYROIDITIS.      AK (actinic keratosis) 04/16/2012   ALLERGIC RHINITIS 01/30/2009   Qualifier: Diagnosis of  By: Andria Frames MD, [place] 09/03/2010   Long standing depression with anxiety. Good response to medications initially. Has been to [facility].     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 Had revision by Dr Berenice Primas in 2011 with  ASR .Marland Kitchen     Atrial dysrhythmia    FOLLOWED BY DR Caryl Comes WAS HAVING PALPITATIONS AND SYNCOPAL EPISODES ONSET 9 YEARS AGO ; STARTED ON FLECAIINIDE , PER PATIENT TOLERATES WELL, LAST SYNCOPAL EPISODE WAS OVER 5 YEARS AGO ; SEE LOV IN  EPIC    Atrial tachycardia (HCC) 06/05/2011   \Treated with flecainide-2011 2010-QRS duration 88 ms 2011 QRS duration 88 ms 2014 QRS duration 98 ms 2014 QRS 120mg    120 ms     Bilateral bunions 12/18/2010   Bilateral knee pain 06/29/2015   Cancer ([place])    thyroid-status post resection on replacement Dr. Chalmers Cater   Clinical depression 03/06/2016   Cognitive changes 4/48/1856   Complication of anesthesia    FENTANYL CAUSED ANAPHYLAXIS DURING CA COLONOSCOPY   Depression    Dysrhythmia    Dr Caryl Comes monitoring patient, atrial tachycardia under control, cardiac clearance 09/10/17   Family history of diabetes mellitus (DM) 05/11/2014   Fibromyalgia    Gait abnormality 12/18/2010   GE reflux    GERD 07/14/2007   Qualifier: Diagnosis of  By: Nori Riis MD, Clarise Cruz     Headache    migraines   Headache upon awakening 06/05/2011   Herniated lumbar disc without myelopathy 12/10/2013   Had surgery by neurosurgery in December, 2014.L4-5 laminotomy and microdiscectomy Dr Margreta Journey Done at [facility] specialty surgical center    Hip pain, chronic 04/05/2015   Status post THR plus revision.    Hypoxemia 01/30/2015   Memory change 05/12/2014   Reports problems with short-term memory, focusing, concentration in the last 6-12 months. Problems were significant enough that she had to quit her part-time job is fairly distressed about this.    Metatarsalgia 12/18/2010   Nausea & vomiting 06/05/2011   Nocturia more than twice per night 01/30/2015   Nonallopathic lesion of cervical region 11/26/2011   Nonallopathic lesion of lumbosacral region 11/26/2011   OSA  on CPAP 01/30/2015   Osteoporosis    PONV (postoperative nausea and vomiting)    did not have nausea/vomiting with nissen fundoplication done 31/4970 at [facility]   Poor compliance with CPAP treatment 03/24/2017   Rash and nonspecific skin eruption 01/09/2016    located in the 10 o'clock position on Right breast     Severe obesity (BMI >= 40) ([place]) 01/30/2015   Sleep apnea 10/2015   x 1 year, CPAP   Snorings 01/30/2015   Status post Nissen fundoplication 26/11/7856   Subacute cough 03/27/2012   Thyroid disease    Well adult health check 06/05/2011    Past Surgical History:  Procedure Laterality Date   ABDOMINAL HYSTERECTOMY     BREAST EXCISIONAL BIOPSY Right 25+ yrs ago   benign   caroitid dopplers Bilateral  11/2015   normal   CATARACT EXTRACTION Bilateral 09/05/15, 09/12/15   CHOLECYSTECTOMY     COLONOSCOPY  04/2011   ESOPHAGEAL MANOMETRY N/A 03/31/2017   Procedure: ESOPHAGEAL MANOMETRY (EM);  Surgeon: Ronald Lobo, MD;  Location: [facility];  Service: Endoscopy;  Laterality: N/A;   HIATAL HERNIA REPAIR N/A 06/05/2018   Procedure: LAPAROSCOPIC  ENTERO-LYSIS  ERAS PATHWAY;  Surgeon: Johnathan Hausen, MD;  [facility]: [facility];  Service: General;  Laterality: N/A;   LAPAROSCOPIC NISSEN FUNDOPLICATION N/A 25/04/5276   Procedure: LAPAROSCOPIC NISSEN FUNDOPLICATION;  Surgeon: Johnathan Hausen, MD;  [facility]: [facility];  Service: General;  Laterality: N/A;   THYROIDECTOMY  04/2009   papillary carcimao Dr. Erik Obey   TOE SURGERY     bone spur removed - rt great toe   TOTAL HIP ARTHROPLASTY  2009 and 2011   L hip    TOTAL HIP ARTHROPLASTY Right 09/12/2017   Procedure: TOTAL HIP ARTHROPLASTY ANTERIOR APPROACH;  Surgeon: Dorna Leitz, MD;  Location: [place];  Service: Orthopedics;  Laterality: Right;   TUBAL LIGATION     UPPER GI ENDOSCOPY  06/05/2018   Procedure: UPPER GI ENDOSCOPY;  Surgeon: Johnathan Hausen, MD;  [facility]: [facility];  Service: General;;    Social History   Socioeconomic History   Marital  status: Married    Spouse name: [name]   Number of children: 3   Years of education: 12+   Highest education level: Not on file  Occupational History   Occupation: unemployed   Tobacco Use   Smoking status: Never   Smokeless tobacco: Never  Vaping Use   Vaping Use: Never used  Substance and Sexual Activity   Alcohol use: No    Alcohol/week: 0.0 standard drinks    Comment: none   Drug use: No   Sexual activity: Not on file  Other Topics Concern   Not on file  Social History Narrative   Patient lives at home alone    Patient is married    Patient is not working    Patient has 3 children    Patient has her BSN RN   Patient is right handed       Daily caffeine use 1 cup daily.   Social Determinants of Health   Financial Resource Strain: Not on file  Food Insecurity: Not on file  Transportation Needs: Not on file  Physical Activity: Not on file  Stress: Not on file  Social Connections: Not on file  Intimate Partner Violence: Not on file    Family History  Problem Relation Age of Onset   Alcohol abuse Mother    Cancer Mother 88       Lung    Alcohol abuse Father    Gout Father    Breast cancer Sister    Cancer Other    Healthy Son    Healthy Son    Healthy Daughter    Hypertension Daughter     ROS: no fevers or chills, productive cough, hemoptysis, dysphasia, odynophagia, melena, hematochezia, dysuria, hematuria, rash, seizure activity, orthopnea, PND, pedal edema, claudication. Remaining systems are negative.  Physical Exam: Well-developed well-nourished in no acute distress.  Skin is warm and dry.  HEENT is normal.  Neck is supple.  Chest is clear to auscultation with normal expansion.  Cardiovascular exam is regular rate and rhythm.  Abdominal exam nontender or distended. No masses palpated. Extremities show no edema. neuro grossly intact  ECG-normal sinus rhythm at a rate of 65, no ST changes.  Personally  reviewed  A/P  1 palpitations/atrial  tachycardia-symptoms are controlled.  We will continue flecainide at present dose.  2 mildly dilated iliac-we will plan follow-up ultrasound she returns in 1 year.  3 obstructive sleep apnea-continue CPAP.  4 history of syncope-no recurrences.  Continue hydration.  Kirk Ruths, MD

## 2021-07-13 ENCOUNTER — Other Ambulatory Visit (HOSPITAL_COMMUNITY): Payer: Self-pay

## 2021-07-13 ENCOUNTER — Encounter: Payer: Self-pay | Admitting: Oncology

## 2021-07-13 DIAGNOSIS — M542 Cervicalgia: Secondary | ICD-10-CM | POA: Diagnosis not present

## 2021-07-13 DIAGNOSIS — G959 Disease of spinal cord, unspecified: Secondary | ICD-10-CM | POA: Diagnosis not present

## 2021-07-13 DIAGNOSIS — M545 Low back pain, unspecified: Secondary | ICD-10-CM | POA: Diagnosis not present

## 2021-07-13 MED ORDER — CYCLOBENZAPRINE HCL 5 MG PO TABS
ORAL_TABLET | ORAL | 0 refills | Status: DC
  Filled 2021-07-13: qty 60, 30d supply, fill #0

## 2021-07-13 MED ORDER — METHOCARBAMOL 500 MG PO TABS
ORAL_TABLET | ORAL | 1 refills | Status: DC
  Filled 2021-07-13: qty 60, 15d supply, fill #0

## 2021-07-17 ENCOUNTER — Other Ambulatory Visit: Payer: Self-pay

## 2021-07-17 ENCOUNTER — Encounter: Payer: Self-pay | Admitting: Cardiology

## 2021-07-17 ENCOUNTER — Ambulatory Visit: Payer: Medicare HMO | Admitting: Cardiology

## 2021-07-17 ENCOUNTER — Other Ambulatory Visit (HOSPITAL_COMMUNITY): Payer: Self-pay

## 2021-07-17 VITALS — BP 110/65 | HR 65 | Ht 67.0 in | Wt 186.8 lb

## 2021-07-17 DIAGNOSIS — G4733 Obstructive sleep apnea (adult) (pediatric): Secondary | ICD-10-CM

## 2021-07-17 DIAGNOSIS — I471 Supraventricular tachycardia: Secondary | ICD-10-CM

## 2021-07-17 DIAGNOSIS — Z9989 Dependence on other enabling machines and devices: Secondary | ICD-10-CM | POA: Diagnosis not present

## 2021-07-17 DIAGNOSIS — R55 Syncope and collapse: Secondary | ICD-10-CM | POA: Diagnosis not present

## 2021-07-17 DIAGNOSIS — I4719 Other supraventricular tachycardia: Secondary | ICD-10-CM

## 2021-07-17 MED ORDER — FLECAINIDE ACETATE 150 MG PO TABS
150.0000 mg | ORAL_TABLET | Freq: Two times a day (BID) | ORAL | 3 refills | Status: DC
  Filled 2021-07-17: qty 180, 90d supply, fill #0
  Filled 2021-10-22: qty 180, 90d supply, fill #1
  Filled 2022-01-29: qty 80, 40d supply, fill #2
  Filled 2022-01-30: qty 100, 50d supply, fill #2
  Filled 2022-05-07: qty 180, 90d supply, fill #3

## 2021-07-17 NOTE — Patient Instructions (Signed)

## 2021-07-19 ENCOUNTER — Other Ambulatory Visit (HOSPITAL_COMMUNITY): Payer: Self-pay

## 2021-07-26 ENCOUNTER — Other Ambulatory Visit (HOSPITAL_COMMUNITY): Payer: Self-pay

## 2021-07-26 MED ORDER — CREON 36000-114000 UNITS PO CPEP
ORAL_CAPSULE | ORAL | 3 refills | Status: AC
  Filled 2021-07-26: qty 540, 90d supply, fill #0

## 2021-07-26 MED ORDER — COLESTIPOL HCL 1 G PO TABS
ORAL_TABLET | ORAL | 3 refills | Status: AC
  Filled 2021-07-26: qty 180, 90d supply, fill #0

## 2021-07-30 ENCOUNTER — Other Ambulatory Visit (HOSPITAL_COMMUNITY): Payer: Self-pay

## 2021-07-30 ENCOUNTER — Encounter: Payer: Self-pay | Admitting: Oncology

## 2021-07-30 DIAGNOSIS — M5481 Occipital neuralgia: Secondary | ICD-10-CM | POA: Diagnosis not present

## 2021-07-30 DIAGNOSIS — M47816 Spondylosis without myelopathy or radiculopathy, lumbar region: Secondary | ICD-10-CM | POA: Diagnosis not present

## 2021-08-01 DIAGNOSIS — M542 Cervicalgia: Secondary | ICD-10-CM | POA: Diagnosis not present

## 2021-08-08 DIAGNOSIS — M545 Low back pain, unspecified: Secondary | ICD-10-CM | POA: Diagnosis not present

## 2021-08-14 DIAGNOSIS — M545 Low back pain, unspecified: Secondary | ICD-10-CM | POA: Diagnosis not present

## 2021-08-20 ENCOUNTER — Other Ambulatory Visit: Payer: Self-pay | Admitting: Family Medicine

## 2021-08-20 ENCOUNTER — Encounter: Payer: Self-pay | Admitting: Oncology

## 2021-08-20 DIAGNOSIS — Z1231 Encounter for screening mammogram for malignant neoplasm of breast: Secondary | ICD-10-CM

## 2021-08-31 ENCOUNTER — Other Ambulatory Visit (HOSPITAL_COMMUNITY): Payer: Self-pay

## 2021-08-31 MED ORDER — SERTRALINE HCL 100 MG PO TABS
200.0000 mg | ORAL_TABLET | Freq: Every day | ORAL | 0 refills | Status: DC
  Filled 2021-08-31: qty 180, 90d supply, fill #0

## 2021-09-10 ENCOUNTER — Other Ambulatory Visit (HOSPITAL_COMMUNITY): Payer: Self-pay

## 2021-09-14 ENCOUNTER — Other Ambulatory Visit (HOSPITAL_COMMUNITY): Payer: Self-pay

## 2021-09-14 MED ORDER — TRAZODONE HCL 100 MG PO TABS
ORAL_TABLET | ORAL | 1 refills | Status: DC
  Filled 2021-09-14: qty 270, 90d supply, fill #0
  Filled 2022-01-29: qty 270, 90d supply, fill #1

## 2021-09-18 ENCOUNTER — Other Ambulatory Visit (HOSPITAL_COMMUNITY): Payer: Self-pay

## 2021-09-18 ENCOUNTER — Encounter: Payer: Self-pay | Admitting: Oncology

## 2021-09-18 DIAGNOSIS — G894 Chronic pain syndrome: Secondary | ICD-10-CM | POA: Diagnosis not present

## 2021-09-18 DIAGNOSIS — M5481 Occipital neuralgia: Secondary | ICD-10-CM | POA: Diagnosis not present

## 2021-09-18 DIAGNOSIS — Z79899 Other long term (current) drug therapy: Secondary | ICD-10-CM | POA: Diagnosis not present

## 2021-09-18 DIAGNOSIS — M5459 Other low back pain: Secondary | ICD-10-CM | POA: Diagnosis not present

## 2021-09-18 DIAGNOSIS — Z79891 Long term (current) use of opiate analgesic: Secondary | ICD-10-CM | POA: Diagnosis not present

## 2021-09-18 MED ORDER — CYCLOBENZAPRINE HCL 10 MG PO TABS
ORAL_TABLET | ORAL | 0 refills | Status: DC
  Filled 2021-09-18: qty 60, 30d supply, fill #0

## 2021-09-19 ENCOUNTER — Other Ambulatory Visit (HOSPITAL_COMMUNITY): Payer: Self-pay

## 2021-09-21 ENCOUNTER — Encounter: Payer: Self-pay | Admitting: Oncology

## 2021-09-21 ENCOUNTER — Ambulatory Visit
Admission: RE | Admit: 2021-09-21 | Discharge: 2021-09-21 | Disposition: A | Discharge status: Home / Self Care | Payer: Medicare HMO | Source: Ambulatory Visit | Attending: Family Medicine | Admitting: Family Medicine

## 2021-09-21 DIAGNOSIS — Z1231 Encounter for screening mammogram for malignant neoplasm of breast: Secondary | ICD-10-CM | POA: Diagnosis not present

## 2021-10-02 ENCOUNTER — Other Ambulatory Visit (HOSPITAL_COMMUNITY): Payer: Self-pay

## 2021-10-03 ENCOUNTER — Other Ambulatory Visit (HOSPITAL_COMMUNITY): Payer: Self-pay

## 2021-10-04 ENCOUNTER — Other Ambulatory Visit (HOSPITAL_COMMUNITY): Payer: Self-pay

## 2021-10-04 MED ORDER — CYCLOSPORINE 0.05 % OP EMUL
OPHTHALMIC | 4 refills | Status: AC
  Filled 2021-10-04: qty 60, 30d supply, fill #0

## 2021-10-08 ENCOUNTER — Other Ambulatory Visit (HOSPITAL_COMMUNITY): Payer: Self-pay

## 2021-10-09 DIAGNOSIS — Z0000 Encounter for general adult medical examination without abnormal findings: Secondary | ICD-10-CM | POA: Diagnosis not present

## 2021-10-09 DIAGNOSIS — Z1389 Encounter for screening for other disorder: Secondary | ICD-10-CM | POA: Diagnosis not present

## 2021-10-15 DIAGNOSIS — I471 Supraventricular tachycardia: Secondary | ICD-10-CM | POA: Diagnosis not present

## 2021-10-15 DIAGNOSIS — R69 Illness, unspecified: Secondary | ICD-10-CM | POA: Diagnosis not present

## 2021-10-15 DIAGNOSIS — M1990 Unspecified osteoarthritis, unspecified site: Secondary | ICD-10-CM | POA: Diagnosis not present

## 2021-10-15 DIAGNOSIS — K3184 Gastroparesis: Secondary | ICD-10-CM | POA: Diagnosis not present

## 2021-10-15 DIAGNOSIS — G8929 Other chronic pain: Secondary | ICD-10-CM | POA: Diagnosis not present

## 2021-10-15 DIAGNOSIS — E785 Hyperlipidemia, unspecified: Secondary | ICD-10-CM | POA: Diagnosis not present

## 2021-10-15 DIAGNOSIS — E890 Postprocedural hypothyroidism: Secondary | ICD-10-CM | POA: Diagnosis not present

## 2021-10-15 DIAGNOSIS — G4700 Insomnia, unspecified: Secondary | ICD-10-CM | POA: Diagnosis not present

## 2021-10-15 DIAGNOSIS — Z811 Family history of alcohol abuse and dependence: Secondary | ICD-10-CM | POA: Diagnosis not present

## 2021-10-15 DIAGNOSIS — R030 Elevated blood-pressure reading, without diagnosis of hypertension: Secondary | ICD-10-CM | POA: Diagnosis not present

## 2021-10-15 DIAGNOSIS — H04129 Dry eye syndrome of unspecified lacrimal gland: Secondary | ICD-10-CM | POA: Diagnosis not present

## 2021-10-15 DIAGNOSIS — Z809 Family history of malignant neoplasm, unspecified: Secondary | ICD-10-CM | POA: Diagnosis not present

## 2021-10-16 DIAGNOSIS — G43009 Migraine without aura, not intractable, without status migrainosus: Secondary | ICD-10-CM | POA: Diagnosis not present

## 2021-10-16 DIAGNOSIS — E6609 Other obesity due to excess calories: Secondary | ICD-10-CM | POA: Diagnosis not present

## 2021-10-16 DIAGNOSIS — Z85850 Personal history of malignant neoplasm of thyroid: Secondary | ICD-10-CM | POA: Diagnosis not present

## 2021-10-16 DIAGNOSIS — Z23 Encounter for immunization: Secondary | ICD-10-CM | POA: Diagnosis not present

## 2021-10-16 DIAGNOSIS — R69 Illness, unspecified: Secondary | ICD-10-CM | POA: Diagnosis not present

## 2021-10-16 DIAGNOSIS — K591 Functional diarrhea: Secondary | ICD-10-CM | POA: Diagnosis not present

## 2021-10-16 DIAGNOSIS — F5101 Primary insomnia: Secondary | ICD-10-CM | POA: Diagnosis not present

## 2021-10-16 DIAGNOSIS — I700 Atherosclerosis of aorta: Secondary | ICD-10-CM | POA: Diagnosis not present

## 2021-10-17 ENCOUNTER — Encounter: Payer: Self-pay | Admitting: Oncology

## 2021-10-17 ENCOUNTER — Other Ambulatory Visit (HOSPITAL_COMMUNITY): Payer: Self-pay

## 2021-10-17 MED ORDER — CYCLOBENZAPRINE HCL 10 MG PO TABS
ORAL_TABLET | ORAL | 0 refills | Status: DC
  Filled 2021-10-17: qty 60, 30d supply, fill #0

## 2021-10-22 ENCOUNTER — Other Ambulatory Visit (HOSPITAL_COMMUNITY): Payer: Self-pay

## 2021-10-23 ENCOUNTER — Other Ambulatory Visit (HOSPITAL_COMMUNITY): Payer: Self-pay

## 2021-10-23 NOTE — Progress Notes (Deleted)
[facility]    Provider:  Dr Jaynee Eagles Requesting Provider: Phylliss Bob, MD Primary Care Provider:  Kathyrn Lass, MD  CC:  ***  HPI:  Rebecca Marshall is a 71 y.o. female here as requested by Phylliss Bob, MD for progressive weakness and deterioration of motor skills. PMHx anxiety, atrial tachycardia treated with flecainide, thyroid caner s/p resection, depression, cognitive changes short-term memory quit her job because of this, depression, fibromyalgia, gait abnormality, headache/migraines, hip and knee pain - aseptic necrosis of head of femur, OSA on cpap with poor compliance, herniated disc without myelopathy, morbid obesity  Reviewed notes, labs and imaging from outside physicians, which showed ***  Review of Systems: Patient complains of symptoms per HPI as well as the following symptoms ***. Pertinent negatives and positives per HPI. All others negative.   Social History   Socioeconomic History   Marital status: Married    Spouse name: Rebecca Marshall   Number of children: 3   Years of education: 12+   Highest education level: Not on file  Occupational History   Occupation: unemployed   Tobacco Use   Smoking status: Never   Smokeless tobacco: Never  Vaping Use   Vaping Use: Never used  Substance and Sexual Activity   Alcohol use: No    Alcohol/week: 0.0 standard drinks    Comment: none   Drug use: No   Sexual activity: Not on file  Other Topics Concern   Not on file  Social History Narrative   Patient lives at home alone    Patient is married    Patient is not working    Patient has 3 children    Patient has her BSN RN   Patient is right handed       Daily caffeine use 1 cup daily.   Social Determinants of Health   Financial Resource Strain: Not on file  Food Insecurity: Not on file  Transportation Needs: Not on file  Physical Activity: Not on file  Stress: Not on file  Social Connections: Not on file  Intimate Partner Violence: Not on file     Family History  Problem Relation Age of Onset   Alcohol abuse Mother    Cancer Mother 52       Lung    Alcohol abuse Father    Gout Father    Breast cancer Sister    Cancer Other    Healthy Son    Healthy Son    Healthy Daughter    Hypertension Daughter     Past Medical History:  Diagnosis Date   Acne nodule 11/26/2011   ADENOCARCINOMA, THYROID GLAND, PAPILLARY 05/17/2009   04/2009 Two surgeries for thyroid cancer Path report from last surgery :THYROID, LEFT, HEMITHYROIDECTOMY: - MULTIPLE FOCI OF FOLLICULAR VARIANT OR PAPILLARY THYROID CARCINOMA, 1.7 CM IN GREATEST DIMENSION, CONFINED WITHIN THE THYROID TISSUE. - ANGIOLYMPHATIC INVASION PRESENT. - ISTHMUS RESECTION MARGIN INVOLVED BY TUMOR. - FOLLICULAR ADENOMA(S) WITH ASSOCIATED LYMPHOCYTIC THYROIDITIS.      AK (actinic keratosis) 04/16/2012   ALLERGIC RHINITIS 01/30/2009   Qualifier: Diagnosis of  By: Andria Frames MD, [place] 09/03/2010   Long standing depression with anxiety. Good response to medications initially. Has been to [facility].     Arthritis    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 08/11/2007   LEFT AOZ:HYQMV hip replacement (Dr Berenice Primas) with recalled hardware. Had revision by Dr Berenice Primas in 2011 with  ASR .Marland Kitchen  Atrial dysrhythmia    FOLLOWED BY DR Caryl Comes WAS HAVING PALPITATIONS AND SYNCOPAL EPISODES ONSET 9 YEARS AGO ; STARTED ON FLECAIINIDE , PER PATIENT TOLERATES WELL, LAST SYNCOPAL EPISODE WAS OVER 5 YEARS AGO ; SEE LOV IN  EPIC    Atrial tachycardia (HCC) 06/05/2011   \Treated with flecainide-2011 2010-QRS duration 88 ms 2011 QRS duration 88 ms 2014 QRS duration 98 ms 2014 QRS 120mg    120 ms     Bilateral bunions 12/18/2010   Bilateral knee pain 06/29/2015   Cancer ([place])    thyroid-status post resection on replacement Dr. Chalmers Cater   Clinical depression 03/06/2016   Cognitive changes 1/69/6789   Complication of anesthesia    FENTANYL CAUSED ANAPHYLAXIS DURING CA COLONOSCOPY   Depression     Dysrhythmia    Dr Caryl Comes monitoring patient, atrial tachycardia under control, cardiac clearance 09/10/17   Family history of diabetes mellitus (DM) 05/11/2014   Fibromyalgia    Gait abnormality 12/18/2010   GE reflux    GERD 07/14/2007   Qualifier: Diagnosis of  By: Nori Riis MD, Clarise Cruz     Headache    migraines   Headache upon awakening 06/05/2011   Herniated lumbar disc without myelopathy 12/10/2013   Had surgery by neurosurgery in December, 2014.L4-5 laminotomy and microdiscectomy Dr Margreta Journey Done at [facility] specialty surgical center    Hip pain, chronic 04/05/2015   Status post THR plus revision.    Hypoxemia 01/30/2015   Memory change 05/12/2014   Reports problems with short-term memory, focusing, concentration in the last 6-12 months. Problems were significant enough that she had to quit her part-time job is fairly distressed about this.    Metatarsalgia 12/18/2010   Nausea & vomiting 06/05/2011   Nocturia more than twice per night 01/30/2015   Nonallopathic lesion of cervical region 11/26/2011   Nonallopathic lesion of lumbosacral region 11/26/2011   OSA on CPAP 01/30/2015   Osteoporosis    PONV (postoperative nausea and vomiting)    did not have nausea/vomiting with nissen fundoplication done 38/1017 at [facility]   Poor compliance with CPAP treatment 03/24/2017   Rash and nonspecific skin eruption 01/09/2016    located in the 10 o'clock position on Right breast     Severe obesity (BMI >= 40) ([place]) 01/30/2015   Sleep apnea 10/2015   x 1 year, CPAP   Snorings 01/30/2015   Status post Nissen fundoplication 51/0/2585   Subacute cough 03/27/2012   Thyroid disease    Well adult health check 06/05/2011    Patient Active Problem List   Diagnosis Date Noted   Paraesophageal hernia 01/29/2019   Hiatal hernia with GERD 06/05/2018   Absolute anemia 05/12/2018   Episodic cluster headache, not intractable 05/12/2018   Morbid obesity ([place]) 05/12/2018   Chest pain due to GERD 05/12/2018   Iron deficiency anemia  04/07/2018   Primary osteoarthritis of right hip 09/12/2017   Status post Nissen fundoplication 27/78/2423   Poor compliance with CPAP treatment 03/24/2017   Dysphonia 02/05/2017   Depression headache 03/06/2016   Clinical depression 03/06/2016   Rash and nonspecific skin eruption 01/09/2016   Bilateral knee pain 06/29/2015   Hip pain, chronic 04/05/2015   OSA on CPAP 01/30/2015   Snorings 01/30/2015   Severe obesity (BMI >= 40) ([place]) 01/30/2015   Nocturia more than twice per night 01/30/2015   Hypoxemia 01/30/2015   OSA (obstructive sleep apnea) 11/01/2014   Cognitive changes 10/12/2014   Memory change 05/12/2014   Family history of diabetes mellitus (  DM) 05/11/2014   Herniated lumbar disc without myelopathy 12/10/2013   AK (actinic keratosis) 04/16/2012   Atrial tachycardia ([place]) 06/05/2011   Bilateral bunions 12/18/2010   Metatarsalgia 12/18/2010   ANXIETY DEPRESSION 09/03/2010   ADENOCARCINOMA, THYROID GLAND, PAPILLARY 05/17/2009   ALLERGIC RHINITIS 01/30/2009   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 08/11/2007   GERD 07/14/2007   FIBROMYALGIA 07/14/2007    Past Surgical History:  Procedure Laterality Date   ABDOMINAL HYSTERECTOMY     BREAST EXCISIONAL BIOPSY Right 25+ yrs ago   benign   caroitid dopplers Bilateral 11/2015   normal   CATARACT EXTRACTION Bilateral 09/05/15, 09/12/15   CHOLECYSTECTOMY     COLONOSCOPY  04/2011   ESOPHAGEAL MANOMETRY N/A 03/31/2017   Procedure: ESOPHAGEAL MANOMETRY (EM);  Surgeon: Ronald Lobo, MD;  [facility];  Service: Endoscopy;  Laterality: N/A;   HIATAL HERNIA REPAIR N/A 06/05/2018   Procedure: LAPAROSCOPIC  ENTERO-LYSIS  ERAS PATHWAY;  Surgeon: Johnathan Hausen, MD;  [facility];  Service: General;  Laterality: N/A;   LAPAROSCOPIC NISSEN FUNDOPLICATION N/A 16/09/958   Procedure: LAPAROSCOPIC NISSEN FUNDOPLICATION;  Surgeon: Johnathan Hausen, MD;  [facility];  Service: General;  Laterality: N/A;    THYROIDECTOMY  04/2009   papillary carcimao Dr. Erik Obey   TOE SURGERY     bone spur removed - rt great toe   TOTAL HIP ARTHROPLASTY  2009 and 2011   L hip    TOTAL HIP ARTHROPLASTY Right 09/12/2017   Procedure: TOTAL HIP ARTHROPLASTY ANTERIOR APPROACH;  Surgeon: Dorna Leitz, MD;  Location: [facility];  Service: Orthopedics;  Laterality: Right;   TUBAL LIGATION     UPPER GI ENDOSCOPY  06/05/2018   Procedure: UPPER GI ENDOSCOPY;  Surgeon: Johnathan Hausen, MD;  [facility]: [facility];  Service: General;;    Current Outpatient Medications  Medication Sig Dispense Refill   acetaminophen (TYLENOL) 500 MG tablet Take 1,000 mg by mouth every 8 (eight) hours as needed for mild pain or moderate pain.      colestipol (COLESTID) 1 g tablet Take 1 g by mouth in the morning and at bedtime.     colestipol (COLESTID) 1 g tablet Take 1 tablet (1 g total) by mouth 2 times daily. 180 tablet 3   CREON 36000-114000 units CPEP capsule Take 2 capsules by mouth 3 times daily with meals. 540 capsule 3   CREON 36000-114000 units CPEP capsule Take 2 capsules by mouth 3 times daily with meals. 540 capsule 3   cyclobenzaprine (FLEXERIL) 10 MG tablet Take 1 tablet by mouth at bedtime as needed 30 tablet 0   cyclobenzaprine (FLEXERIL) 10 MG tablet Take 1 tablet by mouth twice a day 60 tablet 0   cyclobenzaprine (FLEXERIL) 5 MG tablet Take 1 to 2 tablets by mouth at bedtime as needed 60 tablet 0   cycloSPORINE (RESTASIS) 0.05 % ophthalmic emulsion Instill 1 drop into both eyes twice a day as directed (Patient not taking: Reported on 07/17/2021) 5.5 mL 4   cycloSPORINE (RESTASIS) 0.05 % ophthalmic emulsion Place 1 drop in each eye twice a day as directed. 180 each 4   diazepam (VALIUM) 10 MG tablet Take 10 mg by mouth daily as needed.     eletriptan (RELPAX) 40 MG tablet Take by mouth one at the beginning of headache and may repeat once in 2  Hours for maximum of 2 tabs in 24hours 27 tablet 3   flecainide (TAMBOCOR) 150 MG tablet  Take 1 tablet (150 mg  total) by mouth 2 (two) times daily. 180 tablet 3   levothyroxine (SYNTHROID) 150 MCG tablet Take 150 mcg by mouth daily before breakfast.     levothyroxine (SYNTHROID) 150 MCG tablet TAKE 1 TABLET BY MOUTH EVERY MORNING ON AN EMPTY STOMACH 90 tablet 6   levothyroxine (SYNTHROID) 150 MCG tablet Take 1 tablet by mouth every morning on an empty stomach. 90 tablet 0   levothyroxine (SYNTHROID) 150 MCG tablet Take 1 tablet by mouth once daily in the morning on an empty stomach 90 tablet 4   levothyroxine (SYNTHROID) 150 MCG tablet Take 1 tablet by mouth once daily in the morning on an empty stomach 90 tablet 0   Melatonin 10 MG TABS Take 10 mg by mouth at bedtime as needed (for sleep.).     methocarbamol (ROBAXIN) 500 MG tablet Take 1 tablet by mouth every six hours as needed for muscle tension/spasms during the day 60 tablet 1   Pancrelipase, Lip-Prot-Amyl, (CREON PO) Take by mouth 3 (three) times daily. Pt takes 2 tablets with each meal.     sertraline (ZOLOFT) 100 MG tablet Take 200 mg by mouth daily.     sertraline (ZOLOFT) 100 MG tablet Take 2 tablets by mouth once daily. 180 tablet 0   traZODone (DESYREL) 100 MG tablet Take 300 mg by mouth at bedtime.     traZODone (DESYREL) 100 MG tablet Take 3 tablets by mouth at bedtime daily 270 tablet 1   No current facility-administered medications for this visit.    Allergies as of 10/24/2021 - Review Complete 07/17/2021  Allergen Reaction Noted   Nsaids Other (See Comments) 11/14/2014   Codeine Nausea And Vomiting 09/25/2012    Vitals: There were no vitals taken for this visit. Last Weight:  Wt Readings from Last 1 Encounters:  07/17/21 186 lb 12.8 oz (84.7 kg)   Last Height:   Ht Readings from Last 1 Encounters:  07/17/21 5\' 7"  (1.702 m)     Physical exam: Exam: Gen: NAD, conversant, well nourised, obese, well groomed                     CV: RRR, no MRG. No Carotid Bruits. No peripheral edema, warm,  nontender Eyes: Conjunctivae clear without exudates or hemorrhage  Neuro: Detailed Neurologic Exam  Speech:    Speech is normal; fluent and spontaneous with normal comprehension.  Cognition:    The patient is oriented to person, place, and time;     recent and remote memory intact;     language fluent;     normal attention, concentration,     fund of knowledge Cranial Nerves:    The pupils are equal, round, and reactive to light. The fundi are normal and spontaneous venous pulsations are present. Visual fields are full to finger confrontation. Extraocular movements are intact. Trigeminal sensation is intact and the muscles of mastication are normal. The face is symmetric. The palate elevates in the midline. Hearing intact. Voice is normal. Shoulder shrug is normal. The tongue has normal motion without fasciculations.   Coordination:    Normal finger to nose and heel to shin. Normal rapid alternating movements.   Gait:    Heel-toe and tandem gait are normal.   Motor Observation:    No asymmetry, no atrophy, and no involuntary movements noted. Tone:    Normal muscle tone.    Posture:    Posture is normal. normal erect    Strength:    Strength is V/V in  the upper and lower limbs.      Sensation: intact to LT     Reflex Exam:  DTR's:    Deep tendon reflexes in the upper and lower extremities are normal bilaterally.   Toes:    The toes are downgoing bilaterally.   Clonus:    Clonus is absent.    Assessment/Plan:    No orders of the defined types were placed in this encounter.  No orders of the defined types were placed in this encounter.   Cc: Phylliss Bob, MD,  Kathyrn Lass, MD  Sarina Ill, MD  [facility] [address] [place], [place] [address]  Phone [phone] Fax [phone]

## 2021-10-24 ENCOUNTER — Ambulatory Visit: Payer: Medicare HMO | Admitting: Neurology

## 2021-10-25 ENCOUNTER — Encounter: Payer: Self-pay | Admitting: Neurology

## 2021-10-26 ENCOUNTER — Other Ambulatory Visit (HOSPITAL_COMMUNITY): Payer: Self-pay

## 2021-10-26 MED ORDER — ROSUVASTATIN CALCIUM 5 MG PO TABS
5.0000 mg | ORAL_TABLET | Freq: Every day | ORAL | 0 refills | Status: DC
  Filled 2021-10-26: qty 30, 30d supply, fill #0

## 2021-11-19 NOTE — Progress Notes (Signed)
[name]    Provider:  Dr Jaynee Eagles Requesting Provider: Kathyrn Lass, MD Primary Care Provider:  Kathyrn Lass, MD  CC:  Progressive weakness,deterioration fine motor skills  HPI:  Rebecca Marshall is a 71 y.o. female here as requested by Kathyrn Lass, MD for progressive weakness and deterioration in fine motor skills.  71 y.o. female here as requested by Phylliss Bob, MD for progressive weakness and deterioration of motor skills. PMHx anxiety, atrial tachycardia treated with flecainide, thyroid caner s/p resection, depression, cognitive changes short-term memory quit her job because of this, depression, fibromyalgia, gait abnormality, headache/migraines, hip and knee pain - aseptic necrosis of head of femur, OSA on cpap with poor compliance, herniated disc without myelopathy, morbid obesity.  I reviewed notes from [place] and sports medicine, she was seen for neck pain, bilateral hand numbness, tingling and weakness, low back pain and fecal incontinence, date of appointment last was August 14, 2021, she stated that she had the symptoms for years but they have been progressive over the course of the last year, she saw a neurologist many years ago, the pain in her back has been present for years as well, increasing with bending and twisting, also pain in her posterior occipital region, patient was noted to have a positive Hoffmann sign in the right greater than left, hyperreflexic, the patient cervical and lumbar MRIs for November 2022 were both reviewed, the patient cervical spine was unremarkable, no spinal cord compression noted, the patient's lumbar MRI was notable for chronic degenerative changes throughout the lumbar spine, but no high-grade lumbar spinal stenosis identified.  She was referred to neurology for her bilateral hand numbness tingling and weakness, and she was referred to pain management Dr.'s BiV for her greater occipital neuralgia and ongoing low  back pain.  She was given a right occipital nerve block at a prior appointment at [facility].  At prior appointments in October 2022 she stated that she had had a tremor in her left hand in the form of a resting tremor for many years but that is dramatically worsened and she is now noticing symptoms in her right hand, dropping things frequently, getting progressive numbness tingling and weakness in her hands that concerns her.(Reviewed prior history additional 20 minutes 11/19/2021)  The left had is progressively worse with tremor. Not with resting, with action and postural. I saw her in 2016 for similar and the symptom quality is the same but worsening in frequency and severity. She drops things, hard to eat soup, no numbness or tingling, she is unsteady on hills but that is stable, she does not practice balance and has been to PT (she declines) she has not been doing the exercises. MRI cervical spine or lumbar did not show any hig grade stenosis to cause imbalance, no known tremors in family parents died young and no siblings. She has an endocrinologist, tsh is fine. She went to rheumatologist and reviewed labs: ra, ana, esr normal 02/2021. She has occipital neuralgia and is going to pain management. Tremor started 10-120 years ago. She has poor grip, feels it is weakness as well as tremor. Left is worse than the right and.   She was referred to [place] for tremor, bilateral hand numbness tingling and weakness, resting tremor. Her pain management (occipital neuralgia) is treated by Dr. Vira Blanco.    Reviewed notes, labs and imaging from outside physicians, which showed:  reviewed labs: ra, ana, esr normal 02/2021.  CT head 2020: reviewed images of CT head  and agree, reviewed CT c-spine report: CT HEAD FINDINGS   Brain: No evidence of acute infarction, hemorrhage, hydrocephalus, extra-axial collection or mass lesion/mass effect.   Vascular: No hyperdense vessel or unexpected calcification.    Skull: Normal. Negative for fracture or focal lesion.   Other: Soft tissue swelling is seen over the left forehead. No other soft tissue abnormalities.   CT MAXILLOFACIAL FINDINGS   Osseous: No fracture or mandibular dislocation. No destructive process.   Orbits: Negative. No traumatic or inflammatory finding.   Sinuses: Clear.   Soft tissues: Soft tissue swelling over the left forehead with a laceration. Soft tissues otherwise normal.   CT CERVICAL SPINE FINDINGS   Alignment: There is minimal anterolisthesis of C4 versus C5 measuring 2 mm, unchanged since April 2014. No acute malalignment.   Skull base and vertebrae: No acute fracture. No primary bone lesion or focal pathologic process.   Soft tissues and spinal canal: Rounded low-attenuation in the superior mediastinum on series 9, image 100 was better seen on a previous CT of the chest, likely a prominent peri aortic recess. Soft tissues are otherwise normal.   Disc levels: Multilevel degenerative disc disease and facet degenerative changes.   Upper chest: Negative.   Other: No other abnormalities.   IMPRESSION: 1. No acute intracranial abnormalities. 2. No facial bone fractures identified. 3. No fracture or traumatic malalignment in the cervical spine. Minimal anterolisthesis of C4 versus C5 is likely degenerative, unchanged since 2014.  Review of Systems: Patient complains of symptoms per HPI as well as the following symptoms tremor. Pertinent negatives and positives per HPI. All others negative.   Social History   Socioeconomic History   Marital status: Married    Spouse name: [name]   Number of children: 3   Years of education: 12+   Highest education level: Not on file  Occupational History   Occupation: unemployed   Tobacco Use   Smoking status: Never   Smokeless tobacco: Never  Vaping Use   Vaping Use: Never used  Substance and Sexual Activity   Alcohol use: No    Alcohol/week: 0.0 standard  drinks    Comment: none   Drug use: No   Sexual activity: Not on file  Other Topics Concern   Not on file  Social History Narrative   Patient lives at home alone    Patient is married    Patient is not working    Patient has 3 children    Patient has her BSN RN   Patient is right handed       Daily caffeine use 1 cup daily.   Social Determinants of Health   Financial Resource Strain: Not on file  Food Insecurity: Not on file  Transportation Needs: Not on file  Physical Activity: Not on file  Stress: Not on file  Social Connections: Not on file  Intimate Partner Violence: Not on file    Family History  Problem Relation Age of Onset   Migraines Mother    Alcohol abuse Mother    Cancer Mother 70       Lung    Alcohol abuse Father    Gout Father    Migraines Sister    Breast cancer Sister    Migraines Sister    Migraines Sister    Colon cancer Sister    Migraines Maternal Grandmother    Healthy Daughter    Hypertension Daughter    Healthy Son    Healthy Son    Cancer Other  Past Medical History:  Diagnosis Date   Acne nodule 11/26/2011   ADENOCARCINOMA, THYROID GLAND, PAPILLARY 05/17/2009   04/2009 Two surgeries for thyroid cancer Path report from last surgery :THYROID, LEFT, HEMITHYROIDECTOMY: - MULTIPLE FOCI OF FOLLICULAR VARIANT OR PAPILLARY THYROID CARCINOMA, 1.7 CM IN GREATEST DIMENSION, CONFINED WITHIN THE THYROID TISSUE. - ANGIOLYMPHATIC INVASION PRESENT. - ISTHMUS RESECTION MARGIN INVOLVED BY TUMOR. - FOLLICULAR ADENOMA(S) WITH ASSOCIATED LYMPHOCYTIC THYROIDITIS.      AK (actinic keratosis) 04/16/2012   ALLERGIC RHINITIS 01/30/2009   Qualifier: Diagnosis of  By: Andria Frames MD, [place] 09/03/2010   Long standing depression with anxiety. Good response to medications initially. Has been to [facility].     Arthritis    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 08/11/2007   LEFT XID:HWYSH hip replacement (Dr Berenice Primas) with recalled  hardware. Had revision by Dr Berenice Primas in 2011 with  ASR .Marland Kitchen     Atrial dysrhythmia    FOLLOWED BY DR Caryl Comes WAS HAVING PALPITATIONS AND SYNCOPAL EPISODES ONSET 9 YEARS AGO ; STARTED ON FLECAIINIDE , PER PATIENT TOLERATES WELL, LAST SYNCOPAL EPISODE WAS OVER 5 YEARS AGO ; SEE LOV IN  EPIC    Atrial tachycardia (HCC) 06/05/2011   \Treated with flecainide-2011 2010-QRS duration 88 ms 2011 QRS duration 88 ms 2014 QRS duration 98 ms 2014 QRS $Remove'120mg'[id number]$    120 ms     Bilateral bunions 12/18/2010   Bilateral knee pain 06/29/2015   Cancer ([place])    thyroid-status post resection on replacement Dr. Chalmers Cater   Clinical depression 03/06/2016   Cognitive changes 6/83/7290   Complication of anesthesia    FENTANYL CAUSED ANAPHYLAXIS DURING CA COLONOSCOPY   Depression    Dysrhythmia    Dr Caryl Comes monitoring patient, atrial tachycardia under control, cardiac clearance 09/10/17   Family history of diabetes mellitus (DM) 05/11/2014   Fibromyalgia    Gait abnormality 12/18/2010   GE reflux    GERD 07/14/2007   Qualifier: Diagnosis of  By: Nori Riis MD, Clarise Cruz     Headache    migraines   Headache upon awakening 06/05/2011   Herniated lumbar disc without myelopathy 12/10/2013   Had surgery by neurosurgery in December, 2014.L4-5 laminotomy and microdiscectomy Dr Margreta Journey Done at [facility]    Hip pain, chronic 04/05/2015   Status post THR plus revision.    Hypoxemia 01/30/2015   Memory change 05/12/2014   Reports problems with short-term memory, focusing, concentration in the last 6-12 months. Problems were significant enough that she had to quit her part-time job is fairly distressed about this.    Metatarsalgia 12/18/2010   Nausea & vomiting 06/05/2011   Nocturia more than twice per night 01/30/2015   Nonallopathic lesion of cervical region 11/26/2011   Nonallopathic lesion of lumbosacral region 11/26/2011   OSA on CPAP 01/30/2015   Osteoporosis    PONV (postoperative nausea and vomiting)    did not have nausea/vomiting  with nissen fundoplication done 21/[facility]   Poor compliance with CPAP treatment 03/24/2017   Rash and nonspecific skin eruption 01/09/2016    located in the 10 o'clock position on Right breast     Severe obesity (BMI >= 40) ([place]) 01/30/2015   Sleep apnea 10/2015   x 1 year, CPAP   Snorings 01/30/2015   Status post Nissen fundoplication 20/04/222   Subacute cough 03/27/2012   Thyroid disease    Well adult health check 06/05/2011    Patient  Active Problem List   Diagnosis Date Noted   Paraesophageal hernia 01/29/2019   Hiatal hernia with GERD 06/05/2018   Absolute anemia 05/12/2018   Episodic cluster headache, not intractable 05/12/2018   Morbid obesity ([place]) 05/12/2018   Chest pain due to GERD 05/12/2018   Iron deficiency anemia 04/07/2018   Primary osteoarthritis of right hip 09/12/2017   Status post Nissen fundoplication 02/54/2706   Poor compliance with CPAP treatment 03/24/2017   Dysphonia 02/05/2017   Depression headache 03/06/2016   Clinical depression 03/06/2016   Rash and nonspecific skin eruption 01/09/2016   Bilateral knee pain 06/29/2015   Hip pain, chronic 04/05/2015   OSA on CPAP 01/30/2015   Snorings 01/30/2015   Severe obesity (BMI >= 40) (HCC) 01/30/2015   Nocturia more than twice per night 01/30/2015   Hypoxemia 01/30/2015   OSA (obstructive sleep apnea) 11/01/2014   Cognitive changes 10/12/2014   Memory change 05/12/2014   Family history of diabetes mellitus (DM) 05/11/2014   Herniated lumbar disc without myelopathy 12/10/2013   AK (actinic keratosis) 04/16/2012   Atrial tachycardia ([place]) 06/05/2011   Bilateral bunions 12/18/2010   Metatarsalgia 12/18/2010   ANXIETY DEPRESSION 09/03/2010   ADENOCARCINOMA, THYROID GLAND, PAPILLARY 05/17/2009   ALLERGIC RHINITIS 01/30/2009   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 08/11/2007   GERD 07/14/2007   FIBROMYALGIA 07/14/2007    Past Surgical History:  Procedure Laterality Date   ABDOMINAL  HYSTERECTOMY     BREAST EXCISIONAL BIOPSY Right 25+ yrs ago   benign   caroitid dopplers Bilateral 11/2015   normal   CATARACT EXTRACTION Bilateral 09/05/15, 09/12/15   CHOLECYSTECTOMY     COLONOSCOPY  04/2011   ESOPHAGEAL MANOMETRY N/A 03/31/2017   Procedure: ESOPHAGEAL MANOMETRY (EM);  Surgeon: Ronald Lobo, MD;  Location: [facility];  Service: Endoscopy;  Laterality: N/A;   HIATAL HERNIA REPAIR N/A 06/05/2018   Procedure: LAPAROSCOPIC  ENTERO-LYSIS  ERAS PATHWAY;  Surgeon: Johnathan Hausen, MD;  Location: [facility];  Service: General;  Laterality: N/A;   LAPAROSCOPIC NISSEN FUNDOPLICATION N/A 23/03/6282   Procedure: LAPAROSCOPIC NISSEN FUNDOPLICATION;  Surgeon: Johnathan Hausen, MD;  Location: [facility];  Service: General;  Laterality: N/A;   THYROIDECTOMY  04/2009   papillary carcimao Dr. Erik Obey   TOE SURGERY     bone spur removed - rt great toe   TOTAL HIP ARTHROPLASTY  2009 and 2011   L hip    TOTAL HIP ARTHROPLASTY Right 09/12/2017   Procedure: TOTAL HIP ARTHROPLASTY ANTERIOR APPROACH;  Surgeon: Dorna Leitz, MD;  Location: [facility];  Service: Orthopedics;  Laterality: Right;   TUBAL LIGATION     UPPER GI ENDOSCOPY  06/05/2018   Procedure: UPPER GI ENDOSCOPY;  Surgeon: Johnathan Hausen, MD;  Location: [facility];  Service: General;;    Current Outpatient Medications  Medication Sig Dispense Refill   acetaminophen (TYLENOL) 500 MG tablet Take 1,000 mg by mouth every 8 (eight) hours as needed for mild pain or moderate pain.      colestipol (COLESTID) 1 g tablet Take 1 tablet (1 g total) by mouth 2 times daily. 180 tablet 3   CREON 36000-114000 units CPEP capsule Take 2 capsules by mouth 3 times daily with meals. 540 capsule 3   cyclobenzaprine (FLEXERIL) 10 MG tablet Take 1 tablet by mouth twice a day 60 tablet 0   cycloSPORINE (RESTASIS) 0.05 % ophthalmic emulsion Place 1 drop in each eye twice a day as directed. 180 each 4   diazepam (VALIUM) 10 MG  tablet Take 10 mg by mouth daily as needed.      eletriptan (RELPAX) 40 MG tablet Take by mouth one at the beginning of headache and may repeat once in 2  Hours for maximum of 2 tabs in 24hours 27 tablet 3   flecainide (TAMBOCOR) 150 MG tablet Take 1 tablet (150 mg total) by mouth 2 (two) times daily. 180 tablet 3   levothyroxine (SYNTHROID) 150 MCG tablet Take 1 tablet by mouth once daily in the morning on an empty stomach 90 tablet 4   Pancrelipase, Lip-Prot-Amyl, (CREON PO) Take by mouth 3 (three) times daily. Pt takes 2 tablets with each meal.     primidone (MYSOLINE) 50 MG tablet Take 1 tablet  by mouth at bedtime. 30 tablet 6   rosuvastatin (CRESTOR) 5 MG tablet Take 1 tablet (5 mg total) by mouth daily. 30 tablet 0   sertraline (ZOLOFT) 100 MG tablet Take 2 tablets by mouth once daily. 180 tablet 0   levothyroxine (SYNTHROID) 150 MCG tablet TAKE 1 TABLET BY MOUTH EVERY MORNING ON AN EMPTY STOMACH 90 tablet 6   levothyroxine (SYNTHROID) 150 MCG tablet Take 1 tablet by mouth once daily in the morning on an empty stomach 90 tablet 0   No current facility-administered medications for this visit.    Allergies as of 11/20/2021 - Review Complete 11/20/2021  Allergen Reaction Noted   Nsaids Other (See Comments) 11/14/2014   Codeine Nausea And Vomiting 09/25/2012    Vitals: BP 124/75    Pulse 89    Ht 5' 6.5" (1.689 m)    Wt 189 lb 3.2 oz (85.8 kg)    BMI 30.08 kg/m  Last Weight:  Wt Readings from Last 1 Encounters:  11/20/21 189 lb 3.2 oz (85.8 kg)   Last Height:   Ht Readings from Last 1 Encounters:  11/20/21 5' 6.5" (1.689 m)     Physical exam: Exam: Gen: NAD, conversant, well nourised, well groomed                     CV: RRR, no MRG. No Carotid Bruits. No peripheral edema, warm, nontender Eyes: Conjunctivae clear without exudates or hemorrhage  Neuro: Detailed Neurologic Exam  Speech:    Speech is normal; fluent and spontaneous with normal comprehension.  Cognition:    The patient is oriented to person, place, and  time;     recent and remote memory intact;     language fluent;     normal attention, concentration,     fund of knowledge Cranial Nerves:    The pupils are equal, round, and reactive to light. Pupils too small to visualize fundi. Visual fields are full to finger confrontation. Extraocular movements are intact. Trigeminal sensation is intact and the muscles of mastication are normal. The face is symmetric. The palate elevates in the midline. Hearing intact. Voice is normal. Shoulder shrug is normal. The tongue has normal motion without fasciculations.   Coordination:    Normal finger to nose  Gait:    Can get up independently, wide based gait, good arm swing, holds arm out to the side to help with balance, no shuffling, no enbloc turning  Motor Observation:    No resting tremor. Bilateral postural > action tremor high freq low amplitude.  Tone:    Normal muscle tone.    Posture:    Posture is normal. normal erect    Strength: left prox hip flex weakness (surgical) otherwise strength is V/V in the  upper and lower limbs.      Sensation: intact to LT, pin prick, vibration      Reflex Exam:  DTR's:    Deep tendon reflexes in the upper and lower extremities are hyporeflexic but symmetrical  bilaterally.   Toes:    The toes are downgoing bilaterally.   Clonus:    Clonus is absent.    Assessment/Plan:  71 y.o. female here as requested by Kathyrn Lass, MD for progressive weakness and deterioration in fine motor skills.  71 y.o. female here as requested by Phylliss Bob, MD for progressive weakness and deterioration of motor skills. PMHx anxiety, atrial tachycardia treated with flecainide, thyroid caner s/p resection, depression, cognitive changes short-term memory quit her job because of this, depression, fibromyalgia, gait abnormality, headache/migraines, hip and knee pain - aseptic necrosis of head of femur, OSA on cpap with poor compliance, herniated disc without myelopathy, morbid  obesity.I do NOT think she has parkinson's disease.  Ataxia: MRI brain and MRI Thoracic spine (MRi cervical spine and lumbar already completed) Weakness: EMG/NCS left arm and left leg for weakness((she declines)) Tremor; Essential tremor: Can't take propranolol due to hypotension will try primidone, stop the trazodone Occipital neuralgia: was sent to Dr. Vira Blanco, follow up   Orders Placed This Encounter  Procedures   MR THORACIC SPINE WO CONTRAST   MR BRAIN W WO CONTRAST   Meds ordered this encounter  Medications   primidone (MYSOLINE) 50 MG tablet    Sig: Take 1 tablet  by mouth at bedtime.    Dispense:  30 tablet    Refill:  6    Cc: Kathyrn Lass, MD,  Kathyrn Lass, MD  Sarina Ill, MD  [facility] [address] [place], [place] [address]  Phone [phone] Fax [phone]

## 2021-11-20 ENCOUNTER — Telehealth: Payer: Self-pay | Admitting: Neurology

## 2021-11-20 ENCOUNTER — Ambulatory Visit: Payer: Medicare HMO | Admitting: Neurology

## 2021-11-20 ENCOUNTER — Encounter: Payer: Self-pay | Admitting: Neurology

## 2021-11-20 ENCOUNTER — Other Ambulatory Visit (HOSPITAL_COMMUNITY): Payer: Self-pay

## 2021-11-20 VITALS — BP 124/75 | HR 89 | Ht 66.5 in | Wt 189.2 lb

## 2021-11-20 DIAGNOSIS — R531 Weakness: Secondary | ICD-10-CM | POA: Diagnosis not present

## 2021-11-20 DIAGNOSIS — R251 Tremor, unspecified: Secondary | ICD-10-CM | POA: Diagnosis not present

## 2021-11-20 DIAGNOSIS — R29898 Other symptoms and signs involving the musculoskeletal system: Secondary | ICD-10-CM | POA: Diagnosis not present

## 2021-11-20 DIAGNOSIS — W19XXXA Unspecified fall, initial encounter: Secondary | ICD-10-CM | POA: Diagnosis not present

## 2021-11-20 DIAGNOSIS — R270 Ataxia, unspecified: Secondary | ICD-10-CM

## 2021-11-20 MED ORDER — PRIMIDONE 50 MG PO TABS
50.0000 mg | ORAL_TABLET | Freq: Every day | ORAL | 6 refills | Status: DC
  Filled 2021-11-20: qty 30, 30d supply, fill #0

## 2021-11-20 MED ORDER — ROSUVASTATIN CALCIUM 5 MG PO TABS
5.0000 mg | ORAL_TABLET | Freq: Every day | ORAL | 1 refills | Status: DC
  Filled 2021-11-20: qty 30, 30d supply, fill #0
  Filled 2022-03-28: qty 30, 30d supply, fill #1

## 2021-11-20 NOTE — Telephone Encounter (Signed)
[employer] medicare order sent to [facility], they will obtain the auth and reach out to the patient to schedule.  

## 2021-11-20 NOTE — Patient Instructions (Addendum)
Ataxia: MRI brain and MRI Thoracic spine (MRi cervical spine and lumbar already completed) Weakness: EMG/NCS left arm and left leg for weakness(she declines) Tremor; Essential tremor: Can't take propranolol due to hypotension will try primidone, stop the trazodone slowly reduce to 50mg  for a week then stop. Start with the primidone 25mg  at bedtime and in 2 weeks can increase to 50mg  at bedtime. Can further increase from there.  Essential Tremor A tremor is trembling or shaking that a person cannot control. Most tremors affect the hands or arms. Tremors can also affect the head, vocal cords, legs, and other parts of the body. Essential tremor is a tremor without a known cause. Usually, it occurs while a person is trying to perform an action. It tends to get worse gradually as a person ages. What are the causes? The cause of this condition is not known. What increases the risk? You are more likely to develop this condition if: You have a family member with essential tremor. You are age 70 or older. You take certain medicines. What are the signs or symptoms? The main sign of a tremor is a rhythmic shaking of certain parts of your body that is uncontrolled and unintentional. You may: Have difficulty eating with a spoon or fork. Have difficulty writing. Nod your head up and down or side to side. Have a quivering voice. The shaking may: Get worse over time. Come and go. Be more noticeable on one side of your body. Get worse due to stress, fatigue, caffeine, and extreme heat or cold. How is this diagnosed? This condition may be diagnosed based on: Your symptoms and medical history. A physical exam. There is no single test to diagnose an essential tremor. However, your health care provider may order tests to rule out other causes of your condition. These may include: Blood and urine tests. Imaging studies of your brain, such as CT scan and MRI. A test that measures involuntary muscle movement  (electromyogram). How is this treated? Treatment for essential tremor depends on the severity of the condition. Some tremors may go away without treatment. Mild tremors may not need treatment if they do not affect your day-to-day life. Severe tremors may need to be treated using one or more of the following options: Medicines. Lifestyle changes. Occupational or physical therapy. Follow these instructions at home: Lifestyle  Do not use any products that contain nicotine or tobacco, such as cigarettes and e-cigarettes. If you need help quitting, ask your health care provider. Limit your caffeine intake as told by your health care provider. Try to get 8 hours of sleep each night. Find ways to manage your stress that fits your lifestyle and personality. Consider trying meditation or yoga. Try to anticipate stressful situations and allow extra time to manage them. If you are struggling emotionally with the effects of your tremor, consider working with a mental health provider. General instructions Take over-the-counter and prescription medicines only as told by your health care provider. Avoid extreme heat and extreme cold. Keep all follow-up visits as told by your health care provider. This is important. Visits may include physical therapy visits. Contact a health care provider if: You experience any changes in the location or intensity of your tremors. You start having a tremor after starting a new medicine. You have tremor with other symptoms, such as: Numbness. Tingling. Pain. Weakness. Your tremor gets worse. Your tremor interferes with your daily life. You feel down, blue, or sad for at least 2 weeks in a row.  Worrying about your tremor and what other people think about you interferes with your everyday life functions, including relationships, work, or school. Summary Essential tremor is a tremor without a known cause. Usually, it occurs when you are trying to perform an  action. You are more likely to develop this condition if you have a family member with essential tremor. The main sign of a tremor is a rhythmic shaking of certain parts of your body that is uncontrolled and unintentional. Treatment for essential tremor depends on the severity of the condition. This information is not intended to replace advice given to you by your health care provider. Make sure you discuss any questions you have with your health care provider. Document Revised: 05/31/2020 Document Reviewed: 06/02/2020 Elsevier Patient Education  2022 [employer].   Primidone Tablets What is this medication? PRIMIDONE (PRI mi done) prevents and controls seizures in people with epilepsy. It works by calming overactive nerves in your body. This medicine may be used for other purposes; ask your health care provider or pharmacist if you have questions. COMMON BRAND NAME(S): Mysoline What should I tell my care team before I take this medication? They need to know if you have any of these conditions: Kidney disease Liver disease Porphyria Suicidal thoughts, plans, or attempt by you or a family member An unusual or allergic reaction to primidone, phenobarbital, other barbiturates or seizure medications, other medications, foods, dyes, or preservatives Pregnant or trying to get pregnant Breast-feeding How should I use this medication? Take this medication by mouth with a glass of water. Follow the directions on the prescription label. Take your doses at regular intervals. Do not take your medication more often than directed. Do not stop taking except on the advice of your care team. A special MedGuide will be given to you by the pharmacist with each prescription and refill. Be sure to read this information carefully each time. Contact your care team about the use of this medication in children. Special care may be needed. While this medication may be prescribed for children for selected  conditions, precautions do apply. Overdosage: If you think you have taken too much of this medicine contact a poison control center or emergency room at once. NOTE: This medicine is only for you. Do not share this medicine with others. What if I miss a dose? If you miss a dose, take it as soon as you can. If it is almost time for your next dose, take only that dose. Do not take double or extra doses. What may interact with this medication? Do not take this medication with any of the following: Voriconazole This medication may also interact with the following: Cancer-treating medications Cyclosporine Disopyramide Doxycycline Estrogen or progestin hormones Medications for depression, anxiety or other mental health conditions Medications for treating HIV infection or AIDS Modafinil Prescription pain medications Quinidine Warfarin This list may not describe all possible interactions. Give your health care provider a list of all the medicines, herbs, non-prescription drugs, or dietary supplements you use. Also tell them if you smoke, drink alcohol, or use illegal drugs. Some items may interact with your medicine. What should I watch for while using this medication? Visit your care team for regular checks on your progress. It may be 2 to 3 weeks before you see the full effects of this medication. Do not suddenly stop taking this medication, you may increase the risk of seizures. Your care team may want to gradually reduce the dose. Wear a medical identification bracelet or  chain to say you have epilepsy, and carry a card that lists all your medications. You may get drowsy or dizzy. Do not drive, use machinery, or do anything that needs mental alertness until you know how this medication affects you. Do not stand or sit up quickly, especially if you are an older patient. This reduces the risk of dizzy or fainting spells. Alcohol may interfere with the effect of this medication. Avoid alcoholic  drinks. Birth control pills may not work properly while you are taking this medication. Talk to your care team about using an extra method of birth control. The use of this medication may increase the chance of suicidal thoughts or actions. Pay special attention to how you are responding while on this medication. Any worsening of mood, or thoughts of suicide or dying should be reported to your care team right away. Women who become pregnant while using this medication may enroll in the [place] Pregnancy Registry by calling [phone]. This registry collects information about the safety of antiepileptic medication use during pregnancy. This medication may cause a decrease in vitamin D and folic acid. You should make sure that you get enough vitamins while you are taking this medication. Discuss the foods you eat and the vitamins you take with your care team. What side effects may I notice from receiving this medication? Side effects that you should report to your care team as soon as possible: Allergic reactions--skin rash, itching, hives, swelling of the face, lips, tongue, or throat CNS depression--slow or shallow breathing, shortness of breath, feeling faint, dizziness, confusion, trouble staying awake Thoughts of suicide or self-harm, worsening mood, or feelings of depression Side effects that usually do not require medical attention (report to your care team if they continue or are bothersome): Dizziness Drowsiness Loss of balance or coordination Nausea This list may not describe all possible side effects. Call your doctor for medical advice about side effects. You may report side effects to FDA at 1-800-FDA-1088. Where should I keep my medication? Keep out of the reach of children and pets. This medication may cause accidental overdose and death if it is taken by other adults, children, or pets. Mix any unused medication with a substance like cat litter or coffee  grounds. Then throw the medication away in a sealed container like a sealed bag or a coffee can with a lid. Do not use the medication after the expiration date. Store at room temperature between 15 and 30 degrees C (59 and 86 degrees F). NOTE: This sheet is a summary. It may not cover all possible information. If you have questions about this medicine, talk to your doctor, pharmacist, or health care provider.  2022 Elsevier/Gold Standard (2021-05-14 00:00:00)

## 2021-11-21 LAB — BASIC METABOLIC PANEL
BUN/Creatinine Ratio: 24 (ref 12–28)
BUN: 18 mg/dL (ref 8–27)
CO2: 25 mmol/L (ref 20–29)
Calcium: 8.9 mg/dL (ref 8.7–10.3)
Chloride: 105 mmol/L (ref 96–106)
Creatinine, Ser: 0.75 mg/dL (ref 0.57–1.00)
Glucose: 91 mg/dL (ref 70–99)
Potassium: 3.8 mmol/L (ref 3.5–5.2)
Sodium: 146 mmol/L — ABNORMAL HIGH (ref 134–144)
eGFR: 86 mL/min/{1.73_m2} (ref >59)

## 2021-11-28 ENCOUNTER — Ambulatory Visit
Admission: RE | Admit: 2021-11-28 | Discharge: 2021-11-28 | Disposition: A | Discharge status: Home / Self Care | Payer: Medicare HMO | Source: Ambulatory Visit | Attending: Neurology | Admitting: Neurology

## 2021-11-28 DIAGNOSIS — W19XXXA Unspecified fall, initial encounter: Secondary | ICD-10-CM

## 2021-11-28 DIAGNOSIS — R251 Tremor, unspecified: Secondary | ICD-10-CM | POA: Diagnosis not present

## 2021-11-28 DIAGNOSIS — R270 Ataxia, unspecified: Secondary | ICD-10-CM

## 2021-11-28 DIAGNOSIS — R531 Weakness: Secondary | ICD-10-CM | POA: Diagnosis not present

## 2021-11-28 DIAGNOSIS — R29898 Other symptoms and signs involving the musculoskeletal system: Secondary | ICD-10-CM

## 2021-11-28 MED ORDER — GADOBENATE DIMEGLUMINE 529 MG/ML IV SOLN
17.0000 mL | Freq: Once | INTRAVENOUS | Status: AC | PRN
  Administered 2021-11-28: 17 mL via INTRAVENOUS

## 2021-12-17 ENCOUNTER — Telehealth: Payer: Self-pay | Admitting: Neurology

## 2021-12-17 ENCOUNTER — Other Ambulatory Visit (HOSPITAL_COMMUNITY): Payer: Self-pay

## 2021-12-17 MED ORDER — ROSUVASTATIN CALCIUM 5 MG PO TABS
5.0000 mg | ORAL_TABLET | Freq: Every day | ORAL | 2 refills | Status: DC
  Filled 2021-12-17: qty 30, 30d supply, fill #0

## 2021-12-17 MED ORDER — PRIMIDONE 50 MG PO TABS
50.0000 mg | ORAL_TABLET | Freq: Every day | ORAL | 0 refills | Status: DC
  Filled 2021-12-17: qty 90, 90d supply, fill #0

## 2021-12-17 MED ORDER — CYCLOBENZAPRINE HCL 10 MG PO TABS
10.0000 mg | ORAL_TABLET | Freq: Two times a day (BID) | ORAL | 0 refills | Status: DC
  Filled 2021-12-17: qty 60, 30d supply, fill #0

## 2021-12-17 NOTE — Telephone Encounter (Signed)
Done

## 2021-12-17 NOTE — Telephone Encounter (Signed)
Pt states the primidone (MYSOLINE) 50 MG tablet is working great for her, she'd like to know if a 90 day can be called into [place] [facility] ?

## 2021-12-18 ENCOUNTER — Other Ambulatory Visit (HOSPITAL_COMMUNITY): Payer: Self-pay

## 2021-12-18 ENCOUNTER — Encounter: Payer: Self-pay | Admitting: Oncology

## 2021-12-18 MED ORDER — DIAZEPAM 10 MG PO TABS
ORAL_TABLET | ORAL | 0 refills | Status: AC
  Filled 2021-12-18: qty 30, 30d supply, fill #0

## 2022-01-01 ENCOUNTER — Other Ambulatory Visit (HOSPITAL_COMMUNITY): Payer: Self-pay

## 2022-01-01 DIAGNOSIS — J019 Acute sinusitis, unspecified: Secondary | ICD-10-CM | POA: Diagnosis not present

## 2022-01-01 DIAGNOSIS — J3489 Other specified disorders of nose and nasal sinuses: Secondary | ICD-10-CM | POA: Diagnosis not present

## 2022-01-01 MED ORDER — MUPIROCIN CALCIUM 2 % EX CREA
TOPICAL_CREAM | CUTANEOUS | 0 refills | Status: AC
Start: 2022-01-01 — End: ?
  Filled 2022-01-01: qty 15, 7d supply, fill #0

## 2022-01-01 MED ORDER — AMOXICILLIN-POT CLAVULANATE 875-125 MG PO TABS
ORAL_TABLET | ORAL | 0 refills | Status: AC
  Filled 2022-01-01: qty 20, 10d supply, fill #0

## 2022-01-02 ENCOUNTER — Other Ambulatory Visit (HOSPITAL_COMMUNITY): Payer: Self-pay

## 2022-01-02 MED ORDER — MUPIROCIN 2 % EX OINT
TOPICAL_OINTMENT | CUTANEOUS | 0 refills | Status: DC
Start: 2022-01-02 — End: 2022-07-30
  Filled 2022-01-02: qty 22, 7d supply, fill #0

## 2022-01-09 DIAGNOSIS — R1013 Epigastric pain: Secondary | ICD-10-CM | POA: Diagnosis not present

## 2022-01-09 DIAGNOSIS — R197 Diarrhea, unspecified: Secondary | ICD-10-CM | POA: Diagnosis not present

## 2022-01-09 DIAGNOSIS — R69 Illness, unspecified: Secondary | ICD-10-CM | POA: Diagnosis not present

## 2022-01-09 DIAGNOSIS — Z800 Family history of malignant neoplasm of digestive organs: Secondary | ICD-10-CM | POA: Diagnosis not present

## 2022-01-14 ENCOUNTER — Other Ambulatory Visit (HOSPITAL_COMMUNITY): Payer: Self-pay

## 2022-01-14 DIAGNOSIS — E7800 Pure hypercholesterolemia, unspecified: Secondary | ICD-10-CM | POA: Diagnosis not present

## 2022-01-14 MED ORDER — RESTASIS 0.05 % OP EMUL
1.0000 [drp] | Freq: Two times a day (BID) | OPHTHALMIC | 2 refills | Status: AC
  Filled 2022-01-14: qty 60, 30d supply, fill #0
  Filled 2022-07-31 – 2022-09-26 (×3): qty 60, 30d supply, fill #1

## 2022-01-15 ENCOUNTER — Other Ambulatory Visit (HOSPITAL_COMMUNITY): Payer: Self-pay

## 2022-01-15 MED ORDER — PANCRELIPASE (LIP-PROT-AMYL) 36000-114000 UNITS PO CPEP
ORAL_CAPSULE | ORAL | 3 refills | Status: AC
Start: 2022-01-15 — End: ?
  Filled 2022-01-15: qty 180, 30d supply, fill #0
  Filled 2022-05-23: qty 180, 30d supply, fill #1
  Filled 2022-09-25 – 2022-10-15 (×3): qty 180, 30d supply, fill #2

## 2022-01-16 ENCOUNTER — Other Ambulatory Visit (HOSPITAL_COMMUNITY): Payer: Self-pay

## 2022-01-16 MED ORDER — ROSUVASTATIN CALCIUM 5 MG PO TABS
ORAL_TABLET | ORAL | 2 refills | Status: DC
  Filled 2022-01-16: qty 90, 90d supply, fill #0

## 2022-01-29 ENCOUNTER — Other Ambulatory Visit (HOSPITAL_COMMUNITY): Payer: Self-pay

## 2022-01-29 MED ORDER — TRAZODONE HCL 100 MG PO TABS
ORAL_TABLET | ORAL | 2 refills | Status: DC
  Filled 2022-01-29: qty 270, 90d supply, fill #0
  Filled 2022-06-13: qty 270, 90d supply, fill #1
  Filled 2022-09-25: qty 270, 90d supply, fill #2

## 2022-01-30 ENCOUNTER — Other Ambulatory Visit (HOSPITAL_COMMUNITY): Payer: Self-pay

## 2022-02-06 DIAGNOSIS — E162 Hypoglycemia, unspecified: Secondary | ICD-10-CM | POA: Diagnosis not present

## 2022-02-06 DIAGNOSIS — C73 Malignant neoplasm of thyroid gland: Secondary | ICD-10-CM | POA: Diagnosis not present

## 2022-02-12 ENCOUNTER — Other Ambulatory Visit (HOSPITAL_COMMUNITY): Payer: Self-pay

## 2022-02-12 MED ORDER — CYCLOBENZAPRINE HCL 10 MG PO TABS
10.0000 mg | ORAL_TABLET | Freq: Two times a day (BID) | ORAL | 0 refills | Status: DC
  Filled 2022-02-12: qty 60, 30d supply, fill #0

## 2022-02-13 ENCOUNTER — Other Ambulatory Visit (HOSPITAL_COMMUNITY): Payer: Self-pay

## 2022-02-13 DIAGNOSIS — C73 Malignant neoplasm of thyroid gland: Secondary | ICD-10-CM | POA: Diagnosis not present

## 2022-02-13 DIAGNOSIS — E162 Hypoglycemia, unspecified: Secondary | ICD-10-CM | POA: Diagnosis not present

## 2022-02-13 DIAGNOSIS — M899 Disorder of bone, unspecified: Secondary | ICD-10-CM | POA: Diagnosis not present

## 2022-02-13 DIAGNOSIS — E890 Postprocedural hypothyroidism: Secondary | ICD-10-CM | POA: Diagnosis not present

## 2022-02-13 MED ORDER — LEVOTHYROXINE SODIUM 137 MCG PO TABS
ORAL_TABLET | ORAL | 4 refills | Status: AC
  Filled 2022-02-13: qty 90, 90d supply, fill #0
  Filled 2022-05-07: qty 90, 90d supply, fill #1

## 2022-02-22 ENCOUNTER — Other Ambulatory Visit (HOSPITAL_COMMUNITY): Payer: Self-pay

## 2022-02-22 DIAGNOSIS — H353132 Nonexudative age-related macular degeneration, bilateral, intermediate dry stage: Secondary | ICD-10-CM | POA: Diagnosis not present

## 2022-02-22 MED ORDER — RESTASIS 0.05 % OP EMUL
OPHTHALMIC | 11 refills | Status: DC
  Filled 2022-02-22: qty 60, 30d supply, fill #0

## 2022-02-25 ENCOUNTER — Other Ambulatory Visit (HOSPITAL_COMMUNITY): Payer: Self-pay

## 2022-02-25 MED ORDER — SERTRALINE HCL 100 MG PO TABS
200.0000 mg | ORAL_TABLET | Freq: Every day | ORAL | 2 refills | Status: DC
  Filled 2022-02-25: qty 180, 90d supply, fill #0
  Filled 2022-05-28: qty 180, 90d supply, fill #1
  Filled 2022-09-02: qty 180, 90d supply, fill #2

## 2022-03-05 ENCOUNTER — Other Ambulatory Visit (HOSPITAL_COMMUNITY): Payer: Self-pay

## 2022-03-05 DIAGNOSIS — M25551 Pain in right hip: Secondary | ICD-10-CM | POA: Diagnosis not present

## 2022-03-05 DIAGNOSIS — R0602 Shortness of breath: Secondary | ICD-10-CM | POA: Diagnosis not present

## 2022-03-05 MED ORDER — ALBUTEROL SULFATE HFA 108 (90 BASE) MCG/ACT IN AERS
INHALATION_SPRAY | RESPIRATORY_TRACT | 0 refills | Status: DC
  Filled 2022-03-05: qty 18, 17d supply, fill #0

## 2022-03-06 DIAGNOSIS — H353132 Nonexudative age-related macular degeneration, bilateral, intermediate dry stage: Secondary | ICD-10-CM | POA: Diagnosis not present

## 2022-03-07 DIAGNOSIS — M7702 Medial epicondylitis, left elbow: Secondary | ICD-10-CM | POA: Diagnosis not present

## 2022-03-07 DIAGNOSIS — M7061 Trochanteric bursitis, right hip: Secondary | ICD-10-CM | POA: Diagnosis not present

## 2022-03-13 ENCOUNTER — Other Ambulatory Visit: Payer: Self-pay

## 2022-03-13 ENCOUNTER — Other Ambulatory Visit (HOSPITAL_COMMUNITY): Payer: Self-pay

## 2022-03-15 ENCOUNTER — Other Ambulatory Visit (HOSPITAL_COMMUNITY): Payer: Self-pay

## 2022-03-15 MED ORDER — FROVATRIPTAN SUCCINATE 2.5 MG PO TABS
ORAL_TABLET | ORAL | 3 refills | Status: DC
  Filled 2022-03-15: qty 27, 27d supply, fill #0

## 2022-03-18 DIAGNOSIS — M7061 Trochanteric bursitis, right hip: Secondary | ICD-10-CM | POA: Diagnosis not present

## 2022-03-18 DIAGNOSIS — M7702 Medial epicondylitis, left elbow: Secondary | ICD-10-CM | POA: Diagnosis not present

## 2022-03-19 ENCOUNTER — Other Ambulatory Visit (HOSPITAL_COMMUNITY): Payer: Self-pay

## 2022-03-19 DIAGNOSIS — G43009 Migraine without aura, not intractable, without status migrainosus: Secondary | ICD-10-CM | POA: Diagnosis not present

## 2022-03-19 MED ORDER — ELETRIPTAN HYDROBROMIDE 40 MG PO TABS
ORAL_TABLET | ORAL | 2 refills | Status: DC
  Filled 2022-03-19: qty 12, 30d supply, fill #0

## 2022-03-25 DIAGNOSIS — M7061 Trochanteric bursitis, right hip: Secondary | ICD-10-CM | POA: Diagnosis not present

## 2022-03-25 DIAGNOSIS — M7702 Medial epicondylitis, left elbow: Secondary | ICD-10-CM | POA: Diagnosis not present

## 2022-03-28 ENCOUNTER — Other Ambulatory Visit (HOSPITAL_COMMUNITY): Payer: Self-pay

## 2022-03-28 DIAGNOSIS — M7702 Medial epicondylitis, left elbow: Secondary | ICD-10-CM | POA: Diagnosis not present

## 2022-03-28 DIAGNOSIS — M7061 Trochanteric bursitis, right hip: Secondary | ICD-10-CM | POA: Diagnosis not present

## 2022-03-29 ENCOUNTER — Other Ambulatory Visit (HOSPITAL_COMMUNITY): Payer: Self-pay

## 2022-03-29 MED ORDER — CYCLOBENZAPRINE HCL 10 MG PO TABS
10.0000 mg | ORAL_TABLET | Freq: Two times a day (BID) | ORAL | 1 refills | Status: DC
  Filled 2022-03-29: qty 60, 30d supply, fill #0
  Filled 2022-05-23: qty 60, 30d supply, fill #1

## 2022-04-01 DIAGNOSIS — M7702 Medial epicondylitis, left elbow: Secondary | ICD-10-CM | POA: Diagnosis not present

## 2022-04-01 DIAGNOSIS — M7061 Trochanteric bursitis, right hip: Secondary | ICD-10-CM | POA: Diagnosis not present

## 2022-04-02 ENCOUNTER — Other Ambulatory Visit (HOSPITAL_COMMUNITY): Payer: Self-pay

## 2022-04-04 DIAGNOSIS — M7702 Medial epicondylitis, left elbow: Secondary | ICD-10-CM | POA: Diagnosis not present

## 2022-04-04 DIAGNOSIS — M7061 Trochanteric bursitis, right hip: Secondary | ICD-10-CM | POA: Diagnosis not present

## 2022-04-05 ENCOUNTER — Other Ambulatory Visit (HOSPITAL_COMMUNITY): Payer: Self-pay

## 2022-04-18 ENCOUNTER — Other Ambulatory Visit (HOSPITAL_COMMUNITY): Payer: Self-pay

## 2022-05-07 ENCOUNTER — Other Ambulatory Visit (HOSPITAL_COMMUNITY): Payer: Self-pay

## 2022-05-08 ENCOUNTER — Other Ambulatory Visit (HOSPITAL_COMMUNITY): Payer: Self-pay

## 2022-05-08 MED ORDER — ROSUVASTATIN CALCIUM 5 MG PO TABS
5.0000 mg | ORAL_TABLET | Freq: Every day | ORAL | 1 refills | Status: DC
  Filled 2022-05-08: qty 30, 30d supply, fill #0
  Filled 2022-06-13: qty 30, 30d supply, fill #1

## 2022-05-09 DIAGNOSIS — R059 Cough, unspecified: Secondary | ICD-10-CM | POA: Diagnosis not present

## 2022-05-11 ENCOUNTER — Emergency Department (HOSPITAL_BASED_OUTPATIENT_CLINIC_OR_DEPARTMENT_OTHER)
Admission: EM | Admit: 2022-05-11 | Discharge: 2022-05-11 | Disposition: A | Discharge status: Home / Self Care | Payer: Medicare HMO | Attending: Emergency Medicine | Admitting: Emergency Medicine

## 2022-05-11 ENCOUNTER — Emergency Department (HOSPITAL_BASED_OUTPATIENT_CLINIC_OR_DEPARTMENT_OTHER): Payer: Medicare HMO | Admitting: Radiology

## 2022-05-11 ENCOUNTER — Other Ambulatory Visit: Payer: Self-pay

## 2022-05-11 DIAGNOSIS — X58XXXA Exposure to other specified factors, initial encounter: Secondary | ICD-10-CM | POA: Diagnosis not present

## 2022-05-11 DIAGNOSIS — T17900A Unspecified foreign body in respiratory tract, part unspecified causing asphyxiation, initial encounter: Secondary | ICD-10-CM | POA: Insufficient documentation

## 2022-05-11 DIAGNOSIS — Z85850 Personal history of malignant neoplasm of thyroid: Secondary | ICD-10-CM | POA: Insufficient documentation

## 2022-05-11 DIAGNOSIS — T17320A Food in larynx causing asphyxiation, initial encounter: Secondary | ICD-10-CM | POA: Diagnosis not present

## 2022-05-11 DIAGNOSIS — S1095XA Superficial foreign body of unspecified part of neck, initial encounter: Secondary | ICD-10-CM | POA: Diagnosis not present

## 2022-05-11 DIAGNOSIS — Z96642 Presence of left artificial hip joint: Secondary | ICD-10-CM | POA: Diagnosis not present

## 2022-05-11 DIAGNOSIS — R6889 Other general symptoms and signs: Secondary | ICD-10-CM

## 2022-05-11 NOTE — ED Provider Notes (Signed)
[facility] EMERGENCY DEPT Provider Note   CSN: [id number] Arrival date & time: 05/11/22  1226     History  Chief Complaint  Patient presents with   Cough   Choking    Rebecca Marshall is a 71 y.o. female.  71 year old female presents today for concern of aspirating foreign body.  This occurred about 1 hour ago.  Patient states she was taking 2 500 mg Tylenol capsules.  She states 1 she was able to bring back up, but the other 1 she still feels stuck towards the right side of her neck.  Since this is happened she has been complaining of some shortness of breath, as well as coughing since then.  Denies prior history of aspirated foreign bodies, or esophageal food impaction.  The history is provided by the patient. No language interpreter was used.       Home Medications Prior to Admission medications   Medication Sig Start Date End Date Taking? Authorizing Provider  acetaminophen (TYLENOL) 500 MG tablet Take 1,000 mg by mouth every 8 (eight) hours as needed for mild pain or moderate pain.     [provider]  albuterol (VENTOLIN HFA) 108 (90 Base) MCG/ACT inhaler Inhale 2 puffs as needed every 4 hrs. 03/05/22     amoxicillin-clavulanate (AUGMENTIN) 875-125 MG tablet Take 1 tablet by mouth every 12 hours for 10 days 01/01/22     colestipol (COLESTID) 1 g tablet Take 1 tablet (1 g total) by mouth 2 times daily. 07/26/21     CREON 36000-114000 units CPEP capsule Take 2 capsules by mouth 3 times daily with meals. 07/26/21     cyclobenzaprine (FLEXERIL) 10 MG tablet Take 1 tablet by mouth 2 times daily. 03/29/22     cycloSPORINE (RESTASIS) 0.05 % ophthalmic emulsion Place 1 drop in each eye twice a day as directed. 11/30/20     diazepam (VALIUM) 10 MG tablet Take 10 mg by mouth daily as needed. 02/15/20   [provider]  diazepam (VALIUM) 10 MG tablet Take 1 tablet by mouth once daily as needed for headache 12/17/21     eletriptan (RELPAX) 40 MG tablet Take by  mouth one at the beginning of headache and may repeat once in 2  Hours for maximum of 2 tabs in 24hours 04/05/15   Dickie La, MD  eletriptan (RELPAX) 40 MG tablet Take 1 tablet by mouth if needed for migraine. May repeat after 2 hours if needed. 03/19/22     flecainide (TAMBOCOR) 150 MG tablet Take 1 tablet (150 mg total) by mouth 2 (two) times daily. 07/17/21   Lelon Perla, MD  frovatriptan (FROVA) 2.5 MG tablet Take 1 tablet by mouth once a day, repeat in 2 hours if needed 03/15/22     levothyroxine (SYNTHROID) 137 MCG tablet Take 1 tablet by mouth once a day. 02/13/22     levothyroxine (SYNTHROID) 150 MCG tablet TAKE 1 TABLET BY MOUTH EVERY MORNING ON AN EMPTY STOMACH 12/06/20 12/06/21  Jacelyn Pi, MD  levothyroxine (SYNTHROID) 150 MCG tablet Take 1 tablet by mouth once daily in the morning on an empty stomach 04/04/21     levothyroxine (SYNTHROID) 150 MCG tablet Take 1 tablet by mouth once daily in the morning on an empty stomach 04/04/21     lipase/protease/amylase (CREON) 36000 UNITS CPEP capsule Take 2 capsules by mouth with meals 3 times daily. 01/15/22     mupirocin cream (BACTROBAN) 2 % Apply to affected areas 2 times a day  for 7 days 01/01/22     mupirocin ointment (BACTROBAN) 2 % Apply to the affected area(s) 2 times a day for 7 days 01/02/22     Pancrelipase, Lip-Prot-Amyl, (CREON PO) Take by mouth 3 (three) times daily. Pt takes 2 tablets with each meal.    [provider]  primidone (MYSOLINE) 50 MG tablet Take 1 tablet by mouth at bedtime. 12/17/21   Melvenia Beam, MD  RESTASIS 0.05 % ophthalmic emulsion Instill 1 drop into both eyes twice a day 01/14/22     RESTASIS 0.05 % ophthalmic emulsion Instill 1 drop into both eyes twice a day as directed 02/22/22     rosuvastatin (CRESTOR) 5 MG tablet Take 1 tablet by mouth daily. 12/17/21     rosuvastatin (CRESTOR) 5 MG tablet Take 1 tablet by mouth once a day 01/16/22     rosuvastatin (CRESTOR) 5 MG tablet Take 1 tablet by mouth  daily. 05/08/22     sertraline (ZOLOFT) 100 MG tablet Take 2 tablets by mouth once daily. 02/25/22     traZODone (DESYREL) 100 MG tablet Take 3 tablets by mouth at bedtime daily 01/29/22         Allergies    Nsaids and Codeine    Review of Systems   Review of Systems  All other systems reviewed and are negative.   Physical Exam Updated Vital Signs BP 120/78 (BP Location: Left Arm)   Pulse 87   Temp 98.7 F (37.1 C) (Oral)   Resp 18   Ht '5\' 6"'$  (1.676 m)   Wt 81.2 kg   SpO2 97%   BMI 28.89 kg/m  Physical Exam Vitals and nursing note reviewed.  Constitutional:      General: She is not in acute distress.    Appearance: Normal appearance. She is not ill-appearing.     Comments: Patient appears uncomfortable.  HENT:     Head: Normocephalic and atraumatic.     Nose: Nose normal.  Eyes:     Conjunctiva/sclera: Conjunctivae normal.  Cardiovascular:     Rate and Rhythm: Normal rate and regular rhythm.  Pulmonary:     Effort: Pulmonary effort is normal. No respiratory distress.     Breath sounds: Normal breath sounds. No wheezing or rales.  Musculoskeletal:        General: No deformity.  Skin:    Findings: No rash.  Neurological:     Mental Status: She is alert.     ED Results / Procedures / Treatments   Labs (all labs ordered are listed, but only abnormal results are displayed) Labs Reviewed - No data to display  EKG None  Radiology No results found.  Procedures Procedures    Medications Ordered in ED Medications - No data to display  ED Course/ Medical Decision Making/ A&P                           Medical Decision Making Amount and/or Complexity of Data Reviewed Radiology: ordered.   Medical Decision Making / ED Course   This patient presents to the ED for concern of foreign body sensation, coughing, this involves an extensive number of treatment options, and is a complaint that carries with it a high risk of complications and morbidity.  The  differential diagnosis includes foreign body aspiration  MDM: 71 year old female presents today of sinus [place] foreign body.  This occurred just prior to arrival.  Patient without acute distress. Tolerated po intake without  difficulty. Without stridor or wheezing. Patient evaluated by attending Dr. Kathrynn Humble. Patient would like to be discharged and follow up with PCP. Return precautions discussed. Chest x ray without acute concerns. Patient discharged in stable condition.   Lab Tests: -I ordered, reviewed, and interpreted labs.   The pertinent results include:   Labs Reviewed - No data to display    EKG  EKG Interpretation  Date/Time:    Ventricular Rate:    PR Interval:    QRS Duration:   QT Interval:    QTC Calculation:   R Axis:     Text Interpretation:           Imaging Studies ordered: I ordered imaging studies including chest x ray I independently visualized and interpreted imaging. I agree with the radiologist interpretation   Medicines ordered and prescription drug management: No orders of the defined types were placed in this encounter.   -I have reviewed the patients home medicines and have made adjustments as needed  Co morbidities that complicate the patient evaluation  Past Medical History:  Diagnosis Date   Acne nodule 11/26/2011   ADENOCARCINOMA, THYROID GLAND, PAPILLARY 05/17/2009   04/2009 Two surgeries for thyroid cancer Path report from last surgery :THYROID, LEFT, HEMITHYROIDECTOMY: - MULTIPLE FOCI OF FOLLICULAR VARIANT OR PAPILLARY THYROID CARCINOMA, 1.7 CM IN GREATEST DIMENSION, CONFINED WITHIN THE THYROID TISSUE. - ANGIOLYMPHATIC INVASION PRESENT. - ISTHMUS RESECTION MARGIN INVOLVED BY TUMOR. - FOLLICULAR ADENOMA(S) WITH ASSOCIATED LYMPHOCYTIC THYROIDITIS.      AK (actinic keratosis) 04/16/2012   ALLERGIC RHINITIS 01/30/2009   Qualifier: Diagnosis of  By: Andria Frames MD, [place] 09/03/2010   Long standing depression  with anxiety. Good response to medications initially. Has been to [facility].     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 Had revision by Dr Berenice Primas in 2011 with  ASR .Marland Kitchen     Atrial dysrhythmia    FOLLOWED BY DR Caryl Comes WAS HAVING PALPITATIONS AND SYNCOPAL EPISODES ONSET 9 YEARS AGO ; STARTED ON FLECAIINIDE , PER PATIENT TOLERATES WELL, LAST SYNCOPAL EPISODE WAS OVER 5 YEARS AGO ; SEE LOV IN  EPIC    Atrial tachycardia (HCC) 06/05/2011   \Treated with flecainide-2011 2010-QRS duration 88 ms 2011 QRS duration 88 ms 2014 QRS duration 98 ms 2014 QRS '120mg'$    120 ms     Bilateral bunions 12/18/2010   Bilateral knee pain 06/29/2015   Cancer ([place])    thyroid-status post resection on replacement Dr. Chalmers Cater   Clinical depression 03/06/2016   Cognitive changes 8/67/6720   Complication of anesthesia    FENTANYL CAUSED ANAPHYLAXIS DURING CA COLONOSCOPY   Depression    Dysrhythmia    Dr Caryl Comes monitoring patient, atrial tachycardia under control, cardiac clearance 09/10/17   Family history of diabetes mellitus (DM) 05/11/2014   Fibromyalgia    Gait abnormality 12/18/2010   GE reflux    GERD 07/14/2007   Qualifier: Diagnosis of  By: Nori Riis MD, Clarise Cruz     Headache    migraines   Headache upon awakening 06/05/2011   Herniated lumbar disc without myelopathy 12/10/2013   Had surgery by neurosurgery in December, 2014.L4-5 laminotomy and microdiscectomy Dr Margreta Journey Done at [facility]    Hip pain, chronic 04/05/2015   Status post THR plus revision.    Hypoxemia 01/30/2015   Memory change 05/12/2014  Reports problems with short-term memory, focusing, concentration in the last 6-12 months. Problems were significant enough that she had to quit her part-time job is fairly distressed about this.    Metatarsalgia 12/18/2010   Nausea & vomiting 06/05/2011   Nocturia more than twice per night 01/30/2015   Nonallopathic lesion  of cervical region 11/26/2011   Nonallopathic lesion of lumbosacral region 11/26/2011   OSA on CPAP 01/30/2015   Osteoporosis    PONV (postoperative nausea and vomiting)    did not have nausea/vomiting with nissen fundoplication done 00/9381 at [facility]   Poor compliance with CPAP treatment 03/24/2017   Rash and nonspecific skin eruption 01/09/2016    located in the 10 o'clock position on Right breast     Severe obesity (BMI >= 40) ([place]) 01/30/2015   Sleep apnea 10/2015   x 1 year, CPAP   Snorings 01/30/2015   Status post Nissen fundoplication 82/05/9370   Subacute cough 03/27/2012   Thyroid disease    Well adult health check 06/05/2011      Dispostion: Patient discharged in stable condition.   Final Clinical Impression(s) / ED Diagnoses Final diagnoses:  Sensation of foreign body    Rx / DC Orders ED Discharge Orders     None         Evlyn Courier, PA-C 05/11/22 [place], Ankit, MD 05/13/22 1224

## 2022-05-11 NOTE — Discharge Instructions (Addendum)
Your x-ray did not show any concerning findings.  You are able to tolerate p.o. intake.  If you have any worsening or concerning symptoms please return to the emergency room otherwise follow-up with your primary

## 2022-05-11 NOTE — ED Triage Notes (Signed)
Patient arrives with complaints of a pill stuck in her throat (capsulated tylenol). Patient states that she feels the pill in her throat.

## 2022-05-15 ENCOUNTER — Other Ambulatory Visit (HOSPITAL_COMMUNITY): Payer: Self-pay

## 2022-05-16 DIAGNOSIS — E890 Postprocedural hypothyroidism: Secondary | ICD-10-CM | POA: Diagnosis not present

## 2022-05-20 ENCOUNTER — Other Ambulatory Visit (HOSPITAL_COMMUNITY): Payer: Self-pay

## 2022-05-20 MED ORDER — LEVOTHYROXINE SODIUM 137 MCG PO TABS
ORAL_TABLET | ORAL | 4 refills | Status: DC
Start: 2022-05-20 — End: 2022-07-30
  Filled 2022-05-20: qty 78, 90d supply, fill #0

## 2022-05-21 ENCOUNTER — Ambulatory Visit: Payer: Medicare HMO | Admitting: Adult Health

## 2022-05-23 ENCOUNTER — Other Ambulatory Visit (HOSPITAL_COMMUNITY): Payer: Self-pay

## 2022-05-28 ENCOUNTER — Other Ambulatory Visit (HOSPITAL_COMMUNITY): Payer: Self-pay

## 2022-05-29 ENCOUNTER — Other Ambulatory Visit (HOSPITAL_COMMUNITY): Payer: Self-pay

## 2022-05-30 ENCOUNTER — Other Ambulatory Visit (HOSPITAL_COMMUNITY): Payer: Self-pay

## 2022-05-31 ENCOUNTER — Other Ambulatory Visit (HOSPITAL_COMMUNITY): Payer: Self-pay

## 2022-06-04 DIAGNOSIS — G894 Chronic pain syndrome: Secondary | ICD-10-CM | POA: Diagnosis not present

## 2022-06-04 DIAGNOSIS — R0609 Other forms of dyspnea: Secondary | ICD-10-CM | POA: Diagnosis not present

## 2022-06-04 DIAGNOSIS — G43009 Migraine without aura, not intractable, without status migrainosus: Secondary | ICD-10-CM | POA: Diagnosis not present

## 2022-06-04 DIAGNOSIS — R079 Chest pain, unspecified: Secondary | ICD-10-CM | POA: Diagnosis not present

## 2022-06-04 DIAGNOSIS — R052 Subacute cough: Secondary | ICD-10-CM | POA: Diagnosis not present

## 2022-06-05 ENCOUNTER — Other Ambulatory Visit (HOSPITAL_COMMUNITY): Payer: Self-pay

## 2022-06-06 ENCOUNTER — Other Ambulatory Visit (HOSPITAL_COMMUNITY): Payer: Self-pay

## 2022-06-13 ENCOUNTER — Other Ambulatory Visit (HOSPITAL_COMMUNITY): Payer: Self-pay

## 2022-06-13 DIAGNOSIS — M25561 Pain in right knee: Secondary | ICD-10-CM | POA: Diagnosis not present

## 2022-06-18 ENCOUNTER — Other Ambulatory Visit (HOSPITAL_COMMUNITY): Payer: Self-pay

## 2022-06-20 ENCOUNTER — Other Ambulatory Visit (HOSPITAL_COMMUNITY): Payer: Self-pay

## 2022-07-11 DIAGNOSIS — M25561 Pain in right knee: Secondary | ICD-10-CM | POA: Diagnosis not present

## 2022-07-15 ENCOUNTER — Other Ambulatory Visit (HOSPITAL_COMMUNITY): Payer: Self-pay

## 2022-07-16 ENCOUNTER — Other Ambulatory Visit (HOSPITAL_COMMUNITY): Payer: Self-pay

## 2022-07-16 MED ORDER — CYCLOBENZAPRINE HCL 10 MG PO TABS
10.0000 mg | ORAL_TABLET | Freq: Every evening | ORAL | 0 refills | Status: DC
  Filled 2022-07-16: qty 90, 90d supply, fill #0

## 2022-07-17 ENCOUNTER — Other Ambulatory Visit (HOSPITAL_COMMUNITY): Payer: Self-pay

## 2022-07-17 MED ORDER — TRAMADOL HCL 50 MG PO TABS
50.0000 mg | ORAL_TABLET | Freq: Four times a day (QID) | ORAL | 0 refills | Status: DC | PRN
Start: 2022-07-16 — End: 2022-08-01
  Filled 2022-07-17: qty 20, 5d supply, fill #0

## 2022-07-18 DIAGNOSIS — E890 Postprocedural hypothyroidism: Secondary | ICD-10-CM | POA: Diagnosis not present

## 2022-07-18 DIAGNOSIS — S83241A Other tear of medial meniscus, current injury, right knee, initial encounter: Secondary | ICD-10-CM | POA: Diagnosis not present

## 2022-07-19 ENCOUNTER — Other Ambulatory Visit (HOSPITAL_COMMUNITY): Payer: Self-pay

## 2022-07-19 MED ORDER — LEVOTHYROXINE SODIUM 137 MCG PO TABS
137.0000 ug | ORAL_TABLET | Freq: Every day | ORAL | 4 refills | Status: DC
Start: 2022-07-19 — End: 2022-07-30
  Filled 2022-07-19: qty 78, 90d supply, fill #0

## 2022-07-19 MED ORDER — LEVOTHYROXINE SODIUM 75 MCG PO TABS
75.0000 ug | ORAL_TABLET | ORAL | 5 refills | Status: DC
  Filled 2022-07-19: qty 4, 28d supply, fill #0

## 2022-07-23 ENCOUNTER — Other Ambulatory Visit (HOSPITAL_COMMUNITY): Payer: Self-pay

## 2022-07-26 ENCOUNTER — Telehealth: Payer: Self-pay | Admitting: Cardiology

## 2022-07-26 NOTE — Telephone Encounter (Signed)
   Pre-operative Risk Assessment    Patient Name: Rebecca Marshall  DOB: [birth year] MRN: [id number]      Request for Surgical Clearance    Procedure:   Right Knee Scope   Date of Surgery:  Clearance TBD                                 Surgeon:  Dr Dorna Leitz  Surgeon's Group or Practice Name:  [facility] [facility]  Phone number:  [phone] Fax number:  [phone]   Type of Clearance Requested:   - Medical  - Pharmacy:  Hold        Type of Anesthesia:  General    Additional requests/questions:  Please advise surgeon/provider what medications should be held.  Signed, April Henson   07/26/2022, 3:56 PM  --

## 2022-07-26 NOTE — Telephone Encounter (Signed)
   Name: Marta Bouie  DOB: 17-Aug-1951  MRN: [id number]  Primary Cardiologist: Virl Axe, MD  Chart reviewed as part of pre-operative protocol coverage. Because of Lillyonna Armstead Logie's past medical history and time since last visit, she will require a follow-up in-office visit in order to better assess preoperative cardiovascular risk.  Pre-op covering staff: - Please schedule appointment and call patient to inform them. If patient already had an upcoming appointment within acceptable timeframe, please add "pre-op clearance" to the appointment notes so provider is aware. - Please contact requesting surgeon's office via preferred method (i.e, phone, fax) to inform them of need for appointment prior to surgery.  Lenna Sciara, NP  07/26/2022, 4:59 PM

## 2022-07-26 NOTE — Telephone Encounter (Signed)
Pt scheduled to see Almyra Deforest, PA-C on 07/30/22 for preop clearance

## 2022-07-29 ENCOUNTER — Other Ambulatory Visit (HOSPITAL_COMMUNITY): Payer: Self-pay

## 2022-07-30 ENCOUNTER — Other Ambulatory Visit (HOSPITAL_COMMUNITY): Payer: Self-pay

## 2022-07-30 ENCOUNTER — Ambulatory Visit: Payer: Medicare HMO | Attending: Physician Assistant | Admitting: Physician Assistant

## 2022-07-30 ENCOUNTER — Encounter: Payer: Self-pay | Admitting: Physician Assistant

## 2022-07-30 VITALS — BP 102/62 | HR 68 | Ht 66.5 in | Wt 183.0 lb

## 2022-07-30 DIAGNOSIS — I4719 Other supraventricular tachycardia: Secondary | ICD-10-CM

## 2022-07-30 DIAGNOSIS — Z01810 Encounter for preprocedural cardiovascular examination: Secondary | ICD-10-CM | POA: Diagnosis not present

## 2022-07-30 MED ORDER — FLECAINIDE ACETATE 150 MG PO TABS
150.0000 mg | ORAL_TABLET | Freq: Two times a day (BID) | ORAL | 3 refills | Status: DC
  Filled 2022-07-30: qty 20, 10d supply, fill #0
  Filled 2022-07-31: qty 160, 80d supply, fill #0
  Filled 2022-11-04: qty 180, 90d supply, fill #1
  Filled 2023-01-27: qty 180, 90d supply, fill #2
  Filled 2023-05-06: qty 180, 90d supply, fill #3
  Filled 2023-05-07: qty 160, 80d supply, fill #3

## 2022-07-30 NOTE — Patient Instructions (Signed)
Medication Instructions:  Your physician recommends that you continue on your current medications as directed. Please refer to the Current Medication list given to you today.  *If you need a refill on your cardiac medications before your next appointment, please call your pharmacy*  Lab Work: NONE ordered at this time of appointment   If you have labs (blood work) drawn today and your tests are completely normal, you will receive your results only by: [place] (if you have MyChart) OR A paper copy in the mail If you have any lab test that is abnormal or we need to change your treatment, we will call you to review the results.  Testing/Procedures: NONE ordered at this time of appointment   Follow-Up: At [facility], you and your health needs are our priority.  As part of our continuing mission to provide you with exceptional heart care, we have created designated Provider Care Teams.  These Care Teams include your primary Cardiologist (physician) and Advanced Practice Providers (APPs -  Physician Assistants and Nurse Practitioners) who all work together to provide you with the care you need, when you need it.  We recommend signing up for the patient portal called "MyChart".  Sign up information is provided on this After Visit Summary.  MyChart is used to connect with patients for Virtual Visits (Telemedicine).  Patients are able to view lab/test results, encounter notes, upcoming appointments, etc.  Non-urgent messages can be sent to your provider as well.   To learn more about what you can do with MyChart, go to [email or web address].    Your next appointment:   1 year(s)  The format for your next appointment:   In Person  Provider:   Kirk Ruths, MD     Other Instructions   Important Information About Sugar

## 2022-07-30 NOTE — Telephone Encounter (Signed)
   Patient Name: Rebecca Marshall  DOB: Mar 12, 1951 MRN: [id number]  Primary Cardiologist: Kirk Ruths, MD  Chart reviewed as part of pre-operative protocol coverage. Given past medical history and time since last visit, based on ACC/AHA guidelines, Alisson Rozell is at acceptable risk for the planned procedure without further cardiovascular testing.   Patient was seen in the cardiology office today on 07/30/2022 for preoperative clearance prior to her right knee surgery by Dr. Berenice Primas.  She has no recent exertional chest pain or worsening dyspnea.  She has no prior history of CAD.  Her last echocardiogram and the Myoview in 2017 were normal.  She has no problem achieving more than 4 METS of activity.  From the cardiac perspective, no additional work-up is needed.  Her RCRI perioperative cardiac risk is class I, 0.4% risk of major cardiac event.  Please call with questions.  [place], [place] 07/30/2022, 9:20 AM

## 2022-07-30 NOTE — Progress Notes (Unsigned)
Cardiology Office Note:    Date:  07/31/2022   ID:  Gery Pray, DOB April 03, 1951, MRN [id number]  PCP:  Kathyrn Lass, [place] Providers Cardiologist:  Kirk Ruths, MD Electrophysiologist:  Virl Axe, MD     Referring MD: Kathyrn Lass, MD   Chief Complaint  Patient presents with   Follow-up    Seen for Dr. Stanford Breed    History of Present Illness:    Rebecca Marshall is a 71 y.o. female with a hx of neurally mediated syncope, atrial tachycardia controlled on flecainide, and reported history of bradycardia.  Echocardiogram in March 2017 showed normal EF, grade 2 DD, trace aortic insufficiency, moderate LAE and mild right atrial enlargement.  Carotid Doppler in March 2017 was normal.  Nuclear stress test in March 2017 shows EF 72%, breast attenuation but no ischemia.  Abdominal ultrasound in November 2021 showed no abdominal aortic aneurysm, mildly dilated left iliac artery at 1.6 cm.  Patient was last seen by Dr. Stanford Breed in November 2022 at which time she was doing well.  Patient presents today for follow-up.  She has upcoming right knee surgery by Dr. Dorna Leitz.  51-monthago, she was able to do everything by herself, her current activities mainly limited by knee issue.  Despite so, she is still able to walk 2 blocks away from all her home and back without any issue.  She is clearly able to accomplish more than 4 METS of activity.  She denies any recent chest pain, worsening dyspnea, lower extremity edema, orthopnea or PND.  He is at acceptable risk to proceed with knee surgery.  She can follow-up with Dr. CStanford Breedin 1 year.  We will refill her flecainide.  She is no longer taking her 5 mg of Crestor that was prescribed by her PCP, however her most recent lipid panel in April is not bad.  Past Medical History:  Diagnosis Date   Acne nodule 11/26/2011   ADENOCARCINOMA, THYROID GLAND, PAPILLARY 05/17/2009   04/2009 Two surgeries for thyroid cancer Path report from  last surgery :THYROID, LEFT, HEMITHYROIDECTOMY: - MULTIPLE FOCI OF FOLLICULAR VARIANT OR PAPILLARY THYROID CARCINOMA, 1.7 CM IN GREATEST DIMENSION, CONFINED WITHIN THE THYROID TISSUE. - ANGIOLYMPHATIC INVASION PRESENT. - ISTHMUS RESECTION MARGIN INVOLVED BY TUMOR. - FOLLICULAR ADENOMA(S) WITH ASSOCIATED LYMPHOCYTIC THYROIDITIS.      AK (actinic keratosis) 04/16/2012   ALLERGIC RHINITIS 01/30/2009   Qualifier: Diagnosis of  By: HAndria FramesMD, WEspanola12/08/2010   Long standing depression with anxiety. Good response to medications initially. [facility]     Arthritis    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 08/11/2007   LEFT HKDT:OIZTIhip replacement (Dr GBerenice Primas with recalled hardware. Had revision by Dr GBerenice Primasin 2011 with  ASR ..Marland Kitchen    Atrial dysrhythmia    FOLLOWED BY DR KCaryl ComesWAS HAVING PALPITATIONS AND SYNCOPAL EPISODES ONSET 9 YEARS AGO ; STARTED ON FLECAIINIDE , PER PATIENT TOLERATES WELL, LAST SYNCOPAL EPISODE WAS OVER 5 YEARS AGO ; SEE LOV IN  EPIC    Atrial tachycardia 06/05/2011   \Treated with flecainide-2011 2010-QRS duration 88 ms 2011 QRS duration 88 ms 2014 QRS duration 98 ms 2014 QRS '120mg'$    120 ms     Bilateral bunions 12/18/2010   Bilateral knee pain 06/29/2015   Cancer ([place]elia    thyroid-status post resection on replacement Dr. BChalmers Cater  Clinical depression 03/06/2016  Cognitive changes 7/78/2423   Complication of anesthesia    FENTANYL CAUSED ANAPHYLAXIS DURING CA COLONOSCOPY   Depression    Dysrhythmia    Dr Caryl Comes monitoring patient, atrial tachycardia under control, cardiac clearance 09/10/17   Family history of diabetes mellitus (DM) 05/11/2014   Fibromyalgia    Gait abnormality 12/18/2010   GE reflux    GERD 07/14/2007   Qualifier: Diagnosis of  By: Nori Riis MD, Clarise Cruz     Headache    migraines   Headache upon awakening 06/05/2011   Herniated lumbar disc without myelopathy 12/10/2013   Had surgery by neurosurgery in December, 2014.L4-5  laminotomy and microdiscectomy Dr Margreta Journey Done at [facility]    Hip pain, chronic 04/05/2015   Status post THR plus revision.    Hypoxemia 01/30/2015   Memory change 05/12/2014   Reports problems with short-term memory, focusing, concentration in the last 6-12 months. Problems were significant enough that she had to quit her part-time job is fairly distressed about this.    Metatarsalgia 12/18/2010   Nausea & vomiting 06/05/2011   Nocturia more than twice per night 01/30/2015   Nonallopathic lesion of cervical region 11/26/2011   Nonallopathic lesion of lumbosacral region 11/26/2011   OSA on CPAP 01/30/2015   Osteoporosis    PONV (postoperative nausea and vomiting)    did not have nausea/vomiting with nissen fundoplication done 53/6144 at [facility]   Poor compliance with CPAP treatment 03/24/2017   Rash and nonspecific skin eruption 01/09/2016    located in the 10 o'clock position on Right breast     Severe obesity (BMI >= 40) ([place]) 01/30/2015   Sleep apnea 10/2015   x 1 year, CPAP   Snorings 01/30/2015   Status post Nissen fundoplication 31/01/4007   Subacute cough 03/27/2012   Thyroid disease    Well adult health check 06/05/2011    Past Surgical History:  Procedure Laterality Date   ABDOMINAL HYSTERECTOMY     BREAST EXCISIONAL BIOPSY Right 25+ yrs ago   benign   caroitid dopplers Bilateral 11/2015   normal   CATARACT EXTRACTION Bilateral 09/05/15, 09/12/15   CHOLECYSTECTOMY     COLONOSCOPY  04/2011   ESOPHAGEAL MANOMETRY N/A 03/31/2017   Procedure: ESOPHAGEAL MANOMETRY (EM);  Surgeon: Ronald Lobo, MD;  Location: [facility];  Service: Endoscopy;  Laterality: N/A;   HIATAL HERNIA REPAIR N/A 06/05/2018   Procedure: LAPAROSCOPIC  ENTERO-LYSIS  ERAS PATHWAY;  Surgeon: Johnathan Hausen, MD;  Location: [facility];  Service: General;  Laterality: N/A;   LAPAROSCOPIC NISSEN FUNDOPLICATION N/A 67/02/1949   Procedure: LAPAROSCOPIC NISSEN FUNDOPLICATION;  Surgeon: Johnathan Hausen, MD;   Location: [facility];  Service: General;  Laterality: N/A;   THYROIDECTOMY  04/2009   papillary carcimao Dr. Erik Obey   TOE SURGERY     bone spur removed - rt great toe   TOTAL HIP ARTHROPLASTY  2009 and 2011   L hip    TOTAL HIP ARTHROPLASTY Right 09/12/2017   Procedure: TOTAL HIP ARTHROPLASTY ANTERIOR APPROACH;  Surgeon: Dorna Leitz, MD;  Location: [facility];  Service: Orthopedics;  Laterality: Right;   TUBAL LIGATION     UPPER GI ENDOSCOPY  06/05/2018   Procedure: UPPER GI ENDOSCOPY;  Surgeon: Johnathan Hausen, MD;  Location: [facility];  Service: General;;    Current Medications: Current Meds  Medication Sig   acetaminophen (TYLENOL) 500 MG tablet Take 1,000 mg by mouth every 8 (eight) hours as needed for mild pain or moderate pain.    albuterol (  VENTOLIN HFA) 108 (90 Base) MCG/ACT inhaler Inhale 2 puffs as needed every 4 hrs.   amoxicillin-clavulanate (AUGMENTIN) 875-125 MG tablet Take 1 tablet by mouth every 12 hours for 10 days   colestipol (COLESTID) 1 g tablet Take 1 tablet (1 g total) by mouth 2 times daily.   CREON 36000-114000 units CPEP capsule Take 2 capsules by mouth 3 times daily with meals.   cyclobenzaprine (FLEXERIL) 10 MG tablet Take 1 tablet (10 mg total) by mouth at bedtime.   cycloSPORINE (RESTASIS) 0.05 % ophthalmic emulsion Place 1 drop in each eye twice a day as directed.   diazepam (VALIUM) 10 MG tablet Take 10 mg by mouth daily as needed.   diazepam (VALIUM) 10 MG tablet Take 1 tablet by mouth once daily as needed for headache   eletriptan (RELPAX) 40 MG tablet Take by mouth one at the beginning of headache and may repeat once in 2  Hours for maximum of 2 tabs in 24hours   frovatriptan (FROVA) 2.5 MG tablet Take 1 tablet by mouth once a day, repeat in 2 hours if needed   levothyroxine (SYNTHROID) 137 MCG tablet Take 1 tablet by mouth once a day.   lipase/protease/amylase (CREON) 36000 UNITS CPEP capsule Take 2 capsules by mouth with meals 3 times daily.   mupirocin cream  (BACTROBAN) 2 % Apply to affected areas 2 times a day for 7 days   Pancrelipase, Lip-Prot-Amyl, (CREON PO) Take by mouth 3 (three) times daily. Pt takes 2 tablets with each meal.   primidone (MYSOLINE) 50 MG tablet Take 1 tablet by mouth at bedtime.   RESTASIS 0.05 % ophthalmic emulsion Instill 1 drop into both eyes twice a day   sertraline (ZOLOFT) 100 MG tablet Take 2 tablets by mouth once daily.   traMADol (ULTRAM) 50 MG tablet Take 1 tablet (50 mg total) by mouth every 6 (six) hours as needed for pain   traZODone (DESYREL) 100 MG tablet Take 3 tablets by mouth at bedtime daily   [DISCONTINUED] flecainide (TAMBOCOR) 150 MG tablet Take 1 tablet (150 mg total) by mouth 2 (two) times daily.     Allergies:   Nsaids and Codeine   Social History   Socioeconomic History   Marital status: Married    Spouse name: [name]   Number of children: 3   Years of education: 12+   Highest education level: Not on file  Occupational History   Occupation: unemployed   Tobacco Use   Smoking status: Never   Smokeless tobacco: Never  Vaping Use   Vaping Use: Never used  Substance and Sexual Activity   Alcohol use: No    Alcohol/week: 0.0 standard drinks of alcohol    Comment: none   Drug use: No   Sexual activity: Not on file  Other Topics Concern   Not on file  Social History Narrative   Patient lives at home alone    Patient is married    Patient is not working    Patient has 3 children    Patient has her BSN RN   Patient is right handed       Daily caffeine use 1 cup daily.   Social Determinants of Health   Financial Resource Strain: Not on file  Food Insecurity: Not on file  Transportation Needs: Not on file  Physical Activity: Not on file  Stress: Not on file  Social Connections: Not on file     Family History: The patient's family history includes Alcohol abuse  in her father and mother; Breast cancer in her sister; Cancer in an other family member; Cancer (age of onset: 35) in  her mother; Colon cancer in her sister; Gout in her father; Healthy in her daughter, son, and son; Hypertension in her daughter; Migraines in her maternal grandmother, mother, sister, sister, and sister.  ROS:   Please see the history of present illness.     All other systems reviewed and are negative.  EKGs/Labs/Other Studies Reviewed:    The following studies were reviewed today:  Echo 12/06/2015 LV EF: 60% -   65%   -------------------------------------------------------------------  History:  PMH:  Over the past year the patient has had several  episodes of tachycardia, headaches, left-sided weakness, visual  disturbances and aphasia.   -------------------------------------------------------------------  Study Conclusions   - Left ventricle: The cavity size was normal. Wall thickness was    increased in a pattern of mild LVH. Systolic function was normal.    The estimated ejection fraction was in the range of 60% to 65%.    Wall motion was normal; there were no regional wall motion    abnormalities. Features are consistent with a pseudonormal left    ventricular filling pattern, with concomitant abnormal relaxation    and increased filling pressure (grade 2 diastolic dysfunction).  - Aortic valve: There was no stenosis. There was trivial    regurgitation.  - Mitral valve: There was no significant regurgitation.  - Left atrium: The atrium was moderately dilated.  - Right ventricle: The cavity size was normal. Systolic function    was normal.  - Right atrium: The atrium was mildly dilated.  - Pulmonary arteries: PA peak pressure: 23 mm Hg (S).  - Inferior vena cava: The vessel was normal in size. The    respirophasic diameter changes were in the normal range (>= 50%),    consistent with normal central venous pressure.   Impressions:   - Normal LV size with mild LV hypertrophy. EF 60-65%. Moderate    diastolic dysfunction. Normal RV size and systolic function. No     significant valvular abnormalities.    EKG:  EKG is ordered today.  The ekg ordered today demonstrates normal sinus rhythm, no significant ST-T wave changes.  Recent Labs: 11/20/2021: BUN 18; Creatinine, Ser 0.75; Potassium 3.8; Sodium 146  Recent Lipid Panel    Component Value Date/Time   LDLDIRECT 122 (H) 02/28/2012 1110     Risk Assessment/Calculations:           Physical Exam:    VS:  BP 102/62 (BP Location: Left Arm, Patient Position: Sitting, Cuff Size: Normal)   Pulse 68   Ht 5' 6.5" (1.689 m)   Wt 183 lb (83 kg)   SpO2 96%   BMI 29.09 kg/m         Wt Readings from Last 3 Encounters:  07/30/22 183 lb (83 kg)  05/11/22 179 lb (81.2 kg)  11/20/21 189 lb 3.2 oz (85.8 kg)     GEN:  Well nourished, well developed in no acute distress HEENT: Normal NECK: No JVD; No carotid bruits LYMPHATICS: No lymphadenopathy CARDIAC: RRR, no murmurs, rubs, gallops RESPIRATORY:  Clear to auscultation without rales, wheezing or rhonchi  ABDOMEN: Soft, non-tender, non-distended MUSCULOSKELETAL:  No edema; No deformity  SKIN: Warm and dry NEUROLOGIC:  Alert and oriented x 3 PSYCHIATRIC:  Normal affect   ASSESSMENT:    1. Preop cardiovascular exam   2. Atrial tachycardia    PLAN:    In  order of problems listed above:  Preoperative clearance: Patient has upcoming right knee surgery by Dr. Berenice Primas.  Despite knee issue, she was able to accomplish more than 4 METS of activity without any issue.  She is cleared to proceed from the cardiac perspective without further work-up.  Her RCRI risk is class I, 0.9% risk of major cardiac event.  Atrial tachycardia: On flecainide.           Medication Adjustments/Labs and Tests Ordered: Current medicines are reviewed at length with the patient today.  Concerns regarding medicines are outlined above.  Orders Placed This Encounter  Procedures   EKG 12-Lead   Meds ordered this encounter  Medications   flecainide (TAMBOCOR) 150 MG  tablet    Sig: Take 1 tablet (150 mg total) by mouth 2 (two) times daily.    Dispense:  180 tablet    Refill:  3    Patient Instructions  Medication Instructions:  Your physician recommends that you continue on your current medications as directed. Please refer to the Current Medication list given to you today.  *If you need a refill on your cardiac medications before your next appointment, please call your pharmacy*  Lab Work: NONE ordered at this time of appointment   If you have labs (blood work) drawn today and your tests are completely normal, you will receive your results only by: [place] (if you have MyChart) OR A paper copy in the mail If you have any lab test that is abnormal or we need to change your treatment, we will call you to review the results.  Testing/Procedures: NONE ordered at this time of appointment   Follow-Up: At [facility], you and your health needs are our priority.  As part of our continuing mission to provide you with exceptional heart care, we have created designated Provider Care Teams.  These Care Teams include your primary Cardiologist (physician) and Advanced Practice Providers (APPs -  Physician Assistants and Nurse Practitioners) who all work together to provide you with the care you need, when you need it.  We recommend signing up for the patient portal called "MyChart".  Sign up information is provided on this After Visit Summary.  MyChart is used to connect with patients for Virtual Visits (Telemedicine).  Patients are able to view lab/test results, encounter notes, upcoming appointments, etc.  Non-urgent messages can be sent to your provider as well.   To learn more about what you can do with MyChart, go to [email or web address].    Your next appointment:   1 year(s)  The format for your next appointment:   In Person  Provider:   Kirk Ruths, MD     Other Instructions   Important Information About  [place]         Hilbert Corrigan, [place]  07/31/2022 11:27 PM    [place]

## 2022-07-31 ENCOUNTER — Other Ambulatory Visit (HOSPITAL_COMMUNITY): Payer: Self-pay

## 2022-07-31 ENCOUNTER — Encounter: Payer: Self-pay | Admitting: Physician Assistant

## 2022-08-01 ENCOUNTER — Other Ambulatory Visit (HOSPITAL_COMMUNITY): Payer: Self-pay

## 2022-08-01 MED ORDER — TRAMADOL HCL 50 MG PO TABS
50.0000 mg | ORAL_TABLET | Freq: Four times a day (QID) | ORAL | 0 refills | Status: DC | PRN
Start: 2022-08-01 — End: 2023-08-16
  Filled 2022-08-01: qty 30, 8d supply, fill #0

## 2022-08-07 ENCOUNTER — Other Ambulatory Visit (HOSPITAL_COMMUNITY): Payer: Self-pay

## 2022-08-19 ENCOUNTER — Other Ambulatory Visit (HOSPITAL_COMMUNITY): Payer: Self-pay

## 2022-08-19 MED ORDER — LEVOTHYROXINE SODIUM 137 MCG PO TABS
137.0000 ug | ORAL_TABLET | Freq: Every day | ORAL | 0 refills | Status: DC
  Filled 2022-08-19: qty 78, 91d supply, fill #0

## 2022-08-19 MED ORDER — LEVOTHYROXINE SODIUM 75 MCG PO TABS
ORAL_TABLET | ORAL | 5 refills | Status: AC
  Filled 2022-08-19: qty 4, 30d supply, fill #0

## 2022-08-20 ENCOUNTER — Other Ambulatory Visit (HOSPITAL_COMMUNITY): Payer: Self-pay

## 2022-08-21 ENCOUNTER — Other Ambulatory Visit: Payer: Self-pay | Admitting: Family Medicine

## 2022-08-21 DIAGNOSIS — Z1231 Encounter for screening mammogram for malignant neoplasm of breast: Secondary | ICD-10-CM

## 2022-08-26 ENCOUNTER — Other Ambulatory Visit (HOSPITAL_COMMUNITY): Payer: Self-pay

## 2022-08-26 DIAGNOSIS — X58XXXA Exposure to other specified factors, initial encounter: Secondary | ICD-10-CM | POA: Diagnosis not present

## 2022-08-26 DIAGNOSIS — Y999 Unspecified external cause status: Secondary | ICD-10-CM | POA: Diagnosis not present

## 2022-08-26 DIAGNOSIS — S83271A Complex tear of lateral meniscus, current injury, right knee, initial encounter: Secondary | ICD-10-CM | POA: Diagnosis not present

## 2022-08-26 DIAGNOSIS — M7121 Synovial cyst of popliteal space [Baker], right knee: Secondary | ICD-10-CM | POA: Diagnosis not present

## 2022-08-26 DIAGNOSIS — M6751 Plica syndrome, right knee: Secondary | ICD-10-CM | POA: Diagnosis not present

## 2022-08-26 DIAGNOSIS — M94261 Chondromalacia, right knee: Secondary | ICD-10-CM | POA: Diagnosis not present

## 2022-08-26 DIAGNOSIS — G8918 Other acute postprocedural pain: Secondary | ICD-10-CM | POA: Diagnosis not present

## 2022-08-26 DIAGNOSIS — S83231A Complex tear of medial meniscus, current injury, right knee, initial encounter: Secondary | ICD-10-CM | POA: Diagnosis not present

## 2022-08-26 MED ORDER — HYDROCODONE-ACETAMINOPHEN 5-325 MG PO TABS
1.0000 | ORAL_TABLET | Freq: Four times a day (QID) | ORAL | 0 refills | Status: DC | PRN
  Filled 2022-08-26: qty 20, 5d supply, fill #0

## 2022-09-02 ENCOUNTER — Other Ambulatory Visit (HOSPITAL_COMMUNITY): Payer: Self-pay

## 2022-09-02 MED ORDER — HYDROCODONE-ACETAMINOPHEN 5-325 MG PO TABS
1.0000 | ORAL_TABLET | Freq: Four times a day (QID) | ORAL | 0 refills | Status: DC | PRN
  Filled 2022-09-02: qty 20, 5d supply, fill #0

## 2022-09-03 ENCOUNTER — Other Ambulatory Visit (HOSPITAL_COMMUNITY): Payer: Self-pay

## 2022-09-05 ENCOUNTER — Other Ambulatory Visit (HOSPITAL_COMMUNITY): Payer: Self-pay

## 2022-09-05 DIAGNOSIS — R262 Difficulty in walking, not elsewhere classified: Secondary | ICD-10-CM | POA: Diagnosis not present

## 2022-09-05 DIAGNOSIS — M25661 Stiffness of right knee, not elsewhere classified: Secondary | ICD-10-CM | POA: Diagnosis not present

## 2022-09-05 MED ORDER — CELECOXIB 200 MG PO CAPS
200.0000 mg | ORAL_CAPSULE | Freq: Two times a day (BID) | ORAL | 0 refills | Status: AC
  Filled 2022-09-05: qty 60, 30d supply, fill #0

## 2022-09-06 DIAGNOSIS — R262 Difficulty in walking, not elsewhere classified: Secondary | ICD-10-CM | POA: Diagnosis not present

## 2022-09-06 DIAGNOSIS — M25661 Stiffness of right knee, not elsewhere classified: Secondary | ICD-10-CM | POA: Diagnosis not present

## 2022-09-25 ENCOUNTER — Other Ambulatory Visit (HOSPITAL_COMMUNITY): Payer: Self-pay

## 2022-09-25 ENCOUNTER — Encounter: Payer: Self-pay | Admitting: Oncology

## 2022-09-26 ENCOUNTER — Encounter: Payer: Self-pay | Admitting: Oncology

## 2022-09-26 ENCOUNTER — Other Ambulatory Visit (HOSPITAL_COMMUNITY): Payer: Self-pay

## 2022-09-30 ENCOUNTER — Other Ambulatory Visit (HOSPITAL_COMMUNITY): Payer: Self-pay

## 2022-09-30 MED ORDER — CYCLOSPORINE 0.05 % OP EMUL
1.0000 [drp] | Freq: Two times a day (BID) | OPHTHALMIC | 4 refills | Status: AC
  Filled 2022-09-30: qty 180, 90d supply, fill #0

## 2022-10-02 ENCOUNTER — Other Ambulatory Visit (HOSPITAL_COMMUNITY): Payer: Self-pay

## 2022-10-14 ENCOUNTER — Other Ambulatory Visit (HOSPITAL_COMMUNITY): Payer: Self-pay

## 2022-10-14 MED ORDER — ROSUVASTATIN CALCIUM 5 MG PO TABS
5.0000 mg | ORAL_TABLET | Freq: Every day | ORAL | 0 refills | Status: DC
  Filled 2022-10-14: qty 90, 90d supply, fill #0

## 2022-10-15 ENCOUNTER — Other Ambulatory Visit (HOSPITAL_COMMUNITY): Payer: Self-pay

## 2022-10-15 ENCOUNTER — Other Ambulatory Visit: Payer: Self-pay

## 2022-10-16 DIAGNOSIS — E785 Hyperlipidemia, unspecified: Secondary | ICD-10-CM | POA: Diagnosis not present

## 2022-10-16 DIAGNOSIS — F324 Major depressive disorder, single episode, in partial remission: Secondary | ICD-10-CM | POA: Diagnosis not present

## 2022-10-16 DIAGNOSIS — Z008 Encounter for other general examination: Secondary | ICD-10-CM | POA: Diagnosis not present

## 2022-10-16 DIAGNOSIS — I471 Supraventricular tachycardia, unspecified: Secondary | ICD-10-CM | POA: Diagnosis not present

## 2022-10-16 DIAGNOSIS — M545 Low back pain, unspecified: Secondary | ICD-10-CM | POA: Diagnosis not present

## 2022-10-16 DIAGNOSIS — E890 Postprocedural hypothyroidism: Secondary | ICD-10-CM | POA: Diagnosis not present

## 2022-10-16 DIAGNOSIS — M8580 Other specified disorders of bone density and structure, unspecified site: Secondary | ICD-10-CM | POA: Diagnosis not present

## 2022-10-16 DIAGNOSIS — R69 Illness, unspecified: Secondary | ICD-10-CM | POA: Diagnosis not present

## 2022-10-16 DIAGNOSIS — M1990 Unspecified osteoarthritis, unspecified site: Secondary | ICD-10-CM | POA: Diagnosis not present

## 2022-10-16 DIAGNOSIS — K3184 Gastroparesis: Secondary | ICD-10-CM | POA: Diagnosis not present

## 2022-10-16 DIAGNOSIS — K8681 Exocrine pancreatic insufficiency: Secondary | ICD-10-CM | POA: Diagnosis not present

## 2022-10-16 DIAGNOSIS — E669 Obesity, unspecified: Secondary | ICD-10-CM | POA: Diagnosis not present

## 2022-10-16 DIAGNOSIS — H04129 Dry eye syndrome of unspecified lacrimal gland: Secondary | ICD-10-CM | POA: Diagnosis not present

## 2022-10-17 ENCOUNTER — Ambulatory Visit
Admission: RE | Admit: 2022-10-17 | Discharge: 2022-10-17 | Disposition: A | Discharge status: Home / Self Care | Payer: Medicare HMO | Source: Ambulatory Visit | Attending: Family Medicine | Admitting: Family Medicine

## 2022-10-17 DIAGNOSIS — Z1231 Encounter for screening mammogram for malignant neoplasm of breast: Secondary | ICD-10-CM | POA: Diagnosis not present

## 2022-10-18 ENCOUNTER — Other Ambulatory Visit (HOSPITAL_COMMUNITY): Payer: Self-pay

## 2022-10-21 ENCOUNTER — Other Ambulatory Visit (HOSPITAL_COMMUNITY): Payer: Self-pay

## 2022-10-21 MED ORDER — CYCLOBENZAPRINE HCL 10 MG PO TABS
10.0000 mg | ORAL_TABLET | Freq: Every day | ORAL | 1 refills | Status: DC
  Filled 2022-10-21: qty 90, 90d supply, fill #0
  Filled 2023-01-17: qty 90, 90d supply, fill #1

## 2022-10-23 ENCOUNTER — Other Ambulatory Visit (HOSPITAL_COMMUNITY): Payer: Self-pay

## 2022-10-23 MED ORDER — ELETRIPTAN HYDROBROMIDE 40 MG PO TABS
ORAL_TABLET | ORAL | 0 refills | Status: DC
  Filled 2022-10-23: qty 12, 30d supply, fill #0

## 2022-10-30 ENCOUNTER — Other Ambulatory Visit (HOSPITAL_COMMUNITY): Payer: Self-pay

## 2022-10-30 DIAGNOSIS — R197 Diarrhea, unspecified: Secondary | ICD-10-CM | POA: Diagnosis not present

## 2022-10-30 DIAGNOSIS — R1084 Generalized abdominal pain: Secondary | ICD-10-CM | POA: Diagnosis not present

## 2022-10-30 MED ORDER — CHOLESTYRAMINE 4 GM/DOSE PO POWD
ORAL | 0 refills | Status: AC
  Filled 2022-10-30: qty 368.76, 21d supply, fill #0

## 2022-10-30 MED ORDER — PANCREAZE 37000-97300 UNITS PO CPEP
3.0000 | ORAL_CAPSULE | Freq: Three times a day (TID) | ORAL | 3 refills | Status: DC
  Filled 2022-10-30: qty 270, 30d supply, fill #0
  Filled 2023-01-27: qty 540, 60d supply, fill #0

## 2022-10-30 MED ORDER — DICYCLOMINE HCL 20 MG PO TABS
20.0000 mg | ORAL_TABLET | Freq: Three times a day (TID) | ORAL | 0 refills | Status: AC | PRN
  Filled 2022-10-30: qty 60, 20d supply, fill #0

## 2022-10-31 ENCOUNTER — Other Ambulatory Visit (HOSPITAL_COMMUNITY): Payer: Self-pay

## 2022-11-01 ENCOUNTER — Other Ambulatory Visit (HOSPITAL_COMMUNITY): Payer: Self-pay

## 2022-11-04 ENCOUNTER — Other Ambulatory Visit (HOSPITAL_COMMUNITY): Payer: Self-pay

## 2022-11-05 ENCOUNTER — Other Ambulatory Visit (HOSPITAL_COMMUNITY): Payer: Self-pay

## 2022-11-05 MED ORDER — TRAZODONE HCL 100 MG PO TABS
ORAL_TABLET | ORAL | 0 refills | Status: DC
  Filled 2022-11-05 – 2023-01-14 (×2): qty 270, 90d supply, fill #0

## 2022-11-06 ENCOUNTER — Other Ambulatory Visit (HOSPITAL_COMMUNITY): Payer: Self-pay

## 2022-11-11 ENCOUNTER — Other Ambulatory Visit (HOSPITAL_COMMUNITY): Payer: Self-pay

## 2022-11-12 ENCOUNTER — Other Ambulatory Visit (HOSPITAL_COMMUNITY): Payer: Self-pay

## 2022-11-14 DIAGNOSIS — Z1389 Encounter for screening for other disorder: Secondary | ICD-10-CM | POA: Diagnosis not present

## 2022-11-14 DIAGNOSIS — Z9181 History of falling: Secondary | ICD-10-CM | POA: Diagnosis not present

## 2022-11-14 DIAGNOSIS — Z6829 Body mass index (BMI) 29.0-29.9, adult: Secondary | ICD-10-CM | POA: Diagnosis not present

## 2022-11-14 DIAGNOSIS — Z0000 Encounter for general adult medical examination without abnormal findings: Secondary | ICD-10-CM | POA: Diagnosis not present

## 2022-11-22 ENCOUNTER — Other Ambulatory Visit (HOSPITAL_COMMUNITY): Payer: Self-pay

## 2022-11-22 DIAGNOSIS — R69 Illness, unspecified: Secondary | ICD-10-CM | POA: Diagnosis not present

## 2022-11-22 DIAGNOSIS — Z6829 Body mass index (BMI) 29.0-29.9, adult: Secondary | ICD-10-CM | POA: Diagnosis not present

## 2022-11-22 DIAGNOSIS — I700 Atherosclerosis of aorta: Secondary | ICD-10-CM | POA: Diagnosis not present

## 2022-11-22 DIAGNOSIS — E663 Overweight: Secondary | ICD-10-CM | POA: Diagnosis not present

## 2022-11-22 DIAGNOSIS — E890 Postprocedural hypothyroidism: Secondary | ICD-10-CM | POA: Diagnosis not present

## 2022-11-22 DIAGNOSIS — Z79899 Other long term (current) drug therapy: Secondary | ICD-10-CM | POA: Diagnosis not present

## 2022-11-22 MED ORDER — TOPIRAMATE 100 MG PO TABS
100.0000 mg | ORAL_TABLET | Freq: Every day | ORAL | 0 refills | Status: DC
  Filled 2022-11-22: qty 90, 90d supply, fill #0

## 2022-11-25 ENCOUNTER — Other Ambulatory Visit (HOSPITAL_COMMUNITY): Payer: Self-pay

## 2022-11-25 DIAGNOSIS — L298 Other pruritus: Secondary | ICD-10-CM | POA: Diagnosis not present

## 2022-11-25 DIAGNOSIS — L538 Other specified erythematous conditions: Secondary | ICD-10-CM | POA: Diagnosis not present

## 2022-11-25 DIAGNOSIS — Z789 Other specified health status: Secondary | ICD-10-CM | POA: Diagnosis not present

## 2022-11-25 DIAGNOSIS — L720 Epidermal cyst: Secondary | ICD-10-CM | POA: Diagnosis not present

## 2022-11-25 DIAGNOSIS — L821 Other seborrheic keratosis: Secondary | ICD-10-CM | POA: Diagnosis not present

## 2022-11-25 DIAGNOSIS — L814 Other melanin hyperpigmentation: Secondary | ICD-10-CM | POA: Diagnosis not present

## 2022-11-25 DIAGNOSIS — D1801 Hemangioma of skin and subcutaneous tissue: Secondary | ICD-10-CM | POA: Diagnosis not present

## 2022-11-25 DIAGNOSIS — L820 Inflamed seborrheic keratosis: Secondary | ICD-10-CM | POA: Diagnosis not present

## 2022-11-25 MED ORDER — TRETINOIN 0.05 % EX CREA
TOPICAL_CREAM | CUTANEOUS | 4 refills | Status: AC
  Filled 2022-11-25: qty 20, 10d supply, fill #0

## 2022-11-26 ENCOUNTER — Other Ambulatory Visit (HOSPITAL_COMMUNITY): Payer: Self-pay

## 2022-11-26 MED ORDER — LEVOTHYROXINE SODIUM 137 MCG PO TABS: 137.0000 ug | ORAL_TABLET | ORAL | 4 refills | Status: AC

## 2022-11-26 MED ORDER — LEVOTHYROXINE SODIUM 137 MCG PO TABS
137.0000 ug | ORAL_TABLET | ORAL | 3 refills | Status: AC
  Filled 2022-11-26: qty 78, 90d supply, fill #0
  Filled 2023-07-29: qty 78, 90d supply, fill #1

## 2022-12-02 ENCOUNTER — Other Ambulatory Visit (HOSPITAL_COMMUNITY): Payer: Self-pay

## 2022-12-04 ENCOUNTER — Other Ambulatory Visit (HOSPITAL_COMMUNITY): Payer: Self-pay

## 2022-12-06 ENCOUNTER — Other Ambulatory Visit (HOSPITAL_COMMUNITY): Payer: Self-pay

## 2022-12-06 MED ORDER — HYOSCYAMINE SULFATE SL 0.125 MG SL SUBL
SUBLINGUAL_TABLET | SUBLINGUAL | 3 refills | Status: DC
  Filled 2022-12-06: qty 90, 30d supply, fill #0

## 2022-12-06 MED ORDER — COLESTIPOL HCL 1 G PO TABS
1.0000 g | ORAL_TABLET | Freq: Two times a day (BID) | ORAL | 3 refills | Status: AC
  Filled 2022-12-06: qty 60, 30d supply, fill #0

## 2022-12-06 MED ORDER — SERTRALINE HCL 100 MG PO TABS
200.0000 mg | ORAL_TABLET | Freq: Every day | ORAL | 2 refills | Status: DC
  Filled 2022-12-06: qty 180, 90d supply, fill #0
  Filled 2023-03-17: qty 180, 90d supply, fill #1
  Filled 2023-06-09: qty 180, 90d supply, fill #2

## 2022-12-06 MED ORDER — CYCLOSPORINE 0.05 % OP EMUL
1.0000 [drp] | Freq: Two times a day (BID) | OPHTHALMIC | 4 refills | Status: AC
  Filled 2022-12-06 (×2): qty 60, 30d supply, fill #0

## 2022-12-07 ENCOUNTER — Other Ambulatory Visit (HOSPITAL_COMMUNITY): Payer: Self-pay

## 2022-12-09 ENCOUNTER — Other Ambulatory Visit (HOSPITAL_COMMUNITY): Payer: Self-pay

## 2023-01-07 ENCOUNTER — Other Ambulatory Visit (HOSPITAL_COMMUNITY): Payer: Self-pay

## 2023-01-07 MED ORDER — ROSUVASTATIN CALCIUM 5 MG PO TABS
5.0000 mg | ORAL_TABLET | Freq: Every day | ORAL | 1 refills | Status: DC
  Filled 2023-01-07: qty 90, 90d supply, fill #0
  Filled 2023-04-09: qty 90, 90d supply, fill #1

## 2023-01-14 ENCOUNTER — Other Ambulatory Visit (HOSPITAL_COMMUNITY): Payer: Self-pay

## 2023-01-14 ENCOUNTER — Other Ambulatory Visit: Payer: Self-pay

## 2023-01-17 ENCOUNTER — Other Ambulatory Visit (HOSPITAL_COMMUNITY): Payer: Self-pay

## 2023-01-18 ENCOUNTER — Other Ambulatory Visit (HOSPITAL_COMMUNITY): Payer: Self-pay

## 2023-01-22 ENCOUNTER — Other Ambulatory Visit (HOSPITAL_COMMUNITY): Payer: Self-pay

## 2023-01-22 MED ORDER — PANCREAZE 37000-97300 UNITS PO CPEP
3.0000 | ORAL_CAPSULE | Freq: Three times a day (TID) | ORAL | 3 refills | Status: DC
  Filled 2023-01-22: qty 810, 90d supply, fill #0

## 2023-01-27 ENCOUNTER — Other Ambulatory Visit (HOSPITAL_COMMUNITY): Payer: Self-pay

## 2023-01-28 ENCOUNTER — Other Ambulatory Visit: Payer: Self-pay

## 2023-01-28 ENCOUNTER — Other Ambulatory Visit (HOSPITAL_COMMUNITY): Payer: Self-pay

## 2023-01-28 MED ORDER — CREON 36000-114000 UNITS PO CPEP
72000.0000 [IU] | ORAL_CAPSULE | Freq: Three times a day (TID) | ORAL | 6 refills | Status: AC
  Filled 2023-01-28: qty 180, 30d supply, fill #0
  Filled 2023-05-12: qty 180, 30d supply, fill #1
  Filled 2023-08-13: qty 200, 34d supply, fill #2
  Filled 2023-09-25: qty 180, 30d supply, fill #2
  Filled 2023-10-01: qty 200, 34d supply, fill #2

## 2023-02-14 DIAGNOSIS — E890 Postprocedural hypothyroidism: Secondary | ICD-10-CM | POA: Diagnosis not present

## 2023-02-14 DIAGNOSIS — C73 Malignant neoplasm of thyroid gland: Secondary | ICD-10-CM | POA: Diagnosis not present

## 2023-02-14 DIAGNOSIS — E162 Hypoglycemia, unspecified: Secondary | ICD-10-CM | POA: Diagnosis not present

## 2023-02-21 ENCOUNTER — Other Ambulatory Visit (HOSPITAL_COMMUNITY): Payer: Self-pay

## 2023-02-21 DIAGNOSIS — E162 Hypoglycemia, unspecified: Secondary | ICD-10-CM | POA: Diagnosis not present

## 2023-02-21 DIAGNOSIS — C73 Malignant neoplasm of thyroid gland: Secondary | ICD-10-CM | POA: Diagnosis not present

## 2023-02-21 DIAGNOSIS — M899 Disorder of bone, unspecified: Secondary | ICD-10-CM | POA: Diagnosis not present

## 2023-02-21 DIAGNOSIS — E890 Postprocedural hypothyroidism: Secondary | ICD-10-CM | POA: Diagnosis not present

## 2023-02-21 MED ORDER — LEVOTHYROXINE SODIUM 137 MCG PO TABS
137.0000 ug | ORAL_TABLET | Freq: Every morning | ORAL | 11 refills | Status: AC
  Filled 2023-02-21: qty 30, 30d supply, fill #0
  Filled 2023-04-14: qty 30, 30d supply, fill #1
  Filled 2023-05-12: qty 30, 30d supply, fill #2
  Filled 2023-06-09 (×2): qty 30, 30d supply, fill #3
  Filled 2023-07-29: qty 90, 90d supply, fill #4
  Filled 2023-12-29: qty 90, 90d supply, fill #5

## 2023-02-25 ENCOUNTER — Other Ambulatory Visit (HOSPITAL_COMMUNITY): Payer: Self-pay

## 2023-03-06 ENCOUNTER — Other Ambulatory Visit (HOSPITAL_COMMUNITY): Payer: Self-pay

## 2023-03-06 DIAGNOSIS — H5203 Hypermetropia, bilateral: Secondary | ICD-10-CM | POA: Diagnosis not present

## 2023-03-06 MED ORDER — CYCLOSPORINE 0.05 % OP EMUL
1.0000 [drp] | Freq: Two times a day (BID) | OPHTHALMIC | 3 refills | Status: AC
  Filled 2023-03-06 – 2024-02-23 (×4): qty 180, 90d supply, fill #0

## 2023-03-11 ENCOUNTER — Other Ambulatory Visit (HOSPITAL_COMMUNITY): Payer: Self-pay

## 2023-03-17 ENCOUNTER — Other Ambulatory Visit (HOSPITAL_COMMUNITY): Payer: Self-pay

## 2023-03-19 ENCOUNTER — Other Ambulatory Visit (HOSPITAL_COMMUNITY): Payer: Self-pay

## 2023-03-31 ENCOUNTER — Other Ambulatory Visit (HOSPITAL_COMMUNITY): Payer: Self-pay

## 2023-03-31 DIAGNOSIS — G894 Chronic pain syndrome: Secondary | ICD-10-CM | POA: Diagnosis not present

## 2023-03-31 DIAGNOSIS — G43009 Migraine without aura, not intractable, without status migrainosus: Secondary | ICD-10-CM | POA: Diagnosis not present

## 2023-03-31 DIAGNOSIS — Z0000 Encounter for general adult medical examination without abnormal findings: Secondary | ICD-10-CM | POA: Diagnosis not present

## 2023-03-31 DIAGNOSIS — Z6830 Body mass index (BMI) 30.0-30.9, adult: Secondary | ICD-10-CM | POA: Diagnosis not present

## 2023-03-31 DIAGNOSIS — E6609 Other obesity due to excess calories: Secondary | ICD-10-CM | POA: Diagnosis not present

## 2023-03-31 MED ORDER — CYCLOBENZAPRINE HCL 10 MG PO TABS
5.0000 mg | ORAL_TABLET | Freq: Every evening | ORAL | 0 refills | Status: DC | PRN
  Filled 2023-03-31: qty 90, 90d supply, fill #0

## 2023-04-03 DIAGNOSIS — E890 Postprocedural hypothyroidism: Secondary | ICD-10-CM | POA: Diagnosis not present

## 2023-04-03 DIAGNOSIS — C73 Malignant neoplasm of thyroid gland: Secondary | ICD-10-CM | POA: Diagnosis not present

## 2023-04-03 DIAGNOSIS — E162 Hypoglycemia, unspecified: Secondary | ICD-10-CM | POA: Diagnosis not present

## 2023-04-09 ENCOUNTER — Other Ambulatory Visit (HOSPITAL_COMMUNITY): Payer: Self-pay

## 2023-04-14 ENCOUNTER — Other Ambulatory Visit (HOSPITAL_COMMUNITY): Payer: Self-pay

## 2023-04-14 MED ORDER — TOPIRAMATE 100 MG PO TABS
100.0000 mg | ORAL_TABLET | Freq: Every day | ORAL | 0 refills | Status: DC
  Filled 2023-04-14: qty 90, 90d supply, fill #0

## 2023-04-16 ENCOUNTER — Other Ambulatory Visit (HOSPITAL_COMMUNITY): Payer: Self-pay

## 2023-04-16 MED ORDER — TRAZODONE HCL 100 MG PO TABS
300.0000 mg | ORAL_TABLET | Freq: Every day | ORAL | 0 refills | Status: DC
  Filled 2023-04-16: qty 270, 90d supply, fill #0

## 2023-04-17 ENCOUNTER — Other Ambulatory Visit (HOSPITAL_COMMUNITY): Payer: Self-pay

## 2023-04-17 MED ORDER — ONDANSETRON 4 MG PO TBDP
4.0000 mg | ORAL_TABLET | Freq: Three times a day (TID) | ORAL | 0 refills | Status: AC
  Filled 2023-04-17: qty 30, 10d supply, fill #0

## 2023-05-06 ENCOUNTER — Other Ambulatory Visit (HOSPITAL_COMMUNITY): Payer: Self-pay

## 2023-05-06 DIAGNOSIS — Z6830 Body mass index (BMI) 30.0-30.9, adult: Secondary | ICD-10-CM | POA: Diagnosis not present

## 2023-05-06 DIAGNOSIS — E6609 Other obesity due to excess calories: Secondary | ICD-10-CM | POA: Diagnosis not present

## 2023-05-07 ENCOUNTER — Other Ambulatory Visit (HOSPITAL_COMMUNITY): Payer: Self-pay

## 2023-05-12 ENCOUNTER — Other Ambulatory Visit (HOSPITAL_COMMUNITY): Payer: Self-pay

## 2023-05-28 ENCOUNTER — Other Ambulatory Visit: Payer: Self-pay | Admitting: Gastroenterology

## 2023-05-28 ENCOUNTER — Other Ambulatory Visit (HOSPITAL_COMMUNITY): Payer: Self-pay

## 2023-05-28 DIAGNOSIS — R1012 Left upper quadrant pain: Secondary | ICD-10-CM | POA: Diagnosis not present

## 2023-05-28 MED ORDER — OMEPRAZOLE 20 MG PO CPDR
20.0000 mg | DELAYED_RELEASE_CAPSULE | Freq: Every day | ORAL | 3 refills | Status: AC
  Filled 2023-06-05 – 2023-06-09 (×2): qty 30, 30d supply, fill #0
  Filled 2023-07-29: qty 90, 90d supply, fill #1

## 2023-05-29 DIAGNOSIS — R051 Acute cough: Secondary | ICD-10-CM | POA: Diagnosis not present

## 2023-05-29 DIAGNOSIS — Z20822 Contact with and (suspected) exposure to covid-19: Secondary | ICD-10-CM | POA: Diagnosis not present

## 2023-05-29 DIAGNOSIS — Z6829 Body mass index (BMI) 29.0-29.9, adult: Secondary | ICD-10-CM | POA: Diagnosis not present

## 2023-05-30 ENCOUNTER — Encounter: Payer: Self-pay | Admitting: Gastroenterology

## 2023-06-02 ENCOUNTER — Encounter: Payer: Self-pay | Admitting: Gastroenterology

## 2023-06-05 ENCOUNTER — Other Ambulatory Visit (HOSPITAL_COMMUNITY): Payer: Self-pay

## 2023-06-09 ENCOUNTER — Other Ambulatory Visit (HOSPITAL_COMMUNITY): Payer: Self-pay

## 2023-06-10 ENCOUNTER — Other Ambulatory Visit (HOSPITAL_COMMUNITY): Payer: Self-pay

## 2023-06-13 ENCOUNTER — Ambulatory Visit
Admission: RE | Admit: 2023-06-13 | Discharge: 2023-06-13 | Disposition: A | Discharge status: Home / Self Care | Payer: Medicare HMO | Source: Ambulatory Visit | Attending: Gastroenterology | Admitting: Gastroenterology

## 2023-06-13 DIAGNOSIS — R1012 Left upper quadrant pain: Secondary | ICD-10-CM | POA: Diagnosis not present

## 2023-06-13 MED ORDER — IOPAMIDOL (ISOVUE-300) INJECTION 61%
500.0000 mL | Freq: Once | INTRAVENOUS | Status: AC | PRN
  Administered 2023-06-13: 100 mL via INTRAVENOUS

## 2023-07-09 ENCOUNTER — Other Ambulatory Visit (HOSPITAL_COMMUNITY): Payer: Self-pay

## 2023-07-09 ENCOUNTER — Encounter: Payer: Self-pay | Admitting: Oncology

## 2023-07-09 MED ORDER — LEVOTHYROXINE SODIUM 137 MCG PO TABS
137.0000 ug | ORAL_TABLET | Freq: Every morning | ORAL | 11 refills | Status: DC
  Filled 2023-07-09: qty 30, 30d supply, fill #0

## 2023-07-10 ENCOUNTER — Other Ambulatory Visit: Payer: Self-pay

## 2023-07-11 ENCOUNTER — Other Ambulatory Visit (HOSPITAL_COMMUNITY): Payer: Self-pay

## 2023-07-11 ENCOUNTER — Other Ambulatory Visit: Payer: Self-pay

## 2023-07-11 MED ORDER — ROSUVASTATIN CALCIUM 5 MG PO TABS
5.0000 mg | ORAL_TABLET | Freq: Every day | ORAL | 1 refills | Status: DC
  Filled 2023-07-11: qty 90, 90d supply, fill #0
  Filled 2023-09-25: qty 90, 90d supply, fill #1

## 2023-07-17 ENCOUNTER — Other Ambulatory Visit: Payer: Self-pay | Admitting: Family Medicine

## 2023-07-17 DIAGNOSIS — Z1231 Encounter for screening mammogram for malignant neoplasm of breast: Secondary | ICD-10-CM

## 2023-07-22 ENCOUNTER — Other Ambulatory Visit (HOSPITAL_COMMUNITY): Payer: Self-pay

## 2023-07-22 DIAGNOSIS — J018 Other acute sinusitis: Secondary | ICD-10-CM | POA: Diagnosis not present

## 2023-07-22 MED ORDER — ELETRIPTAN HYDROBROMIDE 40 MG PO TABS
ORAL_TABLET | ORAL | 0 refills | Status: DC
  Filled 2023-07-22: qty 12, 30d supply, fill #0

## 2023-07-23 ENCOUNTER — Other Ambulatory Visit: Payer: Self-pay

## 2023-07-27 ENCOUNTER — Emergency Department (HOSPITAL_COMMUNITY): Payer: Medicare HMO

## 2023-07-27 ENCOUNTER — Emergency Department (HOSPITAL_COMMUNITY)
Admission: EM | Admit: 2023-07-27 | Discharge: 2023-07-27 | Disposition: A | Discharge status: Home / Self Care | Payer: Medicare HMO | Attending: Emergency Medicine | Admitting: Emergency Medicine

## 2023-07-27 DIAGNOSIS — M19011 Primary osteoarthritis, right shoulder: Secondary | ICD-10-CM | POA: Diagnosis not present

## 2023-07-27 DIAGNOSIS — W19XXXA Unspecified fall, initial encounter: Secondary | ICD-10-CM | POA: Diagnosis not present

## 2023-07-27 DIAGNOSIS — W010XXA Fall on same level from slipping, tripping and stumbling without subsequent striking against object, initial encounter: Secondary | ICD-10-CM | POA: Insufficient documentation

## 2023-07-27 DIAGNOSIS — S42024B Nondisplaced fracture of shaft of right clavicle, initial encounter for open fracture: Secondary | ICD-10-CM | POA: Insufficient documentation

## 2023-07-27 DIAGNOSIS — M25511 Pain in right shoulder: Secondary | ICD-10-CM | POA: Diagnosis not present

## 2023-07-27 DIAGNOSIS — S42024A Nondisplaced fracture of shaft of right clavicle, initial encounter for closed fracture: Secondary | ICD-10-CM | POA: Diagnosis not present

## 2023-07-27 DIAGNOSIS — S42001B Fracture of unspecified part of right clavicle, initial encounter for open fracture: Secondary | ICD-10-CM

## 2023-07-27 DIAGNOSIS — M25519 Pain in unspecified shoulder: Secondary | ICD-10-CM | POA: Diagnosis not present

## 2023-07-27 DIAGNOSIS — S4991XA Unspecified injury of right shoulder and upper arm, initial encounter: Secondary | ICD-10-CM | POA: Diagnosis present

## 2023-07-27 MED ORDER — HYDROMORPHONE HCL 1 MG/ML IJ SOLN
0.5000 mg | Freq: Once | INTRAMUSCULAR | Status: AC
  Administered 2023-07-27: 0.5 mg via INTRAVENOUS

## 2023-07-27 MED ORDER — ONDANSETRON HCL 4 MG/2ML IJ SOLN
4.0000 mg | Freq: Once | INTRAMUSCULAR | Status: AC
  Administered 2023-07-27: 4 mg via INTRAVENOUS
  Filled 2023-07-27: qty 2

## 2023-07-27 MED ORDER — HYDROMORPHONE HCL 1 MG/ML IJ SOLN
0.5000 mg | Freq: Once | INTRAMUSCULAR | Status: AC
  Administered 2023-07-27: 0.5 mg via INTRAVENOUS
  Filled 2023-07-27: qty 1

## 2023-07-27 MED ORDER — OXYCODONE HCL 5 MG PO TABS
5.0000 mg | ORAL_TABLET | ORAL | 0 refills | Status: DC | PRN
  Filled 2023-07-27: qty 6, 1d supply, fill #0

## 2023-07-27 MED ORDER — OXYCODONE HCL 5 MG PO TABS
5.0000 mg | ORAL_TABLET | Freq: Once | ORAL | Status: AC
  Administered 2023-07-27: 5 mg via ORAL
  Filled 2023-07-27: qty 1

## 2023-07-27 NOTE — Progress Notes (Signed)
Orthopedic Tech Progress Note Patient Details:  Rebecca Marshall 17-Nov-1950 [id number]  Ortho Devices Type of Ortho Device: Arm sling Ortho Device/Splint Location: rue Ortho Device/Splint Interventions: Ordered, Adjustment, Application   Post Interventions Patient Tolerated: Well Instructions Provided: Care of device  Trinna Post 07/27/2023, 7:58 PM

## 2023-07-27 NOTE — ED Provider Notes (Signed)
[facility] Provider Note   CSN: [id number] Arrival date & time: 07/27/23  1741     History  Chief Complaint  Patient presents with   Fall    Right shoulder impact, no LOC/ head trauma    Rebecca Marshall is a 72 y.o. female.  This is a 72 year old female presents emergency department today with right collarbone pain.  Patient was at home, and tripped on a board and landed on her right shoulder.  She did not strike her head, lose consciousness, she is not on blood thinners.  She does not have any neck pain.   Fall       Home Medications Prior to Admission medications   Medication Sig Start Date End Date Taking? Authorizing Provider  oxyCODONE (ROXICODONE) 5 MG immediate release tablet Take 1 tablet (5 mg total) by mouth every 4 (four) hours as needed for severe pain (pain score 7-10). 07/27/23  Yes [name], DO  acetaminophen (TYLENOL) 500 MG tablet Take 1,000 mg by mouth every 8 (eight) hours as needed for mild pain or moderate pain.     [provider]  albuterol (VENTOLIN HFA) 108 (90 Base) MCG/ACT inhaler Inhale 2 puffs as needed every 4 hrs. 03/05/22     amoxicillin-clavulanate (AUGMENTIN) 875-125 MG tablet Take 1 tablet by mouth every 12 hours for 10 days 01/01/22     celecoxib (CELEBREX) 200 MG capsule Take 1 capsule (200 mg total) by mouth 2 (two) times daily. 09/05/22     cholestyramine (QUESTRAN) 4 GM/DOSE powder Use 1 scoop by mouth 1 - 2 times a day 10/30/22     colestipol (COLESTID) 1 g tablet Take 1 tablet (1 g total) by mouth 2 times daily. 07/26/21     colestipol (COLESTID) 1 g tablet Take 1 tablet (1 g total) by mouth 2 (two) times daily. 12/06/22     CREON 36000-114000 units CPEP capsule Take 2 capsules by mouth 3 times daily with meals. 07/26/21     cyclobenzaprine (FLEXERIL) 10 MG tablet Take 1 tablet (10 mg total) by mouth at bedtime. 10/21/22     cyclobenzaprine (FLEXERIL) 10 MG tablet Take 1/2 - 1  tablet (5 - 10 mg total) by mouth at bedtime as needed for muscle spasm, neck pain 03/31/23     cycloSPORINE (RESTASIS) 0.05 % ophthalmic emulsion Place 1 drop in each eye twice a day as directed. 11/30/20     cycloSPORINE (RESTASIS) 0.05 % ophthalmic emulsion Place 1 drop into both eyes 2 (two) times daily as directed. 09/30/22     cycloSPORINE (RESTASIS) 0.05 % ophthalmic emulsion Place 1 drop into both eyes 2 (two) times daily. 12/06/22     cycloSPORINE (RESTASIS) 0.05 % ophthalmic emulsion Place 1 drop into both eyes 2 (two) times daily. 03/06/23     diazepam (VALIUM) 10 MG tablet Take 10 mg by mouth daily as needed. 02/15/20   [provider]  diazepam (VALIUM) 10 MG tablet Take 1 tablet by mouth once daily as needed for headache 12/17/21     dicyclomine (BENTYL) 20 MG tablet Take 1 tablet (20 mg total) by mouth 3 (three) times daily as needed. 10/30/22     eletriptan (RELPAX) 40 MG tablet Take by mouth one at the beginning of headache and may repeat once in 2  Hours for maximum of 2 tabs in 24hours 04/05/15   [name], MD  eletriptan (RELPAX) 40 MG tablet Take 1 tablet by  mouth at onset, may repeat in 2 hours if needed. 07/22/23     flecainide (TAMBOCOR) 150 MG tablet Take 1 tablet (150 mg total) by mouth 2 (two) times daily. 07/30/22   Azalee Course, PA  frovatriptan (FROVA) 2.5 MG tablet Take 1 tablet by mouth once a day, repeat in 2 hours if needed 03/15/22     HYDROcodone-acetaminophen (NORCO/VICODIN) 5-325 MG tablet Take 1 tablet by mouth every six hours as needed for pain 09/02/22     Hyoscyamine Sulfate SL 0.125 MG SUBL Place 1 tablet under the tongue and allow to dissolve 3 times a day as needed 12/06/22     levothyroxine (SYNTHROID) 137 MCG tablet Take 1 tablet by mouth once a day. 02/13/22     levothyroxine (SYNTHROID) 137 MCG tablet Take 1 tablet (137 mcg total) by mouth daily 6 days a week 11/26/22     levothyroxine (SYNTHROID) 137 MCG tablet Take 1 tablet (137 mcg total) by mouth 6 times a  week 11/26/22     levothyroxine (SYNTHROID) 137 MCG tablet Take 1 tablet (137 mcg total) by mouth every morning on an empty stomach 02/21/23     levothyroxine (SYNTHROID) 137 MCG tablet Take 1 tablet (137 mcg total) by mouth in the morning on an empty stomach. 07/09/23     levothyroxine (SYNTHROID) 150 MCG tablet TAKE 1 TABLET BY MOUTH EVERY MORNING ON AN EMPTY STOMACH 12/06/20 12/06/21  [name], MD  levothyroxine (SYNTHROID) 75 MCG tablet Take 1 tablet by mouth in the morning on an empty stomach. Take on Sunday. 08/19/22     lipase/protease/amylase (CREON) 36000 UNITS CPEP capsule Take 2 capsules by mouth with meals 3 times daily. 01/15/22     lipase/protease/amylase (CREON) 36000 UNITS CPEP capsule Take 2 capsules (72,000 Units total) by mouth 3 (three) times daily with each meal 01/27/23     mupirocin cream (BACTROBAN) 2 % Apply to affected areas 2 times a day for 7 days 01/01/22     omeprazole (PRILOSEC) 20 MG capsule Take 1 capsule (20 mg total) by mouth daily 30 minutes before morning meal 05/28/23     ondansetron (ZOFRAN-ODT) 4 MG disintegrating tablet Dissolve 1 tablet (4 mg total) in the mouth every 8 (eight) hours. 04/17/23     Pancrelipase, Lip-Prot-Amyl, (CREON PO) Take by mouth 3 (three) times daily. Pt takes 2 tablets with each meal.    [provider]  Pancrelipase, Lip-Prot-Amyl, (PANCREAZE) 37000-97300 units CPEP Take 3 capsules (111,000 Units total) by mouth 3 (three) times daily with each meal 10/30/22     Pancrelipase, Lip-Prot-Amyl, (PANCREAZE) 37000-97300 units CPEP Take 3 capsules (111,000 Units total) by mouth 3 (three) times daily with meals. 01/22/23     primidone (MYSOLINE) 50 MG tablet Take 1 tablet by mouth at bedtime. 12/17/21   Anson Fret, MD  RESTASIS 0.05 % ophthalmic emulsion Instill 1 drop into both eyes twice a day 01/14/22     rosuvastatin (CRESTOR) 5 MG tablet Take 1 tablet (5 mg total) by mouth daily. 07/11/23     sertraline (ZOLOFT) 100 MG tablet Take 2  tablets (200 mg total) by mouth daily. 12/06/22     topiramate (TOPAMAX) 100 MG tablet Take 1 tablet (100 mg) by mouth daily. 04/14/23     traMADol (ULTRAM) 50 MG tablet Take 1 tablet (50 mg total) by mouth every 6 (six) hours as needed for pain. 08/01/22     traZODone (DESYREL) 100 MG tablet Take 3 tablets (300 mg total) by mouth  daily. 04/16/23     tretinoin (RETIN-A) 0.05 % cream Apply a pea size amount to the entire face nightly as tolerated 11/25/22         Allergies    Nsaids and Codeine    Review of Systems   Review of Systems  Physical Exam Updated Vital Signs BP (!) 150/80 (BP Location: Left Arm)   Pulse 72   Temp 98.1 F (36.7 C) (Oral)   Ht 5\' 6"  (1.676 m)   Wt 83 kg   SpO2 98%   BMI 29.53 kg/m  Physical Exam Vitals reviewed.  Constitutional:      Appearance: Normal appearance.  HENT:     Head: Normocephalic and atraumatic.     Mouth/Throat:     Mouth: Mucous membranes are moist.  Eyes:     Extraocular Movements: Extraocular movements intact.     Pupils: Pupils are equal, round, and reactive to light.  Cardiovascular:     Rate and Rhythm: Normal rate.  Pulmonary:     Effort: Pulmonary effort is normal.  Abdominal:     General: There is no distension.  Musculoskeletal:     Comments: Point tenderness over the mid clavicle.  No tenderness to palpation over the head of the humerus, humerus or elbow.  Neurological:     General: No focal deficit present.     Mental Status: She is alert.     Cranial Nerves: No cranial nerve deficit.     Sensory: No sensory deficit.     Motor: No weakness.     Comments: Intact median, radial, ulnar nerve sensation and function.     ED Results / Procedures / Treatments   Labs (all labs ordered are listed, but only abnormal results are displayed) Labs Reviewed - No data to display  EKG None  Radiology DG Shoulder Right  Result Date: 07/27/2023 CLINICAL DATA:  Shoulder pain after fall EXAM: RIGHT SHOULDER - 2+ VIEW  COMPARISON:  None Available. FINDINGS: AC joint degenerative change. Findings suspicious for an acute displaced mid to distal clavicle fracture. Glenohumeral alignment is within normal limits. IMPRESSION: Findings suspicious for an acute displaced mid to distal clavicle fracture. Suggest dedicated views of the right clavicle Electronically Signed   By: Jasmine Pang M.D.   On: 07/27/2023 18:48    Procedures Procedures    Medications Ordered in ED Medications  oxyCODONE (Oxy IR/ROXICODONE) immediate release tablet 5 mg (has no administration in time range)  HYDROmorphone (DILAUDID) injection 0.5 mg (0.5 mg Intravenous Given 07/27/23 1749)  ondansetron (ZOFRAN) injection 4 mg (4 mg Intravenous Given 07/27/23 1752)  HYDROmorphone (DILAUDID) injection 0.5 mg (0.5 mg Intravenous Given 07/27/23 1828)    ED Course/ Medical Decision Making/ A&P                                 Medical Decision Making This is a 72 year old female who presents emergency department after a nonsyncopal fall from standing.  Differential diagnoses include clavicle fracture, shoulder dislocation, humerus fracture.  Plan-patient's x-ray shows a clear clavicle fracture.  Have ordered a sling.  Will provide orthopedic follow-up.  Considered CT imaging of the patient's head, however with her history and exam, do not believe it was indicated.  Amount and/or Complexity of Data Reviewed Radiology: ordered.  Risk Prescription drug management.           Final Clinical Impression(s) / ED Diagnoses Final diagnoses:  Open nondisplaced fracture  of right clavicle, unspecified part of clavicle, initial encounter    Rx / DC Orders ED Discharge Orders          Ordered    oxyCODONE (ROXICODONE) 5 MG immediate release tablet  Every 4 hours PRN        07/27/23 1914              [name], DO 07/27/23 1915

## 2023-07-27 NOTE — ED Notes (Signed)
Pt in NAD at dc from ED. A&O. Ambulatory. Respirations even & unlabored. Skin warm & dry. Pt verbalized understanding of d/c teaching including follow up care, medications, pain management and reasons to return to the ED. Pt d/c'd in sling and shoulder immobilizer placed by ortho tech. No needs or questions expressed at d/c.

## 2023-07-27 NOTE — ED Notes (Signed)
X-ray at bedside

## 2023-07-27 NOTE — ED Triage Notes (Signed)
[place] the patient attempted to step over log and fell onto right shoulder with obvious dislocation. Upon arrival to ED pt went to wash hands and felt/heard her shoulder pop resulting in more pain. PT is A&Ox4, NAD noted at this time.

## 2023-07-27 NOTE — Discharge Instructions (Signed)
He can take Tylenol and ibuprofen at home.  You were sent a prescription for oxycodone.  You can take it as needed for severe pain.  Included in your discharge paperwork is a telephone number for an orthopedic doctor.  Please call them tomorrow for a follow-up appointment.  You can tell them that you were diagnosed with a clavicle fracture.

## 2023-07-28 ENCOUNTER — Other Ambulatory Visit (HOSPITAL_COMMUNITY): Payer: Self-pay

## 2023-07-28 ENCOUNTER — Other Ambulatory Visit: Payer: Self-pay

## 2023-07-28 DIAGNOSIS — S42021A Displaced fracture of shaft of right clavicle, initial encounter for closed fracture: Secondary | ICD-10-CM | POA: Diagnosis not present

## 2023-07-28 MED ORDER — CYCLOBENZAPRINE HCL 5 MG PO TABS
5.0000 mg | ORAL_TABLET | Freq: Three times a day (TID) | ORAL | 0 refills | Status: DC | PRN
  Filled 2023-07-28: qty 90, 30d supply, fill #0

## 2023-07-28 MED ORDER — TRAMADOL HCL 50 MG PO TABS
50.0000 mg | ORAL_TABLET | Freq: Four times a day (QID) | ORAL | 0 refills | Status: DC | PRN
  Filled 2023-07-28: qty 20, 5d supply, fill #0

## 2023-07-29 ENCOUNTER — Other Ambulatory Visit: Payer: Self-pay | Admitting: Physician Assistant

## 2023-07-29 ENCOUNTER — Other Ambulatory Visit (HOSPITAL_COMMUNITY): Payer: Self-pay

## 2023-07-29 ENCOUNTER — Other Ambulatory Visit: Payer: Self-pay

## 2023-07-29 DIAGNOSIS — I4719 Other supraventricular tachycardia: Secondary | ICD-10-CM

## 2023-07-29 MED ORDER — FLECAINIDE ACETATE 150 MG PO TABS
150.0000 mg | ORAL_TABLET | Freq: Two times a day (BID) | ORAL | 0 refills | Status: DC
  Filled 2023-07-29: qty 30, 15d supply, fill #0

## 2023-07-29 MED ORDER — ALBUTEROL SULFATE HFA 108 (90 BASE) MCG/ACT IN AERS
2.0000 | INHALATION_SPRAY | RESPIRATORY_TRACT | 0 refills | Status: AC | PRN
  Filled 2023-07-29: qty 20.1, 75d supply, fill #0

## 2023-07-29 MED ORDER — TOPIRAMATE 100 MG PO TABS
100.0000 mg | ORAL_TABLET | Freq: Every day | ORAL | 0 refills | Status: AC
  Filled 2023-07-29: qty 90, 90d supply, fill #0

## 2023-07-30 ENCOUNTER — Other Ambulatory Visit (HOSPITAL_COMMUNITY): Payer: Self-pay

## 2023-07-30 ENCOUNTER — Other Ambulatory Visit: Payer: Self-pay

## 2023-07-30 MED ORDER — TRAZODONE HCL 100 MG PO TABS
300.0000 mg | ORAL_TABLET | Freq: Every day | ORAL | 0 refills | Status: DC
  Filled 2023-07-30 – 2023-09-25 (×2): qty 270, 90d supply, fill #0

## 2023-07-31 ENCOUNTER — Other Ambulatory Visit: Payer: Self-pay

## 2023-08-06 ENCOUNTER — Other Ambulatory Visit: Payer: Self-pay

## 2023-08-06 ENCOUNTER — Other Ambulatory Visit (HOSPITAL_COMMUNITY): Payer: Self-pay

## 2023-08-06 MED ORDER — TRAMADOL HCL 50 MG PO TABS
50.0000 mg | ORAL_TABLET | Freq: Four times a day (QID) | ORAL | 0 refills | Status: DC | PRN
  Filled 2023-08-06: qty 20, 5d supply, fill #0

## 2023-08-07 ENCOUNTER — Other Ambulatory Visit (HOSPITAL_COMMUNITY): Payer: Self-pay

## 2023-08-07 DIAGNOSIS — S42021A Displaced fracture of shaft of right clavicle, initial encounter for closed fracture: Secondary | ICD-10-CM | POA: Diagnosis not present

## 2023-08-07 MED ORDER — TRAMADOL HCL 50 MG PO TABS
50.0000 mg | ORAL_TABLET | Freq: Four times a day (QID) | ORAL | 0 refills | Status: DC | PRN
  Filled 2023-08-07: qty 20, 5d supply, fill #0

## 2023-08-11 ENCOUNTER — Other Ambulatory Visit (HOSPITAL_COMMUNITY): Payer: Self-pay

## 2023-08-12 ENCOUNTER — Other Ambulatory Visit (HOSPITAL_COMMUNITY): Payer: Self-pay

## 2023-08-13 ENCOUNTER — Other Ambulatory Visit: Payer: Self-pay | Admitting: Cardiology

## 2023-08-13 ENCOUNTER — Other Ambulatory Visit (HOSPITAL_COMMUNITY): Payer: Self-pay

## 2023-08-13 ENCOUNTER — Other Ambulatory Visit: Payer: Self-pay

## 2023-08-13 DIAGNOSIS — I4719 Other supraventricular tachycardia: Secondary | ICD-10-CM

## 2023-08-13 MED ORDER — FLECAINIDE ACETATE 150 MG PO TABS
150.0000 mg | ORAL_TABLET | Freq: Two times a day (BID) | ORAL | 0 refills | Status: DC
  Filled 2023-08-13: qty 30, 15d supply, fill #0

## 2023-08-14 ENCOUNTER — Other Ambulatory Visit (HOSPITAL_COMMUNITY): Payer: Self-pay

## 2023-08-14 ENCOUNTER — Other Ambulatory Visit: Payer: Self-pay

## 2023-08-14 MED ORDER — SERTRALINE HCL 100 MG PO TABS
200.0000 mg | ORAL_TABLET | Freq: Every day | ORAL | 0 refills | Status: DC
  Filled 2023-08-14 (×2): qty 180, 90d supply, fill #0

## 2023-08-15 ENCOUNTER — Other Ambulatory Visit (HOSPITAL_COMMUNITY): Payer: Self-pay

## 2023-08-15 ENCOUNTER — Other Ambulatory Visit: Payer: Self-pay | Admitting: Family Medicine

## 2023-08-15 DIAGNOSIS — M8580 Other specified disorders of bone density and structure, unspecified site: Secondary | ICD-10-CM

## 2023-08-15 MED ORDER — CYCLOBENZAPRINE HCL 5 MG PO TABS: 5.0000 mg | ORAL_TABLET | Freq: Every evening | ORAL | 0 refills | Status: DC | PRN

## 2023-08-16 ENCOUNTER — Emergency Department (HOSPITAL_COMMUNITY)
Admission: EM | Admit: 2023-08-16 | Discharge: 2023-08-16 | Disposition: A | Discharge status: Home / Self Care | Payer: Medicare HMO | Attending: Emergency Medicine | Admitting: Emergency Medicine

## 2023-08-16 ENCOUNTER — Emergency Department (HOSPITAL_COMMUNITY): Payer: Medicare HMO

## 2023-08-16 ENCOUNTER — Encounter (HOSPITAL_COMMUNITY): Payer: Self-pay

## 2023-08-16 ENCOUNTER — Other Ambulatory Visit: Payer: Self-pay

## 2023-08-16 ENCOUNTER — Other Ambulatory Visit (HOSPITAL_COMMUNITY): Payer: Self-pay

## 2023-08-16 DIAGNOSIS — S4991XA Unspecified injury of right shoulder and upper arm, initial encounter: Secondary | ICD-10-CM | POA: Diagnosis not present

## 2023-08-16 DIAGNOSIS — Z743 Need for continuous supervision: Secondary | ICD-10-CM | POA: Diagnosis not present

## 2023-08-16 DIAGNOSIS — W010XXA Fall on same level from slipping, tripping and stumbling without subsequent striking against object, initial encounter: Secondary | ICD-10-CM | POA: Diagnosis not present

## 2023-08-16 DIAGNOSIS — S0011XA Contusion of right eyelid and periocular area, initial encounter: Secondary | ICD-10-CM | POA: Diagnosis not present

## 2023-08-16 DIAGNOSIS — M19011 Primary osteoarthritis, right shoulder: Secondary | ICD-10-CM | POA: Diagnosis not present

## 2023-08-16 DIAGNOSIS — S0990XA Unspecified injury of head, initial encounter: Secondary | ICD-10-CM | POA: Diagnosis not present

## 2023-08-16 DIAGNOSIS — W19XXXA Unspecified fall, initial encounter: Secondary | ICD-10-CM | POA: Diagnosis not present

## 2023-08-16 DIAGNOSIS — R519 Headache, unspecified: Secondary | ICD-10-CM | POA: Insufficient documentation

## 2023-08-16 DIAGNOSIS — M542 Cervicalgia: Secondary | ICD-10-CM | POA: Diagnosis not present

## 2023-08-16 DIAGNOSIS — S42021A Displaced fracture of shaft of right clavicle, initial encounter for closed fracture: Secondary | ICD-10-CM | POA: Diagnosis not present

## 2023-08-16 DIAGNOSIS — S0511XA Contusion of eyeball and orbital tissues, right eye, initial encounter: Secondary | ICD-10-CM

## 2023-08-16 DIAGNOSIS — H1131 Conjunctival hemorrhage, right eye: Secondary | ICD-10-CM | POA: Insufficient documentation

## 2023-08-16 DIAGNOSIS — Y92009 Unspecified place in unspecified non-institutional (private) residence as the place of occurrence of the external cause: Secondary | ICD-10-CM

## 2023-08-16 DIAGNOSIS — S42021D Displaced fracture of shaft of right clavicle, subsequent encounter for fracture with routine healing: Secondary | ICD-10-CM

## 2023-08-16 DIAGNOSIS — I10 Essential (primary) hypertension: Secondary | ICD-10-CM | POA: Diagnosis not present

## 2023-08-16 DIAGNOSIS — S42001A Fracture of unspecified part of right clavicle, initial encounter for closed fracture: Secondary | ICD-10-CM | POA: Diagnosis not present

## 2023-08-16 DIAGNOSIS — M25519 Pain in unspecified shoulder: Secondary | ICD-10-CM | POA: Diagnosis not present

## 2023-08-16 MED ORDER — CYCLOBENZAPRINE HCL 10 MG PO TABS
10.0000 mg | ORAL_TABLET | Freq: Every day | ORAL | 0 refills | Status: AC
  Filled 2023-08-16: qty 20, 20d supply, fill #0

## 2023-08-16 MED ORDER — ACETAMINOPHEN 325 MG PO TABS
650.0000 mg | ORAL_TABLET | Freq: Once | ORAL | Status: AC
  Administered 2023-08-16: 650 mg via ORAL
  Filled 2023-08-16: qty 2

## 2023-08-16 MED ORDER — TRAMADOL HCL 50 MG PO TABS
50.0000 mg | ORAL_TABLET | Freq: Four times a day (QID) | ORAL | 0 refills | Status: DC | PRN
  Filled 2023-08-16: qty 15, 4d supply, fill #0

## 2023-08-16 MED ORDER — FENTANYL CITRATE PF 50 MCG/ML IJ SOSY
50.0000 ug | PREFILLED_SYRINGE | Freq: Once | INTRAMUSCULAR | Status: AC
  Administered 2023-08-16: 50 ug via INTRAVENOUS
  Filled 2023-08-16: qty 1

## 2023-08-16 NOTE — ED Provider Notes (Signed)
[place] EMERGENCY DEPARTMENT AT [facility] Provider Note   CSN: [id number] Arrival date & time: 08/16/23  0745     History  Chief Complaint  Patient presents with   Rebecca Marshall    Rebecca Marshall is a 72 y.o. female.  Patient is a 72 year old female with a past medical history of atrial tachycardia and fibromyalgia as well as recent right clavicle fracture presented to the emergency department after a fall.  The patient states that she slipped on her floor this morning and landed on her face.  She denies any loss of consciousness.  She states that she was unable to get herself up or ambulate after the fall.  She denies feeling lightheaded or dizzy or having any chest pain prior to the fall.  She states that she is having a headache, some right sided neck pain and worsening pain in her right clavicle.  She denies any numbness or weakness or pain in the rest of her arms or legs.  The history is provided by the patient.  Fall       Home Medications Prior to Admission medications   Medication Sig Start Date End Date Taking? Authorizing Provider  acetaminophen (TYLENOL) 500 MG tablet Take 1,000 mg by mouth every 8 (eight) hours as needed for mild pain or moderate pain.     [provider]  albuterol (VENTOLIN HFA) 108 (90 Base) MCG/ACT inhaler Inhale 2 puffs into the lungs every 4 (four) hours as needed. 07/29/23     amoxicillin-clavulanate (AUGMENTIN) 875-125 MG tablet Take 1 tablet by mouth every 12 hours for 10 days 01/01/22     celecoxib (CELEBREX) 200 MG capsule Take 1 capsule (200 mg total) by mouth 2 (two) times daily. 09/05/22     cholestyramine (QUESTRAN) 4 GM/DOSE powder Use 1 scoop by mouth 1 - 2 times a day 10/30/22     colestipol (COLESTID) 1 g tablet Take 1 tablet (1 g total) by mouth 2 times daily. 07/26/21     colestipol (COLESTID) 1 g tablet Take 1 tablet (1 g total) by mouth 2 (two) times daily. 12/06/22     CREON 36000-114000 units CPEP capsule Take 2  capsules by mouth 3 times daily with meals. 07/26/21     cyclobenzaprine (FLEXERIL) 10 MG tablet Take 1 tablet (10 mg total) by mouth at bedtime. 08/16/23   [name], DO  cycloSPORINE (RESTASIS) 0.05 % ophthalmic emulsion Place 1 drop in each eye twice a day as directed. 11/30/20     cycloSPORINE (RESTASIS) 0.05 % ophthalmic emulsion Place 1 drop into both eyes 2 (two) times daily as directed. 09/30/22     cycloSPORINE (RESTASIS) 0.05 % ophthalmic emulsion Place 1 drop into both eyes 2 (two) times daily. 12/06/22     cycloSPORINE (RESTASIS) 0.05 % ophthalmic emulsion Place 1 drop into both eyes 2 (two) times daily. 03/06/23     diazepam (VALIUM) 10 MG tablet Take 10 mg by mouth daily as needed. 02/15/20   [provider]  diazepam (VALIUM) 10 MG tablet Take 1 tablet by mouth once daily as needed for headache 12/17/21     dicyclomine (BENTYL) 20 MG tablet Take 1 tablet (20 mg total) by mouth 3 (three) times daily as needed. 10/30/22     eletriptan (RELPAX) 40 MG tablet Take by mouth one at the beginning of headache and may repeat once in 2  Hours for maximum of 2 tabs in 24hours 04/05/15   [name], MD  eletriptan (RELPAX) 40 MG tablet Take 1 tablet by mouth at onset, may repeat in 2 hours if needed. 07/22/23     flecainide (TAMBOCOR) 150 MG tablet Take 1 tablet (150 mg total) by mouth 2 (two) times daily. Please call our office to schedule an overdue appointment with Dr. Jens Som before anymore refills. [phone]. Thank you 3rd & Final Attempt 08/13/23   [name], MD  frovatriptan (FROVA) 2.5 MG tablet Take 1 tablet by mouth once a day, repeat in 2 hours if needed 03/15/22     HYDROcodone-acetaminophen (NORCO/VICODIN) 5-325 MG tablet Take 1 tablet by mouth every six hours as needed for pain 09/02/22     Hyoscyamine Sulfate SL 0.125 MG SUBL Place 1 tablet under the tongue and allow to dissolve 3 times a day as needed 12/06/22     levothyroxine (SYNTHROID) 137 MCG tablet Take 1  tablet by mouth once a day. 02/13/22     levothyroxine (SYNTHROID) 137 MCG tablet Take 1 tablet (137 mcg total) by mouth daily 6 days a week 11/26/22     levothyroxine (SYNTHROID) 137 MCG tablet Take 1 tablet (137 mcg total) by mouth 6 times a week 11/26/22     levothyroxine (SYNTHROID) 137 MCG tablet Take 1 tablet (137 mcg total) by mouth every morning on an empty stomach 02/21/23     levothyroxine (SYNTHROID) 137 MCG tablet Take 1 tablet (137 mcg total) by mouth in the morning on an empty stomach. 07/09/23     levothyroxine (SYNTHROID) 150 MCG tablet TAKE 1 TABLET BY MOUTH EVERY MORNING ON AN EMPTY STOMACH 12/06/20 12/06/21  [name], MD  levothyroxine (SYNTHROID) 75 MCG tablet Take 1 tablet by mouth in the morning on an empty stomach. Take on Sunday. 08/19/22     lipase/protease/amylase (CREON) 36000 UNITS CPEP capsule Take 2 capsules by mouth with meals 3 times daily. 01/15/22     lipase/protease/amylase (CREON) 36000 UNITS CPEP capsule Take 2 capsules (72,000 Units total) by mouth 3 (three) times daily with each meal 01/27/23     mupirocin cream (BACTROBAN) 2 % Apply to affected areas 2 times a day for 7 days 01/01/22     omeprazole (PRILOSEC) 20 MG capsule Take 1 capsule (20 mg total) by mouth daily 30 minutes before morning meal 05/28/23     ondansetron (ZOFRAN-ODT) 4 MG disintegrating tablet Dissolve 1 tablet (4 mg total) in the mouth every 8 (eight) hours. 04/17/23     oxyCODONE (ROXICODONE) 5 MG immediate release tablet Take 1 tablet (5 mg total) by mouth every 4 (four) hours as needed for severe pain (pain score 7-10). 07/27/23   [name], DO  Pancrelipase, Lip-Prot-Amyl, (CREON PO) Take by mouth 3 (three) times daily. Pt takes 2 tablets with each meal.    [provider]  Pancrelipase, Lip-Prot-Amyl, (PANCREAZE) 37000-97300 units CPEP Take 3 capsules (111,000 Units total) by mouth 3 (three) times daily with each meal 10/30/22     Pancrelipase, Lip-Prot-Amyl, (PANCREAZE) 37000-97300  units CPEP Take 3 capsules (111,000 Units total) by mouth 3 (three) times daily with meals. 01/22/23     primidone (MYSOLINE) 50 MG tablet Take 1 tablet by mouth at bedtime. 12/17/21   Anson Fret, MD  RESTASIS 0.05 % ophthalmic emulsion Instill 1 drop into both eyes twice a day 01/14/22     rosuvastatin (CRESTOR) 5 MG tablet Take 1 tablet (5 mg total) by mouth daily. 07/11/23     sertraline (ZOLOFT) 100 MG tablet Take 2 tablets (200  mg total) by mouth daily. Please scheduled follow up visit 08/14/23     topiramate (TOPAMAX) 100 MG tablet Take 1 tablet (100 mg total) by mouth daily. 07/29/23     traMADol (ULTRAM) 50 MG tablet Take 1 tablet (50 mg total) by mouth every 6 (six) hours as needed for pain. 08/16/23   Rexford Maus, DO  traZODone (DESYREL) 100 MG tablet Take 3 tablets (300 mg total) by mouth at bedtime. 07/30/23     tretinoin (RETIN-A) 0.05 % cream Apply a pea size amount to the entire face nightly as tolerated 11/25/22         Allergies    Nsaids, Oxycodone, and Codeine    Review of Systems   Review of Systems  Physical Exam Updated Vital Signs BP (!) 146/75   Pulse 62   Temp 98.3 F (36.8 C)   Resp 16   Ht 5\' 6"  (1.676 m)   Wt 83 kg   SpO2 95%   BMI 29.53 kg/m  Physical Exam Vitals and nursing note reviewed.  Constitutional:      General: She is not in acute distress.    Appearance: Normal appearance.  HENT:     Head: Normocephalic and atraumatic.     Nose: Nose normal.     Comments: No septal hematoma    Mouth/Throat:     Mouth: Mucous membranes are moist.     Pharynx: Oropharynx is clear.  Eyes:     Extraocular Movements: Extraocular movements intact.     Pupils: Pupils are equal, round, and reactive to light.     Comments: Right eye subconjunctival hemorrhage with surrounding periorbital contusion  Neck:     Comments: C-collar in place, right sided cervical paraspinal muscle tenderness to palpation Cardiovascular:     Rate and Rhythm: Normal rate  and regular rhythm.     Heart sounds: Normal heart sounds.  Pulmonary:     Effort: Pulmonary effort is normal.     Breath sounds: Normal breath sounds.  Abdominal:     General: Abdomen is flat.     Palpations: Abdomen is soft.     Tenderness: There is no abdominal tenderness.  Musculoskeletal:     Comments: No midline back tenderness Tenderness to palpation of right clavicle with palpable deformity, no tenderness to remainder of right upper extremity, no tenderness to left upper extremity or bilateral lower extremities  Skin:    General: Skin is warm and dry.  Neurological:     General: No focal deficit present.     Mental Status: She is alert and oriented to person, place, and time.     Sensory: No sensory deficit.     Motor: No weakness.  Psychiatric:        Mood and Affect: Mood normal.        Behavior: Behavior normal.     ED Results / Procedures / Treatments   Labs (all labs ordered are listed, but only abnormal results are displayed) Labs Reviewed - No data to display  EKG None  Radiology CT Head Wo Contrast  Result Date: 08/16/2023 CLINICAL DATA:  Status post fall.  Complains of neck pain. EXAM: CT HEAD WITHOUT CONTRAST CT MAXILLOFACIAL WITHOUT CONTRAST CT CERVICAL SPINE WITHOUT CONTRAST TECHNIQUE: Multidetector CT imaging of the head, cervical spine, and maxillofacial structures were performed using the standard protocol without intravenous contrast. Multiplanar CT image reconstructions of the cervical spine and maxillofacial structures were also generated. RADIATION DOSE REDUCTION: This exam was performed according  to the departmental dose-optimization program which includes automated exposure control, adjustment of the mA and/or kV according to patient size and/or use of iterative reconstruction technique. COMPARISON:  01/23/2019 FINDINGS: CT HEAD FINDINGS Brain: No evidence of acute infarction, hemorrhage, hydrocephalus, extra-axial collection or mass lesion/mass  effect. Vascular: No hyperdense vessel or unexpected calcification. Skull: Normal. Negative for fracture or focal lesion. Other: No scalp hematoma. CT MAXILLOFACIAL FINDINGS Osseous: No fracture or mandibular dislocation. No destructive process. Orbits: Negative. No traumatic or inflammatory finding. Sinuses: Clear. Soft tissues: Right periorbital hematoma measures 4.1 x 1.1 cm, image 55/3. CT CERVICAL SPINE FINDINGS Alignment: Normal. Skull base and vertebrae: No acute fracture. No primary bone lesion or focal pathologic process. Soft tissues and spinal canal: No prevertebral fluid or swelling. No visible canal hematoma. Disc levels:  Mild multilevel degenerative disc disease. Upper chest: Soft tissue stranding identified within the right supraclavicular region. Status post thyroidectomy. Lung apices appear clear. Other: None IMPRESSION: 1. No acute intracranial abnormalities. 2. No evidence for facial bone fracture. 3. Right periorbital hematoma. 4. No evidence for cervical spine fracture or subluxation. 5. Soft tissue stranding identified within the right supraclavicular region. Likely posttraumatic and possibly related to suspected right clavicle fracture noted on shoulder radiograph from 07/27/2023. Consider dedicated views of the right shoulder region. Electronically Signed   By: Signa Kell M.D.   On: 08/16/2023 09:10   CT Cervical Spine Wo Contrast  Result Date: 08/16/2023 CLINICAL DATA:  Status post fall.  Complains of neck pain. EXAM: CT HEAD WITHOUT CONTRAST CT MAXILLOFACIAL WITHOUT CONTRAST CT CERVICAL SPINE WITHOUT CONTRAST TECHNIQUE: Multidetector CT imaging of the head, cervical spine, and maxillofacial structures were performed using the standard protocol without intravenous contrast. Multiplanar CT image reconstructions of the cervical spine and maxillofacial structures were also generated. RADIATION DOSE REDUCTION: This exam was performed according to the departmental dose-optimization  program which includes automated exposure control, adjustment of the mA and/or kV according to patient size and/or use of iterative reconstruction technique. COMPARISON:  01/23/2019 FINDINGS: CT HEAD FINDINGS Brain: No evidence of acute infarction, hemorrhage, hydrocephalus, extra-axial collection or mass lesion/mass effect. Vascular: No hyperdense vessel or unexpected calcification. Skull: Normal. Negative for fracture or focal lesion. Other: No scalp hematoma. CT MAXILLOFACIAL FINDINGS Osseous: No fracture or mandibular dislocation. No destructive process. Orbits: Negative. No traumatic or inflammatory finding. Sinuses: Clear. Soft tissues: Right periorbital hematoma measures 4.1 x 1.1 cm, image 55/3. CT CERVICAL SPINE FINDINGS Alignment: Normal. Skull base and vertebrae: No acute fracture. No primary bone lesion or focal pathologic process. Soft tissues and spinal canal: No prevertebral fluid or swelling. No visible canal hematoma. Disc levels:  Mild multilevel degenerative disc disease. Upper chest: Soft tissue stranding identified within the right supraclavicular region. Status post thyroidectomy. Lung apices appear clear. Other: None IMPRESSION: 1. No acute intracranial abnormalities. 2. No evidence for facial bone fracture. 3. Right periorbital hematoma. 4. No evidence for cervical spine fracture or subluxation. 5. Soft tissue stranding identified within the right supraclavicular region. Likely posttraumatic and possibly related to suspected right clavicle fracture noted on shoulder radiograph from 07/27/2023. Consider dedicated views of the right shoulder region. Electronically Signed   By: Signa Kell M.D.   On: 08/16/2023 09:10   CT Maxillofacial WO CM  Result Date: 08/16/2023 CLINICAL DATA:  Status post fall.  Complains of neck pain. EXAM: CT HEAD WITHOUT CONTRAST CT MAXILLOFACIAL WITHOUT CONTRAST CT CERVICAL SPINE WITHOUT CONTRAST TECHNIQUE: Multidetector CT imaging of the head, cervical spine,  and maxillofacial  structures were performed using the standard protocol without intravenous contrast. Multiplanar CT image reconstructions of the cervical spine and maxillofacial structures were also generated. RADIATION DOSE REDUCTION: This exam was performed according to the departmental dose-optimization program which includes automated exposure control, adjustment of the mA and/or kV according to patient size and/or use of iterative reconstruction technique. COMPARISON:  01/23/2019 FINDINGS: CT HEAD FINDINGS Brain: No evidence of acute infarction, hemorrhage, hydrocephalus, extra-axial collection or mass lesion/mass effect. Vascular: No hyperdense vessel or unexpected calcification. Skull: Normal. Negative for fracture or focal lesion. Other: No scalp hematoma. CT MAXILLOFACIAL FINDINGS Osseous: No fracture or mandibular dislocation. No destructive process. Orbits: Negative. No traumatic or inflammatory finding. Sinuses: Clear. Soft tissues: Right periorbital hematoma measures 4.1 x 1.1 cm, image 55/3. CT CERVICAL SPINE FINDINGS Alignment: Normal. Skull base and vertebrae: No acute fracture. No primary bone lesion or focal pathologic process. Soft tissues and spinal canal: No prevertebral fluid or swelling. No visible canal hematoma. Disc levels:  Mild multilevel degenerative disc disease. Upper chest: Soft tissue stranding identified within the right supraclavicular region. Status post thyroidectomy. Lung apices appear clear. Other: None IMPRESSION: 1. No acute intracranial abnormalities. 2. No evidence for facial bone fracture. 3. Right periorbital hematoma. 4. No evidence for cervical spine fracture or subluxation. 5. Soft tissue stranding identified within the right supraclavicular region. Likely posttraumatic and possibly related to suspected right clavicle fracture noted on shoulder radiograph from 07/27/2023. Consider dedicated views of the right shoulder region. Electronically Signed   By: Signa Kell  M.D.   On: 08/16/2023 09:10   DG Clavicle Right  Result Date: 08/16/2023 CLINICAL DATA:  Fall.  Right shoulder pain. EXAM: RIGHT CLAVICLE - 2+ VIEWS COMPARISON:  07/27/2023. FINDINGS: There is a mildly displaced fracture of the right clavicle at the junction of medial 2/3 and lateral 1/3 shaft. No other acute fracture or dislocation. No aggressive osseous lesion. Glenohumeral and acromioclavicular joints are normal in alignment. Moderate osteoarthritis of the acromioclavicular joint. Mild osteoarthritis of the glenohumeral joint. No soft tissue swelling. No radiopaque foreign bodies. IMPRESSION: *Acute mildly displaced fracture of the right clavicle. Electronically Signed   By: Jules Schick M.D.   On: 08/16/2023 09:01    Procedures Procedures    Medications Ordered in ED Medications  acetaminophen (TYLENOL) tablet 650 mg (650 mg Oral Given 08/16/23 0912)  fentaNYL (SUBLIMAZE) injection 50 mcg (50 mcg Intravenous Given 08/16/23 0911)    ED Course/ Medical Decision Making/ A&P Clinical Course as of 08/16/23 0947  Sat Aug 16, 2023  0904 Clavicle fracture mildly displaced, does not appear significantly changed from last x-ray. [VK]  0919 No acute traumatic injury on CTH/C-spine/Max face, stranding in R supraclavicular region likely related to clavicle fracture. Patient will have c-collar cleared and is stable for discharge home. Recommended to have close follow up with her orthopedist.  [VK]    Clinical Course User Index [VK] Rexford Maus, DO                                 Medical Decision Making This patient presents to the ED with chief complaint(s) of fall with pertinent past medical history of atrial tachycardia, fibromyalgia, recent clavicle fracture which further complicates the presenting complaint. The complaint involves an extensive differential diagnosis and also carries with it a high risk of complications and morbidity.    The differential diagnosis includes ICH,  mass effect, cervical spine fracture, facial  fracture, worsening clavicle fracture, no other traumatic injury seen on exam, no presyncopal symptoms making a syncopal fall unlikely  Additional history obtained: Additional history obtained from N/A Records reviewed recent ED records  ED Course and Reassessment: Patient's arrival she is hemodynamically stable in no acute distress.  Was placed in c-collar prehospital arrival.  Patient will have head CT, C-spine, max face as well as right clavicle x-ray to evaluate for traumatic injury.  She will be given pain control and will be closely reassessed.  Independent labs interpretation:  N/A  Independent visualization of imaging: - I independently visualized the following imaging with scope of interpretation limited to determining acute life threatening conditions related to emergency care: CTH/C-spine, CT max/face, R clavicle x-ray, which revealed acute minimally displaced R clavicle fracture not significantly changed from last XR, otherwise no new traumatic injury  Consultation: - Consulted or discussed management/test interpretation w/ external professional: N/A  Consideration for admission or further workup: Patient has no emergent conditions requiring admission or further work-up at this time and is stable for discharge home with primary care and ortho follow-up  Social Determinants of health: N/A    Amount and/or Complexity of Data Reviewed Radiology: ordered.  Risk OTC drugs. Prescription drug management.          Final Clinical Impression(s) / ED Diagnoses Final diagnoses:  Fall in home, initial encounter  Periorbital contusion of right eye, initial encounter  Subconjunctival hemorrhage of right eye  Closed displaced fracture of shaft of right clavicle with routine healing, subsequent encounter    Rx / DC Orders ED Discharge Orders          Ordered    cyclobenzaprine (FLEXERIL) 10 MG tablet  Daily at bedtime         08/16/23 0945    traMADol (ULTRAM) 50 MG tablet  Every 6 hours PRN        08/16/23 0945              Rexford Maus, DO 08/16/23 [id number]

## 2023-08-16 NOTE — ED Triage Notes (Signed)
Pt biba for mechanical fall at home. NO LOC. NO blood thinners. Patient reports R shoulder pain and neck pain. Arrived with C collar in place. Patient Had a shoulder injury and was already in sling prior to fall this morning. Denies any other pain.

## 2023-08-16 NOTE — Discharge Instructions (Addendum)
You were seen in the emergency department after your fall.  You had no new injuries and did not significantly worsen your clavicle fracture.  You do have bruising to your eye and you can ice your eye and keep your head elevated to help with the swelling.  You can take Tylenol every 6 hours as needed for pain and you can take tramadol or Flexeril as needed for breakthrough pain.  These can both make you drowsy so do not take them together and do not drive, work or operate heavy machinery while you are taking them.  The tramadol can also make you constipated so I recommend taking a stool softener with this.  You should follow-up with your orthopedist as scheduled to have your clavicle rechecked and can follow-up with your primary doctor as needed.  You should return to the emergency department for significantly worsening pain, severe headaches or any other new or concerning symptoms.

## 2023-08-17 ENCOUNTER — Other Ambulatory Visit: Payer: Self-pay

## 2023-08-18 ENCOUNTER — Encounter: Payer: Self-pay | Admitting: Adult Health

## 2023-08-18 ENCOUNTER — Ambulatory Visit: Payer: Medicare HMO | Attending: Adult Health | Admitting: Adult Health

## 2023-08-18 VITALS — BP 114/72 | HR 69 | Ht 66.0 in | Wt 188.8 lb

## 2023-08-18 DIAGNOSIS — E0590 Thyrotoxicosis, unspecified without thyrotoxic crisis or storm: Secondary | ICD-10-CM | POA: Diagnosis not present

## 2023-08-18 DIAGNOSIS — I4719 Other supraventricular tachycardia: Secondary | ICD-10-CM | POA: Diagnosis not present

## 2023-08-18 DIAGNOSIS — E7800 Pure hypercholesterolemia, unspecified: Secondary | ICD-10-CM | POA: Diagnosis not present

## 2023-08-18 MED ORDER — FLECAINIDE ACETATE 150 MG PO TABS
150.0000 mg | ORAL_TABLET | Freq: Two times a day (BID) | ORAL | 3 refills | Status: AC
  Filled 2023-08-18 – 2023-09-09 (×3): qty 180, 90d supply, fill #0
  Filled 2024-03-09: qty 180, 90d supply, fill #1

## 2023-08-18 NOTE — Progress Notes (Signed)
Cardiology Office Note:  .   Date:  08/18/2023  ID:  Rebecca Marshall, DOB 1951/08/20, MRN [id number] PCP: Sigmund Hazel, MD  [facility] Providers Cardiologist:  Olga Millers, MD }   History of Present Illness: .    Rebecca Marshall is a 72 y.o. female with a past medical history of atrial tachycardia,  HL,  controlled on flecainide, and reported history of bradycardia and fibromyalgia as well as recent right clavicle fracture presented to the emergency department after a mechanical fal on 08/16/2023.   She is an [employer] retired, and states she has had multiple falls.  She has chronic diarrhea from her medications and pancreatic insufficiency for which she takes medications.  She does not eat fatty foods which tend to exacerbate the diarrhea.  She did pass out once in the shower after a bout of diarrhea and hypotension.  She now takes Imodium daily.   She is here for refills on flecainide 150 mg twice a day.  She denies any rapid heart rhythm, palpitations, or near syncope currently.  Studies Reviewed: .    NM Study 12/13/2015 Nuclear stress EF: 72%. Blood pressure demonstrated a hypertensive response to exercise. There was no ST segment deviation noted during stress. No T wave inversion was noted during stress. Defect 1: There is a small defect of mild severity present in the apical lateral location. This is a low risk study.    EKG Interpretation Date/Time:  Monday August 18 2023 15:54:27 EST Ventricular Rate:  69 PR Interval:  218 QRS Duration:  116 QT Interval:  458 QTC Calculation: 490 R Axis:   -40  Text Interpretation: Sinus rhythm with 1st degree A-V block Left axis deviation Left ventricular hypertrophy with QRS widening ( R in aVL , Cornell product ) Nonspecific T wave abnormality Prolonged QT When compared with ECG of 05-Dec-2019 12:55, PREVIOUS ECG IS PRESENT Confirmed by Joni Reining [phone]) on 08/18/2023 5:32:37 PM    Physical Exam:   VS:  BP 114/72 (BP  Location: Left Arm, Patient Position: Sitting, Cuff Size: Normal)   Pulse 69   Ht 5\' 6"  (1.676 m)   Wt 188 lb 12.8 oz (85.6 kg)   SpO2 93%   BMI 30.47 kg/m    Wt Readings from Last 3 Encounters:  08/18/23 188 lb 12.8 oz (85.6 kg)  08/16/23 182 lb 15.7 oz (83 kg)  07/27/23 182 lb 15.7 oz (83 kg)    GEN: Well nourished, well developed in no acute distress.  Ecchymosis periorbital with injection of the sclera on the right. NECK: No JVD; No carotid bruits CARDIAC: RRR, no murmurs, rubs, gallops RESPIRATORY:  Clear to auscultation without rales, wheezing or rhonchi  ABDOMEN: Soft, non-tender, non-distended EXTREMITIES:  No edema; No deformity she is wearing her brace to her right arm and shoulder.  ASSESSMENT AND PLAN: .    Atrial tachycardia: She remains on flecainide 150 mg twice daily.  I reviewed her EKG with Dr. Royann Shivers who is onsite today due to the prolonged QTc.  He has reviewed prior EKGs along with me and has noticed a prolongation slowly over time.  Will reach out to Dr. Graciela Husbands to ask him about dosing on flecainide.  I did also go over her medications to check for QT prolongation.  She is on an SSRI sertraline 200 mg daily.  Will wait for Dr. Odessa Fleming recommendations.  She is given refills on the flecainide for now.  I have placed a 30-day ZIO monitor to  evaluate for arrhythmias which may be causing near syncope and also evaluation as result of prolongation of QTc.  I am also checking a CMET and a magnesium due to frequent diarrhea.  2.  Hyperthyroidism: She remains on levothyroxine 137 mcg daily and is followed by PCP.  3.  Hypercholesterolemia: Goal of LDL less than 100.  She remains on rosuvastatin 5 mg daily.         Signed, Bettey Mare. Liborio Nixon, ANP, AACC

## 2023-08-18 NOTE — Patient Instructions (Addendum)
Medication Instructions:  No Changes *If you need a refill on your cardiac medications before your next appointment, please call your pharmacy*   Lab Work: CMET, magnesium Level If you have labs (blood work) drawn today and your tests are completely normal, you will receive your results only by: MyChart Message (if you have MyChart) OR A paper copy in the mail If you have any lab test that is abnormal or we need to change your treatment, we will call you to review the results.   Testing/Procedures: [employer] Cardiac Event Monitor Instructions  Your physician has requested you wear your cardiac event monitor for _30____ days, . [employer] may call or text to confirm a shipping address. The monitor will be sent to a land address via UPS. [employer] will not ship a monitor to a PO BOX. It typically takes 3-5 days to receive your monitor after it has been enrolled. [employer] will assist with USPS tracking if your package is delayed. The telephone number for [employer] is [phone]. Once you have received your monitor, please review the enclosed instructions. Instruction tutorials can also be viewed under help and settings on the enclosed cell phone. Your monitor has already been registered assigning a specific monitor serial # to you.  Billing and Self Pay Discount Information  [employer] has been provided the insurance information we had on file for you.  If your insurance has been updated, please call [employer] at [phone] to provide them with your updated insurance information.   [employer] offers a discounted Self Pay option for patients who have insurance that does not cover their cardiac event monitor or patients without insurance.  The discounted cost of a Self Pay Cardiac Event Monitor would be $225.00 , if the patient contacts [employer] at [phone] within 7 days of applying the monitor to make payment arrangements.  If the patient does not contact [employer] within 7  days of applying the monitor, the cost of the cardiac event monitor will be $350.00.  Applying the monitor  Remove cell phone from case and turn it on. The cell phone works as IT consultant and needs to be within [employer] of you at all times. The cell phone will need to be charged on a daily basis. We recommend you plug the cell phone into the enclosed charger at your bedside table every night.  Monitor batteries: You will receive two monitor batteries labelled #1 and #2. These are your recorders. Plug battery #2 onto the second connection on the enclosed charger. Keep one battery on the charger at all times. This will keep the monitor battery deactivated. It will also keep it fully charged for when you need to switch your monitor batteries. A small light will be blinking on the battery emblem when it is charging. The light on the battery emblem will remain on when the battery is fully charged.  Open package of a Monitor strip. Insert battery #1 into black hood on strip and gently squeeze monitor battery onto connection as indicated in instruction booklet. Set aside while preparing skin.  Choose location for your strip, vertical or horizontal, as indicated in the instruction booklet. Shave to remove all hair from location. There cannot be any lotions, oils, powders, or colognes on skin where monitor is to be applied. Wipe skin clean with enclosed Saline wipe. Dry skin completely.  Peel paper labeled #1 off the back of the Monitor strip exposing the adhesive. Place the monitor on the chest in the vertical or horizontal position shown in  the instruction booklet. One arrow on the monitor strip must be pointing upward. Carefully remove paper labeled #2, attaching remainder of strip to your skin. Try not to create any folds or wrinkles in the strip as you apply it.  Firmly press and release the circle in the center of the monitor battery. You will hear a small beep. This is turning the  monitor battery on. The heart emblem on the monitor battery will light up every 5 seconds if the monitor battery in turned on and connected to the patient securely. Do not push and hold the circle down as this turns the monitor battery off. The cell phone will locate the monitor battery. A screen will appear on the cell phone checking the connection of your monitor strip. This may read poor connection initially but change to good connection within the next minute. Once your monitor accepts the connection you will hear a series of 3 beeps followed by a climbing crescendo of beeps. A screen will appear on the cell phone showing the two monitor strip placement options. Touch the picture that demonstrates where you applied the monitor strip.  Your monitor strip and battery are waterproof. You are able to shower, bathe, or swim with the monitor on. They just ask you do not submerge deeper than 3 feet underwater. We recommend removing the monitor if you are swimming in a lake, river, or ocean.  Your monitor battery will need to be switched to a fully charged monitor battery approximately once a week. The cell phone will alert you of an action which needs to be made.  On the cell phone, tap for details to reveal connection status, monitor battery status, and cell phone battery status. The green dots indicates your monitor is in good status. A red dot indicates there is something that needs your attention.  To record a symptom, click the circle on the monitor battery. In 30-60 seconds a list of symptoms will appear on the cell phone. Select your symptom and tap save. Your monitor will record a sustained or significant arrhythmia regardless of you clicking the button. Some patients do not feel the heart rhythm irregularities. [employer] will notify us of any serious or critical events.  Refer to instruction booklet for instructions on switching batteries, changing strips, the Do not disturb or  Pause features, or any additional questions.  Call [employer] at [phone], to confirm your monitor is transmitting and record your baseline. They will answer any questions you may have regarding the monitor instructions at that time.  Returning the monitor to [employer]  Place all equipment back into blue box. Peel off strip of paper to expose adhesive and close box securely. There is a prepaid UPS shipping label on this box. Drop in a UPS drop box, or at a UPS facility like [employer]. You may also contact [employer] to arrange UPS to pick up monitor package at your home.    Follow-Up: At [facility], you and your health needs are our priority.  As part of our continuing mission to provide you with exceptional heart care, we have created designated Provider Care Teams.  These Care Teams include your primary Cardiologist (physician) and Advanced Practice Providers (APPs -  Physician Assistants and Nurse Practitioners) who all work together to provide you with the care you need, when you need it.  We recommend signing up for the patient portal called "MyChart".  Sign up information is provided on this After Visit Summary.  MyChart is used to  connect with patients for Virtual Visits (Telemedicine).  Patients are able to view lab/test results, encounter notes, upcoming appointments, etc.  Non-urgent messages can be sent to your provider as well.   To learn more about what you can do with MyChart, go to [email or web address].    Your next appointment:   6 week(s)  Provider:   Joni Reining, DNP, ANP

## 2023-08-19 ENCOUNTER — Encounter: Payer: Self-pay | Admitting: *Deleted

## 2023-08-19 ENCOUNTER — Other Ambulatory Visit: Payer: Self-pay

## 2023-08-19 ENCOUNTER — Other Ambulatory Visit: Payer: Self-pay | Admitting: Adult Health

## 2023-08-19 ENCOUNTER — Other Ambulatory Visit (HOSPITAL_COMMUNITY): Payer: Self-pay

## 2023-08-19 DIAGNOSIS — E0590 Thyrotoxicosis, unspecified without thyrotoxic crisis or storm: Secondary | ICD-10-CM

## 2023-08-19 DIAGNOSIS — I4719 Other supraventricular tachycardia: Secondary | ICD-10-CM

## 2023-08-19 DIAGNOSIS — R9431 Abnormal electrocardiogram [ECG] [EKG]: Secondary | ICD-10-CM

## 2023-08-19 DIAGNOSIS — E7800 Pure hypercholesterolemia, unspecified: Secondary | ICD-10-CM

## 2023-08-19 DIAGNOSIS — I440 Atrioventricular block, first degree: Secondary | ICD-10-CM

## 2023-08-19 DIAGNOSIS — R000 Tachycardia, unspecified: Secondary | ICD-10-CM

## 2023-08-19 NOTE — Progress Notes (Unsigned)
Patient enrolled for [employer]/ [employer] to ship a 30 day cardiac event monitor to her address on file. 

## 2023-08-20 ENCOUNTER — Telehealth: Payer: Self-pay

## 2023-08-20 DIAGNOSIS — F5101 Primary insomnia: Secondary | ICD-10-CM | POA: Diagnosis not present

## 2023-08-20 DIAGNOSIS — F325 Major depressive disorder, single episode, in full remission: Secondary | ICD-10-CM | POA: Diagnosis not present

## 2023-08-20 DIAGNOSIS — R296 Repeated falls: Secondary | ICD-10-CM | POA: Diagnosis not present

## 2023-08-20 DIAGNOSIS — I4719 Other supraventricular tachycardia: Secondary | ICD-10-CM | POA: Diagnosis not present

## 2023-08-20 DIAGNOSIS — S42001K Fracture of unspecified part of right clavicle, subsequent encounter for fracture with nonunion: Secondary | ICD-10-CM | POA: Diagnosis not present

## 2023-08-20 DIAGNOSIS — G43009 Migraine without aura, not intractable, without status migrainosus: Secondary | ICD-10-CM | POA: Diagnosis not present

## 2023-08-20 DIAGNOSIS — M8000XD Age-related osteoporosis with current pathological fracture, unspecified site, subsequent encounter for fracture with routine healing: Secondary | ICD-10-CM | POA: Diagnosis not present

## 2023-08-20 DIAGNOSIS — I700 Atherosclerosis of aorta: Secondary | ICD-10-CM | POA: Diagnosis not present

## 2023-08-20 LAB — COMPREHENSIVE METABOLIC PANEL
ALT: 17 [IU]/L (ref 0–32)
AST: 24 [IU]/L (ref 0–40)
Albumin: 4.1 g/dL (ref 3.8–4.8)
Alkaline Phosphatase: 78 [IU]/L (ref 44–121)
BUN/Creatinine Ratio: 21 (ref 12–28)
BUN: 19 mg/dL (ref 8–27)
Bilirubin Total: 0.3 mg/dL (ref 0.0–1.2)
CO2: 24 mmol/L (ref 20–29)
Calcium: 8.6 mg/dL — ABNORMAL LOW (ref 8.7–10.3)
Chloride: 104 mmol/L (ref 96–106)
Creatinine, Ser: 0.9 mg/dL (ref 0.57–1.00)
Globulin, Total: 2.3 g/dL (ref 1.5–4.5)
Glucose: 74 mg/dL (ref 70–99)
Potassium: 3.9 mmol/L (ref 3.5–5.2)
Sodium: 143 mmol/L (ref 134–144)
Total Protein: 6.4 g/dL (ref 6.0–8.5)
eGFR: 68 mL/min/{1.73_m2} (ref >59)

## 2023-08-20 LAB — MAGNESIUM: Magnesium: 2.1 mg/dL (ref 1.6–2.3)

## 2023-08-20 NOTE — Telephone Encounter (Addendum)
Called patient regarding results. Patient had understanding of results.----- Message from Joni Reining sent at 08/20/2023  7:21 AM EST ----- I have reviewed the labs. They are normal.  No new recommendations. Will evaluate cardiac monitor once we have the results.

## 2023-08-25 ENCOUNTER — Other Ambulatory Visit (HOSPITAL_COMMUNITY): Payer: Self-pay

## 2023-08-25 DIAGNOSIS — R9431 Abnormal electrocardiogram [ECG] [EKG]: Secondary | ICD-10-CM

## 2023-08-25 DIAGNOSIS — M25511 Pain in right shoulder: Secondary | ICD-10-CM | POA: Diagnosis not present

## 2023-08-25 DIAGNOSIS — R000 Tachycardia, unspecified: Secondary | ICD-10-CM

## 2023-08-25 DIAGNOSIS — I440 Atrioventricular block, first degree: Secondary | ICD-10-CM

## 2023-09-03 ENCOUNTER — Other Ambulatory Visit: Payer: Self-pay

## 2023-09-03 ENCOUNTER — Other Ambulatory Visit (HOSPITAL_COMMUNITY): Payer: Self-pay

## 2023-09-03 DIAGNOSIS — M25511 Pain in right shoulder: Secondary | ICD-10-CM | POA: Diagnosis not present

## 2023-09-03 DIAGNOSIS — R296 Repeated falls: Secondary | ICD-10-CM | POA: Diagnosis not present

## 2023-09-03 MED ORDER — IBANDRONATE SODIUM 150 MG PO TABS
150.0000 mg | ORAL_TABLET | ORAL | 3 refills | Status: AC
  Filled 2023-09-03: qty 3, 90d supply, fill #0
  Filled 2024-04-19: qty 3, 90d supply, fill #1

## 2023-09-04 ENCOUNTER — Other Ambulatory Visit: Payer: Self-pay

## 2023-09-04 ENCOUNTER — Other Ambulatory Visit (HOSPITAL_COMMUNITY): Payer: Self-pay

## 2023-09-05 ENCOUNTER — Other Ambulatory Visit (HOSPITAL_COMMUNITY): Payer: Self-pay

## 2023-09-05 ENCOUNTER — Other Ambulatory Visit: Payer: Self-pay

## 2023-09-08 ENCOUNTER — Other Ambulatory Visit (HOSPITAL_COMMUNITY): Payer: Self-pay

## 2023-09-09 ENCOUNTER — Other Ambulatory Visit (HOSPITAL_COMMUNITY): Payer: Self-pay

## 2023-09-09 DIAGNOSIS — R296 Repeated falls: Secondary | ICD-10-CM | POA: Diagnosis not present

## 2023-09-09 DIAGNOSIS — M25511 Pain in right shoulder: Secondary | ICD-10-CM | POA: Diagnosis not present

## 2023-09-11 ENCOUNTER — Other Ambulatory Visit (HOSPITAL_COMMUNITY): Payer: Self-pay

## 2023-09-12 DIAGNOSIS — R296 Repeated falls: Secondary | ICD-10-CM | POA: Diagnosis not present

## 2023-09-12 DIAGNOSIS — M25511 Pain in right shoulder: Secondary | ICD-10-CM | POA: Diagnosis not present

## 2023-09-15 ENCOUNTER — Ambulatory Visit: Payer: Medicare HMO | Admitting: Cardiology

## 2023-09-15 DIAGNOSIS — M25511 Pain in right shoulder: Secondary | ICD-10-CM | POA: Diagnosis not present

## 2023-09-15 DIAGNOSIS — R296 Repeated falls: Secondary | ICD-10-CM | POA: Diagnosis not present

## 2023-09-19 DIAGNOSIS — M25511 Pain in right shoulder: Secondary | ICD-10-CM | POA: Diagnosis not present

## 2023-09-19 DIAGNOSIS — R296 Repeated falls: Secondary | ICD-10-CM | POA: Diagnosis not present

## 2023-09-22 DIAGNOSIS — M25511 Pain in right shoulder: Secondary | ICD-10-CM | POA: Diagnosis not present

## 2023-09-22 DIAGNOSIS — R296 Repeated falls: Secondary | ICD-10-CM | POA: Diagnosis not present

## 2023-09-25 ENCOUNTER — Other Ambulatory Visit: Payer: Self-pay

## 2023-09-25 ENCOUNTER — Encounter: Payer: Self-pay | Admitting: Oncology

## 2023-09-25 ENCOUNTER — Other Ambulatory Visit (HOSPITAL_COMMUNITY): Payer: Self-pay

## 2023-09-25 ENCOUNTER — Ambulatory Visit: Payer: Self-pay | Attending: Adult Health

## 2023-09-25 DIAGNOSIS — I440 Atrioventricular block, first degree: Secondary | ICD-10-CM

## 2023-09-25 DIAGNOSIS — R9431 Abnormal electrocardiogram [ECG] [EKG]: Secondary | ICD-10-CM

## 2023-09-25 DIAGNOSIS — R000 Tachycardia, unspecified: Secondary | ICD-10-CM

## 2023-09-26 ENCOUNTER — Telehealth: Payer: Self-pay

## 2023-09-26 NOTE — Telephone Encounter (Addendum)
 Patient returned call regarding results. Patient had understandingof results.----- Message from Lamarr Satterfield sent at 09/26/2023 12:38 PM EST ----- I have reviewed the Zio monitor.  This showed sinus bradycardia, sinus tachycardia, rare PAC's and PVC's will go over the monitor when I see her on follow up. NO significant bradycardia which would cause falling.

## 2023-09-26 NOTE — Telephone Encounter (Addendum)
 Called patient regarding results. Unknown party answered call but did not respond.----- Message from Lamarr Satterfield sent at 09/26/2023 12:38 PM EST ----- I have reviewed the Zio monitor.  This showed sinus bradycardia, sinus tachycardia, rare PAC's and PVC's will go over the monitor when I see her on follow up. NO significant bradycardia which would cause falling.

## 2023-09-29 NOTE — Progress Notes (Signed)
  Cardiology Office Note:  .   Date:  10/02/2023  ID:  Rebecca Marshall, DOB Feb 02, 1951, MRN [id number] PCP: Cleotilde Planas, MD  [facility] Providers Cardiologist:  Redell Shallow, MD  }   History of Present Illness: .   Rebecca Marshall is a 73 y.o. female with past medical history of atrial tachycardia, controlled on flecainide , hyperlipidemia, and fibromyalgia.  Last seen in the office on 08/18/2023, she is retired [employer].  She was continued on flecainide  as ordered after having Dr. Fernande review her EKG showing mildly prolonged QT interval.  I did place a 30-day ZIO monitor to evaluate for arrhythmias as she was having some complaints of dizziness and near syncope.  Cardiac monitor revealed sinus bradycardia, sinus tachycardia with rare PACs and PVCs.  No issues with pauses or significant bradycardia which would be causing near syncope or falls.  She comes today without any further complaints.  She has not had any dizziness near syncope or palpitations.  I went over her cardiac monitor results in the clinic to let her see the rhythms, the rates, as well as her highest, average, and lowest heart rates.  She does have a chronic first-degree AV block with a mildly prolonged QT interval.  As above Dr. Fernande did review this it did not have any concerns at this time.  ROS: As above otherwise negative  Studies Reviewed: SABRA      Zio Monitor: 09/25/2023 Sinus bradycardia, NSR, sinus tachycardia, rare PAC and PVC. Redell Shallow   Physical Exam:   VS:  BP 122/76 (BP Location: Right Arm, Patient Position: Sitting, Cuff Size: Normal)   Pulse 65   Ht 5' 7 (1.702 m)   Wt 186 lb (84.4 kg)   SpO2 96%   BMI 29.13 kg/m    Wt Readings from Last 3 Encounters:  10/02/23 186 lb (84.4 kg)  08/18/23 188 lb 12.8 oz (85.6 kg)  08/16/23 182 lb 15.7 oz (83 kg)    GEN: Well nourished, well developed in no acute distress NECK: No JVD; No carotid bruits CARDIAC: RRR, no murmurs, rubs, gallops RESPIRATORY:   Clear to auscultation without rales, wheezing or rhonchi  ABDOMEN: Soft, non-tender, non-distended EXTREMITIES:  No edema; No deformity   ASSESSMENT AND PLAN: .   Syncope and collapse: Patient states she did pass out and was told she had a heart rate of 19.  ZIO monitor did not reveal any pauses, significant bradycardia tachycardia or arrhythmias which may have caused this.  The patient did reviewed the ZIO monitor report with me at which that showed her each one of her rhythms her average heart rate, lowest heart rate and highest heart rate.  She verbalizes understanding.  Would like to copy this for her and send it although this was difficult due to our printer issues.  She did have a mildly prolonged QT interval that was reviewed by Dr. Fernande and not found to be concerning.  She is to continue the flecainide  as directed.      2.  Hypothyroidism: Followed by PCP.  3.  Hypercholesterolemia: Remains on rosuvastatin  5 mg daily.  Labs are followed by PCP.   Signed, Lamarr HERO. Jerilynn CHOL, ANP, AACC

## 2023-10-01 ENCOUNTER — Other Ambulatory Visit (HOSPITAL_COMMUNITY): Payer: Self-pay

## 2023-10-01 ENCOUNTER — Other Ambulatory Visit: Payer: Self-pay

## 2023-10-02 ENCOUNTER — Encounter: Payer: Self-pay | Admitting: Adult Health

## 2023-10-02 ENCOUNTER — Ambulatory Visit: Payer: PPO | Attending: Adult Health | Admitting: Adult Health

## 2023-10-02 ENCOUNTER — Encounter: Payer: Self-pay | Admitting: Oncology

## 2023-10-02 VITALS — BP 122/76 | HR 65 | Ht 67.0 in | Wt 186.0 lb

## 2023-10-02 DIAGNOSIS — E7800 Pure hypercholesterolemia, unspecified: Secondary | ICD-10-CM

## 2023-10-02 DIAGNOSIS — I480 Paroxysmal atrial fibrillation: Secondary | ICD-10-CM | POA: Diagnosis not present

## 2023-10-02 DIAGNOSIS — R55 Syncope and collapse: Secondary | ICD-10-CM

## 2023-10-02 NOTE — Patient Instructions (Signed)
Medication Instructions:  No changes *If you need a refill on your cardiac medications before your next appointment, please call your pharmacy*   Lab Work: No labs If you have labs (blood work) drawn today and your tests are completely normal, you will receive your results only by: MyChart Message (if you have MyChart) OR A paper copy in the mail If you have any lab test that is abnormal or we need to change your treatment, we will call you to review the results.   Testing/Procedures: No testing   Follow-Up: At [facility], you and your health needs are our priority.  As part of our continuing mission to provide you with exceptional heart care, we have created designated Provider Care Teams.  These Care Teams include your primary Cardiologist (physician) and Advanced Practice Providers (APPs -  Physician Assistants and Nurse Practitioners) who all work together to provide you with the care you need, when you need it.  We recommend signing up for the patient portal called "MyChart".  Sign up information is provided on this After Visit Summary.  MyChart is used to connect with patients for Virtual Visits (Telemedicine).  Patients are able to view lab/test results, encounter notes, upcoming appointments, etc.  Non-urgent messages can be sent to your provider as well.   To learn more about what you can do with MyChart, go to [email or web address].    Your next appointment:   1 year(s)  Provider:    Olga Millers, MD

## 2023-10-20 ENCOUNTER — Ambulatory Visit
Admission: RE | Admit: 2023-10-20 | Discharge: 2023-10-20 | Disposition: A | Discharge status: Home / Self Care | Payer: PPO | Source: Ambulatory Visit | Attending: Family Medicine | Admitting: Family Medicine

## 2023-10-20 DIAGNOSIS — Z1231 Encounter for screening mammogram for malignant neoplasm of breast: Secondary | ICD-10-CM

## 2023-11-14 ENCOUNTER — Other Ambulatory Visit (HOSPITAL_COMMUNITY): Payer: Self-pay

## 2023-11-20 ENCOUNTER — Other Ambulatory Visit (HOSPITAL_BASED_OUTPATIENT_CLINIC_OR_DEPARTMENT_OTHER): Payer: Self-pay

## 2023-11-20 ENCOUNTER — Other Ambulatory Visit: Payer: Self-pay

## 2023-11-20 MED ORDER — CREON 36000-114000 UNITS PO CPEP
72000.0000 [IU] | ORAL_CAPSULE | Freq: Three times a day (TID) | ORAL | 3 refills | Status: AC
  Filled 2023-11-20: qty 500, 84d supply, fill #0

## 2023-11-20 MED ORDER — FLECAINIDE ACETATE 150 MG PO TABS
150.0000 mg | ORAL_TABLET | Freq: Two times a day (BID) | ORAL | 3 refills | Status: AC
  Filled 2023-11-20: qty 180, 90d supply, fill #0
  Filled 2024-06-28: qty 180, 90d supply, fill #1
  Filled 2024-10-07: qty 180, 90d supply, fill #2

## 2023-11-20 MED ORDER — TRAZODONE HCL 100 MG PO TABS
300.0000 mg | ORAL_TABLET | Freq: Every day | ORAL | 3 refills | Status: AC
  Filled 2023-11-20 – 2024-02-17 (×3): qty 270, 90d supply, fill #0
  Filled 2024-06-02: qty 270, 90d supply, fill #1
  Filled 2024-10-07: qty 270, 90d supply, fill #2

## 2023-11-21 ENCOUNTER — Other Ambulatory Visit: Payer: Self-pay

## 2023-11-24 ENCOUNTER — Other Ambulatory Visit: Payer: Self-pay

## 2023-11-25 ENCOUNTER — Other Ambulatory Visit: Payer: Self-pay

## 2023-12-02 ENCOUNTER — Other Ambulatory Visit (HOSPITAL_BASED_OUTPATIENT_CLINIC_OR_DEPARTMENT_OTHER): Payer: Self-pay

## 2023-12-03 ENCOUNTER — Other Ambulatory Visit (HOSPITAL_BASED_OUTPATIENT_CLINIC_OR_DEPARTMENT_OTHER): Payer: Self-pay

## 2023-12-10 ENCOUNTER — Other Ambulatory Visit: Payer: Self-pay

## 2023-12-10 ENCOUNTER — Other Ambulatory Visit (HOSPITAL_COMMUNITY): Payer: Self-pay

## 2023-12-10 DIAGNOSIS — R1013 Epigastric pain: Secondary | ICD-10-CM | POA: Diagnosis not present

## 2023-12-10 DIAGNOSIS — R197 Diarrhea, unspecified: Secondary | ICD-10-CM | POA: Diagnosis not present

## 2023-12-10 DIAGNOSIS — K3184 Gastroparesis: Secondary | ICD-10-CM | POA: Diagnosis not present

## 2023-12-10 MED ORDER — METOCLOPRAMIDE HCL 5 MG PO TABS
5.0000 mg | ORAL_TABLET | Freq: Two times a day (BID) | ORAL | 0 refills | Status: AC
  Filled 2023-12-10: qty 60, 30d supply, fill #0

## 2023-12-15 ENCOUNTER — Other Ambulatory Visit (HOSPITAL_BASED_OUTPATIENT_CLINIC_OR_DEPARTMENT_OTHER): Payer: Self-pay

## 2023-12-18 DIAGNOSIS — Z6829 Body mass index (BMI) 29.0-29.9, adult: Secondary | ICD-10-CM | POA: Diagnosis not present

## 2023-12-18 DIAGNOSIS — K529 Noninfective gastroenteritis and colitis, unspecified: Secondary | ICD-10-CM | POA: Diagnosis not present

## 2023-12-29 ENCOUNTER — Other Ambulatory Visit (HOSPITAL_COMMUNITY): Payer: Self-pay

## 2023-12-29 DIAGNOSIS — L814 Other melanin hyperpigmentation: Secondary | ICD-10-CM | POA: Diagnosis not present

## 2023-12-29 DIAGNOSIS — L2989 Other pruritus: Secondary | ICD-10-CM | POA: Diagnosis not present

## 2023-12-29 DIAGNOSIS — R208 Other disturbances of skin sensation: Secondary | ICD-10-CM | POA: Diagnosis not present

## 2023-12-29 DIAGNOSIS — L538 Other specified erythematous conditions: Secondary | ICD-10-CM | POA: Diagnosis not present

## 2023-12-29 DIAGNOSIS — D225 Melanocytic nevi of trunk: Secondary | ICD-10-CM | POA: Diagnosis not present

## 2023-12-29 DIAGNOSIS — Z789 Other specified health status: Secondary | ICD-10-CM | POA: Diagnosis not present

## 2023-12-29 DIAGNOSIS — L820 Inflamed seborrheic keratosis: Secondary | ICD-10-CM | POA: Diagnosis not present

## 2023-12-29 DIAGNOSIS — L821 Other seborrheic keratosis: Secondary | ICD-10-CM | POA: Diagnosis not present

## 2023-12-30 ENCOUNTER — Other Ambulatory Visit (HOSPITAL_COMMUNITY): Payer: Self-pay

## 2023-12-30 MED ORDER — ROSUVASTATIN CALCIUM 5 MG PO TABS
5.0000 mg | ORAL_TABLET | Freq: Every day | ORAL | 3 refills | Status: AC
  Filled 2023-12-30: qty 90, 90d supply, fill #0
  Filled 2024-04-27: qty 90, 90d supply, fill #1
  Filled 2024-07-28: qty 90, 90d supply, fill #2
  Filled 2024-10-22: qty 90, 90d supply, fill #3

## 2023-12-30 MED ORDER — SERTRALINE HCL 100 MG PO TABS
200.0000 mg | ORAL_TABLET | Freq: Every day | ORAL | 0 refills | Status: DC
  Filled 2023-12-30: qty 180, 90d supply, fill #0

## 2024-01-06 DIAGNOSIS — R197 Diarrhea, unspecified: Secondary | ICD-10-CM | POA: Diagnosis not present

## 2024-01-07 ENCOUNTER — Encounter: Payer: Self-pay | Admitting: Oncology

## 2024-01-07 ENCOUNTER — Other Ambulatory Visit (HOSPITAL_COMMUNITY): Payer: Self-pay

## 2024-01-07 ENCOUNTER — Other Ambulatory Visit: Payer: Self-pay

## 2024-01-07 DIAGNOSIS — Z6829 Body mass index (BMI) 29.0-29.9, adult: Secondary | ICD-10-CM | POA: Diagnosis not present

## 2024-01-07 DIAGNOSIS — M62830 Muscle spasm of back: Secondary | ICD-10-CM | POA: Diagnosis not present

## 2024-01-07 MED ORDER — CYCLOBENZAPRINE HCL 10 MG PO TABS
10.0000 mg | ORAL_TABLET | Freq: Two times a day (BID) | ORAL | 0 refills | Status: AC
Start: 2024-01-07 — End: ?
  Filled 2024-01-07: qty 30, 15d supply, fill #0

## 2024-01-21 ENCOUNTER — Other Ambulatory Visit (HOSPITAL_COMMUNITY): Payer: Self-pay

## 2024-01-21 ENCOUNTER — Other Ambulatory Visit: Payer: Self-pay | Admitting: Gastroenterology

## 2024-01-21 DIAGNOSIS — R1013 Epigastric pain: Secondary | ICD-10-CM | POA: Diagnosis not present

## 2024-01-21 DIAGNOSIS — R197 Diarrhea, unspecified: Secondary | ICD-10-CM | POA: Diagnosis not present

## 2024-01-21 MED ORDER — DICYCLOMINE HCL 10 MG PO CAPS
10.0000 mg | ORAL_CAPSULE | Freq: Three times a day (TID) | ORAL | 0 refills | Status: DC
  Filled 2024-01-21: qty 60, 20d supply, fill #0

## 2024-01-29 ENCOUNTER — Ambulatory Visit
Admission: RE | Admit: 2024-01-29 | Discharge: 2024-01-29 | Disposition: A | Discharge status: Home / Self Care | Source: Ambulatory Visit | Attending: Gastroenterology | Admitting: Gastroenterology

## 2024-01-29 DIAGNOSIS — R1013 Epigastric pain: Secondary | ICD-10-CM | POA: Diagnosis not present

## 2024-01-29 DIAGNOSIS — R1012 Left upper quadrant pain: Secondary | ICD-10-CM | POA: Diagnosis not present

## 2024-01-29 MED ORDER — IOPAMIDOL (ISOVUE-300) INJECTION 61%
100.0000 mL | Freq: Once | INTRAVENOUS | Status: AC | PRN
  Administered 2024-01-29: 100 mL via INTRAVENOUS

## 2024-01-30 ENCOUNTER — Other Ambulatory Visit (HOSPITAL_COMMUNITY): Payer: Self-pay

## 2024-01-30 MED ORDER — DICYCLOMINE HCL 10 MG PO CAPS
10.0000 mg | ORAL_CAPSULE | Freq: Three times a day (TID) | ORAL | 3 refills | Status: AC
  Filled 2024-05-04: qty 90, 30d supply, fill #0
  Filled 2024-07-28: qty 90, 30d supply, fill #1
  Filled 2024-09-10: qty 90, 30d supply, fill #2

## 2024-02-17 ENCOUNTER — Other Ambulatory Visit (HOSPITAL_COMMUNITY): Payer: Self-pay

## 2024-02-17 ENCOUNTER — Other Ambulatory Visit: Payer: Self-pay

## 2024-02-23 ENCOUNTER — Other Ambulatory Visit: Payer: Self-pay

## 2024-02-23 ENCOUNTER — Other Ambulatory Visit (HOSPITAL_COMMUNITY): Payer: Self-pay

## 2024-02-23 DIAGNOSIS — E162 Hypoglycemia, unspecified: Secondary | ICD-10-CM | POA: Diagnosis not present

## 2024-02-23 DIAGNOSIS — E890 Postprocedural hypothyroidism: Secondary | ICD-10-CM | POA: Diagnosis not present

## 2024-02-23 DIAGNOSIS — C73 Malignant neoplasm of thyroid gland: Secondary | ICD-10-CM | POA: Diagnosis not present

## 2024-02-23 MED ORDER — AMOXICILLIN 500 MG PO CAPS
500.0000 mg | ORAL_CAPSULE | Freq: Three times a day (TID) | ORAL | 1 refills | Status: AC
  Filled 2024-02-23: qty 30, 10d supply, fill #0

## 2024-03-01 ENCOUNTER — Other Ambulatory Visit (HOSPITAL_COMMUNITY): Payer: Self-pay

## 2024-03-01 DIAGNOSIS — M899 Disorder of bone, unspecified: Secondary | ICD-10-CM | POA: Diagnosis not present

## 2024-03-01 DIAGNOSIS — E7800 Pure hypercholesterolemia, unspecified: Secondary | ICD-10-CM | POA: Diagnosis not present

## 2024-03-01 DIAGNOSIS — E890 Postprocedural hypothyroidism: Secondary | ICD-10-CM | POA: Diagnosis not present

## 2024-03-01 DIAGNOSIS — C73 Malignant neoplasm of thyroid gland: Secondary | ICD-10-CM | POA: Diagnosis not present

## 2024-03-01 DIAGNOSIS — E162 Hypoglycemia, unspecified: Secondary | ICD-10-CM | POA: Diagnosis not present

## 2024-03-01 MED ORDER — LEVOTHYROXINE SODIUM 150 MCG PO TABS
150.0000 ug | ORAL_TABLET | Freq: Every morning | ORAL | 5 refills | Status: DC
  Filled 2024-03-01: qty 30, 30d supply, fill #0
  Filled 2024-04-02: qty 30, 30d supply, fill #1
  Filled 2024-05-04: qty 30, 30d supply, fill #2
  Filled 2024-06-09: qty 30, 30d supply, fill #3

## 2024-03-02 ENCOUNTER — Other Ambulatory Visit (HOSPITAL_COMMUNITY): Payer: Self-pay

## 2024-03-02 ENCOUNTER — Other Ambulatory Visit: Payer: Self-pay

## 2024-03-02 ENCOUNTER — Encounter: Payer: Self-pay | Admitting: Pharmacist

## 2024-03-05 ENCOUNTER — Other Ambulatory Visit (HOSPITAL_COMMUNITY): Payer: Self-pay

## 2024-03-05 DIAGNOSIS — H353132 Nonexudative age-related macular degeneration, bilateral, intermediate dry stage: Secondary | ICD-10-CM | POA: Diagnosis not present

## 2024-03-05 DIAGNOSIS — H524 Presbyopia: Secondary | ICD-10-CM | POA: Diagnosis not present

## 2024-03-05 MED ORDER — RESTASIS 0.05 % OP EMUL
1.0000 [drp] | Freq: Two times a day (BID) | OPHTHALMIC | 3 refills | Status: AC
  Filled 2024-03-05 – 2024-09-10 (×2): qty 180, 90d supply, fill #0

## 2024-03-08 ENCOUNTER — Other Ambulatory Visit (HOSPITAL_COMMUNITY): Payer: Self-pay

## 2024-03-09 ENCOUNTER — Other Ambulatory Visit: Payer: Self-pay

## 2024-03-16 ENCOUNTER — Other Ambulatory Visit (HOSPITAL_COMMUNITY): Payer: Self-pay

## 2024-03-17 ENCOUNTER — Encounter: Payer: Self-pay | Admitting: Oncology

## 2024-03-17 ENCOUNTER — Other Ambulatory Visit (HOSPITAL_COMMUNITY): Payer: Self-pay

## 2024-03-17 MED ORDER — ELETRIPTAN HYDROBROMIDE 40 MG PO TABS
40.0000 mg | ORAL_TABLET | ORAL | 1 refills | Status: AC | PRN
  Filled 2024-03-17: qty 12, 6d supply, fill #0
  Filled 2024-03-22: qty 12, 12d supply, fill #0

## 2024-03-22 ENCOUNTER — Other Ambulatory Visit (HOSPITAL_COMMUNITY): Payer: Self-pay

## 2024-03-22 ENCOUNTER — Encounter: Payer: Self-pay | Admitting: Oncology

## 2024-03-24 ENCOUNTER — Other Ambulatory Visit: Payer: Self-pay

## 2024-03-24 ENCOUNTER — Other Ambulatory Visit (HOSPITAL_COMMUNITY): Payer: Self-pay

## 2024-03-24 MED ORDER — RIZATRIPTAN BENZOATE 10 MG PO TABS
10.0000 mg | ORAL_TABLET | Freq: Every day | ORAL | 0 refills | Status: AC
  Filled 2024-03-24: qty 18, 30d supply, fill #0
  Filled 2024-09-01: qty 12, 30d supply, fill #1

## 2024-04-02 ENCOUNTER — Other Ambulatory Visit (HOSPITAL_COMMUNITY): Payer: Self-pay

## 2024-04-08 ENCOUNTER — Ambulatory Visit (HOSPITAL_BASED_OUTPATIENT_CLINIC_OR_DEPARTMENT_OTHER)
Admission: RE | Admit: 2024-04-08 | Discharge: 2024-04-08 | Disposition: A | Discharge status: Home / Self Care | Source: Ambulatory Visit | Attending: Family Medicine | Admitting: Family Medicine

## 2024-04-08 ENCOUNTER — Other Ambulatory Visit: Payer: Medicare HMO

## 2024-04-08 DIAGNOSIS — Z13820 Encounter for screening for osteoporosis: Secondary | ICD-10-CM | POA: Diagnosis not present

## 2024-04-08 DIAGNOSIS — M8580 Other specified disorders of bone density and structure, unspecified site: Secondary | ICD-10-CM | POA: Diagnosis present

## 2024-04-08 DIAGNOSIS — M8589 Other specified disorders of bone density and structure, multiple sites: Secondary | ICD-10-CM | POA: Diagnosis not present

## 2024-04-08 DIAGNOSIS — Z780 Asymptomatic menopausal state: Secondary | ICD-10-CM | POA: Diagnosis not present

## 2024-04-19 ENCOUNTER — Other Ambulatory Visit (HOSPITAL_COMMUNITY): Payer: Self-pay

## 2024-04-27 ENCOUNTER — Other Ambulatory Visit (HOSPITAL_COMMUNITY): Payer: Self-pay

## 2024-04-27 ENCOUNTER — Other Ambulatory Visit: Payer: Self-pay

## 2024-04-27 MED ORDER — SERTRALINE HCL 100 MG PO TABS
200.0000 mg | ORAL_TABLET | Freq: Every day | ORAL | 0 refills | Status: DC
  Filled 2024-04-27: qty 180, 90d supply, fill #0

## 2024-04-29 DIAGNOSIS — H903 Sensorineural hearing loss, bilateral: Secondary | ICD-10-CM | POA: Diagnosis not present

## 2024-05-03 DIAGNOSIS — E162 Hypoglycemia, unspecified: Secondary | ICD-10-CM | POA: Diagnosis not present

## 2024-05-03 DIAGNOSIS — E890 Postprocedural hypothyroidism: Secondary | ICD-10-CM | POA: Diagnosis not present

## 2024-05-03 DIAGNOSIS — C73 Malignant neoplasm of thyroid gland: Secondary | ICD-10-CM | POA: Diagnosis not present

## 2024-05-04 ENCOUNTER — Other Ambulatory Visit (HOSPITAL_COMMUNITY): Payer: Self-pay

## 2024-05-19 ENCOUNTER — Other Ambulatory Visit (HOSPITAL_COMMUNITY): Payer: Self-pay

## 2024-05-26 ENCOUNTER — Other Ambulatory Visit: Payer: Self-pay

## 2024-05-26 ENCOUNTER — Other Ambulatory Visit (HOSPITAL_COMMUNITY): Payer: Self-pay

## 2024-05-26 ENCOUNTER — Encounter: Payer: Self-pay | Admitting: Oncology

## 2024-05-26 DIAGNOSIS — I4719 Other supraventricular tachycardia: Secondary | ICD-10-CM | POA: Diagnosis not present

## 2024-05-26 DIAGNOSIS — F3340 Major depressive disorder, recurrent, in remission, unspecified: Secondary | ICD-10-CM | POA: Diagnosis not present

## 2024-05-26 DIAGNOSIS — F5101 Primary insomnia: Secondary | ICD-10-CM | POA: Diagnosis not present

## 2024-05-26 DIAGNOSIS — G43009 Migraine without aura, not intractable, without status migrainosus: Secondary | ICD-10-CM | POA: Diagnosis not present

## 2024-05-26 DIAGNOSIS — E7800 Pure hypercholesterolemia, unspecified: Secondary | ICD-10-CM | POA: Diagnosis not present

## 2024-05-26 DIAGNOSIS — E663 Overweight: Secondary | ICD-10-CM | POA: Diagnosis not present

## 2024-05-26 DIAGNOSIS — M8580 Other specified disorders of bone density and structure, unspecified site: Secondary | ICD-10-CM | POA: Diagnosis not present

## 2024-05-26 DIAGNOSIS — N952 Postmenopausal atrophic vaginitis: Secondary | ICD-10-CM | POA: Diagnosis not present

## 2024-05-26 MED ORDER — TOPIRAMATE 50 MG PO TABS
50.0000 mg | ORAL_TABLET | Freq: Every day | ORAL | 3 refills | Status: AC
  Filled 2024-05-26: qty 90, 90d supply, fill #0
  Filled 2024-08-17: qty 90, 90d supply, fill #1
  Filled 2024-10-25: qty 90, 90d supply, fill #2

## 2024-05-26 MED ORDER — CLINDAMYCIN HCL 150 MG PO CAPS
150.0000 mg | ORAL_CAPSULE | Freq: Four times a day (QID) | ORAL | 1 refills | Status: AC
  Filled 2024-05-26: qty 28, 7d supply, fill #0

## 2024-05-26 MED ORDER — ELETRIPTAN HYDROBROMIDE 40 MG PO TABS
40.0000 mg | ORAL_TABLET | ORAL | 0 refills | Status: AC | PRN
  Filled 2024-05-26 (×3): qty 12, 30d supply, fill #0

## 2024-05-26 MED ORDER — ESTRADIOL 0.1 MG/GM VA CREA
0.5000 g | TOPICAL_CREAM | Freq: Every evening | VAGINAL | 2 refills | Status: AC
  Filled 2024-05-26: qty 42.5, 30d supply, fill #0

## 2024-05-27 ENCOUNTER — Other Ambulatory Visit: Payer: Self-pay

## 2024-05-28 ENCOUNTER — Other Ambulatory Visit (HOSPITAL_COMMUNITY): Payer: Self-pay

## 2024-06-01 ENCOUNTER — Other Ambulatory Visit: Payer: Self-pay

## 2024-06-01 ENCOUNTER — Encounter: Payer: Self-pay | Admitting: Oncology

## 2024-06-01 ENCOUNTER — Other Ambulatory Visit (HOSPITAL_COMMUNITY): Payer: Self-pay

## 2024-06-01 MED ORDER — CYCLOBENZAPRINE HCL 10 MG PO TABS
10.0000 mg | ORAL_TABLET | Freq: Every day | ORAL | 0 refills | Status: AC | PRN
  Filled 2024-06-01 – 2024-06-28 (×3): qty 30, 30d supply, fill #0

## 2024-06-02 ENCOUNTER — Other Ambulatory Visit: Payer: Self-pay

## 2024-06-02 ENCOUNTER — Encounter: Payer: Self-pay | Admitting: Pharmacist

## 2024-06-02 ENCOUNTER — Other Ambulatory Visit (HOSPITAL_COMMUNITY): Payer: Self-pay

## 2024-06-03 ENCOUNTER — Other Ambulatory Visit (HOSPITAL_COMMUNITY): Payer: Self-pay

## 2024-06-04 ENCOUNTER — Other Ambulatory Visit (HOSPITAL_COMMUNITY): Payer: Self-pay

## 2024-06-07 ENCOUNTER — Other Ambulatory Visit: Payer: Self-pay

## 2024-06-08 ENCOUNTER — Other Ambulatory Visit (HOSPITAL_COMMUNITY): Payer: Self-pay

## 2024-06-08 ENCOUNTER — Encounter (HOSPITAL_COMMUNITY): Payer: Self-pay

## 2024-06-09 ENCOUNTER — Other Ambulatory Visit (HOSPITAL_COMMUNITY): Payer: Self-pay

## 2024-06-11 ENCOUNTER — Other Ambulatory Visit (HOSPITAL_COMMUNITY): Payer: Self-pay

## 2024-06-14 ENCOUNTER — Other Ambulatory Visit (HOSPITAL_COMMUNITY): Payer: Self-pay

## 2024-06-14 MED ORDER — LEVOTHYROXINE SODIUM 150 MCG PO TABS
150.0000 ug | ORAL_TABLET | Freq: Every morning | ORAL | 5 refills | Status: AC
  Filled 2024-06-14 – 2024-07-05 (×2): qty 30, 30d supply, fill #0

## 2024-06-14 MED ORDER — LEVOTHYROXINE SODIUM 150 MCG PO TABS
150.0000 ug | ORAL_TABLET | Freq: Every morning | ORAL | 5 refills | Status: AC
  Filled 2024-06-14 – 2024-07-02 (×2): qty 30, 30d supply, fill #0
  Filled 2024-07-28: qty 30, 30d supply, fill #1

## 2024-06-15 ENCOUNTER — Other Ambulatory Visit: Payer: Self-pay

## 2024-06-15 ENCOUNTER — Other Ambulatory Visit (HOSPITAL_COMMUNITY): Payer: Self-pay

## 2024-06-15 DIAGNOSIS — M545 Low back pain, unspecified: Secondary | ICD-10-CM | POA: Diagnosis not present

## 2024-06-15 MED ORDER — MELOXICAM 7.5 MG PO TABS
7.5000 mg | ORAL_TABLET | Freq: Every day | ORAL | 1 refills | Status: AC | PRN
  Filled 2024-06-15 – 2024-06-16 (×2): qty 60, 30d supply, fill #0

## 2024-06-16 ENCOUNTER — Other Ambulatory Visit: Payer: Self-pay

## 2024-06-16 ENCOUNTER — Other Ambulatory Visit (HOSPITAL_COMMUNITY): Payer: Self-pay

## 2024-06-18 ENCOUNTER — Other Ambulatory Visit (HOSPITAL_COMMUNITY): Payer: Self-pay

## 2024-06-18 MED ORDER — TRAMADOL HCL 50 MG PO TABS
50.0000 mg | ORAL_TABLET | Freq: Four times a day (QID) | ORAL | 0 refills | Status: AC | PRN
  Filled 2024-06-18: qty 20, 5d supply, fill #0

## 2024-06-24 ENCOUNTER — Encounter: Payer: Self-pay | Admitting: Oncology

## 2024-06-24 ENCOUNTER — Other Ambulatory Visit: Payer: Self-pay

## 2024-06-24 ENCOUNTER — Other Ambulatory Visit (HOSPITAL_COMMUNITY): Payer: Self-pay

## 2024-06-24 MED ORDER — CYCLOBENZAPRINE HCL 5 MG PO TABS
5.0000 mg | ORAL_TABLET | Freq: Every evening | ORAL | 0 refills | Status: AC | PRN
  Filled 2024-06-24 – 2024-09-10 (×2): qty 60, 30d supply, fill #0

## 2024-06-26 ENCOUNTER — Other Ambulatory Visit (HOSPITAL_COMMUNITY): Payer: Self-pay

## 2024-06-26 DIAGNOSIS — M545 Low back pain, unspecified: Secondary | ICD-10-CM | POA: Diagnosis not present

## 2024-06-28 ENCOUNTER — Other Ambulatory Visit: Payer: Self-pay

## 2024-06-28 ENCOUNTER — Encounter: Payer: Self-pay | Admitting: Oncology

## 2024-06-28 ENCOUNTER — Other Ambulatory Visit (HOSPITAL_COMMUNITY): Payer: Self-pay

## 2024-06-29 ENCOUNTER — Other Ambulatory Visit: Payer: Self-pay

## 2024-07-02 ENCOUNTER — Other Ambulatory Visit: Payer: Self-pay

## 2024-07-02 ENCOUNTER — Other Ambulatory Visit (HOSPITAL_COMMUNITY): Payer: Self-pay

## 2024-07-05 ENCOUNTER — Other Ambulatory Visit (HOSPITAL_COMMUNITY): Payer: Self-pay

## 2024-07-06 ENCOUNTER — Other Ambulatory Visit (HOSPITAL_COMMUNITY): Payer: Self-pay

## 2024-07-06 MED ORDER — TRAMADOL HCL 50 MG PO TABS
50.0000 mg | ORAL_TABLET | Freq: Four times a day (QID) | ORAL | 0 refills | Status: AC | PRN
  Filled 2024-07-06: qty 20, 5d supply, fill #0

## 2024-07-09 ENCOUNTER — Encounter: Payer: Self-pay | Admitting: Oncology

## 2024-07-09 ENCOUNTER — Other Ambulatory Visit (HOSPITAL_COMMUNITY): Payer: Self-pay

## 2024-07-09 DIAGNOSIS — M5459 Other low back pain: Secondary | ICD-10-CM | POA: Diagnosis not present

## 2024-07-09 DIAGNOSIS — S32020D Wedge compression fracture of second lumbar vertebra, subsequent encounter for fracture with routine healing: Secondary | ICD-10-CM | POA: Diagnosis not present

## 2024-07-09 MED ORDER — CYCLOBENZAPRINE HCL 5 MG PO TABS
5.0000 mg | ORAL_TABLET | Freq: Every evening | ORAL | 2 refills | Status: AC | PRN
  Filled 2024-07-09 (×2): qty 60, 30d supply, fill #0

## 2024-07-09 MED ORDER — METHOCARBAMOL 500 MG PO TABS
500.0000 mg | ORAL_TABLET | Freq: Four times a day (QID) | ORAL | 2 refills | Status: AC | PRN
  Filled 2024-07-09 (×2): qty 60, 8d supply, fill #0
  Filled 2024-09-10: qty 60, 8d supply, fill #1

## 2024-07-09 MED ORDER — AMOXICILLIN 500 MG PO CAPS
500.0000 mg | ORAL_CAPSULE | Freq: Three times a day (TID) | ORAL | 1 refills | Status: AC
  Filled 2024-07-09: qty 30, 10d supply, fill #0

## 2024-07-12 ENCOUNTER — Other Ambulatory Visit (HOSPITAL_COMMUNITY): Payer: Self-pay

## 2024-07-12 ENCOUNTER — Other Ambulatory Visit: Payer: Self-pay

## 2024-07-28 ENCOUNTER — Other Ambulatory Visit (HOSPITAL_COMMUNITY): Payer: Self-pay

## 2024-07-28 DIAGNOSIS — L2989 Other pruritus: Secondary | ICD-10-CM | POA: Diagnosis not present

## 2024-07-28 DIAGNOSIS — L538 Other specified erythematous conditions: Secondary | ICD-10-CM | POA: Diagnosis not present

## 2024-07-28 DIAGNOSIS — Z789 Other specified health status: Secondary | ICD-10-CM | POA: Diagnosis not present

## 2024-07-28 DIAGNOSIS — L820 Inflamed seborrheic keratosis: Secondary | ICD-10-CM | POA: Diagnosis not present

## 2024-07-28 MED ORDER — LEVOTHYROXINE SODIUM 150 MCG PO TABS
150.0000 ug | ORAL_TABLET | Freq: Every morning | ORAL | 5 refills | Status: AC
  Filled 2024-07-28: qty 30, 30d supply, fill #0

## 2024-07-29 ENCOUNTER — Other Ambulatory Visit (HOSPITAL_COMMUNITY): Payer: Self-pay

## 2024-08-02 DIAGNOSIS — K529 Noninfective gastroenteritis and colitis, unspecified: Secondary | ICD-10-CM | POA: Diagnosis not present

## 2024-08-05 DIAGNOSIS — K529 Noninfective gastroenteritis and colitis, unspecified: Secondary | ICD-10-CM | POA: Diagnosis not present

## 2024-08-06 ENCOUNTER — Other Ambulatory Visit: Payer: Self-pay

## 2024-08-06 ENCOUNTER — Other Ambulatory Visit (HOSPITAL_COMMUNITY): Payer: Self-pay

## 2024-08-06 ENCOUNTER — Encounter: Payer: Self-pay | Admitting: Oncology

## 2024-08-06 DIAGNOSIS — S32020D Wedge compression fracture of second lumbar vertebra, subsequent encounter for fracture with routine healing: Secondary | ICD-10-CM | POA: Diagnosis not present

## 2024-08-06 DIAGNOSIS — M47816 Spondylosis without myelopathy or radiculopathy, lumbar region: Secondary | ICD-10-CM | POA: Diagnosis not present

## 2024-08-06 MED ORDER — CYCLOBENZAPRINE HCL 5 MG PO TABS
5.0000 mg | ORAL_TABLET | Freq: Every evening | ORAL | 2 refills | Status: AC | PRN
  Filled 2024-08-06: qty 60, 30d supply, fill #0

## 2024-08-17 ENCOUNTER — Other Ambulatory Visit (HOSPITAL_COMMUNITY): Payer: Self-pay

## 2024-08-17 ENCOUNTER — Other Ambulatory Visit: Payer: Self-pay

## 2024-08-17 MED ORDER — SERTRALINE HCL 100 MG PO TABS
200.0000 mg | ORAL_TABLET | Freq: Every day | ORAL | 1 refills | Status: AC
  Filled 2024-08-17: qty 180, 90d supply, fill #0

## 2024-09-01 ENCOUNTER — Other Ambulatory Visit (HOSPITAL_COMMUNITY): Payer: Self-pay

## 2024-09-01 ENCOUNTER — Other Ambulatory Visit: Payer: Self-pay

## 2024-09-09 ENCOUNTER — Other Ambulatory Visit (HOSPITAL_COMMUNITY): Payer: Self-pay

## 2024-09-09 MED FILL — Levothyroxine Sodium Tab 150 MCG: 150.0000 ug | ORAL | 90 days supply | Qty: 90 | Fill #0 | Status: CN

## 2024-09-10 ENCOUNTER — Other Ambulatory Visit: Payer: Self-pay

## 2024-09-10 ENCOUNTER — Encounter: Payer: Self-pay | Admitting: Oncology

## 2024-09-10 ENCOUNTER — Other Ambulatory Visit (HOSPITAL_COMMUNITY): Payer: Self-pay

## 2024-09-10 MED FILL — Levothyroxine Sodium Tab 150 MCG: 150.0000 ug | ORAL | 90 days supply | Qty: 90 | Fill #0 | Status: AC

## 2024-09-20 ENCOUNTER — Other Ambulatory Visit: Payer: Self-pay | Admitting: Family Medicine

## 2024-09-20 DIAGNOSIS — Z1231 Encounter for screening mammogram for malignant neoplasm of breast: Secondary | ICD-10-CM

## 2024-10-07 ENCOUNTER — Other Ambulatory Visit: Payer: Self-pay

## 2024-10-07 ENCOUNTER — Other Ambulatory Visit (HOSPITAL_COMMUNITY): Payer: Self-pay

## 2024-10-21 ENCOUNTER — Ambulatory Visit
Admission: RE | Admit: 2024-10-21 | Discharge: 2024-10-21 | Disposition: A | Discharge status: Home / Self Care | Source: Ambulatory Visit | Attending: Family Medicine | Admitting: Family Medicine

## 2024-10-21 DIAGNOSIS — Z1231 Encounter for screening mammogram for malignant neoplasm of breast: Secondary | ICD-10-CM

## 2024-10-22 ENCOUNTER — Other Ambulatory Visit (HOSPITAL_COMMUNITY): Payer: Self-pay
